# Patient Record
Sex: Female | Born: 1965 | Race: Black or African American | Hispanic: No | Marital: Single | Smoking: Never smoker
Health system: Southern US, Community
[De-identification: ages and names within clinical notes are randomized; demographics above are authoritative.]

## PROBLEM LIST (undated history)

## (undated) DIAGNOSIS — E785 Hyperlipidemia, unspecified: Secondary | ICD-10-CM

## (undated) DIAGNOSIS — K219 Gastro-esophageal reflux disease without esophagitis: Secondary | ICD-10-CM

## (undated) DIAGNOSIS — M1990 Unspecified osteoarthritis, unspecified site: Secondary | ICD-10-CM

## (undated) DIAGNOSIS — R1013 Epigastric pain: Secondary | ICD-10-CM

## (undated) DIAGNOSIS — I10 Essential (primary) hypertension: Secondary | ICD-10-CM

## (undated) DIAGNOSIS — G809 Cerebral palsy, unspecified: Secondary | ICD-10-CM

## (undated) DIAGNOSIS — T4145XA Adverse effect of unspecified anesthetic, initial encounter: Secondary | ICD-10-CM

## (undated) DIAGNOSIS — K449 Diaphragmatic hernia without obstruction or gangrene: Secondary | ICD-10-CM

## (undated) DIAGNOSIS — K5904 Chronic idiopathic constipation: Secondary | ICD-10-CM

## (undated) DIAGNOSIS — C801 Malignant (primary) neoplasm, unspecified: Secondary | ICD-10-CM

## (undated) HISTORY — PX: UPPER GASTROINTESTINAL ENDOSCOPY: SHX188

## (undated) HISTORY — DX: Diaphragmatic hernia without obstruction or gangrene: K44.9

## (undated) HISTORY — DX: Essential (primary) hypertension: I10

## (undated) HISTORY — PX: BREAST SURGERY: SHX581

## (undated) HISTORY — DX: Hyperlipidemia, unspecified: E78.5

## (undated) HISTORY — DX: Cerebral palsy, unspecified: G80.9

## (undated) HISTORY — DX: Chronic idiopathic constipation: K59.04

## (undated) HISTORY — DX: Gastro-esophageal reflux disease without esophagitis: K21.9

## (undated) HISTORY — PX: COLONOSCOPY: SHX174

## (undated) HISTORY — DX: Epigastric pain: R10.13

---

## 1997-09-07 ENCOUNTER — Other Ambulatory Visit: Admission: RE | Admit: 1997-09-07 | Discharge: 1997-09-07 | Payer: Self-pay | Admitting: Obstetrics & Gynecology

## 1999-07-10 ENCOUNTER — Other Ambulatory Visit: Admission: RE | Admit: 1999-07-10 | Discharge: 1999-07-10 | Payer: Self-pay | Admitting: Obstetrics & Gynecology

## 2000-06-26 ENCOUNTER — Other Ambulatory Visit: Admission: RE | Admit: 2000-06-26 | Discharge: 2000-06-26 | Payer: Self-pay | Admitting: Obstetrics and Gynecology

## 2000-09-25 ENCOUNTER — Ambulatory Visit (HOSPITAL_COMMUNITY): Admission: RE | Admit: 2000-09-25 | Discharge: 2000-09-25 | Payer: Self-pay | Admitting: *Deleted

## 2000-09-25 ENCOUNTER — Encounter: Payer: Self-pay | Admitting: *Deleted

## 2000-11-27 ENCOUNTER — Encounter: Payer: Self-pay | Admitting: Internal Medicine

## 2000-11-27 ENCOUNTER — Ambulatory Visit (HOSPITAL_COMMUNITY): Admission: RE | Admit: 2000-11-27 | Discharge: 2000-11-27 | Payer: Self-pay | Admitting: Internal Medicine

## 2001-02-04 ENCOUNTER — Ambulatory Visit (HOSPITAL_COMMUNITY): Admission: RE | Admit: 2001-02-04 | Discharge: 2001-02-04 | Payer: Self-pay | Admitting: *Deleted

## 2001-02-04 ENCOUNTER — Encounter: Payer: Self-pay | Admitting: *Deleted

## 2001-09-22 ENCOUNTER — Ambulatory Visit (HOSPITAL_COMMUNITY): Admission: RE | Admit: 2001-09-22 | Discharge: 2001-09-22 | Payer: Self-pay | Admitting: Internal Medicine

## 2001-09-22 ENCOUNTER — Encounter: Payer: Self-pay | Admitting: Internal Medicine

## 2001-10-08 ENCOUNTER — Ambulatory Visit (HOSPITAL_COMMUNITY): Admission: RE | Admit: 2001-10-08 | Discharge: 2001-10-08 | Payer: Self-pay | Admitting: Internal Medicine

## 2001-10-27 ENCOUNTER — Other Ambulatory Visit: Admission: RE | Admit: 2001-10-27 | Discharge: 2001-10-27 | Payer: Self-pay | Admitting: Obstetrics and Gynecology

## 2001-12-18 ENCOUNTER — Encounter: Payer: Self-pay | Admitting: Obstetrics and Gynecology

## 2001-12-18 ENCOUNTER — Encounter (INDEPENDENT_AMBULATORY_CARE_PROVIDER_SITE_OTHER): Payer: Self-pay | Admitting: Specialist

## 2001-12-18 ENCOUNTER — Ambulatory Visit (HOSPITAL_COMMUNITY): Admission: RE | Admit: 2001-12-18 | Discharge: 2001-12-18 | Payer: Self-pay | Admitting: Obstetrics and Gynecology

## 2002-11-01 ENCOUNTER — Other Ambulatory Visit: Admission: RE | Admit: 2002-11-01 | Discharge: 2002-11-01 | Payer: Self-pay | Admitting: Obstetrics and Gynecology

## 2002-12-21 ENCOUNTER — Ambulatory Visit (HOSPITAL_COMMUNITY): Admission: RE | Admit: 2002-12-21 | Discharge: 2002-12-21 | Payer: Self-pay | Admitting: Internal Medicine

## 2003-12-01 ENCOUNTER — Ambulatory Visit (HOSPITAL_COMMUNITY): Admission: RE | Admit: 2003-12-01 | Discharge: 2003-12-01 | Payer: Self-pay | Admitting: Internal Medicine

## 2004-02-16 ENCOUNTER — Other Ambulatory Visit: Admission: RE | Admit: 2004-02-16 | Discharge: 2004-02-16 | Payer: Self-pay | Admitting: Obstetrics and Gynecology

## 2004-02-23 ENCOUNTER — Ambulatory Visit (HOSPITAL_COMMUNITY): Admission: RE | Admit: 2004-02-23 | Discharge: 2004-02-23 | Payer: Self-pay | Admitting: Obstetrics and Gynecology

## 2004-03-22 ENCOUNTER — Emergency Department (HOSPITAL_COMMUNITY): Admission: EM | Admit: 2004-03-22 | Discharge: 2004-03-22 | Payer: Self-pay | Admitting: Emergency Medicine

## 2004-08-01 ENCOUNTER — Ambulatory Visit: Payer: Self-pay | Admitting: Internal Medicine

## 2004-08-03 ENCOUNTER — Ambulatory Visit (HOSPITAL_COMMUNITY): Admission: RE | Admit: 2004-08-03 | Discharge: 2004-08-03 | Payer: Self-pay | Admitting: Internal Medicine

## 2004-08-03 ENCOUNTER — Ambulatory Visit: Payer: Self-pay | Admitting: Internal Medicine

## 2004-10-31 ENCOUNTER — Ambulatory Visit: Payer: Self-pay | Admitting: Internal Medicine

## 2005-02-18 ENCOUNTER — Other Ambulatory Visit: Admission: RE | Admit: 2005-02-18 | Discharge: 2005-02-18 | Payer: Self-pay | Admitting: Obstetrics and Gynecology

## 2005-03-01 ENCOUNTER — Ambulatory Visit (HOSPITAL_COMMUNITY): Admission: RE | Admit: 2005-03-01 | Discharge: 2005-03-01 | Payer: Self-pay | Admitting: Internal Medicine

## 2005-05-13 DIAGNOSIS — K219 Gastro-esophageal reflux disease without esophagitis: Secondary | ICD-10-CM

## 2005-05-13 HISTORY — DX: Gastro-esophageal reflux disease without esophagitis: K21.9

## 2005-08-23 ENCOUNTER — Ambulatory Visit: Payer: Self-pay | Admitting: Internal Medicine

## 2005-08-27 ENCOUNTER — Ambulatory Visit (HOSPITAL_COMMUNITY): Admission: RE | Admit: 2005-08-27 | Discharge: 2005-08-27 | Payer: Self-pay | Admitting: Internal Medicine

## 2006-01-10 ENCOUNTER — Emergency Department (HOSPITAL_COMMUNITY): Admission: EM | Admit: 2006-01-10 | Discharge: 2006-01-10 | Payer: Self-pay | Admitting: Emergency Medicine

## 2007-06-19 ENCOUNTER — Ambulatory Visit: Payer: Self-pay | Admitting: Gastroenterology

## 2007-06-25 ENCOUNTER — Ambulatory Visit: Payer: Self-pay | Admitting: Gastroenterology

## 2007-06-25 ENCOUNTER — Ambulatory Visit (HOSPITAL_COMMUNITY): Admission: RE | Admit: 2007-06-25 | Discharge: 2007-06-25 | Payer: Self-pay | Admitting: Gastroenterology

## 2007-06-25 ENCOUNTER — Encounter: Payer: Self-pay | Admitting: Gastroenterology

## 2007-08-11 ENCOUNTER — Ambulatory Visit: Payer: Self-pay | Admitting: Gastroenterology

## 2007-08-19 ENCOUNTER — Ambulatory Visit (HOSPITAL_COMMUNITY): Admission: RE | Admit: 2007-08-19 | Discharge: 2007-08-19 | Payer: Self-pay | Admitting: Gastroenterology

## 2007-08-19 ENCOUNTER — Encounter: Payer: Self-pay | Admitting: Gastroenterology

## 2007-08-19 ENCOUNTER — Ambulatory Visit: Payer: Self-pay | Admitting: Gastroenterology

## 2007-09-30 ENCOUNTER — Emergency Department (HOSPITAL_COMMUNITY): Admission: EM | Admit: 2007-09-30 | Discharge: 2007-09-30 | Payer: Self-pay | Admitting: Emergency Medicine

## 2007-12-23 ENCOUNTER — Ambulatory Visit: Payer: Self-pay | Admitting: Gastroenterology

## 2008-04-28 ENCOUNTER — Ambulatory Visit: Payer: Self-pay | Admitting: Gastroenterology

## 2008-11-15 ENCOUNTER — Encounter: Payer: Self-pay | Admitting: Gastroenterology

## 2008-11-23 DIAGNOSIS — K219 Gastro-esophageal reflux disease without esophagitis: Secondary | ICD-10-CM | POA: Insufficient documentation

## 2008-11-23 DIAGNOSIS — K449 Diaphragmatic hernia without obstruction or gangrene: Secondary | ICD-10-CM

## 2008-11-23 DIAGNOSIS — K5900 Constipation, unspecified: Secondary | ICD-10-CM

## 2008-11-23 DIAGNOSIS — R1310 Dysphagia, unspecified: Secondary | ICD-10-CM

## 2008-11-23 DIAGNOSIS — I10 Essential (primary) hypertension: Secondary | ICD-10-CM | POA: Insufficient documentation

## 2008-11-23 DIAGNOSIS — G809 Cerebral palsy, unspecified: Secondary | ICD-10-CM

## 2008-11-23 HISTORY — DX: Diaphragmatic hernia without obstruction or gangrene: K44.9

## 2008-11-24 ENCOUNTER — Ambulatory Visit: Payer: Self-pay | Admitting: Gastroenterology

## 2009-02-13 ENCOUNTER — Encounter (HOSPITAL_COMMUNITY): Admission: RE | Admit: 2009-02-13 | Discharge: 2009-03-15 | Payer: Self-pay | Admitting: Internal Medicine

## 2009-05-11 ENCOUNTER — Encounter (INDEPENDENT_AMBULATORY_CARE_PROVIDER_SITE_OTHER): Payer: Self-pay | Admitting: *Deleted

## 2009-07-11 ENCOUNTER — Ambulatory Visit: Payer: Self-pay | Admitting: Gastroenterology

## 2009-07-11 DIAGNOSIS — K089 Disorder of teeth and supporting structures, unspecified: Secondary | ICD-10-CM | POA: Insufficient documentation

## 2009-09-13 ENCOUNTER — Ambulatory Visit: Payer: Self-pay | Admitting: Gastroenterology

## 2009-09-13 DIAGNOSIS — K921 Melena: Secondary | ICD-10-CM

## 2009-09-13 DIAGNOSIS — R1013 Epigastric pain: Secondary | ICD-10-CM | POA: Insufficient documentation

## 2009-09-14 ENCOUNTER — Encounter: Payer: Self-pay | Admitting: Gastroenterology

## 2009-09-14 ENCOUNTER — Ambulatory Visit: Payer: Self-pay | Admitting: Gastroenterology

## 2009-09-27 LAB — CONVERTED CEMR LAB
Basophils Absolute: 0.1 10*3/uL (ref 0.0–0.1)
Basophils Relative: 1 % (ref 0–1)
Eosinophils Absolute: 0.1 10*3/uL (ref 0.0–0.7)
Eosinophils Relative: 1 % (ref 0–5)
HCT: 39.3 % (ref 36.0–46.0)
Hemoglobin: 13.1 g/dL (ref 12.0–15.0)
Lymphocytes Relative: 35 % (ref 12–46)
Lymphs Abs: 2.8 10*3/uL (ref 0.7–4.0)
MCHC: 33.3 g/dL (ref 30.0–36.0)
MCV: 91.6 fL (ref 78.0–100.0)
Monocytes Absolute: 0.5 10*3/uL (ref 0.1–1.0)
Monocytes Relative: 6 % (ref 3–12)
Neutro Abs: 4.5 10*3/uL (ref 1.7–7.7)
Neutrophils Relative %: 57 % (ref 43–77)
Platelets: 382 10*3/uL (ref 150–400)
RBC: 4.29 M/uL (ref 3.87–5.11)
RDW: 13.6 % (ref 11.5–15.5)
WBC: 8 10*3/uL (ref 4.0–10.5)

## 2009-11-21 ENCOUNTER — Ambulatory Visit: Payer: Self-pay | Admitting: Gastroenterology

## 2009-11-21 DIAGNOSIS — D126 Benign neoplasm of colon, unspecified: Secondary | ICD-10-CM

## 2009-11-21 DIAGNOSIS — R109 Unspecified abdominal pain: Secondary | ICD-10-CM | POA: Insufficient documentation

## 2009-11-22 ENCOUNTER — Encounter: Payer: Self-pay | Admitting: Gastroenterology

## 2009-11-22 LAB — CONVERTED CEMR LAB
BUN: 14 mg/dL (ref 6–23)
Creatinine, Ser: 0.62 mg/dL (ref 0.40–1.20)

## 2009-11-24 ENCOUNTER — Encounter (INDEPENDENT_AMBULATORY_CARE_PROVIDER_SITE_OTHER): Payer: Self-pay | Admitting: *Deleted

## 2009-12-04 ENCOUNTER — Telehealth (INDEPENDENT_AMBULATORY_CARE_PROVIDER_SITE_OTHER): Payer: Self-pay

## 2009-12-04 ENCOUNTER — Encounter: Payer: Self-pay | Admitting: Gastroenterology

## 2009-12-04 DIAGNOSIS — R197 Diarrhea, unspecified: Secondary | ICD-10-CM

## 2009-12-14 ENCOUNTER — Telehealth (INDEPENDENT_AMBULATORY_CARE_PROVIDER_SITE_OTHER): Payer: Self-pay

## 2009-12-22 ENCOUNTER — Encounter: Payer: Self-pay | Admitting: Gastroenterology

## 2010-02-01 ENCOUNTER — Ambulatory Visit: Payer: Self-pay | Admitting: Gastroenterology

## 2010-03-14 ENCOUNTER — Encounter (INDEPENDENT_AMBULATORY_CARE_PROVIDER_SITE_OTHER): Payer: Self-pay | Admitting: *Deleted

## 2010-03-20 ENCOUNTER — Ambulatory Visit: Payer: Self-pay | Admitting: Gastroenterology

## 2010-04-18 ENCOUNTER — Encounter (HOSPITAL_COMMUNITY)
Admission: RE | Admit: 2010-04-18 | Discharge: 2010-05-18 | Payer: Self-pay | Source: Home / Self Care | Attending: Gastroenterology | Admitting: Gastroenterology

## 2010-06-03 ENCOUNTER — Encounter: Payer: Self-pay | Admitting: Gastroenterology

## 2010-06-03 ENCOUNTER — Encounter (INDEPENDENT_AMBULATORY_CARE_PROVIDER_SITE_OTHER): Payer: Self-pay | Admitting: Internal Medicine

## 2010-06-12 NOTE — Letter (Signed)
Summary: letter of appeal for CT  letter of appeal for CT   Imported By: Minna Merritts 11/22/2009 15:51:29  _____________________________________________________________________  External Attachment:    Type:   Image     Comment:   External Document

## 2010-06-12 NOTE — Assessment & Plan Note (Signed)
Summary: [name],[name]   Visit Type:  Follow-up Visit Primary Care Provider:  Felecia Shelling, M.D.  Chief Complaint:  F/U gerd/ dysphagia.  History of Present Illness: c/o chest and abd pain. Started on her Feb 10-sharp, chest and upr abd pain-sometimes feels onec a week. Not using TUMS or Maalox. HYQ:MVHQ. Has a sore in mouth. Drinking soft drinks. Can't eat cause she had a sore in her mouth for about a week. No cigs.   Current Medications (verified): 1)  Polyethylene Glycol 3350  Powd (Polyethylene Glycol 3350) .Marland KitchenMarland KitchenMarland Kitchen 17 Grams By Mouth Daily Prn 2)  Lisinopril 2.5 Mg Tabs (Lisinopril) .... Take 1/2 Tab Every Day 3)  Prilosec 20 Mg Cpdr (Omeprazole) .Marland Kitchen.. 1 By Mouth 30 Minutes Before Meals, Bid 4)  Cyclobenzaprine Hcl 10 Mg Tabs (Cyclobenzaprine Hcl) .... As Needed  Allergies (verified): No Known Drug Allergies  Past History:  Past Medical History: Last updated: 11/24/2008 TCS 2009: Simple adenoma GERD Cerebral palsy Consitipation HTN Hiatal Hernia EGD 2004/2006: 56 Fr Maloney dilation-no disruption EGD 2003: 54 Fr Maloney dilation-no disruption  Vital Signs:  Patient profile:   45 year old female Temp:     98.2 degrees F oral Pulse rate:   64 / minute BP sitting:   138 / 80  (left arm) Cuff size:   regular  Vitals Entered By: Cloria Spring LPN (July 12, 4694 2:56 PM)  Physical Exam  General:  Well developed, well nourished, no acute distress. Head:  Normocephalic and atraumatic. Eyes:  PERRLA, no icterus. Mouth:  poor dentition. impacted left lower wisdom tooth?   Lungs:  Clear throughout to auscultation. Heart:  Regular rate and rhythm; no murmurs Abdomen:  Soft, nontender and nondistended. Normal bowel sounds.  Impression & Recommendations:  Problem # 1:  GERD (ICD-530.81) Assessment Improved  Having mild brekthrough Sx associated most likely with dietary nonadherence. Add Viscous Lidocaine/Maalox/Mylanta as needed. Continue two times a day OMP. OPV in 2 mos. Could  consider addition of Baclofen if Sx worsen or repeat EGD.   Orders: Est. Patient Level IV (29528)  Problem # 2:  DENTAL PAIN (ICD-525.9) Assessment: New Pt likely has impacted wisdom tooth on left lower jaw. Pt should see Dr. Eber Jones Long ASAP to address issue.  CC: PCP Prescriptions: LIDOCAINE VISCOUS 2 % SOLN (LIDOCAINE HCL) 2 tsp with 4 tsp Maalox or Mylanta for breakthrough heartburn/indigestion BID  #200 ml x 5   Entered and Authorized by:   West Bali MD   Signed by:   West Bali MD on 07/11/2009   Method used:   [facility]ron[facility] to        [facility] [phone][facility] (retail)       [address]       [place], [place]  [address]       Ph: [phone] or [phone]       Fax: [phone]   RxID:   [id number]

## 2010-06-12 NOTE — Letter (Signed)
Summary: insurance denial  insurance denial   Imported By: Rosine Beat 12/22/2009 13:42:48  _____________________________________________________________________  External Attachment:    Type:   Image     Comment:   External Document

## 2010-06-12 NOTE — Assessment & Plan Note (Signed)
Summary: ABD  AND CHEST PAIN   Visit Type:  Follow-up Visit Primary Care Provider:  Felecia Shelling, M.D.  Chief Complaint:  follow up- doing good.  History of Present Illness: Still hurts in chest and stomach more than 1-2x/week. Not related to food. A little better than last time. No diarrhea or constipation.  No cough. Swallowing is alright. Nl BM every day. No milk, ice cream, or cheese. Eating yogurt daily for 1-2 weeks. No black sticky stools. Taking Prilosec two times a day. Pain is all over: twisting. Viscous Lidocaine helps pain ease off. c/o lower abd pain today. Will start vaginal suppositories for vaginal discharge.  Current Medications (verified): 1)  Polyethylene Glycol 3350  Powd (Polyethylene Glycol 3350) .Marland KitchenMarland KitchenMarland Kitchen 17 Grams By Mouth Daily Prn 2)  Lisinopril 2.5 Mg Tabs (Lisinopril) .... Take 1/2 Tab Every Day 3)  Prilosec 20 Mg Cpdr (Omeprazole) .Marland Kitchen.. 1 By Mouth 30 Minutes Before Meals, Bid 4)  Cyclobenzaprine Hcl 10 Mg Tabs (Cyclobenzaprine Hcl) .... As Needed 5)  Lidocaine Viscous 2 % Soln (Lidocaine Hcl) .... 2 Tsp With 4 Tsp Maalox or Mylanta For Breakthrough Heartburn/indigestion Bid  Allergies (verified): No Known Drug Allergies  Past History:  Past Medical History: TCS 2009: Simple adenoma GERD Cerebral palsy Constipation HTN Hiatal Hernia EGD 2/09-->Normal esophagus, multiple gastric polyps seen in the stomach, bx benign.  EGD 2004/2006: 56 Fr Maloney dilation-no disruption EGD 2003: 54 Fr Maloney dilation-no disruption  Family History: Reviewed history from 09/13/2009 and no changes required. Mother with h/o CRC diagnosed in late 68s. She also had kidney transplant. Father deceased due to accident.  Vital Signs:  Patient profile:   45 year old female Temp:     98.2 degrees F oral Pulse rate:   72 / minute BP sitting:   128 / 80  (left arm) Cuff size:   regular  Vitals Entered By: Hendricks Limes LPN (November 21, 2009 8:58 AM)  Physical Exam  General:  Well  developed, well nourished, no acute distress. Head:  Normocephalic and atraumatic. Mouth:  No deformity or lesions. Lungs:  Clear throughout to auscultation. Heart:  Regular rate and rhythm; no murmurs. Abdomen:  Soft, nontender and nondistended. Normal bowel sounds.  Impression & Recommendations:  Problem # 1:  EPIGASTRIC PAIN (ICD-789.06) 2o to lactose intolerance and/or IBS-c. Avoid dairy. Lactose free diet HO given. Add Probiotics daily. Samples given. Get CT Scan of abdomen and pelvis. MAY NEED TO ADD FIBER.  Return visit in 2 months.  Addendum [id number]: Dr. Melanee Spry denied pt the right to have a CT SCAN.  I will appeal his denial. I was given the FAX#: [phone] and I will write to appeal Dr. Wells Guiles denial. Appeal faxed today. Informed family that insurance denying the CT Scan. Recommended family call and complain.  Problem # 2:  GERD (ICD-530.81)  Continue Prilosec two times a day. If Sx not improved at next visit then will change to Nexium. Consider Baclofen.  CC: PCP  Orders: Est. Patient Level IV (91478)  Problem # 3:  ADENOMATOUS COLONIC POLYP (ICD-211.3) Assessment: Comment Only TCS 2014.  Other Orders: T-BUN ([id number]) T-Creatinine Blood [phone])  Patient Instructions: 1)  Avoid dairy. 2)  Continue Prilosec two times a day. 3)  Add Probiotics daily. 4)  Get CT Scan of abdomen and pelvis. 5)  Return visit in 2 months. 6)  The medication list was reviewed and reconciled.  All changed / newly prescribed medications were explained.  A complete medication list was provided  to the patient / caregiver.  Appended Document: ABD  AND CHEST PAIN pt aware of appt for 9/1 @ 10am w/SF  Appended Document: ABD  AND CHEST PAIN REMINDER APPOINTMENT IN THE COMPUTER

## 2010-06-12 NOTE — Progress Notes (Signed)
Summary: pt needs new containers for stool specimen  Phone Note Call from Patient   Caller: MS MALLOY/ CAREGIVER Summary of Call: Ms Marlynn Perking called and said between herself and the pt's sister, they got the lab order mixed up, assumed it was urine sample ( not stool sample). Requesting new containers, reviewed instructions again with her. Containers will be at the front desk. She will try to get sample today, tomorrow and Sat. Said pt is still having alot of abd pain.Marland KitchenMarland KitchenPt and her sister have discussed whether to do CT and pay....but she thinks they have decided to find out the results of the stool studies first. Per Ms Marlynn Perking, pt says her diarrhea is some better. She wants to know if she should try more solid foods now? Initial call taken by: Cloria Spring LPN,  December 14, 2009 11:21 AM

## 2010-06-12 NOTE — Letter (Signed)
Summary: Recall Office Visit  [facility]  [address]   [place], [place] [address]   Phone: [phone]  Fax: [phone]      March 14, 2010   [name] [address] [place], [place]  [address] 03-Oct-1965   Dear [name],   According to our records, it is time for you to schedule a follow-up office visit with [place].   At your convenience, please call [phone] to schedule an office visit. If you have any questions, concerns, or feel that this letter is in error, we would appreciate your call.   Sincerely,    Diana Eves  [facility] Ph: [phone]   Fax: [phone]

## 2010-06-12 NOTE — Assessment & Plan Note (Signed)
Summary: DROPPED OFF STOOL/SS  pt returned ifobt and it was negative  Allergies: No Known Drug Allergies  Other Orders: Immuno-chemical Fecal Occult ([id number]) 

## 2010-06-12 NOTE — Assessment & Plan Note (Signed)
Summary: DIARRHEA, GERD   Visit Type:  Follow-up Visit Primary Care Provider:  Felecia Shelling, M.D.  Chief Complaint:  F/U gerd/diarrhea.  History of Present Illness: Having 2 BMs a day-nl formed. No pain with pasing stool. Avoiding dairy and fried foods. Maybe 1-2x/wk has belly pain. Appetite: good. Heartburn: usu in the AM.   Current Medications (verified): 1)  Lisinopril 2.5 Mg Tabs (Lisinopril) .... Take 1/2 Tab Every Day 2)  Prilosec 20 Mg Cpdr (Omeprazole) .Marland Kitchen.. 1 By Mouth 30 Minutes Before Meals, Bid 3)  Cyclobenzaprine Hcl 10 Mg Tabs (Cyclobenzaprine Hcl) .... As Needed 4)  Dicyclomine Hcl 10 Mg Caps (Dicyclomine Hcl) .... Take 1 Tablet By Mouth Three Times A Day 30 Minutes Prior To Meals  Allergies (verified): No Known Drug Allergies  Past History:  Past Medical History: Last updated: 02/01/2010 GERD **EGD 2/09-->Normal esophagus, multiple gastric polyps seen in the stomach, bx benign.  DYSPHAGIA **EGD 2004/2006: 56 Fr Maloney dilation-no disruption, EGD 2003: 54 Fr Maloney dilation-no disruption Intermittent Constipation TCS 2009: Simple adenoma  Hiatal Hernia Cerebral palsy HTN  Past Surgical History: Last updated: 09/13/2009 Unremarkable  Family History: Last updated: 09/13/2009 Mother with h/o CRC diagnosed in late 41s. She also had kidney transplant. Father deceased due to accident.  Social History: Last updated: 09/13/2009 Lives with mother. She has caretaker ATC. Denies tob, alcohol, drug use.  Vital Signs:  Patient profile:   45 year old female Temp:     97.3 degrees F oral Pulse rate:   80 / minute BP sitting:   100 / 70  (left arm)  Physical Exam  General:  Well developed, well nourished, no acute distress. Head:  Normocephalic and atraumatic. Lungs:  Clear throughout to auscultation. Heart:  Regular rate and rhythm; no murmurs. Abdomen:  Soft, nontender and nondistended. Normal bowel sounds. PT EXAMINED IN WHEELCHAIR.  Impression &  Recommendations:  Problem # 1:  DIARRHEA (ICD-787.91) Assessment Improved 2o to lactose intolerance +/- IBS-d. Continue to avoid dairy. Use lacatse if dairy is consumed. Continue Dicyclomine three times a day. If you start to have hard stool or constipation, take only twice daily. Follow up in 6 mos.  Problem # 2:  GERD (ICD-530.81) Assessment: Improved Contiue to avoid fried foods. Continue Omeprazole two times a day 30 minutes before meals. Add 1-2 TUMS at bedtime to help with AM acid.  CC: PCP  Patient Instructions: 1)  Continue Omeprazole two times a day 30 minutes before meals. 2)  Add 1-2 TUMS at bedtime to help with AM acid. 3)  Continue Dicyclomine hre times a day. If you start to have hard stool or constipation, take only twice daily. 4)  Follow up in 6 mos. 5)  The medication list was reviewed and reconciled.  All changed / newly prescribed medications were explained.  A complete medication list was provided to the patient / caregiver. Prescriptions: MOTORIZED WHEELCHAIR use as directed  #1 x 0   Entered and Authorized by:   West Bali MD   Signed by:   West Bali MD on 03/20/2010   Method used:   Print then Give to Patient   RxID:   [id number]   Appended Document: Orders Update    Clinical Lists Changes  Orders: Added new Service order of Est. Patient Level III (23557) - Signed      Appended Document: DIARRHEA, GERD 6 MONTH F/U OPV IS IN THE COMPUTER

## 2010-06-12 NOTE — Letter (Signed)
Summary: CT order  Internal Other   Imported By: Peggyann Shoals 11/21/2009 09:49:53  _____________________________________________________________________  External Attachment:    Type:   Image     Comment:   External Document

## 2010-06-12 NOTE — Miscellaneous (Signed)
Summary: Orders Update  Clinical Lists Changes  Problems: Added new problem of DIARRHEA (ICD-787.91) Orders: Added new Test order of T-Stool Giardia / Crypto- EIA (09811) - Signed Added new Test order of T-Fecal WBC ([id number]) - Signed Added new Test order of T-Culture, C-Diff Toxin A/B ([id number]) - Signed Added new Test order of T-Culture, C-Diff Toxin A/B ([id number]) - Signed Added new Test order of T-Culture, C-Diff Toxin A/B ([id number]) - Signed

## 2010-06-12 NOTE — Progress Notes (Signed)
Summary: phone note/abd/chest pain and diarrhea  Phone Note Call from Patient   Caller: Mrs. Malloy/ Caregiver Summary of Call: Received T/C from Mrs. Malloy, said pt is having some more abd pain. Had a bad episode on Sun with abd/chest pain... got very hot and dizzy and had some diarrhea. Wants to know what to do now, insurance would not cover to have CT. Please advise! Initial call taken by: Cloria Spring LPN,  December 04, 2009 9:15 AM     Appended Document: phone note/abd/chest pain and diarrhea please call pt. She may be having an episode of acute gastroenteritis. Pt needs to follow a soft low residue/lactose free diet for 7 days. They may pick up a HO. Submit loose stool for CDIffx3, fecal lactoferrin, and Giardia Ag. May use Imodium as needed for abd cramps and diarrhea. No more than 3 pills a day. Drink 1.5Ls of water daily. Will re-order CT Scan, Dx: severe abd pain and diarrhea.  Appended Document: phone note/abd/chest pain and diarrhea Tylenol or Ibuprofen OTC 200 mg qid prn abd pain.  Appended Document: phone note/abd/chest pain and diarrhea Called care-giver @ [phone]...and got Keryn's Mom. She said caregiver is gone and will not return til about 3:00 PM today. I gave her all of the information and told her to have Mrs. Malloy call me and i will explain to her also. Orders/ stool containers and diet at front for pick-up.  Appended Document: phone note/abd/chest pain and diarrhea LMOM for Mrs. Malloy to call.  Appended Document: phone note/abd/chest pain and diarrhea LMOM for Ms. Malloy to call.  Appended Document: phone note/abd/chest pain and diarrhea Spoke to Mrs Marlynn Perking. She will come by Wed AM to pick up containers, etc. She said pt describes her pain as very severe when it hits hurt kind of suddenly....then it wears down for awhile.  Appended Document: phone note/abd/chest pain and diarrhea Left message for Mrs. Mallory to call me RE: scheduling of [place]'s CT and  insurance associated issues.  Appended Document: phone note/abd/chest pain and diarrhea [employer] rep Mrs. Katrinka Blazing told me that a new precertification for a CPT code that had been denied within the past 45 days could not be precerted again even if it was a new DX or new onset of SX.  We would need to do an appeal or a restart; I explained that we had already done that and that the pt could have developed a new issue as this was a new CT being ordered for new severe SX not associated with the first CT.  I need to speak with caregiver and in another append mentioned I left a VM for her to call back to determine if they choose to proceed.  Otherwise, the CT will not be covered this time and Dr. Darrick Penna has been made aware and awaits results of stool studies ordered in conjunction.  Appended Document: phone note/abd/chest pain and diarrhea Ms. Malloy came by this AM to pick up containers for stool samples for pt. I gave her some of the information Angelica Chessman mentioned about the CT, she would like a call from [employer] at the above number.  Appended Document: phone note/abd/chest pain and diarrhea Spoke w/ Ms Marlynn Perking about [employer] not giving preauth for second CT, just like the first.  I explained that she can discuss with family and determine to proceed, that it didn't mean she couldn't have the CT, just that [employer] would not allow [place] to preauth a second CT while there is a CPT  that has been denied can not be given.  She will get back to [place] w/ decision and will complete stool studies as ordered.  Appended Document: phone note/abd/chest pain and diarrhea Please call pt. She should still avoid dairy but may add other things to her diet. Will await stools studies.  Appended Document: phone note/abd/chest pain and diarrhea tried to call pt- NA  Appended Document: phone note/abd/chest pain and diarrhea Spoke with Ms. Malloy. Informed of the above. Pt completed the stool samples, they are  taking to the lab this  AM.

## 2010-06-12 NOTE — Assessment & Plan Note (Signed)
Summary: GERD, DIARRHEA   Visit Type:  Follow-up Visit Primary Care Elane Peabody:  Felecia Shelling, M.D.   Chief Complaint:  F/U abd pain.  History of Present Illness: Pain in middle of week. Yesterday she was hurting-[employer], [employer], french fries. No problems swallowing. Good Bms: soft bu today watery: dark green. BM: soft yesterday. No blood in stool.  No nausea or vomiting. Happening 1-2x/mo. Not eating cheese or dairy. Ice cream caused loose stools. Did not taek suppositories. Yellow stool like discharge and it's slimy.  Current Medications (verified): 1)  Polyethylene Glycol 3350  Powd (Polyethylene Glycol 3350) .Marland KitchenMarland KitchenMarland Kitchen 17 Grams By Mouth Daily Prn 2)  Lisinopril 2.5 Mg Tabs (Lisinopril) .... Take 1/2 Tab Every Day 3)  Prilosec 20 Mg Cpdr (Omeprazole) .Marland Kitchen.. 1 By Mouth 30 Minutes Before Meals, Bid 4)  Cyclobenzaprine Hcl 10 Mg Tabs (Cyclobenzaprine Hcl) .... As Needed 5)  Dicyclomine Hcl 10 Mg Caps (Dicyclomine Hcl) .... Take 1 Tablet By Mouth Two Times A Day  Allergies (verified): No Known Drug Allergies  Past History:  Past Medical History: GERD **EGD 2/09-->Normal esophagus, multiple gastric polyps seen in the stomach, bx benign.  DYSPHAGIA **EGD 2004/2006: 56 Fr Maloney dilation-no disruption, EGD 2003: 54 Fr Maloney dilation-no disruption Intermittent Constipation TCS 2009: Simple adenoma  Hiatal Hernia Cerebral palsy HTN  Review of Systems       AUG 2011: FECAL LACTOFERRIN POS, CDIFF negx3, Giardia Ag neg  Vital Signs:  Patient profile:   45 year old female Height:      62 inches Temp:     97.6 degrees F oral Pulse rate:   68 / minute BP sitting:   134 / 70  (left arm) Cuff size:   regular  Vitals Entered By: Cloria Spring LPN (February 01, 2010 9:20 AM)  Physical Exam  General:  Well developed, well nourished, no acute distress. Head:  Normocephalic and atraumatic. Lungs:  Clear throughout to auscultation. Heart:  Regular rate and rhythm; no murmurs. Abdomen:  Soft,  nontender and nondistended. Normal bowel sounds. PT EXAMINED IN WHEELCHAIR. Psych:  Alert and cooperative. Normal mood and affect.  Impression & Recommendations:  Problem # 1:  GERD (ICD-530.81) Sx not ideally controlled due to consumption of high fat food. Avoid fried foods. Continue Omeprazole. Follow up in 2 mos. Would add Baclofen if pt having Sx daily.  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 Pt should avoid dairy. If wants ice cream or dairy, give lactase with her meal. ADD PHILLIP'S COLON HEALTH DAILY. Increase to Dicyclomine to three times a day 30 minutes before meals. Use Miralax as needed. Stop using it daily. Follow up in 2 mos.  CC: PCP  Patient Instructions: 1)  Pt should avoid dairy.  2)  If wants ice cream or dairy, give lactase with her meal. 3)  Avoid fried foods. 4)  Continue Omeprazole. 5)  ADD PHILLIP'S COLON HEALTH DAILY. 6)  Increase to Dicyclomine to three times a day 30 minutes before meals. 7)  Use Miralax as needed. Stop using it daily. 8)  Follow up in 2 mos. 9)  The medication list was reviewed and reconciled.  All changed / newly prescribed medications were explained.  A complete medication list was provided to the patient / caregiver. Prescriptions: DICYCLOMINE HCL 10 MG CAPS (DICYCLOMINE HCL) Take 1 tablet by mouth three times a day 30 minutes prior to meals  #90 x 11   Entered and Authorized by:   Duncan Dull  Loreen Freud MD   Signed by:   West Bali MD on 02/01/2010   Method used:   [facility] to        [facility] [phone]* (retail)       [address]       [place], [place]  [address]       Ph: [phone] or [phone]       Fax: [phone]   RxID:   [id number]   Appended Document: GERD, DIARRHEA F/U OPV IS IN THE COMPUTER  Appended Document: Orders Update    Clinical Lists Changes  Orders: Added new Service order of Est. Patient Level IV (62831) -  Signed

## 2010-06-12 NOTE — Letter (Signed)
Summary: waiting for preauthorization  [name] was contacted by  insurance and confirmed  that in association of her insurance would take a bi t to be sure she had the MD (ordering, and reviewing) to send in clinicals or MD-toMD review for which Dr. Darrick Penna.  He further deniedthe study and we f/u as necessary to achieve results for the patient. Can see appear letter and other stages through EMR.

## 2010-06-12 NOTE — Letter (Signed)
Summary: PT ORDER  PT ORDER   Imported By: Ave Filter 03/20/2010 15:57:07  _____________________________________________________________________  External Attachment:    Type:   Image     Comment:   External Document  Appended Document: PT ORDER Pt has an appt. 04/18/10.

## 2010-06-12 NOTE — Assessment & Plan Note (Signed)
Summary: FU 2MOS,GERD,DYSPHAGIA.GU   Visit Type:  f/u Primary Care Provider:  Fanta  Chief Complaint:  follow up gerd and abd pain.  History of Present Illness: [name] is here for f/u. Last seen 3/11. She has h/o GERD, dysphagia, chest and abd pain. She was doing okay until yesterday. She started having mid-abd pain, crampy. She has small bowel movement. Denies n/v, heartburn, chest pain. Appetite is good. Denies carbonated beverages, fried/greasy foods. She is taking Viscous lidocaine with maalox or mylanta at bedtime. She is most concerned about black, sticky stools she's had the past two weeks.    Current Medications (verified): 1)  Polyethylene Glycol 3350  Powd (Polyethylene Glycol 3350) .Marland KitchenMarland KitchenMarland Kitchen 17 Grams By Mouth Daily Prn 2)  Lisinopril 2.5 Mg Tabs (Lisinopril) .... Take 1/2 Tab Every Day 3)  Prilosec 20 Mg Cpdr (Omeprazole) .Marland Kitchen.. 1 By Mouth 30 Minutes Before Meals, Bid 4)  Cyclobenzaprine Hcl 10 Mg Tabs (Cyclobenzaprine Hcl) .... As Needed 5)  Lidocaine Viscous 2 % Soln (Lidocaine Hcl) .... 2 Tsp With 4 Tsp Maalox or Mylanta For Breakthrough Heartburn/indigestion Bid  Allergies (verified): No Known Drug Allergies  Past History:  Past Medical History: TCS 2009: Simple adenoma GERD Cerebral palsy Consitipation HTN Hiatal Hernia EGD 2/09-->Normal esophagus, multiple gastric polyps seen in the stomach, bx benign.  EGD 2004/2006: 56 Fr Maloney dilation-no disruption EGD 2003: 54 Fr Maloney dilation-no disruption  Past Surgical History: Unremarkable  Family History: Mother with h/o CRC diagnosed in late 41s. She also had kidney transplant. Father deceased due to accident.  Social History: Lives with mother. She has caretaker ATC. Denies tob, alcohol, drug use.  Review of Systems General:  Denies fever, chills, sweats, anorexia, fatigue, weakness, and weight loss. Eyes:  Denies vision loss. ENT:  Denies nasal congestion, sore throat, hoarseness, and difficulty  swallowing. CV:  Denies chest pains, angina, palpitations, dyspnea on exertion, and peripheral edema. Resp:  Denies dyspnea at rest, dyspnea with exercise, and cough. GI:  See HPI. GU:  Denies urinary burning and blood in urine. MS:  Denies joint pain / LOM. Derm:  Denies rash and itching. Neuro:  Denies weakness, frequent headaches, memory loss, and confusion. Psych:  Denies depression and anxiety. Endo:  Denies unusual weight change. Heme:  Denies bruising and bleeding. Allergy:  Denies hives and rash.  Vital Signs:  Patient profile:   45 year old female Temp:     98.2 degrees F oral Pulse rate:   68 / minute BP sitting:   118 / 80  (right arm) Cuff size:   regular  Vitals Entered By: Hendricks Limes LPN (Sep 13, 1608 2:40 PM)  Physical Exam  General:  Well developed, well nourished, no acute distress. Head:  Normocephalic and atraumatic. Eyes:  Conjunctivae pink, no scleral icterus.  Mouth:  Oropharyngeal mucosa moist, pink.  No lesions, erythema or exudate.    Neck:  Supple; no masses or thyromegaly. Lungs:  Clear throughout to auscultation. Heart:  Regular rate and rhythm; no murmurs, rubs,  or bruits. Abdomen:  Soft, nontender and nondistended. Normal bowel sounds. Extremities:  No clubbing, cyanosis, edema or deformities noted. Neurologic:  Alert and oriented. Skin:  Intact without significant lesions or rashes. Psych:  Alert and cooperative. Normal mood and affect.  Impression & Recommendations:  Problem # 1:  GERD (ICD-530.81)  Breakthrough symptoms better with dietary changes. She continues to have intermittent vague mid-abdominal pain. Now with c/o black sticky stools for two weeks. Clinically no evidence of significant  anemia. Some Maalox products can cause stools to turn black and it is not clear if her viscous lidocaine is mixed with Maalox or Mylanta. Will check CBC and ifobt. Last EGD in 2009. Will decide if she needs repeat EGD based on findings. Caretaker is  aware to look for malodorous black stools, fatigue, SOB, etc.    Orders: Est. Patient Level III (16109)  Other Orders: T-CBC w/Diff ([id number]) T-CBC w/Diff ([id number])

## 2010-09-11 ENCOUNTER — Other Ambulatory Visit: Payer: Self-pay | Admitting: Family Medicine

## 2010-09-14 ENCOUNTER — Ambulatory Visit (HOSPITAL_COMMUNITY)
Admission: RE | Admit: 2010-09-14 | Discharge: 2010-09-14 | Disposition: A | Discharge status: Home / Self Care | Payer: PRIVATE HEALTH INSURANCE | Source: Ambulatory Visit | Attending: Family Medicine | Admitting: Family Medicine

## 2010-09-14 DIAGNOSIS — R109 Unspecified abdominal pain: Secondary | ICD-10-CM | POA: Insufficient documentation

## 2010-09-20 ENCOUNTER — Ambulatory Visit: Payer: PRIVATE HEALTH INSURANCE | Admitting: Gastroenterology

## 2010-09-25 NOTE — H&P (Signed)
NAMECORAYMA, CASHATT                  ACCOUNT NO.:  [id number]   MEDICAL RECORD NO.:  [id number]           PATIENT TYPE:  AMB   LOCATION:  DAY                           FACILITY:  [facility]   PHYSICIAN:  Kassie Mends, M.D.      DATE OF BIRTH:  23-Sep-1965   DATE OF ADMISSION:  06/19/2007  DATE OF DISCHARGE:  LH                              HISTORY & PHYSICAL   CHIEF COMPLAINT:  Difficulty swallowing.   HISTORY OF PRESENT ILLNESS:  [name] is a pleasant 45 year old lady  with a  history of cerebral palsy who presents for further evaluation of  dysphagia.  She states she has increasing difficulty swallowing just  about all solids and pills.  She really does not perceive any problems  with liquids.  She also has chronic GERD which is somewhat refractory at  times.  he is currently on AcipHex 20 mg daily. She has chronic  constipation.  She only has a bowel movement when she takes Colon  Cleanse which is usually about three times a week.  She states her  stools are pretty dark.  She really does not describe them as black and  tarry however.  No blood in the stool.  She generally has a bowel  movement three times a week.  She complains of some epigastric pain.   CURRENT MEDICATIONS:  1. Aciphex 20 mg daily.  2. Colon Cleanse 3 tablets t.i.d.  3. Multivitamin daily.  4. Vitamin B6 daily.  5. Vitamin B12 daily.  6. Lisinopril daily.   ALLERGIES:  No known drug allergies.   PAST MEDICAL HISTORY:  Chronic GERD, cerebral palsy, hypertension. She  has had multiple EGDs for dysphagia. Back in 2003, she had a small  sliding hiatal hernia with serrated or wavy GE junction. The esophagus  was empirically dilated with a 54 [place] Maloney dilator. She had  another EGD in 2004 with similar findings. The esophagus dilated with 56  French Maloney dilator. Last EGD March 2006. She had a small sliding  hiatal hernia, again with serrated GE junction. A 56 French Maloney  dilator passed but did not induce any  mucosal disruption of the  esophagus. In the past, she has received dilatation to help her  symptoms.   PAST SURGICAL HISTORY:  Negative.   FAMILY HISTORY:  Mother is 31.  She has a history of colorectal cancer  diagnosed in her late 84s.  She also has had a kidney transplant. Father  deceased secondary to accident.   SOCIAL HISTORY:  She lives with her mother. She has a caretaker around-  the-clock. She is disabled.  Denies tobacco, alcohol or drug use.   REVIEW OF SYSTEMS:  See HPI for GI. CONSTITUTIONAL:  No weight loss.  Cardiopulmonary: No chest pain or shortness of breath. GENITOURINARY:  No dysuria or hematuria.   PHYSICAL EXAM:  Unable to obtain. This patient is in a wheelchair.  Blood pressure 138/96, pulse 72, temperature 98.6.  GENERAL:  Pleasant, well-nourished, well-developed, black female in no  acute distress.  She does have some involuntary tremors.  She is  wheelchair bound.  SKIN:  Warm and dry.  No jaundice.  HEENT: Sclerae nonicteric.  Oropharyngeal mucosa moist and pink.  No  lesions, erythema or exudate.  CHEST:  Lungs are clear auscultation.  CARDIAC:  Reveals a regular rate and rhythm.  ABDOMEN:  Positive bowel sounds, soft, nontender, nondistended.  No  organomegaly or masses.  No rebound or guarding.  No abdominal bruits or  hernias appreciated but limited due to being examined in the wheelchair.  LOWER EXTREMITIES:  No edema.   IMPRESSION:  Ms. Toda is a 45 year old lady with cerebral palsy who  presents with recurrent esophageal dysphagia as well as refractory GERD  symptoms.  She also complains of epigastric pain.  She has chronic  constipation. She has never had a colonoscopy.  She needs to have one  for high-risk screening in her mid to late 68s based on family history.   PLAN:  1. EGD with esophageal dilatation in the near future with Dr. Cira Servant.  2. Trial of Prevacid 30 mg daily #30 provided.  She will stop AcipHex.  3. Trial of MiraLax 17 grams  daily as needed for constipation.  4. Hemoccult stool x3.      Tana Coast, P.A.      Kassie Mends, M.D.  Electronically Signed    LL/MEDQ  D:  06/19/2007  T:  06/19/2007  Job:  [id number]   cc:   Kingsley Callander. Ouida Sills, MD  Fax: [phone]

## 2010-09-25 NOTE — Assessment & Plan Note (Signed)
[name], HICKOK                   CHART#:  [id number]   DATE:  04/28/2008                       DOB:  [birth year]   PRIMARY CARE PHYSICIAN:  Tesfaye D. Felecia Shelling, MD   PROBLEM LIST:  1. Simple adenoma on colonoscopy in April 2009, in a female whose      mother was diagnosed with colon cancer at age less than 87.  2. Dysphagia likely secondary to GERD.  3. Cerebral palsy.  4. Constipation.  5. Hypertension.  6. Hiatal hernia.  7. History of esophageal dilation in 2004 and 2006.   SUBJECTIVE:  The patient is here for a followup visit.  She was last  seen in August 2009.  She is on Aciphex 20 mg b.i.d.  She states she has  breakthrough heartburn about once a week.  She denies any problem  swallowing.  She states she has had some intermittent sharp pain in her  mid chest with eating and a couple of times when she woke up in the  morning.  No associated diaphoresis or shortness of breath.  Her  caregiver states that the patient is really bad at eating fast.  She  also eats a lot of food at one time.  Most of her foods are fried or  greasy.  She also consumes a lot of fattening foods like milkshakes.  She feels that her symptoms are due to dietary indiscretions.  Previously, she did not do well on Prevacid, stating that it did not  help.  Nexium caused diarrhea.  Currently, she is having regular bowel  movements, no blood in the stool or melena.   MEDICATIONS:  1. Aciphex 20 mg b.i.d.  2. Lisinopril 2.5 mg half tablet daily.  3. MiraLax 17 g daily.   ALLERGIES:  No known drug allergies.   PHYSICAL EXAMINATION:  VITAL SIGNS:  Temp 98.1, blood pressure 120/80,  and pulse 72.  GENERAL:  A pleasant, well-nourished, well-developed female in no acute  distress.  She is in wheelchair.  She is alert and oriented.  HEENT:  Sclerae nonicteric.  Oropharyngeal mucosa moist and pink.  CHEST:  Lungs are clear to auscultation.  CARDIOVASCULAR:  Regular rate and rhythm.  No murmurs.  ABDOMEN:   Positive bowel sounds.  Abdomen is soft, nontender, and  nondistended.  Otherwise, limited exam due to patient being in  wheelchair.  LOWER EXTREMITIES:  No edema.   IMPRESSION:  Gastroesophageal reflux disease, fairly well controlled.  She has intermittent sharp fleeting pain sometimes with meals and on  occasion, she has had it in the morning when she wakes up.  She  definitely has some dietary indiscretions.  Constipation is well  controlled.  She denies any dysphagia, however.  Symptoms may be due to  breakthrough reflux.   PLAN:  1. Continue Aciphex 20 mg daily.  2. We again discussed gastroesophageal reflux disease and antireflux      measures.  I would encourage her to avoid trigger foods such as      fried or greasy foods, she needs to remain up right at least 3      hours after meals.  She also needs to eat more slowly and      thoroughly chew her food.  3. She will give [place] a call if she continues to  have these fleeting      sharp chest pains or any breakthrough reflux symptoms.  Otherwise,      office visit in 6 months with Dr. Kassie Mends.       Tana Coast, P.A.  Electronically Signed     Kassie Mends, M.D.  Electronically Signed    LL/MEDQ  D:  04/28/2008  T:  04/28/2008  Job:  [id number]   cc:   Tesfaye D. Felecia Shelling, MD

## 2010-09-25 NOTE — Consult Note (Signed)
[name], [name]                  ACCOUNT NO.:  [id number]   MEDICAL RECORD NO.:  [id number]          PATIENT TYPE:  AMB   LOCATION:  DAY                           FACILITY:  [facility]   PHYSICIAN:  Kassie Mends, M.D.      DATE OF BIRTH:  1966-01-28   DATE OF CONSULTATION:  08/11/2007  DATE OF DISCHARGE:                                 CONSULTATION   REFERRING PHYSICIAN:  Dr. Felecia Shelling.   PROBLEM LIST:  1. Dysphagia likely secondary to gastroesophageal reflux disease.  2. Cerebral palsy.  3. Constipation.  4. Hypertension.  5. Hiatal hernia.  6. History of esophageal dilation in 2004 and 2006.   SUBJECTIVE:  [name] is a 45 year old female who was seen in February  2009 due to difficulty swallowing.  She has been maintained on Aciphex  daily.  She has also complained of constipation.  Upper endoscopy showed  a normal esophagus with gastric polyps.  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  She was asked to increase Aciphex twice daily and follow with  the [facility] recommendations for reflux disease.  She is no longer  having any difficulty swallowing.  Her mother had colon cancer in her  38s.  The MiraLax has helped her bowels move.   MEDICATIONS:  1. Aciphex 20 mg b.i.d.  2. Lisinopril 1.25 mg daily.  3. MiraLax 17 g daily.   OBJECTIVE:  Temperature 98.4, blood pressure 130/88, pulse 64.  GENERAL:  She is no apparent distress, alert and interactive.  LUNGS:  Clear to  auscultation bilaterally.  CARDIOVASCULAR EXAM:  Regular rhythm, no  murmur.  ABDOMEN:  Bowel sounds are present, soft, nontender,  nondistended.   ASSESSMENT:  [name] is a 45 year old female who has gastroesophageal  reflux disease which is successfully being treated with Aciphex twice  daily.  She needs colon cancer screening for a family history of colon  cancer in a first-degree relative at age less than 23.   Thank you for allowing me to see [name] in consultation.  My  recommendations follow.   RECOMMENDATIONS:  1. We will schedule colonoscopy with propofol in April 2009 with a      Moviprep.  2. Continue gastroesophageal reflux disease recommendations and      Aciphex twice daily.  3. Follow-up appointment in 4 months.      Kassie Mends, M.D.  Electronically Signed     SM/MEDQ  D:  08/11/2007  T:  08/11/2007  Job:  [id number]   cc:   Tesfaye D. Felecia Shelling, MD  Fax: [phone]

## 2010-09-25 NOTE — H&P (Signed)
[name]                  ACCOUNT NO.:  [id number]   MEDICAL RECORD NO.:  [id number]          PATIENT TYPE:  AMB   LOCATION:  DAY                           FACILITY:  [facility]   PHYSICIAN:  Kassie Mends, M.D.      DATE OF BIRTH:  November 17, 1965   DATE OF ADMISSION:  06/19/2007  DATE OF DISCHARGE:  LH                              HISTORY & PHYSICAL   CHIEF COMPLAINT:  Difficulty swallowing.   HISTORY OF PRESENT ILLNESS:  [name] is a pleasant 45 year old lady  with a  history of cerebral palsy who presents for further evaluation of  dysphagia.  She states she has increasing difficulty swallowing just  about all solids and pills.  She really does not perceive any problems  with liquids.  She also has chronic GERD which is somewhat refractory at  times.  he is currently on AcipHex 20 mg daily. She has chronic  constipation.  She only has a bowel movement when she takes Colon  Cleanse which is usually about three times a week.  She states her  stools are pretty dark.  She really does not describe them as black and  tarry however.  No blood in the stool.  She generally has a bowel  movement three times a week.  She complains of some epigastric pain.   CURRENT MEDICATIONS:  1. Aciphex 20 mg daily.  2. Colon Cleanse 3 tablets t.i.d.  3. Multivitamin daily.  4. Vitamin B6 daily.  5. Vitamin B12 daily.  6. Lisinopril daily.   ALLERGIES:  No known drug allergies.   PAST MEDICAL HISTORY:  Chronic GERD, cerebral palsy, hypertension. She  has had multiple EGDs for dysphagia. Back in 2003, she had a small  sliding hiatal hernia with serrated or wavy GE junction. The esophagus  was empirically dilated with a 54 [place] Maloney dilator. She had  another EGD in 2004 with similar findings. The esophagus dilated with 56  French Maloney dilator. Last EGD March 2006. She had a small sliding  hiatal hernia, again with serrated GE junction. A 56 French Maloney  dilator passed but did not induce any  mucosal disruption of the  esophagus. In the past, she has received dilatation to help her  symptoms.   PAST SURGICAL HISTORY:  Negative.   FAMILY HISTORY:  Mother is 47.  She has a history of colorectal cancer  diagnosed in her late 63s.  She also has had a kidney transplant. Father  deceased secondary to accident.   SOCIAL HISTORY:  She lives with her mother. She has a caretaker around-  the-clock. She is disabled.  Denies tobacco, alcohol or drug use.   REVIEW OF SYSTEMS:  See HPI for GI. CONSTITUTIONAL:  No weight loss.  Cardiopulmonary: No chest pain or shortness of breath. GENITOURINARY:  No dysuria or hematuria.   PHYSICAL EXAM:  Unable to obtain. This patient is in a wheelchair.  Blood pressure 138/96, pulse 72, temperature 98.6.  GENERAL:  Pleasant, well-nourished, well-developed, black female in no  acute distress.  She does have some involuntary tremors. She  is  wheelchair bound.  SKIN:  Warm and dry.  No jaundice.  HEENT: Sclerae nonicteric.  Oropharyngeal mucosa moist and pink.  No  lesions, erythema or exudate.  CHEST:  Lungs are clear auscultation.  CARDIAC:  Reveals a regular rate and rhythm.  ABDOMEN:  Positive bowel sounds, soft, nontender, nondistended.  No  organomegaly or masses.  No rebound or guarding.  No abdominal bruits or  hernias appreciated but limited due to being examined in the wheelchair.  LOWER EXTREMITIES:  No edema.   IMPRESSION:  [name] is a 45 year old lady with cerebral palsy who  presents with recurrent esophageal dysphagia as well as refractory GERD  symptoms.  She also complains of epigastric pain.  She has chronic  constipation. She has never had a colonoscopy.  She needs to have one  for high-risk screening in her mid to late 66s based on family history.   PLAN:  1. EGD with esophageal dilatation in the near future with Dr. Cira Servant.  2. Trial of Prevacid 30 mg daily #30 provided.  She will stop AcipHex.  3. Trial of MiraLax 17 grams  daily as needed for constipation.  4. Hemoccult stool x3.      [name], P.A.      Kassie Mends, M.D.  Electronically Signed    LL/MEDQ  D:  06/19/2007  T:  06/19/2007  Job:  [id number]   cc:   Kingsley Callander. Ouida Sills, MD  Fax: [phone]

## 2010-09-25 NOTE — Op Note (Signed)
[name]                  ACCOUNT NO.:  [id number]   MEDICAL RECORD NO.:  [id number]          PATIENT TYPE:  AMB   LOCATION:  DAY                           FACILITY:  [facility]   PHYSICIAN:  Kassie Mends, M.D.      DATE OF BIRTH:  February 14, 1966   DATE OF PROCEDURE:  DATE OF DISCHARGE:                               OPERATIVE REPORT   REFERRING PHYSICIAN:  Tesfaye D. Felecia Shelling, MD   PROCEDURE:  Esophagogastroduodenoscopy with cold forceps and biopsy.   INDICATIONS FOR EXAM:  [name] is a 45 year old female who was admitted  to have difficulty swallowing since 2007.  She had a barium swallow  which showed gastroesophageal reflux disease but normal esophageal  motility in 2007.  Her upper endoscopy is being performed to evaluate  for dysphagia.   FINDINGS:  1. Normal esophagus without evidence of Barrett's, mass, erosion or      stricture.  2. Multiple gastric polyps seen in the stomach.  Biopsies obtained via      cold forceps.  Otherwise, no erosion or ulceration.  3. Normal duodenal bulb and second portion of the duodenum.   DIAGNOSIS:  Most likely etiology for [name]'s difficulty swallowing is  gastroesophageal reflux disease.   RECOMMENDATIONS:  1. Increase Aciphex to twice daily.  2. She should follow the recommendations for lifestyle modifications      for reflux disease.  3. She has a follow up appointment to see me in eight weeks.  If her      swallowing is not improved, then may consider esophageal manometry.      No aspirin, NSAIDs or anticoagulation for five days.  4. Will call [name] with the result of her biopsies.  5. She should wait three hours before being supine after eating.  At      her next visit, we will discuss the benefits versus the risks for      colonoscopy.  She has significant hypoxia with conscious sedation      and required reversal of Demerol and Versed.  She does need a      colonoscopy because her mother had colon cancer in her 52s.  She  will be referred to Anesthesia for an evaluation to assess the      benefits versus the risks of having the colonoscopy  done under      general anesthesia at [facility].   MEDICATIONS:  1. Demerol 50 mg IV.  2. Versed 4 mg IV.  3. Narcan 0.4 mg IV.  4. Romazicon 0.4 mg IV.   PROCEDURE TECHNIQUE:  Physical exam was performed.  Informed consent was  obtained from the patient's guardian after explaining a bit of his risks  and alternatives to the procedure.  The patient is unable to sign for  herself but understands the benefits and the risks of the procedure.  The patient was connected to the monitor and placed in the left lateral  position.  IV sedation was administered via indwelling cannula.  The  patient had a short period of hypoxia with her O2  sats into the 80s.  The hypoxia resolved with repositioning of her head and relieving any  obstruction the tongue might have been causing.  The patient's esophagus  was intubated with diagnostic gastroscope and advanced under direct  visualization to the second portion of the duodenum.  During the  procedure, the patient had coarse breath sounds, and her O2 sats dropped  to the 80s.  The scope was withdrawn slowly by carefully examining  the  color, texture and anatomy and integrity of the mucosa on the way out  while Narcan and Romazicon were being administered.  Following the  procedure, the patient's O2 sats temporarily dropped into the 70s and a  nonrebreather mask was applied.  Upon completion of the procedure, the  patient's O2 sat was 100%, and she was alert, and the coarse breath  sounds were gone.  The patient was recovered in endoscopy and discharged  home in satisfactory condition.      Kassie Mends, M.D.  Electronically Signed     SM/MEDQ  D:  06/25/2007  T:  06/26/2007  Job:  [id number]   cc:   Tesfaye D. Felecia Shelling, MD  Fax: [phone]

## 2010-09-25 NOTE — Op Note (Signed)
[name], [name]                  ACCOUNT NO.:  [id number]   MEDICAL RECORD NO.:  [id number]          PATIENT TYPE:  AMB   LOCATION:  DAY                           FACILITY:  [facility]   PHYSICIAN:  Kassie Mends, M.D.      DATE OF BIRTH:  05-20-1965   DATE OF PROCEDURE:  08/19/2007  DATE OF DISCHARGE:                               OPERATIVE REPORT   PROCEDURE:  Colonoscopy.   REFERRING PHYSICIAN:  Tesfaye D. Felecia Shelling, MD   INDICATION FOR EXAM:  [name] is a 45 year old female whose mother was  diagnosed with colon cancer at age less than 70.  She presents for colon  cancer screening.   FINDINGS:  1. A 3-mm cecal polyp removed via cold forceps.  Otherwise, no masses,      inflammatory changes, diverticulita or AVM seen.  2. Normal retroflex view of the rectum.   RECOMMENDATIONS:  1. Screening colonoscopy in 5 years.  We will call [name] with the      results of her biopsy.  2. She should follow a high-fiber diet.  She was given a handout on      high-fiber diet and polyps.   MEDICATIONS:  Propofol provided by Anesthesia.   PROCEDURE TECHNIQUE:  Physical exam was performed.  An informed consent  was obtained from the patient after explaining the benefits, risks, and  alternatives to the procedure.  The patient was connected to monitor and  placed in left lateral position.  Continuous oxygen was provided by  nasal cannula.  IV Medicine administered through an indwelling cannula.  After administration of sedation and rectal exam, the patient's rectum  was intubated, and the scope was advanced under direct visualization to  the cecum.  The scope was removed slowly by carefully examining the  color, texture, anatomy, and integrity of the mucosa on the way out.  The patient was recovered in endoscopy and discharged home in  satisfactory condition.      Kassie Mends, M.D.  Electronically Signed    SM/MEDQ  D:  08/19/2007  T:  08/20/2007  Job:  [id number]   cc:   Tesfaye D. Felecia Shelling,  MD  Fax: [phone]

## 2010-09-25 NOTE — Assessment & Plan Note (Signed)
[name], [name]                   CHART#:  [id number]   DATE:  12/23/2007                       DOB:  1965-07-15   REFERRING PHYSICIAN:  Tesfaye D. Felecia Shelling, MD   PROBLEM LIST:  1. Simple adenoma on colonoscopy in April 2009 in a female whose      mother was diagnosed with colon cancer at age less than 46.  2. Dysphagia likely secondary to GERD.  3. Cerebral palsy.  4. Constipation.  5. Hypertension.  6. Hiatal hernia.  7. History of esophageal dilation in 2004 and 2006.   SUBJECTIVE:  The patient is a 45 year old female who presents as a  return patient visit.  She was last seen in March 2009.  She was asked  to continue her Aciphex and scheduled for a colonoscopy.  The  colonoscopy was performed with propofol and she had a 3-mm cecal polyp.  She has continued to use MiraLax once a day and her bowels are moving  very well.  She occasionally has heartburn.  She had heartburn yesterday  after eating chicken soup with onions and cheese.  She had [place] food  today with pepper steak and onions and had heartburn again.  She does  not have any problems swallowing.   MEDICATIONS:  1. Aciphex 20 mg b.i.d.  2. Lisinopril 2.5 mg daily.  3. MiraLax 17 g daily.   OBJECTIVE:   PHYSICAL EXAMINATION:  VITAL SIGNS:  Height 5 feet 2 inches, temperature  98, blood pressure 128/88, and pulse 72.  GENERAL:  She is in no apparent distress.  Alert and oriented x4.LUNGS:  Clear to auscultation bilaterally.CARDIOVASCULAR:  Regular rhythm.  No  murmur.  ABDOMEN:  Bowel sounds are present.  Soft, nontender, and nondistended  (exam limited by the patient being in a wheelchair).   ASSESSMENT:  The patient is a 45 year old female whose gastroesophageal  reflux disease is fairly well controlled.  She has some dietary  indiscretions and which is likely contributing to breakthrough symptoms.  She also sometimes eats with a left and a right posture, which may be  contributing to her uncontrolled  heartburn.  Her constipation has  resolved. Thank you for allowing me to see the patient in consultation.  My recommendations is follows.   RECOMMENDATIONS:  1. She should continue her Aciphex twice daily.  2. Outpatient visit for a month.  3. She may use Maalox, Mylanta, or Tums for breakthrough symptoms, as      she is strictly adhering to her dietary restrictions.  She is to      call if she is still using Maalox, Mylanta, and Tums more than 2-3      times a week.  4. She was given the gastroesophageal reflux disease handout and asked      to follow with recommendations.  I did highlight those points that      need to be emphasized such as eating small amounts frequently,      avoiding foods that add triggers, and avoiding lying down up to 3-4      hours after eating.       Kassie Mends, M.D.  Electronically Signed     SM/MEDQ  D:  12/23/2007  T:  12/24/2007  Job:  [id number]   cc:   Tesfaye D. Felecia Shelling, MD

## 2010-09-28 NOTE — Procedures (Signed)
[facility]  Patient:    [name], [name]                        MRN: [id number] Adm. Date:  [id number] Attending:  Mervin Hack                           Procedure Report  PROCEDURE:  Upper endoscopy and esophageal dilation.  INDICATIONS:  This 45 year old African-American female has developed progressive solid food dysphagia.  She had an upper endoscopy with esophageal dilation in 1996 with complete response after being dilated.  She has recently had mostly pill dysphagia and regurgitation.  She is now undergoing upper endoscopy and esophageal dilation.  ENDOSCOPE:  Olympus single-channel video endoscope.  SEDATION:  Versed 5 mg IV.  FINDINGS:  Esophagus:  Olympus single-channel video scope passed under direction through the oropharynx into the esophagus.  The patient was monitored by pulse oximetry, and oxygen saturations were between 77-97% on 2-5 L of nasal oxygen.  She had a lot of secretions pooled in the pharynx. Her tongue was partially occluding the airway at times.  Valleculae and piriform sinuses were normal.  Patient had spasm in the upper esophageal area. It was unclear whether there was a web or spasm over the proximal esophagus. Once the endoscope passed through, it was easy to move it back and forth through the esophagus.  The lumen appeared normal in the mid- and distal esophagus with essentially a normal squamocolumnar junction.  There was a sliding hiatal hernia measuring 3 cm.  Stomach:  Stomach was insufflated with air and showed normal rugal folds, gastric antrum, and pyloric outlet.  Retroflexion of the scope revealed normal fundus and cardia.  Duodenum:  Duodenum above the descending duodenum was normal.  A guidewire was then placed into the stomach, the endoscope was retracted, and Savary dilators passed over the guidewire using 14 mm dilator.  There was mild resistance and blood tinge from the dilator.  The  resistance was encountered mostly in the proximal esophagus.  Next, 15 mm and 16 mm dilators passed through, but there was no blood on the dilator.  The patient tolerated the procedure well.  IMPRESSION: 1. Status post passage of Savary dilators for suspected proximal esophageal    stricture versus spasm. 2. Hiatal hernia.  PLAN:  Return to liquid diet for next two hours, then soft food.  Patient will report to [place] in the next several days to improvement of her symptoms.  She is to follow antireflux measures. DD:  11/27/00 TD:  11/27/00 Job: [id number]

## 2010-09-28 NOTE — Op Note (Signed)
NAMETEILA, [name]                            ACCOUNT NO.:  [id number]   MEDICAL RECORD NO.:  [id number]                   PATIENT TYPE:  AMB   LOCATION:  DAY                                  FACILITY:  [facility]   PHYSICIAN:  Lionel December, M.D.                 DATE OF BIRTH:  05/26/1965   DATE OF PROCEDURE:  12/21/2002  DATE OF DISCHARGE:                                  PROCEDURE NOTE   PROCEDURE:  Esophagogastroduodenoscopy with esophageal dilatation.   INDICATIONS FOR PROCEDURE:  Aurorah is a 45 year old African American female  with cerebral palsy who is limited to bed and wheelchair who presents with a  few week history of dysphagia to solids and also to liquids.  She has  chronic GERD and is maintained on PPI and presently on Carafate.  She has  had at least dilatations in the past.  The last one was performed by me in  May, 2003 which provided relief for several months.   Informed consent for the procedure was obtained from Ms. ____ Jennette Kettle, the  patient's sister, who has power of attorney.  However, the patient seemed to  have a good understanding of what we were trying to explain to her.   PREOPERATIVE MEDICATIONS:  Cetacaine spray for pharyngeal topical  anesthesia, Demerol 25 mg IV, Versed 3 mg IV in divided doses.   FINDINGS:  The procedure was performed in the endoscopy suite.  The  patient's vital signs and O2 saturations were monitored during the procedure  and remained stable.  The patient was placed in the left lateral recumbent  position, and the Olympus videoscope was passed via the oropharynx without  any difficulty into the esophagus.  The laryngopharynx was normal.   Esophagus:  The mucosa of the esophagus was normal, except the  squamocolumnar junction was wavy with focal erythema at the GE junction, but  there was no ring or stricture.  There was a 3-cm long sliding hiatal  hernia.  The last exam revealed more or less similar findings.   Stomach:  It was empty  and distended very well with insufflation.  The folds  of the proximal stomach were normal.  Examination of the mucosa at the body,  antrum, pyloric channel, as well as angularis, fundus, and cardia was  normal.   Duodenum:  Examination of the bulb revealed normal mucosa.  The scope was  passed to the second part of the duodenum where the mucosa and folds were  normal.  The endoscope was withdrawn.   The esophagus was dilated by passing a 56 [place] Maloney dilator through the  esophagus to full insertion.  The esophagus was examined again, and there  was no mucosal injury.  The patient tolerated the procedure well.   FINAL DIAGNOSES:  1. Small sliding hiatal hernia with serrated gastroesophageal junction and a     focal erythema; however,  no ring, stricture, or ulceration was noted.  2. Esophagus dilated by passing a 56 [place] Maloney dilator.  3. Normal examination of the stomach and first and second part of duodenum.   RECOMMENDATIONS:  1. She will continue antireflux measures and Prevacid as before.  `  2. She will discontinue her Carafate.  3. If she remains with dysphagia, we will need to assess her for esophageal     dysmotility starting with a barium study with the barium pill.                                               Lionel December, M.D.    NR/MEDQ  D:  12/21/2002  T:  12/21/2002  Job:  [id number]   cc:   Tesfaye D. Felecia Shelling, M.D.  [address]  [place]  [place] [address]  Fax: [phone]

## 2010-09-28 NOTE — Op Note (Signed)
NAMESTEPHENIE, [name]                  ACCOUNT NO.:  [id number]   MEDICAL RECORD NO.:  [id number]          PATIENT TYPE:  AMB   LOCATION:  DAY                           FACILITY:  [facility]   PHYSICIAN:  Lionel December, M.D.    DATE OF BIRTH:  [birth year]   DATE OF PROCEDURE:  08/03/2004  DATE OF DISCHARGE:                                 OPERATIVE REPORT   PROCEDURE:  Esophagogastroduodenoscopy with esophageal dilation.   INDICATION:  [name] is a 45 year old Afro-American female with mental  retardation who has chronic GERD. She now presents with dysphagia. She has  difficulty primarily with solids. She has undergone ED in May 2003 and again  in August 2004 with satisfactory results. With her cerebral palsy, I  concerned that she may have underlying esophageal motility disorder;  however, since she has responded well to esophageal dilation in the past, we  decided to proceed with it. Informed consent for the procedure was obtained  from the patient's mother, [name] who is power of attorney.   PREMEDICATION:  Cetacaine spray for pharyngeal topical anesthesia, Demerol  25 mg IV, Versed 3 mg IV.   FINDINGS:  Procedure performed in endoscopy suite. The patient's vital signs  and O2 saturation were monitored during procedure and remained stable. The  patient was placed in left lateral position and Olympus videoscope was  passed via oropharynx without any difficulty into esophagus.   Esophagus. Mucosa of the esophagus was normal. The GE junction was at 34 cm  from the incisors and was serrated. No ring, stricture or erosions were  noted. Hiatus was at 37.   Stomach. It was empty and distended very well insufflation. Folds of  proximal stomach were normal. Examination of mucosa at body, antrum, pyloric  channel as well as angularis, fundus and cardia was normal.   Duodenum. Bulbar mucosa was normal. Scope was passed to the second part of  duodenum where mucosa and folds were normal.  Endoscope was withdrawn.   Esophagus was dilated by passing 56-French Maloney dilator to full  insertion. As the dilator was withdrawn, endoscope was passed again, and  there was no mucosal disruption noted of the esophagus. Endoscope was  withdrawn. The patient tolerated the procedure well.   FINAL DIAGNOSIS:  1.  Small sliding hiatal hernia with serrated GE junction but no evidence of      erosive esophagitis, ring or stricture formation.  2.  Normal exam of the stomach and first and second part of the duodenum.   Esophagus dilated by passing 56-French Maloney dilator given history of  solid food dysphagia, although it did not induce any mucosal disruption of  the esophagus.   RECOMMENDATIONS:  She will continue anti-reflux measures and Aciphex 20 mg  p.o. q.a.m.Marland Kitchen She will return for OV in 3 months from now.      NR/MEDQ  D:  08/03/2004  T:  08/03/2004  Job:  [id number]   cc:   Tesfaye D. Felecia Shelling, MD  [address]  [place]  [place] [address]  Fax: [phone]

## 2010-09-28 NOTE — Op Note (Signed)
[facility]  Patient:    [name], [name] Visit Number: [id number] MRN: [id number]          Service Type: END Location: DAY Attending Physician:  Malissa Hippo Dictated by:   Lionel December, M.D. Proc. Date: 10/08/01 Admit Date:  10/08/2001   CC:         Avon Gully, M.D.   Operative Report  PROCEDURE:  Esophagogastroduodenoscopy with esophageal dilatation.  GASTROENTEROLOGIST:  Lionel December, M.D.  INDICATION:  [name] is a 45 year old African-American female with history of palsy who presents with dysphagia of primarily solids.  Recent barium study reveals hangover at the GE junction.  She has chronic GERd.  She had her esophagus dilated about five years ago and more recently last July in [place].  Informed consent for the procedure was obtained from her sister, [name].  [name] seemed to also understand the procedure very well.  PREOPERATIVE MEDICATIONS:  Cetacaine spray for pharyngeal topical anesthesia, Demerol 25 mg IV, Versed 2 mg IV.  INSTRUMENT:  Olympus video system.  FINDINGS:  Procedure performed in endoscopy suite. The patient was placed in the left lateral position and endoscope was passed into the oropharynx without difficulty into esophagus.  Esophagus:  Mucosa of the esophagus was normal.  GE junction was serrated or wavy, but there was no stricture or ring formation.  There was also no evidence of erosions.  There was small sliding hiatal hernia.  Stomach:  It was empty and distended very well with insufflation.  Folds in the proximal stomach were normal.  Examination of mucosa at gastric body, antrum, and pyloric channel as well as angularis and fundus were normal.  Duodenum:  Duodenum as well as bulb and second part of duodenum were also normal.  The endoscope was withdrawn.  Esophageal dilatation was performed by passing 54-French Maloney dilator through the esophagus completely.  As the dilatation was  completed, endoscope was passed again in the esophagus, but there was no mucosal injury.  Endoscope was withdrawn.  The patient tolerated the procedure well.  FINAL DIAGNOSES: 1. Small sliding hiatal hernia with serrated or wavy gastroesophageal junction    but no evidence of ring or stricture formation. 2. Normal exam of stomach, first and second part of duodenum. 3. Esophagus dilated by passing 54-French Maloney dilator through the    esophagus.  RECOMMENDATIONS: 1. She should continue antireflux measures and Aciphex as before.  If the    dysphagia persists, may consider dilating esophagus to 60-French; hopefully    she will not need it in the near future. 2. She was also advised to take fiber or clear Citrucel 1 tablespoonful daily    as she is having intermittent nonbloody diarrhea according to her family    members. Dictated by:   Lionel December, M.D. Attending Physician:  Malissa Hippo DD:  10/08/01 TD:  10/09/01 Job: [id number]

## 2010-09-28 NOTE — H&P (Signed)
NAMEBENJAMIN, MERRIHEW                  ACCOUNT NO.:  [id number]   MEDICAL RECORD NO.:  [id number]           PATIENT TYPE:   LOCATION:                                 FACILITY:   PHYSICIAN:  Lionel December, M.D.    DATE OF BIRTH:  1965-12-17   DATE OF ADMISSION:  08/03/2004  DATE OF DISCHARGE:  LH                                HISTORY & PHYSICAL   CHIEF COMPLAINT:  Chest hurts.  It feels like food is getting stuck.   HISTORY OF PRESENT ILLNESS:  [name] is a 45 year old black female with  cerebral palsy who presents today for further evaluation of the above stated  symptoms.  She was last seen, in July 2005, with epigastric pain and GERD.  At that time she had an ultrasound which was negative.  Her Prevacid was  increased to b.i.d. and she had some improvement of her symptoms with this.  Prior to that she had two EGDs for dysphagia.  The first one in May 2003, by  Dr. Karilyn Cota, which a small sliding hiatal hernia with serrated or wavy GE  junction.  The esophagus was dilated empirically with 54-French [facility]  dilator.  She had another EGD with esophageal dilatation, December 21, 2002,  by Dr. Karilyn Cota, which revealed similar findings but this time her esophagus  was dilated with a 56-French [facility] dilator.  The patient states that  stretching her esophagus does help her swallowing.  Over the last several  months she has had recurrent dysphagia.  She has difficulty swallowing solid  food only.  Sometimes she has to wash the food down with liquids.  She has  some nausea but no vomiting.  Her Prevacid was switched to Aciphex with no  improvement of her symptoms.  Denies any abdominal pain, melena, rectal  bleeding, constipation, diarrhea.  There is questionable weight loss.  Her  caretaker states that her clothes seem a little bit loose but not  significantly.   CURRENT MEDICATIONS:  1.  Aciphex 20 mg every day.  2.  Colon cleanse daily.  3.  Prenatal vitamins with iron every day.  4.  Biotin  1,000 mcg every day.  5.  Multivitamin every day.  6.  Vitamin B-6 200 mg every day.  7.  Vitamin B-12 daily.   ALLERGIES:  No known drug allergies.   PAST MEDICAL HISTORY:  1.  Cerebral palsy.  2.  Chronic GERD and EGD as outlined above.   FAMILY HISTORY:  Mother was diagnosed with colon cancer in her late 93s and  is alive and doing well.  She also has a history of diabetes mellitus.  Father deceased secondary to an accident.  She has one sister with renal  problems and one brother with diabetes mellitus.   SOCIAL HISTORY:  She lives with her mother.  She is disabled.  She does have  a caretaker who comes daily.  She denies any tobacco, alcohol, or drug use.   REVIEW OF SYSTEMS:  See HPI for GI and constitutional.  She states her  appetite is good.  CARDIOVASCULAR:  Denies any palpitations, chest pain,  shortness of breath.   PHYSICAL EXAMINATION:  VITAL SIGNS:  Weight unable to obtain.  Blood  pressure 132/80, pulse 84.  GENERAL:  Pleasant, well nourished, well developed, black female in no acute  distress.  She has involuntary tremors.  She is wheelchair bound.  SKIN:  Warm and dry.  No jaundice.  HEENT:  Conjunctivae are pink.  Sclerae are nonicteric.  Oropharyngeal moist  and pink.  No lesions, erythema, or exudate.  No lymphadenopathy,  thyromegaly.  CHEST:  Lungs are clear to auscultation, however, decreased breath sounds  bilaterally.  CARDIAC:  Reveals a regular rate and rhythm.  No murmurs, rubs or gallops.  ABDOMEN:  Positive bowel sounds.  Soft, nontender, nondistended.  No  organomegaly or masses.  No rebound tenderness or guarding.  No abdominal  hernias or bruits.  EXTREMITIES:  No edema.   IMPRESSION:  [name] is a 45 year old lady with cerebral palsy who presents  with recurrent esophageal dysphagia as well as nausea.  She has a history of  chronic gastroesophageal reflux disease.  She reports prior esophageal  dilatations helped with her dysphagia.   We  discussed approach of possible barium pill esophagram versus repeat  esophageal dilatation at this time.  It has been a year and a half since her  last exam.  She actually would like to proceed with the EGD with esophageal  dilatation initially.  I talked with her today that if she has recurrent  dysphagia after this EGD, she will need to have further workup for  esophageal dysmotility.   PLAN:  1.  EGD with esophageal dilatation near future.  2.  She will continue Aciphex 20 mg daily for now.      LL/MEDQ  D:  08/01/2004  T:  08/01/2004  Job:  [id number]   cc:   Tesfaye D. Felecia Shelling, MD  [address]  [place]  [place] [address]  Fax: [phone]

## 2010-10-04 ENCOUNTER — Encounter: Payer: Self-pay | Admitting: Gastroenterology

## 2010-10-04 ENCOUNTER — Other Ambulatory Visit: Payer: Self-pay | Admitting: Anesthesiology

## 2010-10-04 ENCOUNTER — Ambulatory Visit (INDEPENDENT_AMBULATORY_CARE_PROVIDER_SITE_OTHER): Payer: PRIVATE HEALTH INSURANCE | Admitting: Gastroenterology

## 2010-10-04 ENCOUNTER — Encounter (HOSPITAL_COMMUNITY): Payer: PRIVATE HEALTH INSURANCE

## 2010-10-04 DIAGNOSIS — K219 Gastro-esophageal reflux disease without esophagitis: Secondary | ICD-10-CM

## 2010-10-04 DIAGNOSIS — R1310 Dysphagia, unspecified: Secondary | ICD-10-CM

## 2010-10-04 LAB — BASIC METABOLIC PANEL
BUN: 11 mg/dL (ref 6–23)
CO2: 30 mEq/L (ref 19–32)
Calcium: 10.1 mg/dL (ref 8.4–10.5)
Chloride: 101 mEq/L (ref 96–112)
Creatinine, Ser: 0.52 mg/dL (ref 0.4–1.2)
GFR calc Af Amer: >60 mL/min (ref >60)
GFR calc non Af Amer: >60 mL/min (ref >60)
Glucose, Bld: 99 mg/dL (ref 70–99)
Potassium: 3.9 mEq/L (ref 3.5–5.1)
Sodium: 139 mEq/L (ref 135–145)

## 2010-10-04 LAB — HEMOGLOBIN AND HEMATOCRIT, BLOOD: HCT: 37.7 % (ref 36.0–46.0)

## 2010-10-04 MED ORDER — DEXLANSOPRAZOLE 60 MG PO CPDR: DELAYED_RELEASE_CAPSULE | ORAL | Status: DC

## 2010-10-04 NOTE — Progress Notes (Signed)
Pt is aware of OV in 3 months with SF/E30 visit

## 2010-10-04 NOTE — Progress Notes (Signed)
  Subjective:    Patient ID: [name], female    DOB: 16-Apr-1966, 45 y.o.   MRN: [id number]  PCP: [facility]  HPI Within last week, squeezing pain in chest. Sx last hours. Not related to eating. Eating doesn't change sx. No vomiting. Spit up last week. Nausea: 2-3 times a week. No weight loss. Bms: ok, once a day. Not using Mylanta or Maalox prn. Had U/S May 2012-NAIAP. Hurts sometimes when she swallows. Last week a pill got stuck. Feels like solid foods have a problem going down once a week, not liquids.   Past Medical History  Diagnosis Date  . GERD (gastroesophageal reflux disease) 2007    BPE NL ESO, REFLUX  . Dysphagia 2003    EGD/DIL 2003, 2004, 2006 w/ 54-56 Fr Maloney; BPE 2007-NL, ESO, GERD; EGD 2009-NL ESO  . Constipation - functional   . Hiatal hernia   . HTN (hypertension)   . Cerebral palsy    Past Surgical History  Procedure Date  . Colonoscopy APR 2009 with proprofol    SIMPLE ADENOMAS  . Upper gastrointestinal endoscopy FEB 2009-HYPOXIA REQUIRING NARCAN, & VERSED, D50V4    NL ESO, FUNDIC GLAND POLYPS  . Upper gastrointestinal endoscopy w/ DIL 2003, 2004, 2006   No Known Allergies  Current Outpatient Prescriptions  Medication Sig Dispense Refill  . dicyclomine (BENTYL) 10 MG capsule Take 10 mg by mouth 4 (four) times daily -  before meals and at bedtime.        Marland Kitchen lisinopril (PRINIVIL,ZESTRIL) 2.5 MG tablet Take 2.5 mg by mouth daily.        . ranitidine (ZANTAC) 300 MG tablet Take 300 mg by mouth at bedtime.        Marland Kitchen omeprazole (PRILOSEC) 20 MG capsule Take 20 mg by mouth daily.        .       Family History  Problem Relation Age of Onset  . Colon cancer Mother     LATE 50S   Review of Systems  All other systems reviewed and are negative.       Objective:   Physical Exam  Vitals reviewed. Constitutional: She appears well-developed and well-nourished. No distress.  HENT:  Head: Normocephalic and atraumatic.  Cardiovascular:  Normal rate, regular rhythm and normal heart sounds.   Pulmonary/Chest: Effort normal and breath sounds normal.  Abdominal: Soft. Bowel sounds are normal. She exhibits no distension. There is no tenderness.  Neurological: She is alert.       NO NEW FOCAL DEFICITS          Assessment & Plan:

## 2010-10-04 NOTE — Progress Notes (Signed)
Cc to PCP 

## 2010-10-04 NOTE — Assessment & Plan Note (Signed)
Likely 2o to pill esophagitis. Differential diagnosis includes less likely HSV esophagitis, erosive esophagitis from uncontrolled GERD.   EGD 5/25. OPV in 3 mos.

## 2010-10-04 NOTE — Assessment & Plan Note (Addendum)
Likley 2o to uncontrolled GERD. Differential diagnosis includes peptic stricture.  EGD/?dil w/ propofol Oct 05, 2010. Pt had episode of hypoxia with conscious sedation in 2009 requiring Narcan and Romazicon. Change to Dexilant 60 mg every AM. Consider repeat BPE if Sx not improved.

## 2010-10-04 NOTE — Assessment & Plan Note (Signed)
Sx not ideally controlled.  D/C OMP. Start Dexilant.

## 2010-10-05 ENCOUNTER — Ambulatory Visit (HOSPITAL_COMMUNITY)
Admission: RE | Admit: 2010-10-05 | Discharge: 2010-10-05 | Disposition: A | Discharge status: Home / Self Care | Payer: PRIVATE HEALTH INSURANCE | Source: Ambulatory Visit | Attending: Gastroenterology | Admitting: Gastroenterology

## 2010-10-05 ENCOUNTER — Encounter: Payer: PRIVATE HEALTH INSURANCE | Admitting: Gastroenterology

## 2010-10-05 DIAGNOSIS — R079 Chest pain, unspecified: Secondary | ICD-10-CM

## 2010-10-05 DIAGNOSIS — I10 Essential (primary) hypertension: Secondary | ICD-10-CM | POA: Insufficient documentation

## 2010-10-05 DIAGNOSIS — K219 Gastro-esophageal reflux disease without esophagitis: Secondary | ICD-10-CM | POA: Insufficient documentation

## 2010-10-05 DIAGNOSIS — R1310 Dysphagia, unspecified: Secondary | ICD-10-CM

## 2010-10-05 DIAGNOSIS — G809 Cerebral palsy, unspecified: Secondary | ICD-10-CM | POA: Insufficient documentation

## 2010-10-05 DIAGNOSIS — R0789 Other chest pain: Secondary | ICD-10-CM | POA: Insufficient documentation

## 2010-10-05 DIAGNOSIS — D131 Benign neoplasm of stomach: Secondary | ICD-10-CM | POA: Insufficient documentation

## 2010-10-05 DIAGNOSIS — Z79899 Other long term (current) drug therapy: Secondary | ICD-10-CM | POA: Insufficient documentation

## 2010-11-07 NOTE — Op Note (Signed)
  [name], [name]                  ACCOUNT NO.:  [id number]  MEDICAL RECORD NO.:  [id number]           PATIENT TYPE:  LOCATION:                                 FACILITY:  PHYSICIAN:  [name], M.D.     DATE OF BIRTH:  06-Jan-1966  DATE OF PROCEDURE:  10/05/2010 DATE OF DISCHARGE:                              OPERATIVE REPORT   PROCEDURE:  Esophagogastroduodenoscopy with Savary dilation to 16 mm.  INDICATION FOR EXAM:  [name] is a 45 year old female who presents with dysphagia and chest pain.  She feels like pills and solid food gets stuck.  She has a known history of uncontrolled gastroesophageal reflux disease and cerebral palsy.  FINDINGS: 1. Unable to appreciate definite stricture in the esophagus.  Her     esophagus was dilated empirically with a 16-mm Savary dilator.  The     dilator passed with mild resistance.  No evidence of Barrett's,     mass, erosion, or ulceration. 2. 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  No erosions or ulcerations. 3. Normal duodenal bulb, ampulla, and second portion of the duodenum.  DIAGNOSIS:  Dysphagia secondary to uncontrolled gastroesophageal reflux disease and less likely a distal esophageal stricture or ring.  She may also have an esophageal motility disorder or difficulty with a large food boluses due to her underlying cerebral palsy.  RECOMMENDATIONS: 1. She should continue the Dexilant daily. 2. She already has a follow up appointment to see me in 3 months.  If     her swallowing difficulties persist, then she will need a repeat     barium pill esophagram.  MEDICATIONS:  Propofol provided by Anesthesia.  PROCEDURE TECHNIQUE:  Physical exam was performed.  Informed consent was obtained from the patient after explaining the benefits, risks, and alternatives to the procedure.  The patient was connected to the monitor and placed in left lateral position.  Continuous  oxygen was provided by nasal cannula and IV medicine administered through an indwelling cannula.  After administration of sedation, the patient's esophagus was intubated and scope advanced under direct visualization to the second portion of the duodenum.  The scope was removed slowly by careful examining the color, texture, anatomy, and integrity of mucosa on the way out.  The patient was recovered in endoscopy and discharged home in satisfactory condition.     [name], M.D.     SF/MEDQ  D:  10/05/2010  T:  10/06/2010  Job:  [id number]  cc:   [name] [facility]  Electronically Signed by [name] M.D. on 11/07/2010 01:44:34 PM

## 2011-01-09 ENCOUNTER — Encounter: Payer: Self-pay | Admitting: Gastroenterology

## 2011-01-09 ENCOUNTER — Ambulatory Visit (INDEPENDENT_AMBULATORY_CARE_PROVIDER_SITE_OTHER): Payer: PRIVATE HEALTH INSURANCE | Admitting: Gastroenterology

## 2011-01-09 VITALS — BP 128/80 | HR 73 | Temp 98.2°F | Wt 125.0 lb

## 2011-01-09 DIAGNOSIS — R1013 Epigastric pain: Secondary | ICD-10-CM

## 2011-01-09 MED ORDER — METOCLOPRAMIDE HCL 5 MG PO TABS: ORAL_TABLET | ORAL | Status: DC

## 2011-01-09 NOTE — Patient Instructions (Addendum)
You are most likely having breakthrough reflux.  Stop taking Zantac every night. Use it every MON, WED, & FRI. Use Reglan 30 minutes prior to supper and at bedtime.  REGLAN MAY CAUSE AGITATION, SEDATION, BLURRY VISION, BREAST DISCHARGE, AND TARDIVE DYSKINESIA. Continue Dexilant. Follow up in 2 mos.

## 2011-01-09 NOTE — Progress Notes (Signed)
   Subjective:    Patient ID: [name], female    DOB: 1965/11/04, 45 y.o.   MRN: [id number]  PCP: [facility]  HPI Dysphagia resolved. Continues with pain in epigastrium in the AM. Dexilant helps but Sx not resolved.  No vomiting. Taking Zantac qhs for > 1 mo.No other concerns.  Past Medical History  Diagnosis Date  . GERD (gastroesophageal reflux disease) 2007    BPE NL ESO, REFLUX  . Dysphagia 2003    2O to GERD/HH, ?difficulty with large food bolus due to neuromuscular weakness  . Constipation - functional   . Hiatal hernia   . HTN (hypertension)   . Cerebral palsy   . Epigastric pain MAY 2012 ABD [place] NL GBLIVPAN    2o to GERD V. NON-ULCER DYSPEPSIA    Past Surgical History  Procedure Date  . Colonoscopy APR 2009 with proprofol    SIMPLE ADENOMAS  . Upper gastrointestinal endoscopy MAY 2012 DIL 16 mm    NO DEFINITE STRICTURE APPRECIATED  . Upper gastrointestinal endoscopy FEB 2009-HYPOXIA REQUIRING NARCAN, & VERSED, D50V4    NL ESO, FUNDIC GLAND POLYPS  . Upper gastrointestinal endoscopy w/ DIL 2003, 2004, 2006   Family History  Problem Relation Age of Onset  . Colon cancer Mother     LATE 50S   Review of Systems  All other systems reviewed and are negative.  REVIEWE RADIOLOGY AND NOTES TO 2007     Objective:   Physical Exam  Vitals reviewed. Constitutional: She appears well-developed and well-nourished. No distress.  HENT:  Head: Normocephalic and atraumatic.  Nose: Nose normal.  Cardiovascular: Normal rate, regular rhythm and normal heart sounds.   Pulmonary/Chest: Effort normal and breath sounds normal.  Abdominal: Soft. Bowel sounds are normal. She exhibits no distension. There is no tenderness.  Neurological: She is alert.       NO NEW FOCAL DEFICITS          Assessment & Plan:

## 2011-01-09 NOTE — Assessment & Plan Note (Addendum)
Most likely 2o to breakthrough reflux after being supine  Stop taking Zantac every night. Use it every MON, WED, & FRI. Use Reglan 30 minutes prior to supper and at bedtime.  REGLAN MAY CAUSE AGITATION, SEDATION, BLURRY VISION, BREAST DISCHARGE, AND TARDIVE DYSKINESIA. Continue Dexilant. Follow up in 2 mos. CONSIDER ADDING TCS QHS OR REFERRAL FOR A NISSEN.

## 2011-01-09 NOTE — Progress Notes (Signed)
Pt is aware of OV for 10/10 @ 2pm with SF in E30 spot

## 2011-01-09 NOTE — Progress Notes (Signed)
Cc to PCP 

## 2011-02-05 LAB — BASIC METABOLIC PANEL
BUN: 8
CO2: 26
Calcium: 9.2
Chloride: 103
Creatinine, Ser: 0.55
GFR calc Af Amer: >60
GFR calc non Af Amer: >60
Glucose, Bld: 91
Potassium: 3.9
Sodium: 136

## 2011-02-05 LAB — HEMOGLOBIN AND HEMATOCRIT, BLOOD
HCT: 36.5
Hemoglobin: 12.6

## 2011-02-06 LAB — CBC
HCT: 34.9 — ABNORMAL LOW
Hemoglobin: 12.3
MCHC: 35.3
MCV: 90.7
Platelets: 319
RBC: 3.85 — ABNORMAL LOW
RDW: 13.2
WBC: 6.3

## 2011-02-06 LAB — BASIC METABOLIC PANEL
BUN: 11
CO2: 28
Calcium: 9.1
Chloride: 109
Creatinine, Ser: 0.65
GFR calc Af Amer: >60
GFR calc non Af Amer: >60
Glucose, Bld: 129 — ABNORMAL HIGH
Potassium: 3.6
Sodium: 141

## 2011-02-06 LAB — DIFFERENTIAL
Basophils Absolute: 0
Basophils Relative: 1
Eosinophils Absolute: 0.1
Eosinophils Relative: 1
Lymphocytes Relative: 34
Lymphs Abs: 2.2
Monocytes Absolute: 0.5
Monocytes Relative: 7
Neutro Abs: 3.6
Neutrophils Relative %: 57

## 2011-02-06 LAB — POCT CARDIAC MARKERS
CKMB, poc: <1 — ABNORMAL LOW
Myoglobin, poc: 33
Operator id: 157891
Troponin i, poc: <0.05

## 2011-02-12 ENCOUNTER — Other Ambulatory Visit: Payer: Self-pay

## 2011-02-12 MED ORDER — DICYCLOMINE HCL 10 MG PO CAPS: 10.0000 mg | ORAL_CAPSULE | Freq: Three times a day (TID) | ORAL | Status: DC

## 2011-02-20 ENCOUNTER — Ambulatory Visit (INDEPENDENT_AMBULATORY_CARE_PROVIDER_SITE_OTHER): Payer: PRIVATE HEALTH INSURANCE | Admitting: Gastroenterology

## 2011-02-20 ENCOUNTER — Encounter: Payer: Self-pay | Admitting: Gastroenterology

## 2011-02-20 VITALS — BP 120/70 | HR 70 | Temp 98.0°F | Ht 60.0 in | Wt 121.6 lb

## 2011-02-20 DIAGNOSIS — R1013 Epigastric pain: Secondary | ICD-10-CM

## 2011-02-20 NOTE — Progress Notes (Signed)
Cc to PCP 

## 2011-02-20 NOTE — Progress Notes (Signed)
Reminder in epic to follow up in 4 months °

## 2011-02-20 NOTE — Progress Notes (Signed)
  Subjective:    Patient ID: [name], female    DOB: 1965/08/16, 45 y.o.   MRN: [id number]  HPI Last visit having epigastric pain. Added Reglan. Sx controlled-Zantac qhs, Dexliant qd, & Reglab at super and bedtime. GOT HER WHEELCHAIR.  Past Medical History  Diagnosis Date  . GERD (gastroesophageal reflux disease) 2007    BPE NL ESO, REFLUX  . Dysphagia 2003    2O to GERD/HH, ?difficulty with large food bolus due to neuromuscular weakness  . Constipation - functional   . Hiatal hernia   . HTN (hypertension)   . Cerebral palsy   . Epigastric pain MAY 2012 ABD [place] NL GBLIVPAN    2o to GERD V. NON-ULCER DYSPEPSIA    Past Surgical History  Procedure Date  . Colonoscopy APR 2009 with proprofol    SIMPLE ADENOMAS  . Upper gastrointestinal endoscopy MAY 2012 DIL 16 mm    NO DEFINITE STRICTURE APPRECIATED  . Upper gastrointestinal endoscopy FEB 2009-HYPOXIA REQUIRING NARCAN, & VERSED, D50V4    NL ESO, FUNDIC GLAND POLYPS  . Upper gastrointestinal endoscopy w/ DIL 2003, 2004, 2006   No Known Allergies  Current Outpatient Prescriptions  Medication Sig Dispense Refill  . dexlansoprazole (DEXILANT) 60 MG capsule 1 PO EVERY MORNING    . dicyclomine (BENTYL) 10 MG capsule Take 1 capsule (10 mg total) by mouth 4 (four) times daily -  before meals and at bedtime.    Marland Kitchen lisinopril (PRINIVIL,ZESTRIL) 2.5 MG tablet Take 2.5 mg by mouth daily.      . metoCLOPramide (REGLAN) 5 MG tablet 1 PO 30 MINUTES PRIOR TO SUPPER AND AT BEDTIME    . ranitidine (ZANTAC) 300 MG tablet Take 300 mg by mouth at bedtime ? MWF          Review of Systems     Objective:   Physical Exam  Vitals reviewed. Constitutional: She appears well-developed and well-nourished.       EXAM LIMITED-PT IN [facility]  Cardiovascular: Normal rate, regular rhythm and normal heart sounds.   Pulmonary/Chest: Effort normal and breath sounds normal.  Abdominal: Soft. Bowel sounds are normal. She exhibits no distension. There is  no tenderness.          Assessment & Plan:

## 2011-02-20 NOTE — Assessment & Plan Note (Signed)
RESOLVED.  If begins to be a problem again, will increase Reglan and/or add TCA, Not likely to need a Nissen. OPVin 4 mos. Continue meds.

## 2011-03-04 ENCOUNTER — Other Ambulatory Visit: Payer: Self-pay | Admitting: Gastroenterology

## 2011-03-27 ENCOUNTER — Other Ambulatory Visit: Payer: Self-pay | Admitting: Gastroenterology

## 2011-04-09 ENCOUNTER — Other Ambulatory Visit: Payer: Self-pay

## 2011-04-09 MED ORDER — DICYCLOMINE HCL 10 MG PO CAPS: 10.0000 mg | ORAL_CAPSULE | Freq: Three times a day (TID) | ORAL | Status: DC

## 2011-05-27 ENCOUNTER — Other Ambulatory Visit: Payer: Self-pay | Admitting: Gastroenterology

## 2011-07-03 ENCOUNTER — Ambulatory Visit: Payer: PRIVATE HEALTH INSURANCE | Admitting: Gastroenterology

## 2011-07-10 ENCOUNTER — Ambulatory Visit (INDEPENDENT_AMBULATORY_CARE_PROVIDER_SITE_OTHER): Payer: PRIVATE HEALTH INSURANCE | Admitting: Gastroenterology

## 2011-07-10 ENCOUNTER — Encounter: Payer: Self-pay | Admitting: Gastroenterology

## 2011-07-10 DIAGNOSIS — K219 Gastro-esophageal reflux disease without esophagitis: Secondary | ICD-10-CM

## 2011-07-10 DIAGNOSIS — R1013 Epigastric pain: Secondary | ICD-10-CM

## 2011-07-10 DIAGNOSIS — R197 Diarrhea, unspecified: Secondary | ICD-10-CM

## 2011-07-10 DIAGNOSIS — R1310 Dysphagia, unspecified: Secondary | ICD-10-CM

## 2011-07-10 MED ORDER — METOCLOPRAMIDE HCL 5 MG PO TABS: ORAL_TABLET | ORAL | Status: DC

## 2011-07-10 MED ORDER — DICYCLOMINE HCL 10 MG PO CAPS: 10.0000 mg | ORAL_CAPSULE | Freq: Three times a day (TID) | ORAL | Status: DC

## 2011-07-10 NOTE — Assessment & Plan Note (Signed)
RESOLVED. 

## 2011-07-10 NOTE — Assessment & Plan Note (Signed)
DUE TO IBS, Sx RESOLVED.  CONTINUE DICYCLOMINE. OPV IN 6 MOS.

## 2011-07-10 NOTE — Progress Notes (Signed)
Reminder in epic to follow up in 6 months with SF in E30 °

## 2011-07-10 NOTE — Patient Instructions (Signed)
Continue REGLAN & DICYCLOMINE. USE MIRALAX AS NEEDED. FOLLOW UP IN 6 MOS.

## 2011-07-10 NOTE — Progress Notes (Signed)
  Subjective:    Patient ID: [name], female    DOB: March 20, 1966, 46 y.o.   MRN: [id number]  PCP: [facility]  HPI BmS: 1X/DAY. oNE EPISODE OF VOMITING SINCE LAST VISIT. NEEDS REFILLS ON DICYCLOMINE & REGLAN. nO CHANGES IN VISION, BREAST DISH=CHARGE, OR FEELING JITTERY.   Past Medical History  Diagnosis Date  . GERD (gastroesophageal reflux disease) 2007    BPE NL ESO, REFLUX  . Dysphagia 2003    2O to GERD/HH, ?difficulty with large food bolus due to neuromuscular weakness  . Constipation - functional   . Hiatal hernia   . HTN (hypertension)   . Cerebral palsy   . Epigastric pain MAY 2012 ABD [place] NL GBLIVPAN    2o to GERD V. NON-ULCER DYSPEPSIA    Past Surgical History  Procedure Date  . Colonoscopy APR 2009 with proprofol    SIMPLE ADENOMAS  . Upper gastrointestinal endoscopy MAY 2012 DIL 16 mm    NO DEFINITE STRICTURE APPRECIATED  . Upper gastrointestinal endoscopy FEB 2009-HYPOXIA REQUIRING NARCAN, & VERSED, D50V4    NL ESO, FUNDIC GLAND POLYPS  . Upper gastrointestinal endoscopy w/ DIL 2003, 2004, 2006    No Known Allergies  Current Outpatient Prescriptions  Medication Sig Dispense Refill  . DEXILANT 60 MG capsule TAKE 1 CAPSULE IN THE MORNING    . dicyclomine (BENTYL) 10 MG capsule Take 1 capsule (10 mg total) by mouth 4 (four) times daily -  before meals and at bedtime.    Marland Kitchen lisinopril (PRINIVIL,ZESTRIL) 2.5 MG tablet Take 2.5 mg by mouth daily.      . metoCLOPramide (REGLAN) 5 MG tablet 1 PO 30 MINUTES PRIOR TO SUPPER AND AT BEDTIME    . polyethylene glycol powder (GLYCOLAX/MIRALAX) powder USE 17 GRAMS BY MOUTH DAILY AS NEEDED    . ranitidine (ZANTAC) 300 MG tablet Take 300 mg by mouth at bedtime.            Review of Systems     Objective:   Physical Exam  Vitals reviewed. Constitutional: She is oriented to person, place, and time. She appears well-nourished. No distress.  HENT:  Head: Normocephalic and atraumatic.  Mouth/Throat: Oropharynx is clear and  moist. No oropharyngeal exudate.  Eyes: Pupils are equal, round, and reactive to light. No scleral icterus.  Neck: Normal range of motion. Neck supple.  Cardiovascular: Normal rate, regular rhythm and normal heart sounds.   Pulmonary/Chest: Effort normal and breath sounds normal.  Abdominal: Soft. Bowel sounds are normal. She exhibits no distension. There is no tenderness.       EXAM LIMITED-PT IN [facility]   Neurological: She is alert and oriented to person, place, and time.       NO  NEW FOCAL DEFICITS           Assessment & Plan:

## 2011-07-10 NOTE — Assessment & Plan Note (Addendum)
CONTROLLED.  CONTINUE DEXILANT & REGLAN. OPV IN 6 MOS

## 2011-07-11 NOTE — Progress Notes (Signed)
Faxed to PCP

## 2011-12-20 ENCOUNTER — Encounter: Payer: Self-pay | Admitting: Gastroenterology

## 2012-03-24 ENCOUNTER — Encounter: Payer: Self-pay | Admitting: Gastroenterology

## 2012-03-25 ENCOUNTER — Telehealth: Payer: Self-pay | Admitting: Gastroenterology

## 2012-03-25 ENCOUNTER — Encounter: Payer: Self-pay | Admitting: Gastroenterology

## 2012-03-25 ENCOUNTER — Other Ambulatory Visit: Payer: Self-pay | Admitting: Gastroenterology

## 2012-03-25 ENCOUNTER — Ambulatory Visit (INDEPENDENT_AMBULATORY_CARE_PROVIDER_SITE_OTHER): Payer: PRIVATE HEALTH INSURANCE | Admitting: Gastroenterology

## 2012-03-25 VITALS — BP 120/80 | HR 75 | Temp 98.5°F | Ht 65.0 in | Wt 127.0 lb

## 2012-03-25 DIAGNOSIS — R1013 Epigastric pain: Secondary | ICD-10-CM

## 2012-03-25 DIAGNOSIS — K219 Gastro-esophageal reflux disease without esophagitis: Secondary | ICD-10-CM

## 2012-03-25 DIAGNOSIS — R1310 Dysphagia, unspecified: Secondary | ICD-10-CM

## 2012-03-25 NOTE — Assessment & Plan Note (Signed)
LIKELY DUE TO NON-ULCER DYSPEPSIA, OR EPIGASTRIC PAIN.  CONTINUE DEXILANT & ZANTAC.

## 2012-03-25 NOTE — Assessment & Plan Note (Signed)
REPEAT BARIUM PILL ESOPHAGRAM.  CONTINUE DEXILANT.  OPV IN 6 MOS.

## 2012-03-25 NOTE — Progress Notes (Signed)
  Subjective:    Patient ID: [name], female    DOB: 01-Apr-1966, 46 y.o.   MRN: [id number]  PCP: [place] Charon  HPI Pain and swallowing problems comes and goes. Pain once a week: lasts minutes. Problems swallowing: gets choked-1x/wk not associated. No problems with drinks. More with her saliva.   Got her new chair. Bms: good. LIKES MOVIES.  Past Medical History  Diagnosis Date  . GERD (gastroesophageal reflux disease) 2007    BPE NL ESO, REFLUX  . Dysphagia 2003    2O to GERD/HH, ?difficulty with large food bolus due to neuromuscular weakness  . Constipation - functional   . Hiatal hernia   . HTN (hypertension)   . Cerebral palsy   . Epigastric pain MAY 2012 ABD [place] NL GBLIVPAN    2o to GERD V. NON-ULCER DYSPEPSIA    Past Surgical History  Procedure Date  . Colonoscopy APR 2009 with proprofol    SLF: SIMPLE ADENOMAS  . Upper gastrointestinal endoscopy MAY 2012 DIL 16 mm    SLF: NO DEFINITE STRICTURE APPRECIATED  . Upper gastrointestinal endoscopy FEB 2009-HYPOXIA REQUIRING NARCAN, & VERSED, D50V4    SLF: NL ESO, FUNDIC GLAND POLYPS  . Upper gastrointestinal endoscopy w/ DIL 2003, 2004, 2006   No Known Allergies  Current Outpatient Prescriptions  Medication Sig Dispense Refill  . DEXILANT 60 MG capsule TAKE 1 CAPSULE IN THE MORNING    . dicyclomine (BENTYL) 10 MG capsule Take 1 capsule (10 mg total) bid    . lisinopril (PRINIVIL,ZESTRIL) 2.5 MG tablet Take 2.5 mg by mouth daily.      . metoCLOPramide (REGLAN) 5 MG tablet 1 PO 30 MINUTES PRIOR TO SUPPER AND AT BEDTIME    . polyethylene glycol powder (GLYCOLAX/MIRALAX) powder USE 17 GRAMS BY MOUTH DAILY AS NEEDED Rarely uses it   . pravastatin (PRAVACHOL) 20 MG tablet Take 20 mg by mouth daily.       . ranitidine (ZANTAC) 300 MG tablet Take 300 mg by mouth at bedtime.             Review of Systems     Objective:   Physical Exam  Vitals reviewed. Constitutional: She is oriented to person, place, and time. She appears  well-nourished. No distress.  HENT:  Head: Normocephalic and atraumatic.  Cardiovascular: Normal rate, regular rhythm and normal heart sounds.   Pulmonary/Chest: Effort normal and breath sounds normal. No respiratory distress.  Abdominal: Soft. Bowel sounds are normal. She exhibits no distension. There is no tenderness.       EXAM LIMITED-PT IN [facility]  Neurological: She is alert and oriented to person, place, and time.  Psychiatric: She has a normal mood and affect.          Assessment & Plan:

## 2012-03-25 NOTE — Patient Instructions (Addendum)
COMPLETE BARIUM STUDY. I WILL CALL YOU WITH THE RESULTS.  CONTINUE DEXILANT, BENTYL, ZANTAC, AND REGLAN.  FOLLOW A SOFT MECHANICAL/LOW FTA DIET.  FOLLOW UP IN 6 MOS.

## 2012-03-25 NOTE — Assessment & Plan Note (Signed)
SX CONTROLLED WITH REGLAN AND DEXILANT.  CONTINUE MEDS.

## 2012-03-25 NOTE — Telephone Encounter (Signed)
OPENED IN ERROR

## 2012-03-25 NOTE — Progress Notes (Signed)
Faxed to PCP

## 2012-03-26 NOTE — Progress Notes (Signed)
Reminder in epic to follow up in 6 months with SF in E30 °

## 2012-03-30 ENCOUNTER — Ambulatory Visit (HOSPITAL_COMMUNITY)
Admission: RE | Admit: 2012-03-30 | Discharge: 2012-03-30 | Disposition: A | Discharge status: Home / Self Care | Payer: PRIVATE HEALTH INSURANCE | Source: Ambulatory Visit | Attending: Gastroenterology | Admitting: Gastroenterology

## 2012-03-30 DIAGNOSIS — G809 Cerebral palsy, unspecified: Secondary | ICD-10-CM | POA: Insufficient documentation

## 2012-03-30 DIAGNOSIS — K449 Diaphragmatic hernia without obstruction or gangrene: Secondary | ICD-10-CM | POA: Insufficient documentation

## 2012-03-30 DIAGNOSIS — K219 Gastro-esophageal reflux disease without esophagitis: Secondary | ICD-10-CM | POA: Insufficient documentation

## 2012-03-30 DIAGNOSIS — R1310 Dysphagia, unspecified: Secondary | ICD-10-CM

## 2012-03-30 DIAGNOSIS — K224 Dyskinesia of esophagus: Secondary | ICD-10-CM | POA: Insufficient documentation

## 2012-03-30 NOTE — Progress Notes (Signed)
Called pt's sister, Kimika Streater at [phone] and left message on machine for a return call.

## 2012-03-30 NOTE — Progress Notes (Signed)
PLEASE CALL PT'S FAMILY.  HER BARIUM SWALLOW DOES NOT SHOW A DEFINITE STRICTURE. SHE NEEDS TO GO TO [facility] TO HAVE HER SWALLOWING EVALUATED BY ESOPHAGEAL MANOMETRY. SHE NEEDS NPV WITH [facility] AS WELL FOR DYSPHAGIA.

## 2012-03-30 NOTE — Progress Notes (Signed)
Faxed to PCP

## 2012-03-31 ENCOUNTER — Other Ambulatory Visit (HOSPITAL_COMMUNITY): Payer: PRIVATE HEALTH INSURANCE

## 2012-03-31 NOTE — Progress Notes (Signed)
Pt's sister, Terrilyn Saver returned call and was informed. She said she can be reached at [phone]. This is her cell number and it is easier to get her on that one.

## 2012-03-31 NOTE — Progress Notes (Signed)
Referral has been faxed to [facility] and they will call and set the appointment up with the patient

## 2012-04-02 NOTE — Progress Notes (Signed)
Called [place] and told her that [facility] would be calling to schedule pt.

## 2012-04-25 ENCOUNTER — Other Ambulatory Visit: Payer: Self-pay | Admitting: Gastroenterology

## 2012-06-21 ENCOUNTER — Other Ambulatory Visit: Payer: Self-pay | Admitting: Gastroenterology

## 2012-06-27 ENCOUNTER — Other Ambulatory Visit: Payer: Self-pay | Admitting: Gastroenterology

## 2012-08-07 ENCOUNTER — Other Ambulatory Visit: Payer: Self-pay | Admitting: Gastroenterology

## 2012-09-16 ENCOUNTER — Ambulatory Visit (INDEPENDENT_AMBULATORY_CARE_PROVIDER_SITE_OTHER): Payer: PRIVATE HEALTH INSURANCE | Admitting: Family Medicine

## 2012-09-16 ENCOUNTER — Encounter: Payer: Self-pay | Admitting: Family Medicine

## 2012-09-16 VITALS — BP 142/62 | Temp 98.7°F | Ht 62.0 in | Wt 123.0 lb

## 2012-09-16 DIAGNOSIS — E1159 Type 2 diabetes mellitus with other circulatory complications: Secondary | ICD-10-CM | POA: Insufficient documentation

## 2012-09-16 DIAGNOSIS — E1169 Type 2 diabetes mellitus with other specified complication: Secondary | ICD-10-CM | POA: Insufficient documentation

## 2012-09-16 DIAGNOSIS — I10 Essential (primary) hypertension: Secondary | ICD-10-CM | POA: Insufficient documentation

## 2012-09-16 DIAGNOSIS — R739 Hyperglycemia, unspecified: Secondary | ICD-10-CM

## 2012-09-16 DIAGNOSIS — E785 Hyperlipidemia, unspecified: Secondary | ICD-10-CM

## 2012-09-16 DIAGNOSIS — K219 Gastro-esophageal reflux disease without esophagitis: Secondary | ICD-10-CM

## 2012-09-16 DIAGNOSIS — E559 Vitamin D deficiency, unspecified: Secondary | ICD-10-CM

## 2012-09-16 DIAGNOSIS — R7309 Other abnormal glucose: Secondary | ICD-10-CM

## 2012-09-16 DIAGNOSIS — Z1239 Encounter for other screening for malignant neoplasm of breast: Secondary | ICD-10-CM

## 2012-09-16 DIAGNOSIS — G809 Cerebral palsy, unspecified: Secondary | ICD-10-CM

## 2012-09-16 LAB — COMPLETE METABOLIC PANEL WITH GFR
ALT: 16 U/L (ref 0–35)
AST: 15 U/L (ref 0–37)
Albumin: 4.2 g/dL (ref 3.5–5.2)
Alkaline Phosphatase: 76 U/L (ref 39–117)
BUN: 9 mg/dL (ref 6–23)
CO2: 27 mEq/L (ref 19–32)
Calcium: 9.2 mg/dL (ref 8.4–10.5)
Chloride: 102 mEq/L (ref 96–112)
Creat: 0.54 mg/dL (ref 0.50–1.10)
GFR, Est African American: >89 mL/min
GFR, Est Non African American: >89 mL/min
Glucose, Bld: 81 mg/dL (ref 70–99)
Potassium: 4.1 mEq/L (ref 3.5–5.3)
Sodium: 137 mEq/L (ref 135–145)
Total Bilirubin: 0.3 mg/dL (ref 0.3–1.2)
Total Protein: 7.3 g/dL (ref 6.0–8.3)

## 2012-09-16 LAB — POCT GLYCOSYLATED HEMOGLOBIN (HGB A1C): Hemoglobin A1C: 5.7

## 2012-09-16 NOTE — Progress Notes (Signed)
Patient ID: [name], female   DOB: Nov 16, 1965, 47 y.o.   MRN: [id number] SUBJECTIVE: HPI:  Patient is here for follow up of hypertension/ CP/GERD: Overall doing very well. Came to have labs and recheck.  denies Headache;deniesChest Pain;denies weakness;denies Shortness of Breath or Orthopnea;denies Visual changes;denies palpitations;denies cough;denies pedal edema;denies symptoms of TIA or stroke; admits to Compliance with medications. denies Problems with medications.  No skin breaks.Her caretaker says patient is doing very well.  PMH/PSH: reviewed/updated in Epic  SH/FH: reviewed/updated in Epic  Allergies: reviewed/updated in Epic  Medications: reviewed/updated in Epic  Immunizations: reviewed/updated in Epic  ROS: As above in the HPI. All other systems are stable or negative.  OBJECTIVE: APPEARANCE:  AA Female in a wheelchair; uncoordinated extremities.dysarthria. Features of cerebral Palsy. Patient in no acute distress.The patient appeared well nourished and normally developed. Acyanotic. Waist: VITAL SIGNS:BP 142/62  Temp(Src) 98.7 F (37.1 C) (Oral)  Ht 5\' 2"  (1.575 m)  Wt 123 lb (55.792 kg)  BMI 22.49 kg/m2  LMP 09/16/2012   SKIN: warm and  Dry without overt rashes, tattoos and scars  HEAD and Neck: without JVD, Head and scalp: normal Eyes:No scleral icterus. Fundi normal, eye movements normal. Ears: Auricle normal, canal normal, Tympanic membranes normal, insufflation normal. Nose: normal Throat: normal Neck & thyroid: normal  CHEST & LUNGS: Chest wall: normal Lungs: Clear  CVS: Reveals the PMI to be normally located. Regular rhythm, First and Second Heart sounds are normal,  absence of murmurs, rubs or gallops. Peripheral vasculature: Radial pulses: normal Dorsal pedis pulses: normal Posterior pulses: normal  ABDOMEN:  Appearance: normal Benign,, no organomegaly, no masses, no Abdominal Aortic enlargement. No Guarding , no rebound. No  Bruits. Bowel sounds: normal  RECTAL: N/A GU: N/A  EXTREMETIES: nonedematous. Both Femoral and Pedal pulses are normal.  MUSCULOSKELETAL: deformities unchanged  NEUROLOGIC: oriented to place and person; Uncoordinated. Dysarthria not new  ASSESSMENT: HTN (hypertension) - Plan: COMPLETE METABOLIC PANEL WITH GFR  HLD (hyperlipidemia) - Plan: COMPLETE METABOLIC PANEL WITH GFR, NMR Lipoprofile with Lipids  CEREBRAL PALSY  Vitamin D deficiency - Plan: Vitamin D 25 hydroxy  Hyperglycemia - Plan: POCT glycosylated hemoglobin (Hb A1C)  GERD  PLAN: Orders Placed This Encounter  Procedures  . COMPLETE METABOLIC PANEL WITH GFR  . NMR Lipoprofile with Lipids  . Vitamin D 25 hydroxy  . POCT glycosylated hemoglobin (Hb A1C)   Meds ordered this encounter  Medications  . Multiple Vitamin (MULTIVITAMIN) tablet    Sig: Take 1 tablet by mouth daily.   Results for orders placed in visit on 09/16/12  POCT GLYCOSYLATED HEMOGLOBIN (HGB A1C)      Result Value Range   Hemoglobin A1C 57%     Continue present regimen. Mammogram due.referral to be made. RTC 3 months.  Oddis Westling P. [place] Charon, M.D.

## 2012-09-17 LAB — VITAMIN D 25 HYDROXY (VIT D DEFICIENCY, FRACTURES): Vit D, 25-Hydroxy: 30 ng/mL (ref 30–89)

## 2012-09-18 LAB — NMR LIPOPROFILE WITH LIPIDS
Cholesterol, Total: 161 mg/dL (ref <200)
HDL Particle Number: 38.9 umol/L (ref >=30.5)
HDL Size: 9.6 nm (ref >=9.2)
HDL-C: 68 mg/dL (ref >=40)
LDL (calc): 81 mg/dL (ref <100)
LDL Particle Number: 1193 nmol/L — ABNORMAL HIGH (ref <1000)
LDL Size: 21.1 nm (ref >20.5)
LP-IR Score: 34 (ref <=45)
Large HDL-P: 10.9 umol/L (ref >=4.8)
Large VLDL-P: 1.8 nmol/L (ref <=2.7)
Small LDL Particle Number: 399 nmol/L (ref <=527)
Triglycerides: 58 mg/dL (ref <150)
VLDL Size: 52.4 nm — ABNORMAL HIGH (ref <=46.6)

## 2012-09-20 NOTE — Progress Notes (Signed)
Quick Note:  Lab result at goal. The lab needs to correct the number entry for the HGBA1C.  No change in Medications for now. No Change in plans and follow up. ______

## 2012-09-24 ENCOUNTER — Encounter: Payer: Self-pay | Admitting: Gastroenterology

## 2012-10-17 ENCOUNTER — Other Ambulatory Visit: Payer: Self-pay | Admitting: Family Medicine

## 2012-10-19 ENCOUNTER — Other Ambulatory Visit: Payer: Self-pay | Admitting: Family Medicine

## 2012-10-19 MED ORDER — LISINOPRIL 2.5 MG PO TABS: ORAL_TABLET | ORAL | Status: DC

## 2012-10-19 NOTE — Telephone Encounter (Signed)
I believe she is one of your pts.

## 2012-11-18 ENCOUNTER — Encounter: Payer: Self-pay | Admitting: *Deleted

## 2012-11-25 ENCOUNTER — Emergency Department (HOSPITAL_COMMUNITY): Payer: No Typology Code available for payment source

## 2012-11-25 ENCOUNTER — Emergency Department (HOSPITAL_COMMUNITY)
Admission: EM | Admit: 2012-11-25 | Discharge: 2012-11-25 | Disposition: A | Discharge status: Home / Self Care | Payer: No Typology Code available for payment source | Attending: Emergency Medicine | Admitting: Emergency Medicine

## 2012-11-25 ENCOUNTER — Encounter (HOSPITAL_COMMUNITY): Payer: Self-pay | Admitting: Emergency Medicine

## 2012-11-25 DIAGNOSIS — I10 Essential (primary) hypertension: Secondary | ICD-10-CM | POA: Insufficient documentation

## 2012-11-25 DIAGNOSIS — Z8669 Personal history of other diseases of the nervous system and sense organs: Secondary | ICD-10-CM | POA: Insufficient documentation

## 2012-11-25 DIAGNOSIS — Z79899 Other long term (current) drug therapy: Secondary | ICD-10-CM | POA: Insufficient documentation

## 2012-11-25 DIAGNOSIS — S199XXA Unspecified injury of neck, initial encounter: Secondary | ICD-10-CM | POA: Insufficient documentation

## 2012-11-25 DIAGNOSIS — S0993XA Unspecified injury of face, initial encounter: Secondary | ICD-10-CM | POA: Insufficient documentation

## 2012-11-25 DIAGNOSIS — Y92410 Unspecified street and highway as the place of occurrence of the external cause: Secondary | ICD-10-CM | POA: Insufficient documentation

## 2012-11-25 DIAGNOSIS — R5381 Other malaise: Secondary | ICD-10-CM | POA: Insufficient documentation

## 2012-11-25 DIAGNOSIS — S39012A Strain of muscle, fascia and tendon of lower back, initial encounter: Secondary | ICD-10-CM

## 2012-11-25 DIAGNOSIS — R5383 Other fatigue: Secondary | ICD-10-CM | POA: Insufficient documentation

## 2012-11-25 DIAGNOSIS — Z8719 Personal history of other diseases of the digestive system: Secondary | ICD-10-CM | POA: Insufficient documentation

## 2012-11-25 DIAGNOSIS — Y939 Activity, unspecified: Secondary | ICD-10-CM | POA: Insufficient documentation

## 2012-11-25 DIAGNOSIS — S335XXA Sprain of ligaments of lumbar spine, initial encounter: Secondary | ICD-10-CM | POA: Insufficient documentation

## 2012-11-25 DIAGNOSIS — K219 Gastro-esophageal reflux disease without esophagitis: Secondary | ICD-10-CM | POA: Insufficient documentation
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
## 2012-11-26 ENCOUNTER — Telehealth: Payer: Self-pay | Admitting: Family Medicine

## 2012-11-26 NOTE — Telephone Encounter (Signed)
Pt aware will need to see a provider for an assessment.

## 2012-12-02 ENCOUNTER — Encounter (HOSPITAL_COMMUNITY): Payer: Self-pay | Admitting: Emergency Medicine

## 2012-12-02 ENCOUNTER — Emergency Department (INDEPENDENT_AMBULATORY_CARE_PROVIDER_SITE_OTHER)
Admission: EM | Admit: 2012-12-02 | Discharge: 2012-12-02 | Disposition: A | Discharge status: Home / Self Care | Payer: Self-pay | Source: Home / Self Care | Attending: Family Medicine | Admitting: Family Medicine

## 2012-12-02 DIAGNOSIS — M545 Low back pain: Secondary | ICD-10-CM

## 2012-12-02 MED ORDER — CYCLOBENZAPRINE HCL 10 MG PO TABS: 10.0000 mg | ORAL_TABLET | Freq: Two times a day (BID) | ORAL | Status: DC | PRN

## 2012-12-02 MED ORDER — IBUPROFEN 600 MG PO TABS: 600.0000 mg | ORAL_TABLET | Freq: Three times a day (TID) | ORAL | Status: DC | PRN

## 2012-12-02 MED ORDER — HYDROCODONE-ACETAMINOPHEN 5-325 MG PO TABS: 2.0000 | ORAL_TABLET | Freq: Three times a day (TID) | ORAL | Status: DC | PRN
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
## 2012-12-03 ENCOUNTER — Other Ambulatory Visit: Payer: Self-pay | Admitting: Family Medicine

## 2012-12-07 ENCOUNTER — Telehealth: Payer: Self-pay | Admitting: Family Medicine

## 2012-12-07 NOTE — Telephone Encounter (Signed)
Please call to clarify Rx at pharmacy and patient.

## 2012-12-07 NOTE — Telephone Encounter (Signed)
Not sure which pt is on, see if you can figure this from her notes and approve the one that is right

## 2012-12-08 ENCOUNTER — Encounter: Payer: Self-pay | Admitting: Family Medicine

## 2012-12-08 ENCOUNTER — Other Ambulatory Visit: Payer: Self-pay | Admitting: Family Medicine

## 2012-12-08 ENCOUNTER — Ambulatory Visit (INDEPENDENT_AMBULATORY_CARE_PROVIDER_SITE_OTHER): Payer: PRIVATE HEALTH INSURANCE | Admitting: Family Medicine

## 2012-12-08 VITALS — BP 144/94 | HR 84 | Temp 98.6°F

## 2012-12-08 DIAGNOSIS — IMO0002 Reserved for concepts with insufficient information to code with codable children: Secondary | ICD-10-CM

## 2012-12-08 DIAGNOSIS — G809 Cerebral palsy, unspecified: Secondary | ICD-10-CM

## 2012-12-08 DIAGNOSIS — E785 Hyperlipidemia, unspecified: Secondary | ICD-10-CM

## 2012-12-08 DIAGNOSIS — S39012S Strain of muscle, fascia and tendon of lower back, sequela: Secondary | ICD-10-CM

## 2012-12-08 DIAGNOSIS — I10 Essential (primary) hypertension: Secondary | ICD-10-CM

## 2012-12-08 MED ORDER — ACETAMINOPHEN-CODEINE #3 300-30 MG PO TABS: 1.0000 | ORAL_TABLET | ORAL | Status: DC | PRN

## 2012-12-08 MED ORDER — CYCLOBENZAPRINE HCL 10 MG PO TABS: 10.0000 mg | ORAL_TABLET | Freq: Two times a day (BID) | ORAL | Status: DC | PRN

## 2012-12-08 MED ORDER — PRAVASTATIN SODIUM 40 MG PO TABS: 40.0000 mg | ORAL_TABLET | Freq: Every day | ORAL | Status: DC
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
## 2012-12-08 NOTE — Telephone Encounter (Signed)
Spoke with [name] at [facility] on Pravastatin 40mg   Pt notified and left on pt voice mail she is on pravastatin and refill called in and to call if further questions    Please send in refill

## 2012-12-15 ENCOUNTER — Ambulatory Visit: Payer: PRIVATE HEALTH INSURANCE | Admitting: Physical Therapy

## 2012-12-22 ENCOUNTER — Other Ambulatory Visit (HOSPITAL_COMMUNITY): Payer: Self-pay | Admitting: Family Medicine

## 2012-12-22 ENCOUNTER — Ambulatory Visit (HOSPITAL_COMMUNITY)
Admission: RE | Admit: 2012-12-22 | Discharge: 2012-12-22 | Disposition: A | Discharge status: Home / Self Care | Payer: No Typology Code available for payment source | Source: Ambulatory Visit | Attending: Family Medicine | Admitting: Family Medicine

## 2012-12-22 DIAGNOSIS — M545 Low back pain, unspecified: Secondary | ICD-10-CM | POA: Insufficient documentation

## 2012-12-22 DIAGNOSIS — IMO0001 Reserved for inherently not codable concepts without codable children: Secondary | ICD-10-CM | POA: Insufficient documentation

## 2012-12-22 DIAGNOSIS — I10 Essential (primary) hypertension: Secondary | ICD-10-CM | POA: Insufficient documentation
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
## 2012-12-23 ENCOUNTER — Ambulatory Visit: Payer: PRIVATE HEALTH INSURANCE | Admitting: Family Medicine

## 2012-12-24 ENCOUNTER — Ambulatory Visit: Payer: PRIVATE HEALTH INSURANCE | Admitting: Family Medicine

## 2012-12-24 ENCOUNTER — Ambulatory Visit (HOSPITAL_COMMUNITY)
Admission: RE | Admit: 2012-12-24 | Discharge: 2012-12-24 | Disposition: A | Discharge status: Home / Self Care | Payer: No Typology Code available for payment source | Source: Ambulatory Visit | Attending: Physical Therapy | Admitting: Physical Therapy

## 2012-12-24 NOTE — Progress Notes (Signed)
Physical Therapy Treatment Patient Details  Name: [name] MRN: [id number] Date of Birth: 03/01/1966  Today's Date: 12/24/2012 Time: 7829-5621 PT Time Calculation (min): 40 min Visit#: 2 of 8  Re-eval: 01/21/13 Charges:  therex [phone] (30'), theract [phone] (8') Authorization: [employer]  Authorization Visit#: 2 of 8   Subjective: Symptoms/Limitations Symptoms: Pt states she has 5/10 pain today in her lower back.  Reports compliance with HEP with help from her sister.  Pain Assessment Currently in Pain?: Yes Pain Score: 5  Pain Location: Back Pain Orientation: Lower   Exercise/Treatments Stretches Passive Hamstring Stretch: 3 reps;30 seconds Single Knee to Chest Stretch: 5 reps;Limitations Single Knee to Chest Stretch Limitations: PROM sciatic nerve glides Lower Trunk Rotation: 5 reps Supine Ab Set: 10 reps Heel Slides: 10 reps Bridge: 10 reps Straight Leg Raise: 10 reps Other Supine Lumbar Exercises: hip adduction isometric 10 reps   Modalities Modalities: Moist Heat Moist Heat Therapy Number Minutes Moist Heat: 20 Minutes Moist Heat Location: Other (comment)  Physical Therapy Assessment and Plan PT Assessment and Plan Clinical Impression Statement: Noticed brakes not locking properly on wheelchair.  Adjusted brakes to increase safely.  Encouraged sister to have pt push up from chair arms with transfer rather than reach for assistance.   Instruced with new stability and LE exercises in supine.  Added MHP during therex to help decrease pain and relax muscles.   PT requires AA wtih most exercises to perform correctly and with increased control. PT Frequency: Min 2X/week PT Duration: 4 weeks PT Plan: Continue to progress lumbar stabilization and LE strength, transfer and general safety.     Problem List Patient Active Problem List   Diagnosis Date Noted  . HLD (hyperlipidemia) 09/16/2012  . Vitamin D deficiency 09/16/2012  . Hyperglycemia  09/16/2012  . HTN (hypertension) 09/16/2012  . Odynophagia 10/04/2010  . DIARRHEA 12/04/2009  . 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 ABDOMINAL PAIN 11/21/2009  . MELENA 09/13/2009  . EPIGASTRIC PAIN 09/13/2009  . DENTAL PAIN 07/11/2009  . CEREBRAL PALSY 11/23/2008  . HYPERTENSION 11/23/2008  . GERD 11/23/2008  . HIATAL HERNIA 11/23/2008  . CONSTIPATION 11/23/2008  . DYSPHAGIA UNSPECIFIED 11/23/2008     Lurena Nida, PTA/CLT 12/24/2012, 5:44 PM

## 2012-12-29 ENCOUNTER — Ambulatory Visit (HOSPITAL_COMMUNITY)
Admission: RE | Admit: 2012-12-29 | Discharge: 2012-12-29 | Disposition: A | Discharge status: Home / Self Care | Payer: No Typology Code available for payment source | Source: Ambulatory Visit | Attending: Family Medicine | Admitting: Family Medicine

## 2012-12-29 NOTE — Progress Notes (Signed)
Physical Therapy Treatment Patient Details  Name: MAZELL AYLESWORTH MRN: [id number] Date of Birth: [birth year]  Today's Date: 12/29/2012 Time: 4098-1191 PT Time Calculation (min): 40 min Charges: Therex x 30 (1520-1550) Self-care x 8' (1550-1558)  Visit#: 3 of 8  Re-eval: 01/21/13  Authorization: [place] [employer] medicare  Authorization Visit#: 3 of 8   Subjective: Symptoms/Limitations Symptoms: Pt states that she is not currently having any pain. She states exercises are helping. Pain Assessment Currently in Pain?: No/denies   Exercise/Treatments Stretches Passive Hamstring Stretch: 3 reps;30 seconds Single Knee to Chest Stretch: 5 reps;10 seconds Single Knee to Chest Stretch Limitations: PROM sciatic nerve glides Lower Trunk Rotation: 5 reps Supine Ab Set: 10 reps Clam: 10 reps Heel Slides: 10 reps Bridge: 10 reps Straight Leg Raise: 10 reps Other Supine Lumbar Exercises: hip adduction isometric 10 reps  Physical Therapy Assessment and Plan PT Assessment and Plan Clinical Impression Statement: Pt competes therex well with manual assistance to improve control. Pt appears to be responding well to exercises, reporting decreased pain after completing them. Pt's sister tells therapist that pt is compliant with exercises at home. She says that she complains of pain more in the mornings and in the evenings. Suggested to have pt complete HEP at these times to decrease pain. Pt reports no pain at end of session. PT Frequency: Min 2X/week PT Duration: 4 weeks PT Plan: Continue to progress lumbar stabilization and LE strength, transfer and general safety.     Problem List Patient Active Problem List   Diagnosis Date Noted  . HLD (hyperlipidemia) 09/16/2012  . Vitamin D deficiency 09/16/2012  . Hyperglycemia 09/16/2012  . HTN (hypertension) 09/16/2012  . Odynophagia 10/04/2010  . DIARRHEA 12/04/2009  . 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 ABDOMINAL PAIN 11/21/2009  . MELENA  09/13/2009  . EPIGASTRIC PAIN 09/13/2009  . DENTAL PAIN 07/11/2009  . CEREBRAL PALSY 11/23/2008  . HYPERTENSION 11/23/2008  . GERD 11/23/2008  . HIATAL HERNIA 11/23/2008  . CONSTIPATION 11/23/2008  . DYSPHAGIA UNSPECIFIED 11/23/2008   Seth Bake, PTA 12/29/2012, 4:25 PM

## 2012-12-31 ENCOUNTER — Ambulatory Visit (HOSPITAL_COMMUNITY)
Admission: RE | Admit: 2012-12-31 | Discharge: 2012-12-31 | Disposition: A | Discharge status: Home / Self Care | Payer: No Typology Code available for payment source | Source: Ambulatory Visit | Attending: Family Medicine | Admitting: Family Medicine

## 2012-12-31 ENCOUNTER — Encounter: Payer: Self-pay | Admitting: *Deleted

## 2012-12-31 NOTE — Progress Notes (Signed)
Physical Therapy Treatment Patient Details  Name: RAYLINN KOSAR MRN: [id number] Date of Birth: 07/21/1965  Today's Date: 12/31/2012 Time: 9147-8295 PT Time Calculation (min): 34 min  Visit#: 4 of 8  Re-eval: 01/21/13 Charges: Therex x 32' [phone])  Authorization: [place] [employer] medicare  Authorization Visit#: 4 of 8   Subjective: Symptoms/Limitations Symptoms: Pt reports continued HEP compliance.  Pain Assessment Currently in Pain?: Yes Pain Score: 4  Pain Location: Back Pain Orientation: Lower   Exercise/Treatments Stretches Passive Hamstring Stretch: 3 reps;30 seconds Single Knee to Chest Stretch: 3 reps;30 seconds Lower Trunk Rotation: 5 reps;10 seconds Supine Ab Set: 10 reps Clam: 10 reps Bent Knee Raise: 10 reps Bridge: 10 reps Straight Leg Raise: 10 reps Other Supine Lumbar Exercises: hip add/abd isometric 10 reps each Other Supine Lumbar Exercises: Active hip abd/add x 10  Physical Therapy Assessment and Plan PT Assessment and Plan Clinical Impression Statement: Pt displays improved strength and control with exercises. Progress hip/core strengthening exercises with minimal difficulty. Pt's pain is elevated today. Pt has no change in pain at end of session. Instructed sister to apply heat once she is home to decrease pain/soreness. PT Frequency: Min 2X/week PT Duration: 4 weeks PT Plan: Continue to progress lumbar stabilization and LE strength, transfer and general safety.     Problem List Patient Active Problem List   Diagnosis Date Noted  . HLD (hyperlipidemia) 09/16/2012  . Vitamin D deficiency 09/16/2012  . Hyperglycemia 09/16/2012  . HTN (hypertension) 09/16/2012  . Odynophagia 10/04/2010  . DIARRHEA 12/04/2009  . 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 ABDOMINAL PAIN 11/21/2009  . MELENA 09/13/2009  . EPIGASTRIC PAIN 09/13/2009  . DENTAL PAIN 07/11/2009  . CEREBRAL PALSY 11/23/2008  . HYPERTENSION 11/23/2008  . GERD 11/23/2008  . HIATAL  HERNIA 11/23/2008  . CONSTIPATION 11/23/2008  . DYSPHAGIA UNSPECIFIED 11/23/2008    PT - End of Session Activity Tolerance: Patient tolerated treatment well General Behavior During Therapy: [facility] for tasks assessed/performed  Seth Bake, PTA  12/31/2012, 5:16 PM

## 2013-01-05 ENCOUNTER — Ambulatory Visit (HOSPITAL_COMMUNITY)
Admission: RE | Admit: 2013-01-05 | Discharge: 2013-01-05 | Disposition: A | Discharge status: Home / Self Care | Payer: No Typology Code available for payment source | Source: Ambulatory Visit | Attending: Family Medicine | Admitting: Family Medicine

## 2013-01-05 NOTE — Progress Notes (Signed)
Physical Therapy Treatment Patient Details  Name: [name] MRN: [id number] Date of Birth: [birth year]  Today's Date: 01/05/2013 Time: 1352-1430 PT Time Calculation (min): 38 min Visit#: 5 of 8  Re-eval: 01/21/13 Authorization Visit#: 5 of 8  Charges:  therex 38'  Subjective: Symptoms/Limitations Symptoms: Pt reports 4/10 pain today in lower lumbar area.  Pt agrees that having an overall improvment in symptoms since beginning therapy. Pain Assessment Currently in Pain?: Yes Pain Score: 4  Pain Location: Back   Exercise/Treatments Stretches Passive Hamstring Stretch: 3 reps;30 seconds Single Knee to Chest Stretch: 3 reps;30 seconds Single Knee to Chest Stretch Limitations: PROM sciatic nerve glides Lower Trunk Rotation: 5 reps;10 seconds Supine Ab Set: 10 reps Clam: 10 reps Heel Slides: 10 reps Bridge: 15 reps Straight Leg Raise: 15 reps Other Supine Lumbar Exercises: Active hip abd/add x 10   Modalities Modalities: Moist Heat Moist Heat Therapy Number Minutes Moist Heat: 22 Minutes  Physical Therapy Assessment and Plan PT Assessment and Plan PT Assessment:  Pt with slightly less tone and improved coordination with therex /stretching today. MHP helps to relax lumbar mm with therex. PT Plan: Continue to progress lumbar stabilization and LE strength, transfer and general safety.     Problem List Patient Active Problem List   Diagnosis Date Noted  . HLD (hyperlipidemia) 09/16/2012  . Vitamin D deficiency 09/16/2012  . Hyperglycemia 09/16/2012  . HTN (hypertension) 09/16/2012  . Odynophagia 10/04/2010  . DIARRHEA 12/04/2009  . 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 ABDOMINAL PAIN 11/21/2009  . MELENA 09/13/2009  . EPIGASTRIC PAIN 09/13/2009  . DENTAL PAIN 07/11/2009  . CEREBRAL PALSY 11/23/2008  . HYPERTENSION 11/23/2008  . GERD 11/23/2008  . HIATAL HERNIA 11/23/2008  . CONSTIPATION 11/23/2008  . DYSPHAGIA UNSPECIFIED 11/23/2008      Lurena Nida,  PTA/CLT 01/05/2013, 2:38 PM

## 2013-01-07 ENCOUNTER — Ambulatory Visit (HOSPITAL_COMMUNITY)
Admission: RE | Admit: 2013-01-07 | Discharge: 2013-01-07 | Disposition: A | Discharge status: Home / Self Care | Payer: No Typology Code available for payment source | Source: Ambulatory Visit | Attending: Family Medicine | Admitting: Family Medicine

## 2013-01-07 NOTE — Progress Notes (Signed)
Physical Therapy Treatment Patient Details  Name: [name] MRN: [id number] Date of Birth: 1965/07/23  Today's Date: 01/07/2013 Time: 1520-1600 PT Time Calculation (min): 40 min Charges: Therex x 26'(1520-1546) Manual x 12'(1546-1558)  Visit#: 6 of 8  Re-eval: 01/21/13   Authorization: Medicare Authorization Visit#: 6 of 8   Subjective: Symptoms/Limitations Symptoms: Pt states that her pain has been elevated all day. Pain Assessment Currently in Pain?: Yes Pain Score: 8  Pain Location: Back Pain Orientation: Lower   Exercise/Treatments Stretches Lower Trunk Rotation: 5 reps;10 seconds Supine Ab Set: 10 reps Clam: 10 reps Bridge: 15 reps Straight Leg Raise: 15 reps  Modalities Modalities: Moist Heat Manual Therapy Manual Therapy: Other (comment) Other Manual Therapy: Manual stretching of BLE; gentle manual lumbar traction  Moist Heat Therapy Number Minutes Moist Heat:  (With therex) Moist Heat Location: Other (comment) (Lumbar)  Physical Therapy Assessment and Plan PT Assessment and Plan Clinical Impression Statement: Pt has elevated pain this session. Pt receives relief from manual lumbar traction. MHP applied during therex. New HEP given and pt's family educated on it. Pt reports no change in pain at end of session. PT Plan: Continue to progress lumbar stabilization and LE strength, transfer and general safety.     Problem List Patient Active Problem List   Diagnosis Date Noted  . HLD (hyperlipidemia) 09/16/2012  . Vitamin D deficiency 09/16/2012  . Hyperglycemia 09/16/2012  . HTN (hypertension) 09/16/2012  . Odynophagia 10/04/2010  . DIARRHEA 12/04/2009  . 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 ABDOMINAL PAIN 11/21/2009  . MELENA 09/13/2009  . EPIGASTRIC PAIN 09/13/2009  . DENTAL PAIN 07/11/2009  . CEREBRAL PALSY 11/23/2008  . HYPERTENSION 11/23/2008  . GERD 11/23/2008  . HIATAL HERNIA 11/23/2008  . CONSTIPATION 11/23/2008  . DYSPHAGIA  UNSPECIFIED 11/23/2008    PT - End of Session Activity Tolerance: Patient tolerated treatment well General Behavior During Therapy: [facility] for tasks assessed/performed  Seth Bake, PTA  01/07/2013, 5:13 PM

## 2013-01-13 ENCOUNTER — Ambulatory Visit: Payer: PRIVATE HEALTH INSURANCE | Admitting: Family Medicine

## 2013-01-14 ENCOUNTER — Ambulatory Visit (HOSPITAL_COMMUNITY)
Admission: RE | Admit: 2013-01-14 | Discharge: 2013-01-14 | Disposition: A | Discharge status: Home / Self Care | Payer: No Typology Code available for payment source | Source: Ambulatory Visit | Attending: Family Medicine | Admitting: Family Medicine

## 2013-01-14 DIAGNOSIS — M545 Low back pain, unspecified: Secondary | ICD-10-CM | POA: Insufficient documentation

## 2013-01-14 DIAGNOSIS — IMO0001 Reserved for inherently not codable concepts without codable children: Secondary | ICD-10-CM | POA: Insufficient documentation

## 2013-01-14 DIAGNOSIS — I10 Essential (primary) hypertension: Secondary | ICD-10-CM | POA: Insufficient documentation

## 2013-01-14 NOTE — Progress Notes (Signed)
Physical Therapy Treatment Patient Details  Name: [name] MRN: [id number] Date of Birth: 1966-03-16  Today's Date: 01/14/2013 Time: 1515-1600 PT Time Calculation (min): 45 min Visit#: 7 of 8  Re-eval: 01/21/13 Authorization Visit#: 7 of 8  Charges:  Manual 1515-1530 (15'), therex 1532-1556 (24'), MHP X 1  Subjective: Pt states her pain is improved to 4/10 today    Exercise/Treatments Stretches Passive Hamstring Stretch: 3 reps;30 seconds Single Knee to Chest Stretch: 3 reps;30 seconds Single Knee to Chest Stretch Limitations: PROM sciatic nerve glides Lower Trunk Rotation: 5 reps;10 seconds Supine Ab Set: 10 reps Clam: 10 reps Heel Slides: 10 reps Bridge: 15 reps Straight Leg Raise: 15 reps Other Supine Lumbar Exercises: bilateral LE distraction in supine 3X30" each    Modalities Modalities: Moist Heat Manual Therapy Manual Therapy: Other (comment) Other Manual Therapy: manual stretching of BLE; bilateral LE distraction  Moist Heat Therapy Number Minutes Moist Heat: 20 Minutes Moist Heat Location: Other (comment) (Lumbar)  Physical Therapy Assessment and Plan PT Assessment and Plan PT Assessment:  Continued focus on stretching BLE/lumbar mm to decrease spasm, tightness and pain.  Able to increase reps of most exercises without difficulty.  Good results from distraction of Bilateral LE's.   PT Plan: Continue to progress lumbar stabilization and LE strength, transfer and general safety.    Problem List Patient Active Problem List   Diagnosis Date Noted  . HLD (hyperlipidemia) 09/16/2012  . Vitamin D deficiency 09/16/2012  . Hyperglycemia 09/16/2012  . HTN (hypertension) 09/16/2012  . Odynophagia 10/04/2010  . DIARRHEA 12/04/2009  . 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 ABDOMINAL PAIN 11/21/2009  . MELENA 09/13/2009  . EPIGASTRIC PAIN 09/13/2009  . DENTAL PAIN 07/11/2009  . CEREBRAL PALSY 11/23/2008  . HYPERTENSION 11/23/2008  . GERD 11/23/2008  .  HIATAL HERNIA 11/23/2008  . CONSTIPATION 11/23/2008  . DYSPHAGIA UNSPECIFIED 11/23/2008    PT - End of Session Activity Tolerance: Patient tolerated treatment well General Behavior During Therapy: [facility] for tasks assessed/performed   Lurena Nida, PTA/CLT 01/14/2013, 4:02 PM

## 2013-01-18 ENCOUNTER — Ambulatory Visit (INDEPENDENT_AMBULATORY_CARE_PROVIDER_SITE_OTHER): Payer: PRIVATE HEALTH INSURANCE

## 2013-01-18 ENCOUNTER — Ambulatory Visit (INDEPENDENT_AMBULATORY_CARE_PROVIDER_SITE_OTHER): Payer: PRIVATE HEALTH INSURANCE | Admitting: Family Medicine

## 2013-01-18 ENCOUNTER — Encounter: Payer: Self-pay | Admitting: Family Medicine

## 2013-01-18 VITALS — BP 150/96 | HR 90 | Temp 97.0°F

## 2013-01-18 DIAGNOSIS — E559 Vitamin D deficiency, unspecified: Secondary | ICD-10-CM

## 2013-01-18 DIAGNOSIS — M79609 Pain in unspecified limb: Secondary | ICD-10-CM

## 2013-01-18 DIAGNOSIS — K449 Diaphragmatic hernia without obstruction or gangrene: Secondary | ICD-10-CM

## 2013-01-18 DIAGNOSIS — E785 Hyperlipidemia, unspecified: Secondary | ICD-10-CM

## 2013-01-18 DIAGNOSIS — M79602 Pain in left arm: Secondary | ICD-10-CM

## 2013-01-18 DIAGNOSIS — I10 Essential (primary) hypertension: Secondary | ICD-10-CM

## 2013-01-18 DIAGNOSIS — K219 Gastro-esophageal reflux disease without esophagitis: Secondary | ICD-10-CM

## 2013-01-18 DIAGNOSIS — R739 Hyperglycemia, unspecified: Secondary | ICD-10-CM

## 2013-01-18 DIAGNOSIS — R7309 Other abnormal glucose: Secondary | ICD-10-CM

## 2013-01-18 LAB — POCT GLYCOSYLATED HEMOGLOBIN (HGB A1C): Hemoglobin A1C: 5.7

## 2013-01-18 MED ORDER — PANTOPRAZOLE SODIUM 40 MG PO TBEC: 40.0000 mg | DELAYED_RELEASE_TABLET | Freq: Every day | ORAL | Status: DC
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
## 2013-01-19 ENCOUNTER — Ambulatory Visit (HOSPITAL_COMMUNITY)
Admission: RE | Admit: 2013-01-19 | Discharge: 2013-01-19 | Disposition: A | Discharge status: Home / Self Care | Payer: No Typology Code available for payment source | Source: Ambulatory Visit | Attending: Family Medicine | Admitting: Family Medicine

## 2013-01-19 LAB — CMP14+EGFR
ALT: 18 IU/L (ref 0–32)
AST: 17 IU/L (ref 0–40)
Albumin/Globulin Ratio: 1.8 (ref 1.1–2.5)
Albumin: 4.7 g/dL (ref 3.5–5.5)
Alkaline Phosphatase: 78 IU/L (ref 39–117)
BUN/Creatinine Ratio: 11 (ref 9–23)
BUN: 6 mg/dL (ref 6–24)
CO2: 28 mmol/L (ref 18–29)
Calcium: 9.3 mg/dL (ref 8.7–10.2)
Chloride: 99 mmol/L (ref 97–108)
Creatinine, Ser: 0.56 mg/dL — ABNORMAL LOW (ref 0.57–1.00)
GFR calc Af Amer: 127 mL/min/{1.73_m2} (ref >59)
GFR calc non Af Amer: 110 mL/min/{1.73_m2} (ref >59)
Globulin, Total: 2.6 g/dL (ref 1.5–4.5)
Glucose: 87 mg/dL (ref 65–99)
Potassium: 3.4 mmol/L — ABNORMAL LOW (ref 3.5–5.2)
Sodium: 140 mmol/L (ref 134–144)
Total Bilirubin: 0.2 mg/dL (ref 0.0–1.2)
Total Protein: 7.3 g/dL (ref 6.0–8.5)

## 2013-01-19 LAB — NMR, LIPOPROFILE
Cholesterol: 168 mg/dL (ref <200)
HDL Cholesterol by NMR: 66 mg/dL (ref >=40)
HDL Particle Number: 41.6 umol/L (ref >=30.5)
LDL Particle Number: 1292 nmol/L — ABNORMAL HIGH (ref <1000)
LDL Size: 20.2 nm — ABNORMAL LOW (ref >20.5)
LDLC SERPL CALC-MCNC: 91 mg/dL (ref <100)
LP-IR Score: <25 (ref <=45)
Small LDL Particle Number: 740 nmol/L — ABNORMAL HIGH (ref <=527)
Triglycerides by NMR: 57 mg/dL (ref <150)

## 2013-01-19 LAB — VITAMIN D 25 HYDROXY (VIT D DEFICIENCY, FRACTURES): Vit D, 25-Hydroxy: 29.1 ng/mL — ABNORMAL LOW (ref 30.0–100.0)

## 2013-01-19 NOTE — Progress Notes (Signed)
Physical Therapy Treatment Patient Details  Name: [name] MRN: [id number] Date of Birth: 04/03/1966  Today's Date: 01/19/2013 Time: 1413 (Pt taken back and put on MHP by Nicholes Rough, [phone] PT Time Calculation (min): 45 min Visit#: 8 of 8  Re-eval: 01/21/13 Authorization Visit#: 8 of 10  Charges:  therex 1418-1445 (27'), manual 1446-1458 (12')  Subjective: Symptoms/Limitations Symptoms: Pt states her back is not hurting bad today, 3/10.  States her Left shoulder has been hurting her the worst Pain Assessment Currently in Pain?: Yes Pain Score: 3  Pain Location: Back   Exercise/Treatments Stretches Passive Hamstring Stretch: 3 reps;30 seconds Single Knee to Chest Stretch: 3 reps;30 seconds Single Knee to Chest Stretch Limitations: PROM sciatic nerve glides Lower Trunk Rotation: 10 seconds;Limitations Lower Trunk Rotation Limitations: 10 reps each Supine Ab Set: 15 reps Clam: 15 reps Heel Slides: 10 reps Bridge: 15 reps Straight Leg Raise: 15 reps Other Supine Lumbar Exercises: bilateral LE distraction in supine 3X30" each     Physical Therapy Assessment and Plan PT Assessment and Plan Clinical Impression Statement: Decreased mm spasm/improving coordination with all exercises with overall decreased pain.  Increased stretch achieved in bilateral hips and hamstrings. PT Plan: Continue to progress lumbar stabilization and LE strength, transfer and general safety.  Re-evaluate next visit.   Problem List Patient Active Problem List   Diagnosis Date Noted  . HLD (hyperlipidemia) 09/16/2012  . Vitamin D deficiency 09/16/2012  . Hyperglycemia 09/16/2012  . HTN (hypertension) 09/16/2012  . Odynophagia 10/04/2010  . DIARRHEA 12/04/2009  . 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 ABDOMINAL PAIN 11/21/2009  . MELENA 09/13/2009  . EPIGASTRIC PAIN 09/13/2009  . DENTAL PAIN 07/11/2009  . CEREBRAL PALSY 11/23/2008  . HYPERTENSION 11/23/2008  . GERD 11/23/2008  .  HIATAL HERNIA 11/23/2008  . CONSTIPATION 11/23/2008  . DYSPHAGIA UNSPECIFIED 11/23/2008    PT - End of Session Activity Tolerance: Patient tolerated treatment well General Behavior During Therapy: WFL for tasks assessed/performed   Lurena Nida, PTA/CLT 01/19/2013, 4:19 PM

## 2013-01-20 NOTE — Progress Notes (Signed)
Quick Note:  Labs abnormal. Potassium a little low. Eat a banana daily. She is prediabetic.the HGBA1C is a little elevated Limit the sweets and sugars. Eat more vegetables. Will need to see her in 6 weeks to recheck the potassium and her left shoulder.  ______

## 2013-01-20 NOTE — Progress Notes (Signed)
Quick Note:  Labs abnormal. 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 The humerus was fine. If pain persists will need to recheck the shoulder instead of the arm. Thanks. Romona Murdy P. Modesto Charon, M.D.  ______

## 2013-01-21 ENCOUNTER — Ambulatory Visit (HOSPITAL_COMMUNITY)
Admission: RE | Admit: 2013-01-21 | Discharge: 2013-01-21 | Disposition: A | Discharge status: Home / Self Care | Payer: No Typology Code available for payment source | Source: Ambulatory Visit | Attending: Family Medicine | Admitting: Family Medicine

## 2013-01-21 NOTE — Evaluation (Addendum)
Physical Therapy reassessment  Patient Details  Name: [name] MRN: [id number] Date of Birth: 1965-07-18  Today's Date: 01/21/2013 Time: 4098-1191 PT Time Calculation (min): 44 min Charge 1515-1525 [place]; 1525-1543 manual; [phone] there ex             Visit#: 9 of 14  Re-eval: 01/21/13 Assessment Diagnosis: Lumbar pain  Authorization: [place] [employer] medicare    Authorization Time Period:    Authorization Visit#: 9 of 14   Past Medical History:  Past Medical History  Diagnosis Date  . GERD (gastroesophageal reflux disease) 2007    BPE NL ESO, REFLUX  . Dysphagia 2003    2O to GERD/HH, ?difficulty with large food bolus due to neuromuscular weakness  . Constipation - functional   . Hiatal hernia   . HTN (hypertension)   . Cerebral palsy   . Epigastric pain MAY 2012 ABD [place] NL GBLIVPAN    2o to GERD V. NON-ULCER DYSPEPSIA  . Hyperlipidemia    Past Surgical History:  Past Surgical History  Procedure Laterality Date  . Colonoscopy  APR 2009 with proprofol    SLF: SIMPLE ADENOMAS  . Upper gastrointestinal endoscopy  MAY 2012 DIL 16 mm    SLF: NO DEFINITE STRICTURE APPRECIATED  . Upper gastrointestinal endoscopy  FEB 2009-HYPOXIA REQUIRING NARCAN, & VERSED, D50V4    SLF: NL ESO, FUNDIC GLAND POLYPS  . Upper gastrointestinal endoscopy  w/ DIL 2003, 2004, 2006    Subjective Symptoms/Limitations Symptoms: Pt states that her back pain is down but not gone How long can you sit comfortably?: Pt is able to sit on the floor without support now but will begin having pain in an hour.  Pt was having pain after a half hour prior to back pain pt was able to sit for 2-3 hours. Pain Assessment Pain Score: 4  Pain Location: Back Pain Orientation: Lower Pain Type: Chronic pain Pain Onset: 1 to 4 weeks ago Pain Frequency: Intermittent (was constant) Pain Relieving Factors: Pt is still taking mm relaxors but does not have to take as many as she was.  Flexibility Thomas:  Positive 90/90: Positive but improved  Assessment RLE Tone RLE Tone: Moderate;Hypertonic LLE Tone LLE Tone: Moderate;Hypertonic Palpation Palpation:  (increased tone of lumbar paraspinal mm )  Exercise/Treatments  Stretches Passive Hamstring Stretch: 3 reps;30 seconds Single Knee to Chest Stretch: 3 reps;30 seconds Single Knee to Chest Stretch Limitations: PROM sciatic nerve glides Lower Trunk Rotation: 10 seconds;Limitations Lower Trunk Rotation Limitations: 10 reps each   Supine Ab Set: 15 reps Clam: 15 reps Bridge: 15 reps Other Supine Lumbar Exercises: isometric hip ab/adductionx 15    Modalities Modalities: Ultrasound Manual Therapy Manual Therapy: Myofascial release Myofascial Release: to reduce lumbar constriciotn and decrease pain Ultrasound Ultrasound Location: B lumbar Ultrasound Parameters: 1.5 w/cm2 ; 1mg Hz x 8' Ultrasound Goals: Pain  Physical Therapy Assessment and Plan PT Assessment and Plan Clinical Impression Statement: Pt pain has decreased since coming to therapy but she is still unable to sit on the floor to play with her nephew as she would like.  Pt still experiencing lumbar pain B.  TReatment wlil be changed to include [place] and manual to attempt to resolve pt pain. Rehab Potential: Good PT Frequency: Min 2X/week PT Duration: 4 weeks PT Plan: Continue to progress lumbar stabilization and LE strength, transfer and general safety. As well as addressing pain    Goals Home Exercise Program Pt/caregiver will Perform Home Exercise Program: For increased ROM;For increased strengthening PT Short  Term Goals PT Short Term Goal 1: Pain level no greater than a 4 80% of the day PT Short Term Goal 1 - Progress: Met PT Short Term Goal 2: Pt to be able to sit on the floor during the day again without pain PT Short Term Goal 2 - Progress: Progressing toward goal PT Long Term Goals PT Long Term Goal 1: I in advance HEP PT Long Term Goal 1 - Progress: Met PT  Long Term Goal 2: Pain no greater than a 2 80% of the time PT Long Term Goal 2 - Progress: Not met  Problem List Patient Active Problem List   Diagnosis Date Noted  . HLD (hyperlipidemia) 09/16/2012  . Vitamin D deficiency 09/16/2012  . Hyperglycemia 09/16/2012  . HTN (hypertension) 09/16/2012  . Odynophagia 10/04/2010  . DIARRHEA 12/04/2009  . 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 ABDOMINAL PAIN 11/21/2009  . MELENA 09/13/2009  . EPIGASTRIC PAIN 09/13/2009  . DENTAL PAIN 07/11/2009  . CEREBRAL PALSY 11/23/2008  . HYPERTENSION 11/23/2008  . GERD 11/23/2008  . HIATAL HERNIA 11/23/2008  . CONSTIPATION 11/23/2008  . DYSPHAGIA UNSPECIFIED 11/23/2008    PT - End of Session Activity Tolerance: Patient tolerated treatment well General Behavior During Therapy: [facility] for tasks assessed/performed  GP Functional Limitation: Self care Self Care Current Status (J1914): At least 40 percent but less than 60 percent impaired, limited or restricted Self Care Goal Status (N8295): At least 20 percent but less than 40 percent impaired, limited or restricted  Terrionna Bridwell,CINDY 01/21/2013, 5:24 PM  Physician Documentation Your signature is required to indicate approval of the treatment plan as stated above.  Please sign and either send electronically or make a copy of this report for your files and return this physician signed original.   Please mark one 1.__approve of plan  2. ___approve of plan with the following conditions.   ______________________________                                                          _____________________ Physician Signature                                                                                                             Date

## 2013-01-25 ENCOUNTER — Encounter: Payer: Self-pay | Admitting: *Deleted

## 2013-01-26 ENCOUNTER — Ambulatory Visit (HOSPITAL_COMMUNITY)
Admission: RE | Admit: 2013-01-26 | Discharge: 2013-01-26 | Disposition: A | Discharge status: Home / Self Care | Payer: No Typology Code available for payment source | Source: Ambulatory Visit | Attending: Family Medicine | Admitting: Family Medicine

## 2013-01-26 NOTE — Progress Notes (Signed)
Physical Therapy Treatment Patient Details  Name: [name] MRN: [id number] Date of Birth: [birth year]  Today's Date: 01/26/2013 Time: 4098-1191 PT Time Calculation (min): 38 min Charges:  Therex x 20' (1500-1520) Ultrasound 8' (1520-1528) Manual x 10 [phone])  Visit#: 10 of 14  Re-eval: 01/21/13  Authorization: [place] [employer] medicare  Authorization Visit#: 10 of 14   Subjective: Symptoms/Limitations Symptoms: Pt has no pain currently. She has most pain in the mornings. Pain Assessment Currently in Pain?: No/denies   Exercise/Treatments Supine Clam: 15 reps Bridge: 15 reps Straight Leg Raise: 15 reps Other Supine Lumbar Exercises: isometric hip ab/adductionx 15 Prone  Straight Leg Raise: 5 reps (with assistance for form) Other Prone Lumbar Exercises: Hamastring curl x 10 bilateral Other Prone Lumbar Exercises: Glute set 10x5"  Modalities Modalities: Ultrasound Manual Therapy Manual Therapy: Myofascial release Myofascial Release: To lumbar musculature to decrease tightness and pain. Ultrasound Ultrasound Location: B lumbar Ultrasound Parameters: 1.2 w/cm2 ; 1mg Hz x 8' Ultrasound Goals: Pain  Physical Therapy Assessment and Plan PT Assessment and Plan Clinical Impression Statement: Pt is pain free this session. Pt reports beneficial results from ultrasound and manual. Began prone exercises to improve glute and hamstring strength. Pt requires max assist to come to prone position. Continued with manual and ultrasound to decrease pain and tightness. Rehab Potential: Good PT Frequency: Min 2X/week PT Duration: 4 weeks PT Plan: Continue to progress lumbar stabilization and LE strength, transfer and general safety as well as addressing pain.     Problem List Patient Active Problem List   Diagnosis Date Noted  . HLD (hyperlipidemia) 09/16/2012  . Vitamin D deficiency 09/16/2012  . Hyperglycemia 09/16/2012  . HTN (hypertension) 09/16/2012  . Odynophagia  10/04/2010  . DIARRHEA 12/04/2009  . 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 ABDOMINAL PAIN 11/21/2009  . MELENA 09/13/2009  . EPIGASTRIC PAIN 09/13/2009  . DENTAL PAIN 07/11/2009  . CEREBRAL PALSY 11/23/2008  . HYPERTENSION 11/23/2008  . GERD 11/23/2008  . HIATAL HERNIA 11/23/2008  . CONSTIPATION 11/23/2008  . DYSPHAGIA UNSPECIFIED 11/23/2008    PT - End of Session Activity Tolerance: Patient tolerated treatment well General Behavior During Therapy: [facility] for tasks assessed/performed  Seth Bake, PTA 01/26/2013, 3:58 PM

## 2013-01-28 ENCOUNTER — Ambulatory Visit (HOSPITAL_COMMUNITY)
Admission: RE | Admit: 2013-01-28 | Discharge: 2013-01-28 | Disposition: A | Discharge status: Home / Self Care | Payer: No Typology Code available for payment source | Source: Ambulatory Visit | Attending: Family Medicine | Admitting: Family Medicine

## 2013-01-28 NOTE — Progress Notes (Signed)
Physical Therapy Treatment Patient Details  Name: [name] MRN: [id number] Date of Birth: 1965/09/21  Today's Date: 01/28/2013 Time: 1525-1610 PT Time Calculation (min): 45 min  Visit#: 11 of 14  Re-eval: 01/21/13 Authorization: [place] [employer] medicare  Authorization Visit#: 11 of 14  Charges:  therex 1525-1550 (25'), manual 1552-1600 (8'), [place] 1601-1609 (8')  Subjective: Symptoms/Limitations Symptoms: Pt states she has no pain currently.  STates her pain is mostly in the morining and this morning it was 4/10.  Pt reports the ultrasound helped alot. Pain Assessment Currently in Pain?: No/denies   Exercise/Treatments Supine Clam: 15 reps Heel Slides: 15 reps Bridge: 15 reps Straight Leg Raise: 15 reps Other Supine Lumbar Exercises: isometric hip ab/adductionx 15 Prone  Straight Leg Raise: 5 reps;Limitations Straight Leg Raises Limitations: AAROM Other Prone Lumbar Exercises: Hamstring curl x 10 bilateral Other Prone Lumbar Exercises: Glute set 10x5"   Modalities Modalities: Ultrasound Manual Therapy Manual Therapy: Myofascial release Myofascial Release: to lumbar musculature to decrease tightness and pain Ultrasound Ultrasound Location: B lumbar Ultrasound Parameters: 1.2 w/cm2 continuous withi X 8 minutes Ultrasound Goals: Pain  Physical Therapy Assessment and Plan PT Assessment and Plan Clinical Impression Statement: Improving symptoms with addition of [place].  Prone extension extremely difficult for patient due to tone/weakness.  Continued difficulty transferring supine <--> prone position. PT Plan: Continue to progress lumbar stabilization and LE strength, transfer and general safety as well as addressing pain.     Problem List Patient Active Problem List   Diagnosis Date Noted  . HLD (hyperlipidemia) 09/16/2012  . Vitamin D deficiency 09/16/2012  . Hyperglycemia 09/16/2012  . HTN (hypertension) 09/16/2012  . Odynophagia 10/04/2010  . DIARRHEA  12/04/2009  . 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 ABDOMINAL PAIN 11/21/2009  . MELENA 09/13/2009  . EPIGASTRIC PAIN 09/13/2009  . DENTAL PAIN 07/11/2009  . CEREBRAL PALSY 11/23/2008  . HYPERTENSION 11/23/2008  . GERD 11/23/2008  . HIATAL HERNIA 11/23/2008  . CONSTIPATION 11/23/2008  . DYSPHAGIA UNSPECIFIED 11/23/2008    PT - End of Session Activity Tolerance: Patient tolerated treatment well General Behavior During Therapy: WFL for tasks assessed/performed   Lurena Nida, PTA/CLT 01/28/2013, 4:31 PM

## 2013-01-30 ENCOUNTER — Other Ambulatory Visit: Payer: Self-pay | Admitting: Family Medicine

## 2013-02-01 NOTE — Telephone Encounter (Signed)
Patient no longer comes to [facility]  Refill denied  Pharmacy needs to send to [facility]

## 2013-02-02 ENCOUNTER — Ambulatory Visit (HOSPITAL_COMMUNITY)
Admission: RE | Admit: 2013-02-02 | Discharge: 2013-02-02 | Disposition: A | Discharge status: Home / Self Care | Payer: No Typology Code available for payment source | Source: Ambulatory Visit | Attending: Family Medicine | Admitting: Family Medicine

## 2013-02-02 NOTE — Progress Notes (Signed)
Physical Therapy Treatment Patient Details  Name: [name] MRN: [id number] Date of Birth: 11-04-1965  Today's Date: 02/02/2013 Time: 4098-1191 PT Time Calculation (min): 40 min  Visit#: 12 of 14  Re-eval: 01/21/13 Authorization: [place] [employer] medicare  Authorization Visit#: 12 of 14  Charges:  therex 30', [place] 8' after therex  Subjective: Symptoms/Limitations Symptoms: Pt states her pain is low, 2/10 Pain Assessment Currently in Pain?: Yes Pain Score: 2  Pain Location: Back   Exercise/Treatments Supine Clam: 20 reps Heel Slides: 20 reps Bridge: 20 reps Straight Leg Raise: 20 reps Sidelying Clam: 10 reps Hip Abduction: 10 reps;Limitations Hip Abduction Limitations: AAROM due to decreased coordination Prone  Straight Leg Raise: 5 reps;Limitations Straight Leg Raises Limitations: AAROM Other Prone Lumbar Exercises: Hamstring curl x 10 bilateral    Modalities Modalities: Ultrasound Ultrasound Ultrasound Location: B lumbar Ultrasound Parameters: 1.2 w/cm2 continuous with X 8 minutes Ultrasound Goals: Pain  Physical Therapy Assessment and Plan PT Assessment and Plan Clinical Impression Statement: Progressed to sidelying exercises to further strengthen hip abductors/ glute med mm.  Pt required AAROM with hip abduction due to decreased coordination.  Pain cointinues to decrease with overall reduction of tightness. PT Plan: Continue to progress lumbar stabilization and LE strength, transfer and general safety as well as addressing pain.     Problem List Patient Active Problem List   Diagnosis Date Noted  . HLD (hyperlipidemia) 09/16/2012  . Vitamin D deficiency 09/16/2012  . Hyperglycemia 09/16/2012  . HTN (hypertension) 09/16/2012  . Odynophagia 10/04/2010  . DIARRHEA 12/04/2009  . 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 ABDOMINAL PAIN 11/21/2009  . MELENA 09/13/2009  . EPIGASTRIC PAIN 09/13/2009  . DENTAL PAIN 07/11/2009  . CEREBRAL PALSY  11/23/2008  . HYPERTENSION 11/23/2008  . GERD 11/23/2008  . HIATAL HERNIA 11/23/2008  . CONSTIPATION 11/23/2008  . DYSPHAGIA UNSPECIFIED 11/23/2008    PT - End of Session Activity Tolerance: Patient tolerated treatment well General Behavior During Therapy: WFL for tasks assessed/performed   Lurena Nida, PTA/CLT 02/02/2013, 3:45 PM

## 2013-02-04 ENCOUNTER — Ambulatory Visit (HOSPITAL_COMMUNITY)
Admission: RE | Admit: 2013-02-04 | Discharge: 2013-02-04 | Disposition: A | Discharge status: Home / Self Care | Payer: No Typology Code available for payment source | Source: Ambulatory Visit | Attending: Family Medicine | Admitting: Family Medicine

## 2013-02-04 NOTE — Progress Notes (Signed)
Physical Therapy Treatment Patient Details  Name: CHARRISSE MASLEY MRN: [id number] Date of Birth: 1965/10/15  Today's Date: 02/04/2013 Time: 1525-1600 PT Time Calculation (min): 35 min  Visit#: 13 of 13  Re-eval:      Authorization: [employer]  Authorization Time Period:    Authorization Visit#: 13 of 13   Subjective: Symptoms/Limitations Symptoms: Pt states that she is 80% better now and is on the floor playing with her niece again.  Pt had not been able to get onto the floor secondary to pain. Pain Assessment Currently in Pain?: Yes Pain Score: 2 was as high as an 8/10 Pain Location: Back Pain Orientation: Right;Left Pain Relieving Factors: intermittently taking mm relaxors.  Was taking on a regular basis  Exercise/Treatments     Stretches Passive Hamstring Stretch: 3 reps;30 seconds Single Knee to Chest Stretch: 3 reps;30 seconds Single Knee to Chest Stretch Limitations: PROM sciatic nerve glides Lower Trunk Rotation: 10 seconds;Limitations Lower Trunk Rotation Limitations: 10 reps each   Supine Ab Set: 15 reps Clam: 15 reps Heel Slides: 10 reps Bridge: 15 reps Other Supine Lumbar Exercises: isometric hip ab/adductionx 15 Modalities Modalities: Ultrasound Ultrasound Ultrasound Location: B Lumbar Ultrasound Parameters: 1.5 w/cm2  1 Mhz x 8 minutes.  Physical Therapy Assessment and Plan PT Assessment and Plan Clinical Impression Statement: Pt doing well and has met all goals PT Plan: discharge    Goals Home Exercise Program PT Goal: Perform Home Exercise Program - Progress: Met PT Short Term Goals Time to Complete Short Term Goals: 2 weeks PT Short Term Goal 1: Pain level no greater than a 4 80% of the day PT Short Term Goal 1 - Progress: Met PT Short Term Goal 2: Pt to be able to sit on the floor during the day again without pain PT Short Term Goal 2 - Progress: Met PT Long Term Goals PT Long Term Goal 1: I in advance HEP PT Long Term Goal 1  - Progress: Met PT Long Term Goal 2: Pain no greater than a 2 80% of the time PT Long Term Goal 2 - Progress: Met  Problem List Patient Active Problem List   Diagnosis Date Noted  . HLD (hyperlipidemia) 09/16/2012  . Vitamin D deficiency 09/16/2012  . Hyperglycemia 09/16/2012  . HTN (hypertension) 09/16/2012  . Odynophagia 10/04/2010  . DIARRHEA 12/04/2009  . 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 ABDOMINAL PAIN 11/21/2009  . MELENA 09/13/2009  . EPIGASTRIC PAIN 09/13/2009  . DENTAL PAIN 07/11/2009  . CEREBRAL PALSY 11/23/2008  . HYPERTENSION 11/23/2008  . GERD 11/23/2008  . HIATAL HERNIA 11/23/2008  . CONSTIPATION 11/23/2008  . DYSPHAGIA UNSPECIFIED 11/23/2008    PT - End of Session Activity Tolerance: Patient tolerated treatment well General Behavior During Therapy: [facility] for tasks assessed/performed  GP Functional Limitation: Self care Self Care Goal Status (H0865): At least 20 percent but less than 40 percent impaired, limited or restricted Self Care Discharge Status [phone]): At least 1 percent but less than 20 percent impaired, limited or restricted  RUSSELL,CINDY 02/04/2013, 4:46 PM

## 2013-02-09 ENCOUNTER — Inpatient Hospital Stay (HOSPITAL_COMMUNITY)
Admission: RE | Admit: 2013-02-09 | Payer: PRIVATE HEALTH INSURANCE | Source: Ambulatory Visit | Admitting: Physical Therapy

## 2013-04-05 ENCOUNTER — Other Ambulatory Visit: Payer: Self-pay

## 2013-04-05 MED ORDER — METOCLOPRAMIDE HCL 5 MG PO TABS: 5.0000 mg | ORAL_TABLET | Freq: Two times a day (BID) | ORAL | Status: DC

## 2013-04-05 MED ORDER — DICYCLOMINE HCL 10 MG PO CAPS: 10.0000 mg | ORAL_CAPSULE | Freq: Three times a day (TID) | ORAL | Status: DC

## 2013-04-07 ENCOUNTER — Ambulatory Visit: Payer: PRIVATE HEALTH INSURANCE | Admitting: Family Medicine

## 2013-04-12 ENCOUNTER — Ambulatory Visit (INDEPENDENT_AMBULATORY_CARE_PROVIDER_SITE_OTHER): Payer: PRIVATE HEALTH INSURANCE | Admitting: Family Medicine

## 2013-04-12 ENCOUNTER — Encounter: Payer: Self-pay | Admitting: Family Medicine

## 2013-04-12 VITALS — BP 162/84 | HR 83 | Temp 97.5°F | Ht 62.0 in

## 2013-04-12 DIAGNOSIS — K449 Diaphragmatic hernia without obstruction or gangrene: Secondary | ICD-10-CM

## 2013-04-12 DIAGNOSIS — K219 Gastro-esophageal reflux disease without esophagitis: Secondary | ICD-10-CM

## 2013-04-12 DIAGNOSIS — D126 Benign neoplasm of colon, unspecified: Secondary | ICD-10-CM

## 2013-04-12 DIAGNOSIS — M79609 Pain in unspecified limb: Secondary | ICD-10-CM | POA: Insufficient documentation

## 2013-04-12 DIAGNOSIS — E785 Hyperlipidemia, unspecified: Secondary | ICD-10-CM

## 2013-04-12 DIAGNOSIS — I10 Essential (primary) hypertension: Secondary | ICD-10-CM

## 2013-04-12 DIAGNOSIS — Z23 Encounter for immunization: Secondary | ICD-10-CM | POA: Insufficient documentation

## 2013-04-12 DIAGNOSIS — R1310 Dysphagia, unspecified: Secondary | ICD-10-CM

## 2013-04-12 DIAGNOSIS — R7309 Other abnormal glucose: Secondary | ICD-10-CM

## 2013-04-12 DIAGNOSIS — R739 Hyperglycemia, unspecified: Secondary | ICD-10-CM

## 2013-04-12 DIAGNOSIS — E559 Vitamin D deficiency, unspecified: Secondary | ICD-10-CM

## 2013-04-12 DIAGNOSIS — G809 Cerebral palsy, unspecified: Secondary | ICD-10-CM

## 2013-04-12 DIAGNOSIS — K5900 Constipation, unspecified: Secondary | ICD-10-CM

## 2013-04-12 DIAGNOSIS — B079 Viral wart, unspecified: Secondary | ICD-10-CM | POA: Insufficient documentation

## 2013-04-12 LAB — POCT GLYCOSYLATED HEMOGLOBIN (HGB A1C): Hemoglobin A1C: 5.8

## 2013-04-12 MED ORDER — RANITIDINE HCL 300 MG PO TABS: 300.0000 mg | ORAL_TABLET | Freq: Every day | ORAL | Status: DC

## 2013-04-12 NOTE — Patient Instructions (Signed)
Gastroesophageal Reflux Disease, Adult  Gastroesophageal reflux disease (GERD) happens when acid from your stomach flows up into the esophagus. When acid comes in contact with the esophagus, the acid causes soreness (inflammation) in the esophagus. Over time, GERD may create small holes (ulcers) in the lining of the esophagus.  CAUSES   · Increased body weight. This puts pressure on the stomach, making acid rise from the stomach into the esophagus.  · Smoking. This increases acid production in the stomach.  · Drinking alcohol. This causes decreased pressure in the lower esophageal sphincter (valve or ring of muscle between the esophagus and stomach), allowing acid from the stomach into the esophagus.  · Late evening meals and a full stomach. This increases pressure and acid production in the stomach.  · A malformed lower esophageal sphincter.  Sometimes, no cause is found.  SYMPTOMS   · Burning pain in the lower part of the mid-chest behind the breastbone and in the mid-stomach area. This may occur twice a week or more often.  · Trouble swallowing.  · Sore throat.  · Dry cough.  · Asthma-like symptoms including chest tightness, shortness of breath, or wheezing.  DIAGNOSIS   Your caregiver may be able to diagnose GERD based on your symptoms. In some cases, X-rays and other tests may be done to check for complications or to check the condition of your stomach and esophagus.  TREATMENT   Your caregiver may recommend over-the-counter or prescription medicines to help decrease acid production. Ask your caregiver before starting or adding any new medicines.   HOME CARE INSTRUCTIONS   · Change the factors that you can control. Ask your caregiver for guidance concerning weight loss, quitting smoking, and alcohol consumption.  · Avoid foods and drinks that make your symptoms worse, such as:  · Caffeine or alcoholic drinks.  · Chocolate.  · Peppermint or mint flavorings.  · Garlic and onions.  · Spicy foods.  · Citrus fruits,  such as oranges, lemons, or limes.  · Tomato-based foods such as sauce, chili, salsa, and pizza.  · Fried and fatty foods.  · Avoid lying down for the 3 hours prior to your bedtime or prior to taking a nap.  · Eat small, frequent meals instead of large meals.  · Wear loose-fitting clothing. Do not wear anything tight around your waist that causes pressure on your stomach.  · Raise the head of your bed 6 to 8 inches with wood blocks to help you sleep. Extra pillows will not help.  · Only take over-the-counter or prescription medicines for pain, discomfort, or fever as directed by your caregiver.  · Do not take aspirin, ibuprofen, or other nonsteroidal anti-inflammatory drugs (NSAIDs).  SEEK IMMEDIATE MEDICAL CARE IF:   · You have pain in your arms, neck, jaw, teeth, or back.  · Your pain increases or changes in intensity or duration.  · You develop nausea, vomiting, or sweating (diaphoresis).  · You develop shortness of breath, or you faint.  · Your vomit is green, yellow, black, or looks like coffee grounds or blood.  · Your stool is red, bloody, or black.  These symptoms could be signs of other problems, such as heart disease, gastric bleeding, or esophageal bleeding.  MAKE SURE YOU:   · Understand these instructions.  · Will watch your condition.  · Will get help right away if you are not doing well or get worse.  Document Released: 02/06/2005 Document Revised: 07/22/2011 Document Reviewed: 11/16/2010  [employer]® Patient   Information ©2014 [employer].
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
## 2013-04-13 LAB — LIPID PANEL
Chol/HDL Ratio: 2.3 ratio units (ref 0.0–4.4)
Cholesterol, Total: 176 mg/dL (ref 100–199)
HDL: 75 mg/dL (ref >39)
LDL Calculated: 90 mg/dL (ref 0–99)
Triglycerides: 55 mg/dL (ref 0–149)
VLDL Cholesterol Cal: 11 mg/dL (ref 5–40)

## 2013-04-13 LAB — CMP14+EGFR
ALT: 19 IU/L (ref 0–32)
AST: 17 IU/L (ref 0–40)
Albumin/Globulin Ratio: 1.7 (ref 1.1–2.5)
Albumin: 4.6 g/dL (ref 3.5–5.5)
Alkaline Phosphatase: 70 IU/L (ref 39–117)
BUN/Creatinine Ratio: 22 (ref 9–23)
BUN: 15 mg/dL (ref 6–24)
CO2: 28 mmol/L (ref 18–29)
Calcium: 9.5 mg/dL (ref 8.7–10.2)
Chloride: 101 mmol/L (ref 97–108)
Creatinine, Ser: 0.69 mg/dL (ref 0.57–1.00)
GFR calc Af Amer: 118 mL/min/{1.73_m2} (ref >59)
GFR calc non Af Amer: 103 mL/min/{1.73_m2} (ref >59)
Globulin, Total: 2.7 g/dL (ref 1.5–4.5)
Glucose: 90 mg/dL (ref 65–99)
Potassium: 4.3 mmol/L (ref 3.5–5.2)
Sodium: 142 mmol/L (ref 134–144)
Total Bilirubin: 0.2 mg/dL (ref 0.0–1.2)
Total Protein: 7.3 g/dL (ref 6.0–8.5)

## 2013-04-15 ENCOUNTER — Telehealth: Payer: Self-pay | Admitting: Gastroenterology

## 2013-04-15 ENCOUNTER — Encounter (INDEPENDENT_AMBULATORY_CARE_PROVIDER_SITE_OTHER): Payer: PRIVATE HEALTH INSURANCE | Admitting: Gastroenterology
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
## 2013-04-15 NOTE — Progress Notes (Deleted)
   Subjective:    Patient ID: [name], female    DOB: 06-27-1965, 47 y.o.   MRN: [id number]  HPI    Past Medical History  Diagnosis Date  . GERD (gastroesophageal reflux disease) 2007    BPE NL ESO, REFLUX  . Dysphagia 2003    2O to GERD/HH, ?difficulty with large food bolus due to neuromuscular weakness  . Constipation - functional   . Hiatal hernia   . HTN (hypertension)   . Cerebral palsy   . Epigastric pain MAY 2012 ABD [place] NL GBLIVPAN    2o to GERD V. NON-ULCER DYSPEPSIA  . Hyperlipidemia       Review of Systems     Objective:   Physical Exam        Assessment & Plan:

## 2013-04-15 NOTE — Telephone Encounter (Signed)
REVIEWED.  

## 2013-04-19 NOTE — Progress Notes (Signed)
Error

## 2013-04-26 ENCOUNTER — Telehealth: Payer: Self-pay | Admitting: *Deleted

## 2013-04-26 NOTE — Telephone Encounter (Signed)
Message copied by Baltazar Apo on Mon Apr 26, 2013 11:19 AM ------      Message from: Ileana Ladd      Created: Fri Apr 16, 2013 11:24 AM       Call Patient: pre diabetic.      Lab results are all at goal.      No change in Medications for now.      No Change in recommendations and plans for follow up.      Watch sugars sweets and carbs. More vegetables. ;less dairy/cheese in diet.       ------

## 2013-05-20 ENCOUNTER — Encounter: Payer: Self-pay | Admitting: Gastroenterology

## 2013-05-20 ENCOUNTER — Encounter (INDEPENDENT_AMBULATORY_CARE_PROVIDER_SITE_OTHER): Payer: Self-pay

## 2013-05-20 ENCOUNTER — Ambulatory Visit (INDEPENDENT_AMBULATORY_CARE_PROVIDER_SITE_OTHER): Payer: PRIVATE HEALTH INSURANCE | Admitting: Gastroenterology

## 2013-05-20 VITALS — BP 140/90 | HR 82 | Temp 97.4°F | Wt 125.0 lb

## 2013-05-20 DIAGNOSIS — R1310 Dysphagia, unspecified: Secondary | ICD-10-CM

## 2013-05-20 DIAGNOSIS — K219 Gastro-esophageal reflux disease without esophagitis: Secondary | ICD-10-CM

## 2013-05-20 DIAGNOSIS — K449 Diaphragmatic hernia without obstruction or gangrene: Secondary | ICD-10-CM

## 2013-05-20 MED ORDER — PANTOPRAZOLE SODIUM 40 MG PO TBEC: DELAYED_RELEASE_TABLET | ORAL | Status: DC

## 2013-05-20 MED ORDER — RANITIDINE HCL 300 MG PO TABS: ORAL_TABLET | ORAL | Status: DC

## 2013-05-20 NOTE — Progress Notes (Signed)
Reminder in epic °

## 2013-05-20 NOTE — Assessment & Plan Note (Addendum)
NOT WELL CONTROLLED OFF PPI.  CHANGE ZANTAC TO AS NEEDED. TAKE EVERY OTHER DAY IF NEEDED. RESTART PROTONIX. TAKE 30 MINUTES PRIOR TO MEALS TWICE DAILY.  CALL IN 1 MO IF SYMPTOMS ARE NOT IMPROVED.  FOLLOW UP IN 2 MOS. CONSIDER BRAVO STUDY. EXPLAINED PROCEDURE TO PT AND SISTER AND INDICATIONS.
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
## 2013-05-20 NOTE — Assessment & Plan Note (Signed)
RESOLVED  CONTINUE TO MONITOR SYMPTOMS. 

## 2013-05-20 NOTE — Progress Notes (Signed)
cc'd to pcp 

## 2013-05-20 NOTE — Patient Instructions (Signed)
CHANGE ZANTAC TO AS NEEDED. TAKE EVERY OTHER DAY IF NEEDED.  RESTART PROTONIX. TAKE 30 MINUTES PRIOR TO MEALS TWICE DAILY.  CALL IN 1 MO IF SYMPTOMS ARE NOT IMPROVED.   FOLLOW UP IN 2 MOS.

## 2013-07-13 ENCOUNTER — Ambulatory Visit: Payer: PRIVATE HEALTH INSURANCE | Admitting: Family Medicine

## 2013-07-31 ENCOUNTER — Other Ambulatory Visit: Payer: Self-pay | Admitting: Gastroenterology

## 2013-08-23 ENCOUNTER — Telehealth: Payer: Self-pay
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
## 2013-09-03 ENCOUNTER — Ambulatory Visit (INDEPENDENT_AMBULATORY_CARE_PROVIDER_SITE_OTHER): Payer: PRIVATE HEALTH INSURANCE | Admitting: Family Medicine

## 2013-09-03 ENCOUNTER — Encounter: Payer: Self-pay | Admitting: Family Medicine

## 2013-09-03 VITALS — BP 145/80 | HR 69 | Temp 98.1°F

## 2013-09-03 DIAGNOSIS — Z23 Encounter for immunization: Secondary | ICD-10-CM | POA: Insufficient documentation

## 2013-09-03 DIAGNOSIS — I10 Essential (primary) hypertension: Secondary | ICD-10-CM

## 2013-09-03 DIAGNOSIS — K449 Diaphragmatic hernia without obstruction or gangrene: Secondary | ICD-10-CM

## 2013-09-03 DIAGNOSIS — R7309 Other abnormal glucose: Secondary | ICD-10-CM

## 2013-09-03 DIAGNOSIS — R739 Hyperglycemia, unspecified: Secondary | ICD-10-CM

## 2013-09-03 DIAGNOSIS — G809 Cerebral palsy, unspecified: Secondary | ICD-10-CM

## 2013-09-03 DIAGNOSIS — R350 Frequency of micturition: Secondary | ICD-10-CM | POA: Insufficient documentation

## 2013-09-03 DIAGNOSIS — D126 Benign neoplasm of colon, unspecified: Secondary | ICD-10-CM

## 2013-09-03 DIAGNOSIS — E559 Vitamin D deficiency, unspecified: Secondary | ICD-10-CM

## 2013-09-03 DIAGNOSIS — E785 Hyperlipidemia, unspecified: Secondary | ICD-10-CM

## 2013-09-03 LAB — POCT URINALYSIS DIPSTICK
Bilirubin, UA: NEGATIVE
Glucose, UA: NEGATIVE
Ketones, UA: NEGATIVE
Nitrite, UA: NEGATIVE
Protein, UA: NEGATIVE
Spec Grav, UA: 1.02
Urobilinogen, UA: NEGATIVE
pH, UA: 6.5

## 2013-09-03 LAB — POCT UA - MICROSCOPIC ONLY
Bacteria, U Microscopic: NEGATIVE
Casts, Ur, LPF, POC: NEGATIVE
Crystals, Ur, HPF, POC: NEGATIVE
Mucus, UA: NEGATIVE
Yeast, UA: NEGATIVE

## 2013-09-03 LAB — POCT GLYCOSYLATED HEMOGLOBIN (HGB A1C): Hemoglobin A1C: 5.8

## 2013-09-03 NOTE — Telephone Encounter (Signed)
PT SEEN TODAY BY DR Jacelyn Grip AND THIS WAS ADDRESSED

## 2013-09-03 NOTE — Progress Notes (Signed)
Tolerated tdap injection without difficulty

## 2013-09-03 NOTE — Patient Instructions (Signed)
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
## 2013-09-04 LAB — LIPID PANEL
Chol/HDL Ratio: 2.7 ratio units (ref 0.0–4.4)
Cholesterol, Total: 187 mg/dL (ref 100–199)
HDL: 70 mg/dL (ref >39)
LDL Calculated: 100 mg/dL — ABNORMAL HIGH (ref 0–99)
Triglycerides: 83 mg/dL (ref 0–149)
VLDL Cholesterol Cal: 17 mg/dL (ref 5–40)

## 2013-09-04 LAB — CMP14+EGFR
ALT: 17 IU/L (ref 0–32)
AST: 18 IU/L (ref 0–40)
Albumin/Globulin Ratio: 1.5 (ref 1.1–2.5)
Albumin: 4.6 g/dL (ref 3.5–5.5)
Alkaline Phosphatase: 82 IU/L (ref 39–117)
BUN/Creatinine Ratio: 20 (ref 9–23)
BUN: 12 mg/dL (ref 6–24)
CO2: 25 mmol/L (ref 18–29)
Calcium: 9.4 mg/dL (ref 8.7–10.2)
Chloride: 101 mmol/L (ref 97–108)
Creatinine, Ser: 0.61 mg/dL (ref 0.57–1.00)
GFR calc Af Amer: 122 mL/min/{1.73_m2} (ref >59)
GFR calc non Af Amer: 106 mL/min/{1.73_m2} (ref >59)
Globulin, Total: 3 g/dL (ref 1.5–4.5)
Glucose: 93 mg/dL (ref 65–99)
Potassium: 4.4 mmol/L (ref 3.5–5.2)
Sodium: 140 mmol/L (ref 134–144)
Total Bilirubin: 0.2 mg/dL (ref 0.0–1.2)
Total Protein: 7.6 g/dL (ref 6.0–8.5)

## 2013-09-05 LAB — URINE CULTURE

## 2013-09-08 ENCOUNTER — Telehealth: Payer: Self-pay | Admitting: Family Medicine

## 2013-09-08 NOTE — Telephone Encounter (Signed)
Message copied by Waverly Ferrari on Wed Sep 08, 2013  9:44 AM ------      Message from: Vernie Shanks      Created: Sun Sep 05, 2013  4:07 PM       Labs results are stable.      No changes in medications.      No change in recommendations.      No change in Follow up ------

## 2013-09-10 ENCOUNTER — Other Ambulatory Visit: Payer: Self-pay | Admitting: Family Medicine

## 2013-09-10 ENCOUNTER — Telehealth: Payer: Self-pay | Admitting: Family Medicine

## 2013-09-10 DIAGNOSIS — E785 Hyperlipidemia, unspecified: Secondary | ICD-10-CM

## 2013-09-10 DIAGNOSIS — K219 Gastro-esophageal reflux disease without esophagitis: Secondary | ICD-10-CM

## 2013-09-10 DIAGNOSIS — K449 Diaphragmatic hernia without obstruction or gangrene: Secondary | ICD-10-CM

## 2013-09-10 MED ORDER — PANTOPRAZOLE SODIUM 40 MG PO TBEC: DELAYED_RELEASE_TABLET | ORAL | Status: DC

## 2013-09-10 MED ORDER — LISINOPRIL 2.5 MG PO TABS: 2.5000 mg | ORAL_TABLET | Freq: Every day | ORAL | Status: DC

## 2013-09-10 MED ORDER — RANITIDINE HCL 300 MG PO TABS: ORAL_TABLET | ORAL | Status: DC

## 2013-09-10 MED ORDER — ACETAMINOPHEN-CODEINE #3 300-30 MG PO TABS: 1.0000 | ORAL_TABLET | ORAL | Status: DC | PRN

## 2013-09-10 MED ORDER — PRAVASTATIN SODIUM 40 MG PO TABS: 40.0000 mg | ORAL_TABLET | Freq: Every day | ORAL | Status: DC

## 2013-09-10 NOTE — Telephone Encounter (Signed)
Patient aware.

## 2013-09-10 NOTE — Telephone Encounter (Signed)
Rx ready for pick up. 

## 2013-10-14 ENCOUNTER — Ambulatory Visit (HOSPITAL_COMMUNITY)
Admission: RE | Admit: 2013-10-14 | Discharge: 2013-10-14 | Disposition: A | Discharge status: Home / Self Care | Payer: PRIVATE HEALTH INSURANCE | Source: Ambulatory Visit | Attending: Family Medicine | Admitting: Family Medicine

## 2013-10-14 DIAGNOSIS — IMO0001 Reserved for inherently not codable concepts without codable children: Secondary | ICD-10-CM | POA: Insufficient documentation

## 2013-10-14 DIAGNOSIS — M6281 Muscle weakness (generalized): Secondary | ICD-10-CM | POA: Insufficient documentation

## 2013-10-14 DIAGNOSIS — R269 Unspecified abnormalities of gait and mobility: Secondary | ICD-10-CM | POA: Insufficient documentation

## 2013-10-14 DIAGNOSIS — G809 Cerebral palsy, unspecified: Secondary | ICD-10-CM | POA: Insufficient documentation

## 2013-10-14 DIAGNOSIS — E785 Hyperlipidemia, unspecified: Secondary | ICD-10-CM | POA: Insufficient documentation

## 2013-10-14 DIAGNOSIS — M62838 Other muscle spasm: Secondary | ICD-10-CM | POA: Insufficient documentation

## 2013-10-14 DIAGNOSIS — I10 Essential (primary) hypertension: Secondary | ICD-10-CM | POA: Insufficient documentation

## 2013-10-14 NOTE — Evaluation (Signed)
Physical Therapy Evaluation  Patient Details  Name: [name] MRN: [id number] Date of Birth: 09/19/1965  Today's Date: 10/14/2013 Time: 3532-9924 PT Time Calculation (min): 45 min     1 Evaluation         Visit#: 1 of 1  Re-eval:   Assessment Diagnosis: Infantile cerebral palsy Next MD Visit: Dr. Jacelyn Grip, 3 months Prior Therapy: no  Authorization: Medicaid, medicare     Authorization Time Period:    Authorization Visit#: 1 of 1   Past Medical History:  Past Medical History  Diagnosis Date  . GERD (gastroesophageal reflux disease) 2007    BPE NL ESO, REFLUX  . Dysphagia 2003    2O to GERD/HH, ?difficulty with large food bolus due to neuromuscular weakness  . Constipation - functional   . Hiatal hernia   . HTN (hypertension)   . Cerebral palsy   . Epigastric pain MAY 2012 ABD [place] NL GBLIVPAN    2o to GERD V. NON-ULCER DYSPEPSIA  . Hyperlipidemia    Past Surgical History:  Past Surgical History  Procedure Laterality Date  . Colonoscopy  APR 2009 with proprofol    SLF: SIMPLE ADENOMAS  . Upper gastrointestinal endoscopy  MAY 2012 DIL 16 mm    SLF: NO DEFINITE STRICTURE APPRECIATED  . Upper gastrointestinal endoscopy  FEB 2009-HYPOXIA REQUIRING NARCAN, & VERSED, D50V4    SLF: NL ESO, FUNDIC GLAND POLYPS  . Upper gastrointestinal endoscopy  w/ DIL 2003, 2004, 2006    Subjective Symptoms/Limitations Symptoms: No pain, patient has been unable to ambulate since birth Pertinent History: Cerebral palsy  Limitations: Standing;Walking How long can you sit comfortably?: no difficulty How long can you stand comfortably?: unable to without max assistance How long can you walk comfortably?: unable to Pain Assessment Currently in Pain?: No/denies  Precautions/Restrictions  Precautions Precautions: Fall  Balance Screening    Prior Functioning  Home Living Additional Comments: Home Prior Function Level of Independence: Needs assistance with ADLs;Needs assistance with  gait;Needs assistance with tranfers  Able to Take Stairs?: No Vocation: Part time employment Vocation Requirements: sitting and shredding papers.   Cognition/Observation Observation/Other Assessments Observations: Patient is able to sit with good posture.   Assessment RLE Strength Right Hip Flexion: 4/5 Right Hip Extension: 3+/5 Right Hip ABduction: 3+/5 Right Hip ADduction: 3+/5 Right Knee Flexion: 3+/5 Right Knee Extension: 3+/5 Right Ankle Dorsiflexion: 2/5 LLE Strength Left Hip Flexion: 4/5 Left Hip Extension: 3+/5 Left Hip ABduction: 3+/5 Left Hip ADduction: 3+/5 Left Knee Flexion: 3+/5 Left Knee Extension: 3+/5 Left Ankle Dorsiflexion: 2+/5  Exercise/Treatments Mobility/Balance  Transfers Transfers: Sit to Stand Sit to Stand: 2: Max assist   Physical Therapy Assessment and Plan PT Assessment and Plan Clinical Impression Statement: Patient displays significant bilateral LE weakness and bilateral LE spasticity and UE spasticity resulting in patient's inability to stand independently as paient requires maximum assitance to obtain standing, Moderate assistance to maintain standing, and maximum assitance x 2 to ambulate on even surfaces. Stairs were attemptent but patient was unable to complete due to limited strength and patient requiring 2 person max assit to complete. This patient  must have a ramp at home  to get in and out of her home as she currently must have someone push the wheel chair and does not have enough assistance at home to safely navigate stairs. A ramp would also decrease the burden of care on her care takers and increase their safety to reduce the risk of injury while assiting her into and  out of her home. Without a ramp the patient will be unable to maintain her participation in community activities and get the neccessary exercise to maintain her current function.  Clinical Impairments Affecting Rehab Potential: infintile cerbral palsy  PT Plan: A ramp for  thee patient to get in and out of her home is medically necessary.     Goals    Problem List Patient Active Problem List   Diagnosis Date Noted  . Frequency of micturition 09/03/2013  . Need for Tdap vaccination 09/03/2013  . Pain in limb 04/12/2013  . Need for prophylactic vaccination and inoculation against influenza 04/12/2013  . Wart viral 04/12/2013  . HLD (hyperlipidemia) 09/16/2012  . Vitamin D deficiency 09/16/2012  . Hyperglycemia 09/16/2012  . HTN (hypertension) 09/16/2012  . Odynophagia 10/04/2010  . DIARRHEA 12/04/2009  . 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 ABDOMINAL PAIN 11/21/2009  . MELENA 09/13/2009  . EPIGASTRIC PAIN 09/13/2009  . DENTAL PAIN 07/11/2009  . CEREBRAL PALSY 11/23/2008  . HYPERTENSION 11/23/2008  . GERD 11/23/2008  . HIATAL HERNIA 11/23/2008  . CONSTIPATION 11/23/2008  . DYSPHAGIA UNSPECIFIED 11/23/2008    PT - End of Session Equipment Utilized During Treatment: Gait belt Activity Tolerance: Patient tolerated treatment well General Behavior During Therapy: WFL for tasks assessed/performed  GP Functional Assessment Tool Used: Clinical judgement Functional Limitation: Mobility: Walking and moving around Mobility: Walking and Moving Around Current Status (G6440): At least 60 percent but less than 80 percent impaired, limited or restricted Mobility: Walking and Moving Around Goal Status [phone]): At least 60 percent but less than 80 percent impaired, limited or restricted Mobility: Walking and Moving Around Discharge Status [phone]): At least 60 percent but less than 80 percent impaired, limited or restricted  Eyan Hagood R Jacoby Ritsema 10/14/2013, 4:40 PM  Physician Documentation Your signature is required to indicate approval of the treatment plan as stated above.  Please sign and either send electronically or make a copy of this report for your files and return this physician signed original.   Please mark one 1.__approve of plan  2. ___approve of  plan with the following conditions.   ______________________________                                                          _____________________ Physician Signature                                                                                                             Date

## 2013-12-02 ENCOUNTER — Encounter (INDEPENDENT_AMBULATORY_CARE_PROVIDER_SITE_OTHER): Payer: Self-pay

## 2013-12-02 ENCOUNTER — Other Ambulatory Visit: Payer: Self-pay | Admitting: Gastroenterology

## 2013-12-02 ENCOUNTER — Ambulatory Visit (INDEPENDENT_AMBULATORY_CARE_PROVIDER_SITE_OTHER): Payer: PRIVATE HEALTH INSURANCE | Admitting: Gastroenterology

## 2013-12-02 ENCOUNTER — Encounter: Payer: Self-pay | Admitting: Gastroenterology

## 2013-12-02 VITALS — BP 142/80 | HR 88

## 2013-12-02 DIAGNOSIS — D126 Benign neoplasm of colon, unspecified: Secondary | ICD-10-CM

## 2013-12-02 DIAGNOSIS — Z1211 Encounter for screening for malignant neoplasm of colon: Secondary | ICD-10-CM

## 2013-12-02 DIAGNOSIS — R1319 Other dysphagia: Secondary | ICD-10-CM

## 2013-12-02 DIAGNOSIS — K219 Gastro-esophageal reflux disease without esophagitis: Secondary | ICD-10-CM

## 2013-12-02 DIAGNOSIS — R1310 Dysphagia, unspecified: Secondary | ICD-10-CM

## 2013-12-02 MED ORDER — PEG 3350-KCL-NA BICARB-NACL 420 G PO SOLR: 4000.0000 mL | Freq: Once | ORAL | Status: DC

## 2013-12-02 NOTE — Patient Instructions (Addendum)
FOLLOW A SOFT MECHANICAL DIET. SEE INFO BELOW.  START A FULL LIQUID DIET JUL 29.   HOLD BENTYL  AND TAKE PREPOPIK JUL 29.  UPPER AND LOWER ENDOSCOPY JUL 30.  FOLLOW UP IN 4 MOS.  Full Liquid Diet A high-calorie, high-protein supplement should be used to meet your nutritional requirements when the full liquid diet is continued for more than 2 or 3 days. If this diet is to be used for an extended period of time (more than 7 days), a multivitamin should be considered.  Breads and Starches  Allowed: None are allowed except crackers WHOLE OR pureed (made into a thick, smooth soup) in soup. Cooked, refined corn, oat, rice, rye, and wheat cereals are also allowed.   Avoid: Any others.    Potatoes/Pasta/Rice  Allowed: ANY ITEM AS A SOUP OR SMALL PLATE OF MASHED POTATOES OR RICE.       Vegetables  Allowed: Strained tomato or vegetable juice. Vegetables pureed in soup.   Avoid: Any others.    Fruit  Allowed: Any strained fruit juices and fruit drinks. Include 1 serving of citrus or vitamin C-enriched fruit juice daily.   Avoid: Any others.  Meat and Meat Substitutes  Allowed: Egg  Avoid: Any meat, fish, or fowl. All cheese.  Milk  Allowed: Milk beverages, including milk shakes and instant breakfast mixes. Smooth yogurt.   Avoid: Any others. Avoid dairy products if not tolerated.    Soups and Combination Foods  Allowed: Broth, strained cream soups. Strained, broth-based soups.   Avoid: Any others.    Desserts and Sweets  Allowed: flavored gelatin, tapioca, plain ice cream, sherbet, smooth pudding, junket, fruit ices, frozen ice pops, pudding pops,, frozen fudge pops, chocolate syrup. Sugar, honey, jelly, syrup.   Avoid: Any others.  Fats and Oils  Allowed: Margarine, butter, cream, sour cream, oils.   Avoid: Any others.  Beverages  Allowed: All.   Avoid: None.  Condiments  Allowed: Iodized salt, pepper, spices, flavorings. Cocoa powder.   Avoid: Any  others.    SAMPLE MEAL PLAN Breakfast   cup orange juice.   1 cup cooked wheat cereal.   1 cup  milk.   1 cup beverage (coffee or tea).   Cream or sugar, if desired.    Midmorning Snack  2 SCRAMBLED OR HARD BOILED EGG   Lunch  1 cup cream soup.    cup fruit juice.   1 cup milk.    cup custard.   1 cup beverage (coffee or tea).   Cream or sugar, if desired.    Midafternoon Snack  1 cup milk shake.  Dinner  1 cup cream soup.    cup fruit juice.   1 cup milk.    cup pudding.   1 cup beverage (coffee or tea).   Cream or sugar, if desired.  Evening Snack  1 cup supplement.  To increase calories, add sugar, cream, butter, or margarine if possible. Nutritional supplements will also increase the total calories.

## 2013-12-02 NOTE — Progress Notes (Signed)
Subjective:    Patient ID: [name], female    DOB: 08-Jun-1965, 48 y.o.   MRN: [id number] Anthoney Harada, MD   HPI TROUBLE SWALLOWING SOLIDS AND LIQUIDS. DOESN'T INDUCE VOMITING BURNING SENSATION IN CHEST-OFF AND ON, EVERY DAY. BMS; EVERY DAY.-NL. BELLY HURTS OFF AND ON SOMETIME. SOB SOMETIMES.   PT DENIES FEVER, CHILLS, HEMATOCHEZIA, nausea, vomiting, melena, diarrhea, OR constipation.  REVIEWED EMR FROM 2009 TO PRESENT.  Past Medical History  Diagnosis Date  . GERD (gastroesophageal reflux disease) 2007    BPE NL ESO, REFLUX  . Dysphagia 2003    2O to GERD/HH, ?difficulty with large food bolus due to neuromuscular weakness  . Constipation - functional   . Hiatal hernia   . HTN (hypertension)   . Cerebral palsy   . Epigastric pain MAY 2012 ABD [place] NL GBLIVPAN    2o to GERD V. NON-ULCER DYSPEPSIA  . Hyperlipidemia    Past Surgical History  Procedure Laterality Date  . Colonoscopy  APR 2009 with proprofol    SLF: SIMPLE ADENOMAS  . Upper gastrointestinal endoscopy  MAY 2012 DIL 16 mm    SLF: NO DEFINITE STRICTURE APPRECIATED  . Upper gastrointestinal endoscopy  FEB 2009-HYPOXIA REQUIRING NARCAN, & VERSED, D50V4    SLF: NL ESO, FUNDIC GLAND POLYPS  . Upper gastrointestinal endoscopy  w/ DIL 2003, 2004, 2006    Allergies  Allergen Reactions  . Hydrocodone     Vomiting and hallunications    Current Outpatient Prescriptions  Medication Sig Dispense Refill  . acetaminophen-codeine (TYLENOL #3) 300-30 MG per tablet Take 1 tablet by mouth every 4 (four) hours as needed.    . Cholecalciferol (VITAMIN D-3 PO) Take by mouth.    . cyclobenzaprine (FLEXERIL) 10 MG tablet Take 1 tablet (10 mg total) by mouth 2 (two) times daily as needed for muscle spasms.    Marland Kitchen dicyclomine (BENTYL) 10 MG capsule Take 1 capsule (10 mg total) by mouth 4 (four) times daily -  before meals and at bedtime.    Marland Kitchen ibuprofen (ADVIL,MOTRIN) 600 MG tablet TAKE 1 TABLET (600 MG TOTAL) BY MOUTH  EVERY 8 (EIGHT) HOURS AS NEEDED FOR PAIN (TAKE WITH MEALS). Not every day   . lisinopril (PRINIVIL,ZESTRIL) 2.5 MG tablet Take 1 tablet (2.5 mg total) by mouth daily.    . metoCLOPramide (REGLAN) 5 MG tablet TAKE 1 TABLET BY MOUTH 30 MINUTES PRIOR TO SUPPER AND AT BEDTIME    . Multiple Vitamin (MULTIVITAMIN) tablet Take 1 tablet by mouth daily.    . pantoprazole (PROTONIX) 40 MG tablet 1 PO 30 MINS PRIOR TO BREAKFAST AND SUPPER    . polyethylene glycol powder (GLYCOLAX/MIRALAX) powder Take 17 g by mouth daily as needed. constipation    . pravastatin (PRAVACHOL) 40 MG tablet Take 1 tablet (40 mg total) by mouth daily.    . ranitidine (ZANTAC) 300 MG tablet 1 PO EVERY MORNING AS NEEDED TO PREVENT HEARTBURN     Family History  Problem Relation Age of Onset  . Colon cancer Mother     LATE 50S    History   Social History  . Marital Status: Single    Spouse Name: N/A    Number of Children: N/A  . Years of Education: N/A   Social History Main Topics  . Smoking status: Never Smoker   . Smokeless tobacco: Not on file  . Alcohol Use: No  . Drug Use: No  . Sexual Activity: Not on file  Review of Systems PER HPI OTHERWISE ALL SYSTEMS ARE NEGATIVE.     Objective:   Physical Exam  Vitals reviewed. Constitutional: She is oriented to person, place, and time. She appears well-developed and well-nourished. No distress.  HENT:  Head: Normocephalic and atraumatic.  Mouth/Throat: Oropharynx is clear and moist. No oropharyngeal exudate.  Eyes: Pupils are equal, round, and reactive to light. No scleral icterus.  Neck: Normal range of motion. Neck supple.  Cardiovascular: Normal rate, regular rhythm and normal heart sounds.   Pulmonary/Chest: Effort normal and breath sounds normal. No respiratory distress.  Abdominal: Soft. Bowel sounds are normal. She exhibits no distension. There is tenderness. There is no rebound and no guarding.  MILD BLQs TTP   Lymphadenopathy:    She has no  cervical adenopathy.  Neurological: She is alert and oriented to person, place, and time.  NO  NEW FOCAL DEFICITS   Psychiatric: She has a normal mood and affect.          Assessment & Plan:

## 2013-12-02 NOTE — Assessment & Plan Note (Addendum)
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  EGD/DIL JUL 30. DISCUSSED PROCEDURE, BENEFITS, AND RISKS OF PROCEDURE. SOFT MECHANICAL DIET FOLLOW UP IN 4 MOS

## 2013-12-02 NOTE — Assessment & Plan Note (Signed)
IN PT WITH MOTHER WHO HAD COLON CA AGE < 60   TCS WITH PROPOFOLJUL 30 DUE TO FAILED CONSCIOUS SEDATION HOLD BENTYL AND TAKE PREPOPIK JUL 29 DISCUSSED PROCEDURE, BENEFITS, AND RISKS OF PROCEDURE.

## 2013-12-02 NOTE — Assessment & Plan Note (Signed)
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

## 2013-12-03 ENCOUNTER — Encounter (HOSPITAL_COMMUNITY): Payer: Self-pay | Admitting: Pharmacy Technician

## 2013-12-03 NOTE — Progress Notes (Signed)
[name] is scheduled at [facility] with Dr. Nyoka Cowden on August 14th at 8:45

## 2013-12-06 ENCOUNTER — Encounter (HOSPITAL_COMMUNITY)
Admission: RE | Admit: 2013-12-06 | Discharge: 2013-12-06 | Disposition: A | Discharge status: Home / Self Care | Payer: PRIVATE HEALTH INSURANCE | Source: Ambulatory Visit | Attending: Gastroenterology | Admitting: Gastroenterology

## 2013-12-06 ENCOUNTER — Other Ambulatory Visit: Payer: Self-pay

## 2013-12-06 ENCOUNTER — Encounter (HOSPITAL_COMMUNITY): Payer: Self-pay

## 2013-12-06 DIAGNOSIS — Z1211 Encounter for screening for malignant neoplasm of colon: Secondary | ICD-10-CM | POA: Diagnosis not present

## 2013-12-06 DIAGNOSIS — E785 Hyperlipidemia, unspecified: Secondary | ICD-10-CM | POA: Diagnosis not present

## 2013-12-06 DIAGNOSIS — Z79899 Other long term (current) drug therapy: Secondary | ICD-10-CM | POA: Diagnosis not present

## 2013-12-06 DIAGNOSIS — K219 Gastro-esophageal reflux disease without esophagitis: Secondary | ICD-10-CM | POA: Diagnosis not present

## 2013-12-06 DIAGNOSIS — Z5309 Procedure and treatment not carried out because of other contraindication: Secondary | ICD-10-CM | POA: Diagnosis not present

## 2013-12-06 DIAGNOSIS — R1310 Dysphagia, unspecified: Secondary | ICD-10-CM | POA: Diagnosis not present

## 2013-12-06 DIAGNOSIS — Z800 Family history of malignant neoplasm of digestive organs: Secondary | ICD-10-CM | POA: Diagnosis not present

## 2013-12-06 DIAGNOSIS — I10 Essential (primary) hypertension: Secondary | ICD-10-CM | POA: Diagnosis not present

## 2013-12-06 LAB — BASIC METABOLIC PANEL
ANION GAP: 10 (ref 5–15)
BUN: 13 mg/dL (ref 6–23)
CALCIUM: 9.5 mg/dL (ref 8.4–10.5)
CO2: 30 meq/L (ref 19–32)
Chloride: 102 mEq/L (ref 96–112)
Creatinine, Ser: 0.65 mg/dL (ref 0.50–1.10)
GFR calc Af Amer: >90 mL/min (ref >90)
GFR calc non Af Amer: >90 mL/min (ref >90)
Glucose, Bld: 153 mg/dL — ABNORMAL HIGH (ref 70–99)
Potassium: 4.6 mEq/L (ref 3.7–5.3)
SODIUM: 142 meq/L (ref 137–147)

## 2013-12-06 LAB — HCG, SERUM, QUALITATIVE: Preg, Serum: NEGATIVE

## 2013-12-06 LAB — HEMOGLOBIN AND HEMATOCRIT, BLOOD
HCT: 38.7 % (ref 36.0–46.0)
Hemoglobin: 13 g/dL (ref 12.0–15.0)

## 2013-12-06 NOTE — Patient Instructions (Signed)
[name]  12/06/2013   Your procedure is scheduled on:   12/09/2013  Report to [facility] at  33  AM.  Call this number if you have problems the morning of surgery: [phone]   Remember:   Do not eat food or drink liquids after midnight.   Take these medicines the morning of surgery with A SIP OF WATER:  Flexaril, lisinopril, reglan, protonix, zantac   Do not wear jewelry, make-up or nail polish.  Do not wear lotions, powders, or perfumes.   Do not shave 48 hours prior to surgery. Men may shave face and neck.  Do not bring valuables to the hospital.  [facility] is not responsible for any belongings or valuables.               Contacts, dentures or bridgework may not be worn into surgery.  Leave suitcase in the car. After surgery it may be brought to your room.  For patients admitted to the hospital, discharge time is determined by your treatment team.               Patients discharged the day of surgery will not be allowed to drive home.  Name and phone number of your driver: family  Special Instructions: N/A   Please read over the following fact sheets that you were given: Pain Booklet, Coughing and Deep Breathing, Surgical Site Infection Prevention, Anesthesia Post-op Instructions and Care and Recovery After Surgery Colonoscopy A colonoscopy is an exam to look at the entire large intestine (colon). This exam can help find problems such as tumors, polyps, inflammation, and areas of bleeding. The exam takes about 1 hour.  LET [facility] CARE PROVIDER KNOW ABOUT:   Any allergies you have.  All medicines you are taking, including vitamins, herbs, eye drops, creams, and over-the-counter medicines.  Previous problems you or members of your family have had with the use of anesthetics.  Any blood disorders you have.  Previous surgeries you have had.  Medical conditions you have. RISKS AND COMPLICATIONS  Generally, this is a safe procedure. However, as with  any procedure, complications can occur. Possible complications include:  Bleeding.  Tearing or rupture of the colon wall.  Reaction to medicines given during the exam.  Infection (rare). BEFORE THE PROCEDURE   Ask your health care provider about changing or stopping your regular medicines.  You may be prescribed an oral bowel prep. This involves drinking a large amount of medicated liquid, starting the day before your procedure. The liquid will cause you to have multiple loose stools until your stool is almost clear or light green. This cleans out your colon in preparation for the procedure.  Do not eat or drink anything else once you have started the bowel prep, unless your health care provider tells you it is safe to do so.  Arrange for someone to drive you home after the procedure. PROCEDURE   You will be given medicine to help you relax (sedative).  You will lie on your side with your knees bent.  A long, flexible tube with a light and camera on the end (colonoscope) will be inserted through the rectum and into the colon. The camera sends video back to a computer screen as it moves through the colon. The colonoscope also releases carbon dioxide gas to inflate the colon. This helps your health care provider see the area better.  During the exam, your health care provider may  take a small tissue sample (biopsy) to be examined under a microscope if any abnormalities are found.  The exam is finished when the entire colon has been viewed. AFTER THE PROCEDURE   Do not drive for 24 hours after the exam.  You may have a small amount of blood in your stool.  You may pass moderate amounts of gas and have mild abdominal cramping or bloating. This is caused by the gas used to inflate your colon during the exam.  Ask when your test results will be ready and how you will get your results. Make sure you get your test results. Document Released: 04/26/2000 Document Revised: 02/17/2013  Document Reviewed: 01/04/2013 [facility] Patient Information 2015 [place], [place]. This information is not intended to replace advice given to you by your health care provider. Make sure you discuss any questions you have with your health care provider. Esophageal Dilatation The esophagus is the long, narrow tube which carries food and liquid from the mouth to the stomach. Esophageal dilatation is the technique used to stretch a blocked or narrowed portion of the esophagus. This procedure is used when a part of the esophagus has become so narrow that it becomes difficult, painful or even impossible to swallow. This is generally an uncomplicated form of treatment. When this is not successful, chest surgery may be required. This is a much more extensive form of treatment with a longer recovery time. CAUSES  Some of the more common causes of blockage or strictures of the esophagus are:  Narrowing from longstanding inflammation (soreness and redness) of the lower esophagus. This comes from the constant exposure of the lower esophagus to the acid which bubbles up from the stomach. Over time this causes scarring and narrowing of the lower esophagus.  Hiatal hernia in which a small part of the stomach bulges (herniates) up through the diaphragm. This can cause a gradual narrowing of the end of the esophagus.  Schatzki ring is a narrow ring of benign (non-cancerous) fibrous tissue which constricts the lower esophagus. The reason for this is not known.  Scleroderma is a connective tissue disorder that affects the esophagus and makes swallowing difficult.  Achalasia is an absence of nerves to the lower esophagus and to the esophageal sphincter. This is the circular muscle between the stomach and esophagus that relaxes to allow food into the stomach. After swallowing, it contracts to keep food in the stomach. This absence of nerves may be congenital (present since birth). This can cause irregular spasms of the  lower esophageal muscle. This spasm does not open up to allow food and fluid through. The result is a persistent blockage with subsequent slow trickling of the esophageal contents into the stomach.  Strictures may develop from swallowing materials which damage the esophagus. Some examples are strong acids or alkalis such as lye.  Growths such as benign (non-cancerous) and malignant (cancerous) tumors can block the esophagus.  Hereditary (present since birth) causes. DIAGNOSIS  Your caregiver often suspects this problem by taking a medical history. They will also do a physical exam. They can then prove their suspicions using X-rays and endoscopy. Endoscopy is an exam in which a tube like a small, flexible telescope is used to look at your esophagus.  TREATMENT There are different stretching (dilating) techniques that can be used. Simple bougie dilatation may be done in the office. This usually takes only a couple minutes. A numbing (anesthetic) spray of the throat is used. Endoscopy, when done, is done in an endoscopy  suite under mild sedation. When fluoroscopy is used, the procedure is performed in X-ray. Other techniques require a little longer time. Recovery is usually quick. There is no waiting time to begin eating and drinking to test success of the treatment. Following are some of the methods used. Narrowing of the esophagus is treated by making it bigger. Commonly this is a mechanical problem which can be treated with stretching. This can be done in different ways. Your caregiver will discuss these with you. Some of the means used are:  A series of graduated (increasing thickness) flexible dilators can be used. These are weighted tubes passed through the esophagus into the stomach. The tubes used become progressively larger until the desired stretched size is reached. Graduated dilators are a simple and quick way of opening the esophagus. No visualization is required.  Another method is the use  of endoscopy to place a flexible wire across the stricture. The endoscope is removed and the wire left in place. A dilator with a hole through it from end to end is guided down the esophagus and across the stricture. One or more of these dilators are passed over the wire. At the end of the exam, the wire is removed. This type of treatment may be performed in the X-ray department under fluoroscopy. An advantage of this procedure is the examiner is visualizing the end opening in the esophagus.  Stretching of the esophagus may be done using balloons. Deflated balloons are placed through the endoscope and across the stricture. This type of balloon dilatation is often done at the time of endoscopy or fluoroscopy. Flexible endoscopy allows the examiner to directly view the stricture. A balloon is inserted in the deflated form into the area of narrowing. It is then inflated with air to a certain pressure that is preset for a given circumference. When inflated, it becomes sausage shaped, stretched, and makes the stricture larger.  Achalasia requires a longer, larger balloon-type dilator. This is frequently done under X-ray control. In this situation, the spastic muscle fibers in the lower esophagus are stretched. All of the above procedures make the passage of food and water into the stomach easier. They also make it easier for stomach contents to reflux back into the esophagus. Special medications may be used following the procedure to help prevent further stricturing. Proton-pump inhibitor medications are good at decreasing the amount of acid in the stomach juice. When stomach juice refluxes into the esophagus, the juice is no longer as acidic and is less likely to burn or scar the esophagus. RISKS AND COMPLICATIONS Esophageal dilatation is usually performed effectively and without problems. Some complications that can occur are:  A small amount of bleeding almost always happens where the stretching takes place.  If this is too excessive it may require more aggressive treatment.  An uncommon complication is perforation (making a hole) of the esophagus. The esophagus is thin. It is easy to make a hole in it. If this happens, an operation may be necessary to repair this.  A small, undetected perforation could lead to an infection in the chest. This can be very serious. HOME CARE INSTRUCTIONS   If you received sedation for your procedure, do not drive, make important decisions, or perform any activities requiring your full coordination. Do not drink alcohol, take sedatives, or use any mind altering chemicals unless instructed by your caregiver.  You may use throat lozenges or warm salt water gargles if you have throat discomfort.  You can begin eating and  drinking normally on return home unless instructed otherwise. Do not purposely try to force large chunks of food down to test the benefits of your procedure.  Mild discomfort can be eased with sips of ice water.  Medications for discomfort may or may not be needed. SEEK IMMEDIATE MEDICAL CARE IF:   You begin vomiting up blood.  You develop black, tarry stools.  You develop chills or an unexplained temperature of over 101F (38.3C)  You develop chest or abdominal pain.  You develop shortness of breath, or feel light-headed or faint.  Your swallowing is becoming more painful, difficult, or you are unable to swallow. MAKE SURE YOU:   Understand these instructions.  Will watch your condition.  Will get help right away if you are not doing well or get worse. Document Released: 06/20/2005 Document Revised: 09/13/2013 Document Reviewed: 08/07/2005 [facility] Patient Information 2015 [place], [place]. This information is not intended to replace advice given to you by your health care provider. Make sure you discuss any questions you have with your health care provider. Esophagogastroduodenoscopy Esophagogastroduodenoscopy (EGD) is a procedure to  examine the lining of the esophagus, stomach, and first part of the small intestine (duodenum). A long, flexible, lighted tube with a camera attached (endoscope) is inserted down the throat to view these organs. This procedure is done to detect problems or abnormalities, such as inflammation, bleeding, ulcers, or growths, in order to treat them. The procedure lasts about 5-20 minutes. It is usually an outpatient procedure, but it may need to be performed in emergency cases in the hospital. LET YOUR CAREGIVER KNOW ABOUT:   Allergies to food or medicine.  All medicines you are taking, including vitamins, herbs, eyedrops, and over-the-counter medicines and creams.  Use of steroids (by mouth or creams).  Previous problems you or members of your family have had with the use of anesthetics.  Any blood disorders you have.  Previous surgeries you have had.  Other health problems you have.  Possibility of pregnancy, if this applies. RISKS AND COMPLICATIONS  Generally, EGD is a safe procedure. However, as with any procedure, complications can occur. Possible complications include:  Infection.  Bleeding.  Tearing (perforation) of the esophagus, stomach, or duodenum.  Difficulty breathing or not being able to breath.  Excessive sweating.  Spasms of the larynx.  Slowed heartbeat.  Low blood pressure. BEFORE THE PROCEDURE  Do not eat or drink anything for 6-8 hours before the procedure or as directed by your caregiver.  Ask your caregiver about changing or stopping your regular medicines.  If you wear dentures, be prepared to remove them before the procedure.  Arrange for someone to drive you home after the procedure. PROCEDURE   A vein will be accessed to give medicines and fluids. A medicine to relax you (sedative) and a pain reliever will be given through that access into the vein.  A numbing medicine (local anesthetic) may be sprayed on your throat for comfort and to stop you  from gagging or coughing.  A mouth guard may be placed in your mouth to protect your teeth and to keep you from biting on the endoscope.  You will be asked to lie on your left side.  The endoscope is inserted down your throat and into the esophagus, stomach, and duodenum.  Air is put through the endoscope to allow your caregiver to view the lining of your esophagus clearly.  The esophagus, stomach, and duodenum is then examined. During the exam, your caregiver may:  Remove  tissue to be examined under a microscope (biopsy) for inflammation, infection, or other medical problems.  Remove growths.  Remove objects (foreign bodies) that are stuck.  Treat any bleeding with medicines or other devices that stop tissues from bleeding (hot cautery, clipping devices).  Widen (dilate) or stretch narrowed areas of the esophagus and stomach.  The endoscope will then be withdrawn. AFTER THE PROCEDURE  You will be taken to a recovery area to be monitored. You will be able to go home once you are stable and alert.  Do not eat or drink anything until the local anesthetic and numbing medicines have worn off. You may choke.  It is normal to feel bloated, have pain with swallowing, or have a sore throat for a short time. This will wear off.  Your caregiver should be able to discuss his or her findings with you. It will take longer to discuss the test results if any biopsies were taken. Document Released: 08/30/2004 Document Revised: 09/13/2013 Document Reviewed: 04/01/2012 [facility] Patient Information 2015 [place], [place]. This information is not intended to replace advice given to you by your health care provider. Make sure you discuss any questions you have with your health care provider. PATIENT INSTRUCTIONS POST-ANESTHESIA  IMMEDIATELY FOLLOWING SURGERY:  Do not drive or operate machinery for the first twenty four hours after surgery.  Do not make any important decisions for twenty four hours  after surgery or while taking narcotic pain medications or sedatives.  If you develop intractable nausea and vomiting or a severe headache please notify your doctor immediately.  FOLLOW-UP:  Please make an appointment with your surgeon as instructed. You do not need to follow up with anesthesia unless specifically instructed to do so.  WOUND CARE INSTRUCTIONS (if applicable):  Keep a dry clean dressing on the anesthesia/puncture wound site if there is drainage.  Once the wound has quit draining you may leave it open to air.  Generally you should leave the bandage intact for twenty four hours unless there is drainage.  If the epidural site drains for more than 36-48 hours please call the anesthesia department.  QUESTIONS?:  Please feel free to call your physician or the hospital operator if you have any questions, and they will be happy to assist you.

## 2013-12-06 NOTE — Pre-Procedure Instructions (Signed)
Patient given information to sign up for my chart. 

## 2013-12-07 NOTE — Progress Notes (Signed)
Reminder in epic °

## 2013-12-08 ENCOUNTER — Ambulatory Visit: Admit: 2013-12-08 | Payer: Self-pay | Admitting: Gastroenterology

## 2013-12-08 SURGERY — COLONOSCOPY
Anesthesia: Moderate Sedation

## 2013-12-09 ENCOUNTER — Ambulatory Visit (HOSPITAL_COMMUNITY): Payer: PRIVATE HEALTH INSURANCE | Admitting: Anesthesiology

## 2013-12-09 ENCOUNTER — Encounter (HOSPITAL_COMMUNITY): Payer: Self-pay | Admitting: *Deleted

## 2013-12-09 ENCOUNTER — Encounter (HOSPITAL_COMMUNITY): Payer: PRIVATE HEALTH INSURANCE | Admitting: Anesthesiology

## 2013-12-09 ENCOUNTER — Telehealth: Payer: Self-pay | Admitting: Gastroenterology

## 2013-12-09 ENCOUNTER — Ambulatory Visit (HOSPITAL_COMMUNITY)
Admission: RE | Admit: 2013-12-09 | Discharge: 2013-12-09 | Disposition: A | Discharge status: Home / Self Care | Payer: PRIVATE HEALTH INSURANCE | Source: Ambulatory Visit | Attending: Gastroenterology | Admitting: Gastroenterology

## 2013-12-09 ENCOUNTER — Ambulatory Visit (HOSPITAL_COMMUNITY): Payer: PRIVATE HEALTH INSURANCE

## 2013-12-09 ENCOUNTER — Encounter (HOSPITAL_COMMUNITY)
Admission: RE | Disposition: A | Discharge status: Home / Self Care | Payer: Self-pay | Source: Ambulatory Visit | Attending: Gastroenterology

## 2013-12-09 DIAGNOSIS — E785 Hyperlipidemia, unspecified: Secondary | ICD-10-CM | POA: Insufficient documentation

## 2013-12-09 DIAGNOSIS — T8859XA Other complications of anesthesia, initial encounter: Secondary | ICD-10-CM

## 2013-12-09 DIAGNOSIS — Z79899 Other long term (current) drug therapy: Secondary | ICD-10-CM | POA: Insufficient documentation

## 2013-12-09 DIAGNOSIS — Z800 Family history of malignant neoplasm of digestive organs: Secondary | ICD-10-CM | POA: Insufficient documentation

## 2013-12-09 DIAGNOSIS — K219 Gastro-esophageal reflux disease without esophagitis: Secondary | ICD-10-CM | POA: Insufficient documentation

## 2013-12-09 DIAGNOSIS — Z1211 Encounter for screening for malignant neoplasm of colon: Secondary | ICD-10-CM | POA: Diagnosis not present

## 2013-12-09 DIAGNOSIS — I10 Essential (primary) hypertension: Secondary | ICD-10-CM | POA: Insufficient documentation

## 2013-12-09 DIAGNOSIS — R1310 Dysphagia, unspecified: Secondary | ICD-10-CM | POA: Diagnosis not present

## 2013-12-09 DIAGNOSIS — Z5309 Procedure and treatment not carried out because of other contraindication: Secondary | ICD-10-CM | POA: Diagnosis not present

## 2013-12-09 HISTORY — DX: Other complications of anesthesia, initial encounter: T88.59XA

## 2013-12-09 HISTORY — DX: Adverse effect of unspecified anesthetic, initial encounter: T41.45XA

## 2013-12-09 SURGERY — CANCELLED PROCEDURE
Anesthesia: Monitor Anesthesia Care

## 2013-12-09 MED ORDER — ONDANSETRON HCL 4 MG/2ML IJ SOLN
4.0000 mg | Freq: Once | INTRAMUSCULAR | Status: AC
  Administered 2013-12-09: 4 mg via INTRAVENOUS
  Filled 2013-12-09: qty 2

## 2013-12-09 MED ORDER — FENTANYL CITRATE 0.05 MG/ML IJ SOLN
25.0000 ug | INTRAMUSCULAR | Status: AC
  Administered 2013-12-09 (×2): 25 ug via INTRAVENOUS
  Filled 2013-12-09: qty 2

## 2013-12-09 MED ORDER — LACTATED RINGERS IV SOLN
INTRAVENOUS | Status: DC
  Administered 2013-12-09: 09:00:00 via INTRAVENOUS

## 2013-12-09 MED ORDER — FENTANYL CITRATE 0.05 MG/ML IJ SOLN: 25.0000 ug | INTRAMUSCULAR | Status: DC | PRN

## 2013-12-09 MED ORDER — LIDOCAINE HCL (PF) 1 % IJ SOLN
INTRAMUSCULAR | Status: AC
  Filled 2013-12-09: qty 5

## 2013-12-09 MED ORDER — LIDOCAINE VISCOUS 2 % MT SOLN
OROMUCOSAL | Status: AC
  Administered 2013-12-09: 4 mL
  Filled 2013-12-09: qty 15

## 2013-12-09 MED ORDER — LIDOCAINE VISCOUS 2 % MT SOLN
OROMUCOSAL | Status: AC
  Filled 2013-12-09: qty 15

## 2013-12-09 MED ORDER — MIDAZOLAM HCL 2 MG/2ML IJ SOLN
1.0000 mg | INTRAMUSCULAR | Status: DC | PRN
  Administered 2013-12-09 (×2): 1 mg via INTRAVENOUS
  Filled 2013-12-09: qty 2

## 2013-12-09 MED ORDER — GLYCOPYRROLATE 0.2 MG/ML IJ SOLN
0.2000 mg | Freq: Once | INTRAMUSCULAR | Status: AC
  Administered 2013-12-09: 0.2 mg via INTRAVENOUS
  Filled 2013-12-09: qty 1

## 2013-12-09 MED ORDER — PROPOFOL INFUSION 10 MG/ML OPTIME
INTRAVENOUS | Status: DC | PRN
  Administered 2013-12-09: 100 ug/kg/min via INTRAVENOUS

## 2013-12-09 MED ORDER — PROPOFOL 10 MG/ML IV BOLUS
INTRAVENOUS | Status: AC
  Filled 2013-12-09: qty 20

## 2013-12-09 MED ORDER — LIDOCAINE VISCOUS 2 % MT SOLN: 15.0000 mL | Freq: Once | OROMUCOSAL | Status: DC

## 2013-12-09 MED ORDER — ONDANSETRON HCL 4 MG/2ML IJ SOLN: 4.0000 mg | Freq: Once | INTRAMUSCULAR | Status: DC | PRN

## 2013-12-09 SURGICAL SUPPLY — 26 items
BLOCK BITE 60FR ADLT L/F BLUE (MISCELLANEOUS) ×3 IMPLANT
ELECT REM PT RETURN 9FT ADLT (ELECTROSURGICAL)
ELECTRODE REM PT RTRN 9FT ADLT (ELECTROSURGICAL) IMPLANT
FCP BXJMBJMB 240X2.8X (CUTTING FORCEPS)
FLOOR PAD 36X40 (MISCELLANEOUS) ×3
FORCEPS BIOP RAD 4 LRG CAP 4 (CUTTING FORCEPS) IMPLANT
FORCEPS BIOP RJ4 240 W/NDL (CUTTING FORCEPS)
FORCEPS BXJMBJMB 240X2.8X (CUTTING FORCEPS) IMPLANT
FORMALIN 10 PREFIL 20ML (MISCELLANEOUS) IMPLANT
INJECTOR/SNARE I SNARE (MISCELLANEOUS) IMPLANT
KIT CLEAN ENDO COMPLIANCE (KITS) ×3 IMPLANT
LUBRICANT JELLY 4.5OZ STERILE (MISCELLANEOUS) ×3 IMPLANT
MANIFOLD NEPTUNE II (INSTRUMENTS) ×3 IMPLANT
NEEDLE SCLEROTHERAPY 25GX240 (NEEDLE) IMPLANT
PAD FLOOR 36X40 (MISCELLANEOUS) ×1 IMPLANT
PROBE APC STR FIRE (PROBE) IMPLANT
PROBE INJECTION GOLD (MISCELLANEOUS)
PROBE INJECTION GOLD 7FR (MISCELLANEOUS) IMPLANT
SNARE ROTATE MED OVAL 20MM (MISCELLANEOUS) IMPLANT
SNARE SHORT THROW 13M SML OVAL (MISCELLANEOUS) IMPLANT
SYR 50ML LL SCALE MARK (SYRINGE) ×3 IMPLANT
SYR INFLATION 60ML (SYRINGE) IMPLANT
TRAP SPECIMEN MUCOUS 40CC (MISCELLANEOUS) IMPLANT
TUBING ENDO SMARTCAP PENTAX (MISCELLANEOUS) ×3 IMPLANT
TUBING IRRIGATION ENDOGATOR (MISCELLANEOUS) ×3 IMPLANT
WATER STERILE IRR 1000ML POUR (IV SOLUTION) ×3 IMPLANT

## 2013-12-09 NOTE — Discharge Instructions (Signed)
YOU COULD NOT HAVE YOUR PROCEDURE TODAY BECAUSE YOUR OXYGEN LEVEL DROPPED AND YOUR STOPPED BREATHING WITH ONLY A SMALL AMOUNT OF SEDATION.   I MADE A REFERRAL FOR YOU TO GO TO [facility] TO BE SEEN BY GASTROENTEROLOGY AND ANESTHESIA PRIOR TO ADDITIONAL ENDOSCOPY.  DR. Patsey Berthold Alaia.Ivy) [phone]] FEELS IT WOULD BE ONLY SAFE TO HAVE SEDATION WITH GENERAL ANESTHESIA.   COMPLETE YOUR SWALLOWING EVALUATION AT [facility]. IF THEY DECIDE TO DO AN UPPER ENDOSCOPY AT [facility], LET THEM KNOW YOU NEED A SCREENING COLONOSCOPY AS WELL. OTHERWISE COMPLETE A SCREENING COLONOSCOPY IN SEP 2015.   CALL ME IF YOU HAVE A PROBLEM WITH THE PREP.   FOLLOW UP IN NOV 2015 E30 DYSPHAGIA/GERD.

## 2013-12-09 NOTE — Transfer of Care (Addendum)
Immediate Anesthesia Transfer of Care Note  Patient: [name]  Procedure(s) Performed: Procedure(s): COLONOSCOPY WITH PROPOFOL (N/A) ESOPHAGOGASTRODUODENOSCOPY (EGD) WITH PROPOFOL (N/A) SAVORY ESOPHAGEAL DILATION (N/A)  Patient Location: PACU  Anesthesia Type:MAC  Level of Consciousness: awake and patient cooperative  Airway & Oxygen Therapy: Patient Spontanous Breathing and non-rebreather face mask  Post-op Assessment: Report given to PACU RN and Post -op Vital signs reviewed and stable  Post vital signs: Reviewed and stable  Complications: Case aborted due to unstable  respiratory status. See intra-op events notes.

## 2013-12-09 NOTE — H&P (View-Only) (Signed)
Subjective:    Patient ID: [name], female    DOB: February 06, 1966, 48 y.o.   MRN: [id number] Anthoney Harada, MD   HPI TROUBLE SWALLOWING SOLIDS AND LIQUIDS. DOESN'T INDUCE VOMITING BURNING SENSATION IN CHEST-OFF AND ON, EVERY DAY. BMS; EVERY DAY.-NL. BELLY HURTS OFF AND ON SOMETIME. SOB SOMETIMES.   PT DENIES FEVER, CHILLS, HEMATOCHEZIA, nausea, vomiting, melena, diarrhea, OR constipation.  REVIEWED EMR FROM 2009 TO PRESENT.  Past Medical History  Diagnosis Date  . GERD (gastroesophageal reflux disease) 2007    BPE NL ESO, REFLUX  . Dysphagia 2003    2O to GERD/HH, ?difficulty with large food bolus due to neuromuscular weakness  . Constipation - functional   . Hiatal hernia   . HTN (hypertension)   . Cerebral palsy   . Epigastric pain MAY 2012 ABD [place] NL GBLIVPAN    2o to GERD V. NON-ULCER DYSPEPSIA  . Hyperlipidemia    Past Surgical History  Procedure Laterality Date  . Colonoscopy  APR 2009 with proprofol    SLF: SIMPLE ADENOMAS  . Upper gastrointestinal endoscopy  MAY 2012 DIL 16 mm    SLF: NO DEFINITE STRICTURE APPRECIATED  . Upper gastrointestinal endoscopy  FEB 2009-HYPOXIA REQUIRING NARCAN, & VERSED, D50V4    SLF: NL ESO, FUNDIC GLAND POLYPS  . Upper gastrointestinal endoscopy  w/ DIL 2003, 2004, 2006    Allergies  Allergen Reactions  . Hydrocodone     Vomiting and hallunications    Current Outpatient Prescriptions  Medication Sig Dispense Refill  . acetaminophen-codeine (TYLENOL #3) 300-30 MG per tablet Take 1 tablet by mouth every 4 (four) hours as needed.    . Cholecalciferol (VITAMIN D-3 PO) Take by mouth.    . cyclobenzaprine (FLEXERIL) 10 MG tablet Take 1 tablet (10 mg total) by mouth 2 (two) times daily as needed for muscle spasms.    Marland Kitchen dicyclomine (BENTYL) 10 MG capsule Take 1 capsule (10 mg total) by mouth 4 (four) times daily -  before meals and at bedtime.    Marland Kitchen ibuprofen (ADVIL,MOTRIN) 600 MG tablet TAKE 1 TABLET (600 MG TOTAL) BY MOUTH  EVERY 8 (EIGHT) HOURS AS NEEDED FOR PAIN (TAKE WITH MEALS). Not every day   . lisinopril (PRINIVIL,ZESTRIL) 2.5 MG tablet Take 1 tablet (2.5 mg total) by mouth daily.    . metoCLOPramide (REGLAN) 5 MG tablet TAKE 1 TABLET BY MOUTH 30 MINUTES PRIOR TO SUPPER AND AT BEDTIME    . Multiple Vitamin (MULTIVITAMIN) tablet Take 1 tablet by mouth daily.    . pantoprazole (PROTONIX) 40 MG tablet 1 PO 30 MINS PRIOR TO BREAKFAST AND SUPPER    . polyethylene glycol powder (GLYCOLAX/MIRALAX) powder Take 17 g by mouth daily as needed. constipation    . pravastatin (PRAVACHOL) 40 MG tablet Take 1 tablet (40 mg total) by mouth daily.    . ranitidine (ZANTAC) 300 MG tablet 1 PO EVERY MORNING AS NEEDED TO PREVENT HEARTBURN     Family History  Problem Relation Age of Onset  . Colon cancer Mother     LATE 50S    History   Social History  . Marital Status: Single    Spouse Name: N/A    Number of Children: N/A  . Years of Education: N/A   Social History Main Topics  . Smoking status: Never Smoker   . Smokeless tobacco: Not on file  . Alcohol Use: No  . Drug Use: No  . Sexual Activity: Not on file  Review of Systems PER HPI OTHERWISE ALL SYSTEMS ARE NEGATIVE.     Objective:   Physical Exam  Vitals reviewed. Constitutional: She is oriented to person, place, and time. She appears well-developed and well-nourished. No distress.  HENT:  Head: Normocephalic and atraumatic.  Mouth/Throat: Oropharynx is clear and moist. No oropharyngeal exudate.  Eyes: Pupils are equal, round, and reactive to light. No scleral icterus.  Neck: Normal range of motion. Neck supple.  Cardiovascular: Normal rate, regular rhythm and normal heart sounds.   Pulmonary/Chest: Effort normal and breath sounds normal. No respiratory distress.  Abdominal: Soft. Bowel sounds are normal. She exhibits no distension. There is tenderness. There is no rebound and no guarding.  MILD BLQs TTP   Lymphadenopathy:    She has no  cervical adenopathy.  Neurological: She is alert and oriented to person, place, and time.  NO  NEW FOCAL DEFICITS   Psychiatric: She has a normal mood and affect.          Assessment & Plan:

## 2013-12-09 NOTE — Telephone Encounter (Signed)
PT PREPPED FOR TCS/EGD/DIL. GIVEN ANESTHESIA AND PT DEVELOPED RESPIRATORY COMPROMISE AND HYPOXIA. ANESTHESIA RECOMMENDS PT BE CONSIDERED FOR TCS/POSSIBLE EGD/DIL IN SEP 2015 WITH GENERAL ANESTHESIA, Dx; SCREENING/DYSPHAGIA.Marland Kitchen  REFER TO [facility] GI AND ANESTHESIA PERI-OP CLINIC FOR AN EVALUATION.

## 2013-12-09 NOTE — Addendum Note (Signed)
Addendum created 12/09/13 1047 by Vista Deck, CRNA   Modules edited: Anesthesia Events, Notes Section   Notes Section:  File: [id number]

## 2013-12-09 NOTE — Anesthesia Preprocedure Evaluation (Signed)
Anesthesia Evaluation  Patient identified by MRN, date of birth, ID band Patient awake    Reviewed: Allergy & Precautions, H&P , NPO status , Patient's Chart, lab work & pertinent test results  Airway Mallampati: II TM Distance: >3 FB     Dental  (+) Teeth Intact   Pulmonary  breath sounds clear to auscultation        Cardiovascular hypertension, Pt. on medications Rhythm:Regular     Neuro/Psych Cerebral palsy   Neuromuscular disease    GI/Hepatic hiatal hernia, GERD-  ,  Endo/Other    Renal/GU      Musculoskeletal   Abdominal   Peds  Hematology   Anesthesia Other Findings   Reproductive/Obstetrics                           Anesthesia Physical Anesthesia Plan  ASA: III  Anesthesia Plan: MAC   Post-op Pain Management:    Induction: Intravenous  Airway Management Planned: Simple Face Mask  Additional Equipment:   Intra-op Plan:   Post-operative Plan:   Informed Consent: I have reviewed the patients History and Physical, chart, labs and discussed the procedure including the risks, benefits and alternatives for the proposed anesthesia with the patient or authorized representative who has indicated his/her understanding and acceptance.     Plan Discussed with:   Anesthesia Plan Comments:         Anesthesia Quick Evaluation

## 2013-12-09 NOTE — Interval H&P Note (Signed)
History and Physical Interval Note:  12/09/2013 8:19 AM  [name]  has presented today for surgery, with the diagnosis of DYSPHAGIA  AND SCREENING COLONOSCOPY  The various methods of treatment have been discussed with the patient and family. After consideration of risks, benefits and other options for treatment, the patient has consented to  Procedure(s) with comments: COLONOSCOPY WITH PROPOFOL (N/A) - 730-Dr. Request Time 9:00am ESOPHAGOGASTRODUODENOSCOPY (EGD) WITH PROPOFOL (N/A) SAVORY DILATION (N/A) as a surgical intervention .  The patient's history has been reviewed, patient examined, no change in status, stable for surgery.  I have reviewed the patient's chart and labs.  Questions were answered to the patient's satisfaction.     [employer]

## 2013-12-09 NOTE — Anesthesia Postprocedure Evaluation (Addendum)
  Anesthesia Post-op Note  Patient: [name]  Procedure(s) Performed: Procedure(s): CANCELLED PROCEDURE  Patient Location: PACU  Anesthesia Type:MAC  Level of Consciousness: awake and patient cooperative  Airway and Oxygen Therapy: Patient Spontanous Breathing  Post-op Pain: none  Post-op Assessment: Post-op Vital signs reviewed, Patient's Cardiovascular Status Stable, Respiratory Function Stable, Patent Airway and No signs of Nausea or vomiting  Post-op Vital Signs: Reviewed and stable  Last Vitals:  Filed Vitals:   12/09/13 1015  BP: 121/89  Pulse: 100  Temp:   Resp: 27     Chest xray clear, no aspiration. Dr. Patsey Berthold and DR. Fields speaking with patient and patient states understanding of respiratory complications.

## 2013-12-09 NOTE — Telephone Encounter (Signed)
Referral has been made to [facility], they will contact [name] with date & time

## 2013-12-09 NOTE — Anesthesia Procedure Notes (Signed)
Procedure Name: MAC Date/Time: 12/09/2013 9:22 AM Performed by: Vista Deck Pre-anesthesia Checklist: Patient identified, Emergency Drugs available, Suction available, Timeout performed and Patient being monitored Patient Re-evaluated:Patient Re-evaluated prior to inductionOxygen Delivery Method: Non-rebreather mask

## 2013-12-17 ENCOUNTER — Other Ambulatory Visit: Payer: Self-pay | Admitting: Gastroenterology

## 2013-12-20 ENCOUNTER — Other Ambulatory Visit: Payer: Self-pay | Admitting: *Deleted

## 2013-12-20 DIAGNOSIS — K449 Diaphragmatic hernia without obstruction or gangrene: Secondary | ICD-10-CM

## 2013-12-20 MED ORDER — PANTOPRAZOLE SODIUM 40 MG PO TBEC: DELAYED_RELEASE_TABLET | ORAL | Status: DC

## 2013-12-22 ENCOUNTER — Telehealth: Payer: Self-pay | Admitting: Family Medicine

## 2013-12-22 ENCOUNTER — Other Ambulatory Visit: Payer: Self-pay | Admitting: Gastroenterology

## 2013-12-22 NOTE — Telephone Encounter (Signed)
[facility] Pt aware

## 2013-12-22 NOTE — Telephone Encounter (Signed)
I filled rx for bentyl on Monday. Please check with the pharmacy.

## 2014-01-24 LAB — HM COLONOSCOPY

## 2014-02-02 ENCOUNTER — Other Ambulatory Visit: Payer: Self-pay | Admitting: Family Medicine

## 2014-02-03 ENCOUNTER — Other Ambulatory Visit: Payer: Self-pay | Admitting: Family Medicine

## 2014-02-22 ENCOUNTER — Other Ambulatory Visit: Payer: Self-pay | Admitting: *Deleted

## 2014-02-22 DIAGNOSIS — E785 Hyperlipidemia, unspecified: Secondary | ICD-10-CM

## 2014-02-22 MED ORDER — PRAVASTATIN SODIUM 40 MG PO TABS: 40.0000 mg | ORAL_TABLET | Freq: Every day | ORAL | Status: DC

## 2014-02-28 ENCOUNTER — Encounter: Payer: Self-pay | Admitting: Gastroenterology

## 2014-03-23 ENCOUNTER — Other Ambulatory Visit: Payer: Self-pay | Admitting: Family Medicine

## 2014-04-06 ENCOUNTER — Other Ambulatory Visit: Payer: Self-pay | Admitting: Family Medicine

## 2014-04-06 MED ORDER — IBUPROFEN 600 MG PO TABS: ORAL_TABLET | ORAL | Status: DC

## 2014-04-27 ENCOUNTER — Other Ambulatory Visit: Payer: Self-pay | Admitting: Gastroenterology

## 2014-05-04 ENCOUNTER — Other Ambulatory Visit: Payer: Self-pay

## 2014-05-04 MED ORDER — METOCLOPRAMIDE HCL 5 MG PO TABS: ORAL_TABLET | ORAL | Status: DC

## 2014-05-09 ENCOUNTER — Telehealth: Payer: Self-pay

## 2014-05-09 ENCOUNTER — Other Ambulatory Visit: Payer: Self-pay

## 2014-05-09 MED ORDER — DICYCLOMINE HCL 10 MG PO CAPS: 10.0000 mg | ORAL_CAPSULE | Freq: Three times a day (TID) | ORAL | Status: DC

## 2014-05-09 NOTE — Telephone Encounter (Signed)
Completed.

## 2014-05-09 NOTE — Telephone Encounter (Signed)
Pt was given refill for Dicyclomine on 04/27/2014. Pharmacy sent a request to have it for 90 day supply.

## 2014-05-16 ENCOUNTER — Other Ambulatory Visit: Payer: Self-pay | Admitting: *Deleted

## 2014-05-16 MED ORDER — LISINOPRIL 2.5 MG PO TABS
2.5000 mg | ORAL_TABLET | Freq: Every day | ORAL | Status: DC
Start: 2014-05-16 — End: 2014-08-18

## 2014-05-20 ENCOUNTER — Other Ambulatory Visit: Payer: Self-pay | Admitting: *Deleted

## 2014-05-20 MED ORDER — PANTOPRAZOLE SODIUM 40 MG PO TBEC: DELAYED_RELEASE_TABLET | ORAL | Status: DC

## 2014-05-27 ENCOUNTER — Other Ambulatory Visit (HOSPITAL_COMMUNITY)
Admission: RE | Admit: 2014-05-27 | Discharge: 2014-05-27 | Disposition: A | Discharge status: Home / Self Care | Payer: Medicaid Other | Source: Ambulatory Visit | Attending: Obstetrics & Gynecology | Admitting: Obstetrics & Gynecology

## 2014-05-27 ENCOUNTER — Ambulatory Visit (INDEPENDENT_AMBULATORY_CARE_PROVIDER_SITE_OTHER): Payer: Medicare Other | Admitting: Obstetrics & Gynecology

## 2014-05-27 ENCOUNTER — Encounter: Payer: Self-pay | Admitting: Obstetrics & Gynecology

## 2014-05-27 VITALS — BP 140/80 | Wt 120.0 lb

## 2014-05-27 DIAGNOSIS — Z01419 Encounter for gynecological examination (general) (routine) without abnormal findings: Secondary | ICD-10-CM | POA: Diagnosis not present

## 2014-05-27 DIAGNOSIS — Z1151 Encounter for screening for human papillomavirus (HPV): Secondary | ICD-10-CM | POA: Diagnosis present

## 2014-05-27 MED ORDER — MEGESTROL ACETATE 40 MG PO TABS: 40.0000 mg | ORAL_TABLET | Freq: Every day | ORAL | Status: DC

## 2014-05-27 NOTE — Progress Notes (Signed)
Patient ID: [name], female   DOB: [birth year], 49 y.o.   MRN: [id number] Subjective:     [name] is a 49 y.o. female here for a routine exam.  Patient's last menstrual period was 05/17/2014. No obstetric history on file. Birth Control Method:  na Menstrual Calendar(currently): monthly  Current complaints: no active except still has periods.   Current acute medical issues:  Cerebral palsey   Recent Gynecologic History Patient's last menstrual period was 05/17/2014. Last Pap: ?,  normal Last mammogram: 07/2013,  normal  Past Medical History  Diagnosis Date  . GERD (gastroesophageal reflux disease) 2007    BPE NL ESO, REFLUX  . Dysphagia 2003    2O to GERD/HH, ?difficulty with large food bolus due to neuromuscular weakness  . Constipation - functional   . Hiatal hernia   . HTN (hypertension)   . Cerebral palsy   . Epigastric pain MAY 2012 ABD [place] NL GBLIVPAN    2o to GERD V. NON-ULCER DYSPEPSIA  . Hyperlipidemia   . Complication of anesthesia 12/09/2013    Respiratory instability with 10 mg Propofol.Poor gag reflex.    Past Surgical History  Procedure Laterality Date  . Colonoscopy  APR 2009 with proprofol    SLF: SIMPLE ADENOMAS  . Upper gastrointestinal endoscopy  MAY 2012 DIL 16 mm    SLF: NO DEFINITE STRICTURE APPRECIATED  . Upper gastrointestinal endoscopy  FEB 2009-HYPOXIA REQUIRING NARCAN, & VERSED, D50V4    SLF: NL ESO, FUNDIC GLAND POLYPS  . Upper gastrointestinal endoscopy  w/ DIL 2003, 2004, 2006    OB History    No data available      History   Social History  . Marital Status: Single    Spouse Name: N/A    Number of Children: N/A  . Years of Education: N/A   Social History Main Topics  . Smoking status: Never Smoker   . Smokeless tobacco: None  . Alcohol Use: No  . Drug Use: No  . Sexual Activity: Not Currently    Birth Control/ Protection: None   Other Topics Concern  . None   Social History Narrative    Family History  Problem  Relation Age of Onset  . Colon cancer Mother     LATE 50S     Current outpatient prescriptions:  .  ibuprofen (ADVIL,MOTRIN) 600 MG tablet, TAKE 1 TABLET (600 MG TOTAL) BY MOUTH EVERY 8 (EIGHT) HOURS AS NEEDED FOR PAIN (TAKE WITH MEALS)., Disp: 30 tablet, Rfl: 0 .  lisinopril (PRINIVIL,ZESTRIL) 2.5 MG tablet, Take 1 tablet (2.5 mg total) by mouth daily., Disp: 90 tablet, Rfl: 0 .  Multiple Vitamin (MULTIVITAMIN) tablet, Take 1 tablet by mouth daily., Disp: , Rfl:  .  pantoprazole (PROTONIX) 40 MG tablet, TAKE 1 TABLET BY MOUTH 30 MINUTES PRIOR TO BREAKFAST AND SUPPER, Disp: 90 tablet, Rfl: 0 .  polyethylene glycol powder (GLYCOLAX/MIRALAX) powder, Take 17 g by mouth daily as needed. constipation, Disp: , Rfl:  .  pravastatin (PRAVACHOL) 40 MG tablet, Take 1 tablet (40 mg total) by mouth daily., Disp: 90 tablet, Rfl: 0 .  ranitidine (ZANTAC) 300 MG tablet, 1 PO EVERY MORNING AS NEEDED TO PREVENT HEARTBURN, Disp: 90 tablet, Rfl: 1 .  dicyclomine (BENTYL) 10 MG capsule, Take 1 capsule (10 mg total) by mouth 4 (four) times daily -  before meals and at bedtime. (Patient not taking: Reported on 05/27/2014), Disp: 360 capsule, Rfl: 3 .  metoCLOPramide (REGLAN) 5 MG tablet, TAKE 1  TABLET BY MOUTH 30 MINUTES PRIOR TO SUPPER AND AT BEDTIME (Patient not taking: Reported on 05/27/2014), Disp: 60 tablet, Rfl: 11 .  polyethylene glycol-electrolytes (TRILYTE) 420 G solution, Take 4,000 mLs by mouth once. (Patient not taking: Reported on 05/27/2014), Disp: 4000 mL, Rfl: 0  Review of Systems  Review of Systems  Constitutional: Negative for fever, chills, weight loss, malaise/fatigue and diaphoresis.  HENT: Negative for hearing loss, ear pain, nosebleeds, congestion, sore throat, neck pain, tinnitus and ear discharge.   Eyes: Negative for blurred vision, double vision, photophobia, pain, discharge and redness.  Respiratory: Negative for cough, hemoptysis, sputum production, shortness of breath, wheezing and  stridor.   Cardiovascular: Negative for chest pain, palpitations, orthopnea, claudication, leg swelling and PND.  Gastrointestinal: negative for abdominal pain. Negative for heartburn, nausea, vomiting, diarrhea, constipation, blood in stool and melena.  Genitourinary: Negative for dysuria, urgency, frequency, hematuria and flank pain.  Musculoskeletal: Negative for myalgias, back pain, joint pain and falls.  Skin: Negative for itching and rash.  Neurological: Negative for dizziness, tingling, tremors, sensory change, speech change, focal weakness, seizures, loss of consciousness, weakness and headaches.  Endo/Heme/Allergies: Negative for environmental allergies and polydipsia. Does not bruise/bleed easily.  Psychiatric/Behavioral: Negative for depression, suicidal ideas, hallucinations, memory loss and substance abuse. The patient is not nervous/anxious and does not have insomnia.        Objective:  Blood pressure 140/80, weight 120 lb (54.432 kg), last menstrual period 05/17/2014.   Physical Exam  Vitals reviewed. Constitutional: She is oriented to person, place, and time. She appears well-developed and well-nourished.  HENT:  Head: Normocephalic and atraumatic.        Right Ear: External ear normal.  Left Ear: External ear normal.  Nose: Nose normal.  Mouth/Throat: Oropharynx is clear and moist.  Eyes: Conjunctivae and EOM are normal. Pupils are equal, round, and reactive to light. Right eye exhibits no discharge. Left eye exhibits no discharge. No scleral icterus.  Neck: Normal range of motion. Neck supple. No tracheal deviation present. No thyromegaly present.  Cardiovascular: Normal rate, regular rhythm, normal heart sounds and intact distal pulses.  Exam reveals no gallop and no friction rub.   No murmur heard. Respiratory: Effort normal and breath sounds normal. No respiratory distress. She has no wheezes. She has no rales. She exhibits no tenderness.  GI: Soft. Bowel sounds are  normal. She exhibits no distension and no mass. There is no tenderness. There is no rebound and no guarding.  Genitourinary:  Breasts no masses skin changes or nipple changes bilaterally      Vulva is normal without lesions Vagina is pink moist without discharge Cervix normal in appearance and pap is done Uterus is normal size shape and contour Adnexa is negative with normal sized ovaries  Rectal    hemoccult negative, normal tone, no masses  Musculoskeletal: Normal range of motion. She exhibits no edema and no tenderness.  Neurological: She is alert and oriented to person, place, and time. She has normal reflexes. She displays normal reflexes. No cranial nerve deficit. She exhibits normal muscle tone. Coordination normal.  Skin: Skin is warm and dry. No rash noted. No erythema. No pallor.  Psychiatric: She has a normal mood and affect. Her behavior is normal. Judgment and thought content normal.       Assessment:     Female with cerebral palsey .    Plan:    Follow up in: 3 years.

## 2014-05-27 NOTE — Addendum Note (Signed)
Addended by: Doyne Keel on: 05/27/2014 11:54 AM   Modules accepted: Orders

## 2014-05-27 NOTE — Addendum Note (Signed)
Addended by: Florian Buff on: 05/27/2014 11:58 AM   Modules accepted: Orders

## 2014-05-31 LAB — CYTOLOGY - PAP

## 2014-06-19 ENCOUNTER — Other Ambulatory Visit: Payer: Self-pay | Admitting: Family Medicine

## 2014-07-14 ENCOUNTER — Telehealth: Payer: Self-pay | Admitting: Gastroenterology

## 2014-07-14 NOTE — Telephone Encounter (Signed)
Patient's sister called to make OV with [facility] and is aware of OV on 4/7.  Then she said that she will need [facility] [place]. Pantoprazole and prostatin. Please advise.

## 2014-07-14 NOTE — Telephone Encounter (Signed)
I called and spoke to caregiver, Kennyth Lose. She was not sure which prescriptions were needed.  She will have pt's sister call. She had mentioned Lisinopril and I told her PCP needed to do that. She said PCP was no longer at [facility] , he is at another office. Will wait for phone call from sister.

## 2014-07-15 ENCOUNTER — Telehealth: Payer: Self-pay | Admitting: Gastroenterology

## 2014-07-15 DIAGNOSIS — E785 Hyperlipidemia, unspecified: Secondary | ICD-10-CM

## 2014-07-15 MED ORDER — PANTOPRAZOLE SODIUM 40 MG PO TBEC: DELAYED_RELEASE_TABLET | ORAL | Status: DC

## 2014-07-15 MED ORDER — PRAVASTATIN SODIUM 40 MG PO TABS
40.0000 mg | ORAL_TABLET | Freq: Every day | ORAL | Status: DC
Start: 2014-07-15 — End: 2014-08-18

## 2014-07-15 NOTE — Telephone Encounter (Signed)
See note of 07/15/2014.

## 2014-07-15 NOTE — Telephone Encounter (Signed)
I spoke to pt's sister, [name]. She said pt's PCP left [facility] ( Dr. Jacelyn Grip) and they have to wait til April to have another appt there with physicain that takes Medicaid.   She has appt here on 08/18/2014 with Dr. Oneida Alar and would like Rx for the pantoprazole. She also is out of her Pravastatin and [facility] refuses to refill until she comes back to the office there, it has been over 6 months since she was seen there. Pt is out of that also. I told her we do not do cholesterol meds but I will let Dr. Oneida Alar know.

## 2014-07-15 NOTE — Addendum Note (Signed)
Addended by: Danie Binder on: 07/15/2014 11:52 AM   Modules accepted: Orders

## 2014-07-15 NOTE — Telephone Encounter (Signed)
PLEASE CALL PT'S FAMILY. RX FOR PROTONIX AND MEVACOR SENT.

## 2014-07-15 NOTE — Telephone Encounter (Signed)
PATIENT PCP IS NO LONGER WORKING AND SHE CAN NOT SEE A NEW ONE UNTIL END OF April AND IS RUNNING AOUT OF REFLUX MEDS.  PLEASE CALL PATIENT GUARDIAN EVETTE Teal AT [phone]

## 2014-07-15 NOTE — Telephone Encounter (Signed)
[name] is aware Rx's sent.

## 2014-07-20 ENCOUNTER — Telehealth: Payer: Self-pay | Admitting: Obstetrics & Gynecology

## 2014-07-20 MED ORDER — MEGESTROL ACETATE 40 MG PO TABS: ORAL_TABLET | ORAL | Status: DC

## 2014-07-20 NOTE — Telephone Encounter (Signed)
Pt informed to take Megace 40 mg 2 tablets po daily. Pt states she will need Rx sent to the pharmacy.

## 2014-07-20 NOTE — Telephone Encounter (Signed)
Pt states she continues to have vaginal bleeding with the Megace 40 mg. Pt wants to know if the dosage can be increased?

## 2014-07-20 NOTE — Telephone Encounter (Signed)
Yes Increase to 2 tabs a day and we will see what happens

## 2014-08-17 ENCOUNTER — Other Ambulatory Visit: Payer: Self-pay | Admitting: Family Medicine

## 2014-08-18 ENCOUNTER — Ambulatory Visit (INDEPENDENT_AMBULATORY_CARE_PROVIDER_SITE_OTHER): Payer: Medicare Other | Admitting: Gastroenterology

## 2014-08-18 ENCOUNTER — Encounter: Payer: Self-pay | Admitting: Gastroenterology

## 2014-08-18 VITALS — BP 160/86 | HR 79 | Temp 98.4°F | Ht 62.0 in | Wt 125.2 lb

## 2014-08-18 DIAGNOSIS — D126 Benign neoplasm of colon, unspecified: Secondary | ICD-10-CM

## 2014-08-18 DIAGNOSIS — K219 Gastro-esophageal reflux disease without esophagitis: Secondary | ICD-10-CM | POA: Diagnosis not present

## 2014-08-18 DIAGNOSIS — E785 Hyperlipidemia, unspecified: Secondary | ICD-10-CM | POA: Diagnosis not present

## 2014-08-18 DIAGNOSIS — K5901 Slow transit constipation: Secondary | ICD-10-CM

## 2014-08-18 DIAGNOSIS — R1310 Dysphagia, unspecified: Secondary | ICD-10-CM | POA: Diagnosis not present

## 2014-08-18 MED ORDER — PANTOPRAZOLE SODIUM 40 MG PO TBEC: DELAYED_RELEASE_TABLET | ORAL | Status: DC

## 2014-08-18 MED ORDER — LISINOPRIL 2.5 MG PO TABS: 2.5000 mg | ORAL_TABLET | Freq: Every day | ORAL | Status: DC

## 2014-08-18 MED ORDER — PRAVASTATIN SODIUM 40 MG PO TABS: 40.0000 mg | ORAL_TABLET | Freq: Every day | ORAL | Status: DC

## 2014-08-18 NOTE — Assessment & Plan Note (Signed)
SYMPTOMS FAIRLY WELL CONTROLLED.  CONTINUE TO MONITOR SYMPTOMS. FOLLOW UP IN 6 MOS.  

## 2014-08-18 NOTE — Assessment & Plan Note (Signed)
SYMPTOMS FAIRLY WELL CONTROLLED WITH DIET.  CONTINUE TO MONITOR SYMPTOMS. FOLLOW UP IN 6 MOS.

## 2014-08-18 NOTE — Progress Notes (Signed)
   Subjective:    Patient ID: [name], female    DOB: Feb 21, 1966, 49 y.o.   MRN: [id number]  Redge Gainer, MD  HPI WAS CONSTIPATED AND NOW TRYING TO EAT MORE FRUITS AND VEGETABLES. SWALLOWING A LITTLE BETTER. REALLY PICKY EATER. HEARTBURN: 2-3X/WEEK IN THE AM IT HURTS. PROBLEMS SEDATIONS-HAS TO HAVE ENDOSCOPY AT [facility].  PT DENIES FEVER, CHILLS, HEMATOCHEZIA, HEMATEMESIS, nausea, vomiting, melena, diarrhea, CHEST PAIN, SHORTNESS OF BREATH,  CHANGE IN BOWEL IN HABITS, OR abdominal pain.    Past Medical History  Diagnosis Date  . GERD (gastroesophageal reflux disease) 2007    BPE NL ESO, REFLUX  . Dysphagia 2003    2O to GERD/HH, ?difficulty with large food bolus due to neuromuscular weakness  . Constipation - functional   . Hiatal hernia   . HTN (hypertension)   . Cerebral palsy   . Epigastric pain MAY 2012 ABD [place] NL GBLIVPAN    2o to GERD V. NON-ULCER DYSPEPSIA  . Hyperlipidemia   . Complication of anesthesia 12/09/2013    Respiratory instability with 10 mg Propofol.Poor gag reflex.   Past Surgical History  Procedure Laterality Date  . Colonoscopy  APR 2009 with proprofol    SLF: SIMPLE ADENOMAS  . Upper gastrointestinal endoscopy  MAY 2012 DIL 16 mm    SLF: NO DEFINITE STRICTURE APPRECIATED  . Upper gastrointestinal endoscopy  FEB 2009-HYPOXIA REQUIRING NARCAN, & VERSED, D50V4    SLF: NL ESO, FUNDIC GLAND POLYPS  . Upper gastrointestinal endoscopy  w/ DIL 2003, 2004, 2006    Review of Systems     Objective:   Physical Exam  Constitutional: She is oriented to person, place, and time. She appears well-developed and well-nourished. No distress.  HENT:  Head: Normocephalic and atraumatic.  Mouth/Throat: Oropharynx is clear and moist. No oropharyngeal exudate.  Eyes: Pupils are equal, round, and reactive to light. No scleral icterus.  Neck: Normal range of motion. Neck supple.  Cardiovascular: Normal rate, regular rhythm and normal heart sounds.   Pulmonary/Chest:  Effort normal and breath sounds normal. No respiratory distress.  Abdominal: Soft. Bowel sounds are normal. She exhibits no distension. There is no tenderness.  EXAM LIMITED-PT IN [facility]   Musculoskeletal: She exhibits no edema.  Lymphadenopathy:    She has no cervical adenopathy.  Neurological: She is alert and oriented to person, place, and time.  NO  NEW FOCAL DEFICITS   Psychiatric: She has a normal mood and affect.  Vitals reviewed.         Assessment & Plan:

## 2014-08-18 NOTE — Assessment & Plan Note (Signed)
SYMPTOMS FAIRLY WELL CONTROLLED.  CONTINUE PROTONIX. TAKE 30 MINUTES PRIOR TO MEALS TWICE DAILY. LOW fat diet FOLLOW UP IN 6 MOS.

## 2014-08-18 NOTE — Progress Notes (Signed)
ON RECALL LIST  °

## 2014-08-18 NOTE — Assessment & Plan Note (Signed)
HIGH RISK-MOTHER HAD COLON CA  NEXT TCS AT [facility] IN 2021 DUE TO HIGH RISK FOR ANESTHESIA

## 2014-08-18 NOTE — Progress Notes (Signed)
cc'ed to pcp °

## 2014-08-18 NOTE — Patient Instructions (Signed)
FOLLOW A HIGH FIBER/LOW FAT DIET. SEE INFO BELOW.  CONTINUE PROTONIX. TAKE 30 MINUTES PRIOR TO MEALS TWICE DAILY.  PLEASE CALL WITH QUESTIONS OR CONCERNS RE: SWALLOWING.  FOLLOW UP IN 6 MOS.

## 2014-08-18 NOTE — Assessment & Plan Note (Signed)
NEEDS MEDS REFILLED.

## 2014-08-21 ENCOUNTER — Other Ambulatory Visit: Payer: Self-pay | Admitting: Gastroenterology

## 2014-08-21 ENCOUNTER — Other Ambulatory Visit: Payer: Self-pay | Admitting: Family Medicine

## 2014-08-26 ENCOUNTER — Telehealth: Payer: Self-pay | Admitting: Obstetrics & Gynecology

## 2014-08-26 NOTE — Telephone Encounter (Signed)
[name], [place] caregiver, states pt continues to have vaginal bleeding with Megace 40 mg 2 daily and at night the bleeding is heavier. Please advise.

## 2014-08-29 NOTE — Telephone Encounter (Signed)
Left message x 1. JSY 

## 2014-08-29 NOTE — Telephone Encounter (Signed)
Increase to 3 tablets (take all 3 at once) daily

## 2014-08-30 NOTE — Telephone Encounter (Signed)
Left message x 2. JSY 

## 2014-08-31 ENCOUNTER — Telehealth: Payer: Self-pay | Admitting: Obstetrics & Gynecology

## 2014-08-31 NOTE — Telephone Encounter (Signed)
I already replied to this message and stated to increase to 3 per day

## 2014-08-31 NOTE — Telephone Encounter (Signed)
[name], pt caregiver, states [name] 80 mg daily, but continues to have vaginal bleeding. This pt is not ambulatory.

## 2014-09-01 NOTE — Telephone Encounter (Signed)
[name], pt caregiver informed to increase Megace to 3 x daily per Dr. Elonda Husky.

## 2014-09-01 NOTE — Telephone Encounter (Addendum)
Megace 40 mg po 3 tablets daily #90, [facility] [place] [facility] Dr. Elonda Husky.

## 2014-10-05 ENCOUNTER — Telehealth: Payer: Self-pay | Admitting: General Practice

## 2014-10-05 NOTE — Telephone Encounter (Signed)
[facility] and [facility] [place] and he ran the RX through . Pt is aware.

## 2014-10-05 NOTE — Telephone Encounter (Signed)
Patient's aide called in and wanted [place] to [facility], because they never received the Rx for Pravastatin 40mg .  Routing to [place] for f/u.

## 2014-10-19 ENCOUNTER — Ambulatory Visit: Payer: Medicare Other | Admitting: *Deleted

## 2014-11-24 ENCOUNTER — Other Ambulatory Visit: Payer: Self-pay | Admitting: Obstetrics & Gynecology

## 2014-12-26 ENCOUNTER — Ambulatory Visit (INDEPENDENT_AMBULATORY_CARE_PROVIDER_SITE_OTHER): Payer: Medicare Other | Admitting: Family Medicine

## 2014-12-26 ENCOUNTER — Encounter (INDEPENDENT_AMBULATORY_CARE_PROVIDER_SITE_OTHER): Payer: Self-pay

## 2014-12-26 ENCOUNTER — Encounter: Payer: Self-pay | Admitting: Family Medicine

## 2014-12-26 VITALS — BP 153/93 | HR 80 | Temp 98.1°F | Ht 62.0 in

## 2014-12-26 DIAGNOSIS — I10 Essential (primary) hypertension: Secondary | ICD-10-CM | POA: Diagnosis not present

## 2014-12-26 DIAGNOSIS — E559 Vitamin D deficiency, unspecified: Secondary | ICD-10-CM | POA: Diagnosis not present

## 2014-12-26 DIAGNOSIS — E785 Hyperlipidemia, unspecified: Secondary | ICD-10-CM | POA: Diagnosis not present

## 2014-12-26 DIAGNOSIS — G44219 Episodic tension-type headache, not intractable: Secondary | ICD-10-CM

## 2014-12-26 DIAGNOSIS — Z0000 Encounter for general adult medical examination without abnormal findings: Secondary | ICD-10-CM | POA: Insufficient documentation

## 2014-12-26 DIAGNOSIS — K5901 Slow transit constipation: Secondary | ICD-10-CM

## 2014-12-26 DIAGNOSIS — R51 Headache: Secondary | ICD-10-CM

## 2014-12-26 DIAGNOSIS — R519 Headache, unspecified: Secondary | ICD-10-CM | POA: Insufficient documentation

## 2014-12-26 MED ORDER — LISINOPRIL 10 MG PO TABS: 5.0000 mg | ORAL_TABLET | Freq: Every day | ORAL | Status: DC

## 2014-12-26 NOTE — Assessment & Plan Note (Signed)
Pap up-to-date, sees GYN HCV, HIV

## 2014-12-26 NOTE — Assessment & Plan Note (Signed)
Recheck labs, continue multivitamin

## 2014-12-26 NOTE — Assessment & Plan Note (Signed)
Elevated Increase lisinopril from 2.5 to 10 mg Recheck 4-6 weeks Labs

## 2014-12-26 NOTE — Assessment & Plan Note (Signed)
Tension type HA, Recommended tylenol, nsaids  No red flags for serious neurologic etiology With contractures and CP she may benefit from botox injections for muscle tightness, would consider PM&R referral.

## 2014-12-26 NOTE — Assessment & Plan Note (Signed)
Previously at goal Continue pravastatin Check labs

## 2014-12-26 NOTE — Progress Notes (Signed)
Patient ID: [name], female   DOB: 1966-02-22, 49 y.o.   MRN: [id number]   HPI  Patient presents today for follow up chronic medical probnlems  HTN Taking meds, + chest pain, occasionaly first thing in the morning described as central chest stiffness that improves with movement, no exertional symptoms No dyspnea, palpitations, leg edema  Constipation Stools approx ponce daily, a few days a week she lingers at stool Her caretaker, Kennyth Lose, is here and feels she is too anxious about her stools No abd pain, vomiting, nasuea Uses miralax a few times weekly  Periods- heavy and irregular On 120 mg megace for control and having intermittent breakthrough.  Asked her to Call GYN for this concern No Hx of pathology, just stopped due to heaviness and difficulty with CP  Headaches 2-3 times per week, Cap like dist HA that lasts 30 min to 3 hours.  No meds tried No aggravating/alleviating factors No Weakness, numbness, tingling, or vision changes   PMH: Smoking status noted ROS: Per HPI  Objective: BP 153/93 mmHg  Pulse 80  Temp(Src) 98.1 F (36.7 C) (Oral)  Ht 5' 2"  (1.575 m)  Wt  Gen: NAD, alert, cooperative with exam HEENT: NCAT, nares clear, oropharynx clear, TMs WNL BL Neck: Trachea midline, no thyromegaly CV: RRR, good S1/S2, no murmur Resp: CTABL, no wheezes, non-labored Abd: SNTND, BS present, no guarding or organomegaly Ext: No edema, warm Neuro: Alert and oriented, speech difficulties understand, assisted by her caretaker Kennyth Lose. Sitting in wheelchair  Musculoskeletal: Contractures of left and right hand, limited use of her left arm with apparent contracture of her upper arm and decreased flexion.  Assessment and plan:  Vitamin D deficiency Recheck labs, continue multivitamin  HTN (hypertension) Elevated Increase lisinopril from 2.5 to 10 mg Recheck 4-6 weeks Labs   HLD (hyperlipidemia) Previously at goal Continue pravastatin Check labs  Healthcare  maintenance Pap up-to-date, sees GYN HCV, HIV   Constipation Currently doing well, discussed Continue miralax  Headache Tension type HA, Recommended tylenol, nsaids  No red flags for serious neurologic etiology With contractures and CP she may benefit from botox injections for muscle tightness, would consider PM&R referral.     Orders Placed This Encounter  Procedures  . CMP14+EGFR  . TSH  . CBC with Differential  . Vit D  25 hydroxy (rtn osteoporosis monitoring)  . Lipid panel  . Hepatitis C antibody  . HIV antibody    Meds ordered this encounter  Medications  . ACETAMINOPHEN-CODEINE #3 PO    Sig: Take by mouth.  Marland Kitchen lisinopril (PRINIVIL,ZESTRIL) 10 MG tablet    Sig: Take 0.5 tablets (5 mg total) by mouth daily.    Dispense:  90 tablet    Refill:  Craig, MD [place] 12/26/2014, 3:10 PM

## 2014-12-26 NOTE — Patient Instructions (Signed)
Great to meet you guys!  Please call Dr. Brynda Greathouse office for advice about the Megace  We will call about your labs  If her headaches become more bothersome please come back If her chest pain changes and does not change with position or if it worsens please seek medial care

## 2014-12-26 NOTE — Assessment & Plan Note (Signed)
Currently doing well, discussed Continue miralax

## 2014-12-27 LAB — CMP14+EGFR
ALT: 21 IU/L (ref 0–32)
AST: 16 IU/L (ref 0–40)
Albumin/Globulin Ratio: 1.4 (ref 1.1–2.5)
Albumin: 4.4 g/dL (ref 3.5–5.5)
Alkaline Phosphatase: 83 IU/L (ref 39–117)
BUN/Creatinine Ratio: 25 — ABNORMAL HIGH (ref 9–23)
BUN: 13 mg/dL (ref 6–24)
Bilirubin Total: 0.2 mg/dL (ref 0.0–1.2)
CALCIUM: 9.6 mg/dL (ref 8.7–10.2)
CO2: 21 mmol/L (ref 18–29)
Chloride: 102 mmol/L (ref 97–108)
Creatinine, Ser: 0.53 mg/dL — ABNORMAL LOW (ref 0.57–1.00)
GFR calc non Af Amer: 110 mL/min/{1.73_m2} (ref >59)
GFR, EST AFRICAN AMERICAN: 127 mL/min/{1.73_m2} (ref >59)
Globulin, Total: 3.2 g/dL (ref 1.5–4.5)
Glucose: 87 mg/dL (ref 65–99)
Potassium: 4.2 mmol/L (ref 3.5–5.2)
Sodium: 142 mmol/L (ref 134–144)
TOTAL PROTEIN: 7.6 g/dL (ref 6.0–8.5)

## 2014-12-27 LAB — CBC WITH DIFFERENTIAL/PLATELET
BASOS: 1 %
Basophils Absolute: 0 10*3/uL (ref 0.0–0.2)
EOS (ABSOLUTE): 0.1 10*3/uL (ref 0.0–0.4)
EOS: 2 %
HEMATOCRIT: 39.9 % (ref 34.0–46.6)
Hemoglobin: 13.5 g/dL (ref 11.1–15.9)
IMMATURE GRANS (ABS): 0 10*3/uL (ref 0.0–0.1)
IMMATURE GRANULOCYTES: 0 %
LYMPHS: 35 %
Lymphocytes Absolute: 2.7 10*3/uL (ref 0.7–3.1)
MCH: 30.5 pg (ref 26.6–33.0)
MCHC: 33.8 g/dL (ref 31.5–35.7)
MCV: 90 fL (ref 79–97)
Monocytes Absolute: 0.5 10*3/uL (ref 0.1–0.9)
Monocytes: 6 %
NEUTROS PCT: 56 %
Neutrophils Absolute: 4.5 10*3/uL (ref 1.4–7.0)
PLATELETS: 327 10*3/uL (ref 150–379)
RBC: 4.43 x10E6/uL (ref 3.77–5.28)
RDW: 13.9 % (ref 12.3–15.4)
WBC: 7.8 10*3/uL (ref 3.4–10.8)

## 2014-12-27 LAB — VITAMIN D 25 HYDROXY (VIT D DEFICIENCY, FRACTURES): Vit D, 25-Hydroxy: 25.8 ng/mL — ABNORMAL LOW (ref 30.0–100.0)

## 2014-12-27 LAB — LIPID PANEL
CHOLESTEROL TOTAL: 159 mg/dL (ref 100–199)
Chol/HDL Ratio: 2.8 ratio units (ref 0.0–4.4)
HDL: 57 mg/dL (ref >39)
LDL Calculated: 89 mg/dL (ref 0–99)
TRIGLYCERIDES: 63 mg/dL (ref 0–149)
VLDL Cholesterol Cal: 13 mg/dL (ref 5–40)

## 2014-12-27 LAB — TSH: TSH: 2.31 u[IU]/mL (ref 0.450–4.500)

## 2014-12-27 LAB — HEPATITIS C ANTIBODY: Hep C Virus Ab: <0.1 s/co ratio (ref 0.0–0.9)

## 2014-12-27 LAB — HIV ANTIBODY (ROUTINE TESTING W REFLEX): HIV Screen 4th Generation wRfx: NONREACTIVE

## 2014-12-28 ENCOUNTER — Telehealth: Payer: Self-pay | Admitting: Family Medicine

## 2014-12-28 NOTE — Telephone Encounter (Signed)
Will ask nursing to call and address labs.   They look very good, but she has slightly low vitamin D. She should take around 100 units daily and we can recheck on her next blood draw.   Laroy Apple, MD [facility] 12/28/2014, 7:46 AM

## 2015-01-18 ENCOUNTER — Encounter: Payer: Self-pay | Admitting: Gastroenterology

## 2015-02-06 ENCOUNTER — Encounter: Payer: Self-pay | Admitting: Family Medicine

## 2015-02-06 ENCOUNTER — Ambulatory Visit (INDEPENDENT_AMBULATORY_CARE_PROVIDER_SITE_OTHER): Payer: Medicare Other | Admitting: Family Medicine

## 2015-02-06 ENCOUNTER — Encounter (INDEPENDENT_AMBULATORY_CARE_PROVIDER_SITE_OTHER): Payer: Self-pay

## 2015-02-06 VITALS — BP 139/97 | HR 91 | Temp 97.8°F

## 2015-02-06 DIAGNOSIS — E559 Vitamin D deficiency, unspecified: Secondary | ICD-10-CM

## 2015-02-06 DIAGNOSIS — I10 Essential (primary) hypertension: Secondary | ICD-10-CM

## 2015-02-06 DIAGNOSIS — Z23 Encounter for immunization: Secondary | ICD-10-CM

## 2015-02-06 DIAGNOSIS — G44219 Episodic tension-type headache, not intractable: Secondary | ICD-10-CM

## 2015-02-06 DIAGNOSIS — Z0000 Encounter for general adult medical examination without abnormal findings: Secondary | ICD-10-CM

## 2015-02-06 MED ORDER — IBUPROFEN 400 MG PO TABS: ORAL_TABLET | ORAL | Status: DC

## 2015-02-06 MED ORDER — ASPIRIN 81 MG PO TABS: 81.0000 mg | ORAL_TABLET | Freq: Every day | ORAL | Status: DC

## 2015-02-06 NOTE — Patient Instructions (Addendum)
Great to see you!  You should be taking 10 mg of lisinopril, your blood pressure looks better.   Come back in 6 months for routine follow up.   If she has a dehydrating illness (like stomach flu) she should stop lisinopril for 3 days.

## 2015-02-06 NOTE — Progress Notes (Signed)
   HPI  Patient presents today for follow-up of hypertension  Patient explains that she started taking lisinopril 10 mg approximately 2-3 weeks ago. Not checking blood pressure at home Chest pain has not changed, described as chest stiffness in the early a.m. - nonexertional, and reproducible to palpation on exam  Headaches-unchanged, tension-type No dyspnea or leg edema  PMH: Smoking status noted ROS: Per HPI  Objective: BP 139/97 mmHg  Pulse 91  Temp(Src) 97.8 F (36.6 C) (Oral)  Ht   Wt  Gen: NAD, alert, cooperative with exam, wheelchair bound with cerebral palsy HEENT: NCAT CV: RRR, good S1/S2, no murmur Resp: CTABL, no wheezes, non-labored Ext: No edema, warm Neuro: Alert and oriented, No gross deficits  Assessment and plan:  # Hypertension Improved, continue lisinopril 10 mg Recheck BMP  # Vitamin D D deficiency Taking daily vitamin D, recheck in 6 months whenever she follows up  # Headache Tension type, refill Advil  HCM_ Flu shot today   Orders Placed This Encounter  Procedures  . Flu Vaccine QUAD 36+ mos IM  . BMP8+EGFR    Meds ordered this encounter  Medications  . lisinopril (PRINIVIL,ZESTRIL) 2.5 MG tablet    Sig: TAKE 1 TABLET (2.5 MG TOTAL) BY MOUTH DAILY.    Refill:  3  . ibuprofen (ADVIL,MOTRIN) 400 MG tablet    Sig: TAKE 1 TABLET (600 MG TOTAL) BY MOUTH EVERY 8 (EIGHT) HOURS AS NEEDED FOR PAIN (TAKE WITH MEALS).    Dispense:  30 tablet    Refill:  3    Needs to be seen before next refill  . aspirin 81 MG tablet    Sig: Take 1 tablet (81 mg total) by mouth daily.    Dispense:  30 tablet    Refill:  [place], MD [facility] 02/06/2015, 2:27 PM

## 2015-02-07 LAB — BMP8+EGFR
BUN / CREAT RATIO: 20 (ref 9–23)
BUN: 14 mg/dL (ref 6–24)
CO2: 23 mmol/L (ref 18–29)
CREATININE: 0.7 mg/dL (ref 0.57–1.00)
Calcium: 9.3 mg/dL (ref 8.7–10.2)
Chloride: 105 mmol/L (ref 97–108)
GFR, EST AFRICAN AMERICAN: 116 mL/min/{1.73_m2} (ref >59)
GFR, EST NON AFRICAN AMERICAN: 101 mL/min/{1.73_m2} (ref >59)
Glucose: 128 mg/dL — ABNORMAL HIGH (ref 65–99)
Potassium: 3.8 mmol/L (ref 3.5–5.2)
Sodium: 143 mmol/L (ref 134–144)

## 2015-03-02 ENCOUNTER — Other Ambulatory Visit: Payer: Self-pay | Admitting: Family Medicine

## 2015-03-02 MED ORDER — LISINOPRIL 10 MG PO TABS: 10.0000 mg | ORAL_TABLET | Freq: Every day | ORAL | Status: DC

## 2015-03-22 ENCOUNTER — Other Ambulatory Visit: Payer: Self-pay | Admitting: Obstetrics & Gynecology

## 2015-04-20 ENCOUNTER — Other Ambulatory Visit: Payer: Self-pay

## 2015-04-21 MED ORDER — PANTOPRAZOLE SODIUM 40 MG PO TBEC: DELAYED_RELEASE_TABLET | ORAL | Status: DC

## 2015-06-28 ENCOUNTER — Other Ambulatory Visit: Payer: Self-pay | Admitting: Obstetrics & Gynecology

## 2015-07-13 ENCOUNTER — Encounter: Payer: Self-pay | Admitting: Gastroenterology

## 2015-07-13 ENCOUNTER — Ambulatory Visit (INDEPENDENT_AMBULATORY_CARE_PROVIDER_SITE_OTHER): Payer: Medicare Other | Admitting: Gastroenterology

## 2015-07-13 VITALS — BP 180/113 | HR 89 | Temp 98.0°F | Ht 62.0 in | Wt 124.8 lb

## 2015-07-13 DIAGNOSIS — K5901 Slow transit constipation: Secondary | ICD-10-CM | POA: Diagnosis not present

## 2015-07-13 DIAGNOSIS — K219 Gastro-esophageal reflux disease without esophagitis: Secondary | ICD-10-CM | POA: Diagnosis not present

## 2015-07-13 DIAGNOSIS — R1310 Dysphagia, unspecified: Secondary | ICD-10-CM

## 2015-07-13 MED ORDER — POLYETHYLENE GLYCOL 3350 17 GM/SCOOP PO POWD: ORAL | Status: DC

## 2015-07-13 NOTE — Assessment & Plan Note (Signed)
SYMPTOMS CONTROLLED/RESOLVED.  CONTINUE TO MONITOR SYMPTOMS. 

## 2015-07-13 NOTE — Assessment & Plan Note (Addendum)
SYMPTOMS FAIRLY WELL CONTROLLED.  CONTINUE TO MONITOR SYMPTOMS. AVOID TRIGGERS FOR REFLUX.  HANDOUT GIVEN. CONTINUE PROTONIX. TAKE 30 MINUTES PRIOR TO MEALS TWICE DAILY. FOLLOW UP IN 6 MOS.

## 2015-07-13 NOTE — Patient Instructions (Addendum)
DRINK WATER TO KEEP YOUR URINE LIGHT YELLOW.  FOLLOW A HIGH FIBER DIET. AVOID ITEMS THAT CAUSE BLOATING & GAS.   USE MIRALAX 1 SCOOP DAILY IN 8 OZ OF LIQUID. INCREASE TO TWICE DAILY IF YOU ARE NOT HAVING A BM 3-4 TIMES A WEEK. HOD MIRALAX IF YO HAVE DIARRHEA OR BLOATING.  AVOID TRIGGERS FOR REFLUX. SEE INFO BELOW.  CONTINUE PROTONIX. TAKE 30 MINUTES PRIOR TO MEALS TWICE DAILY.  FOLLOW UP IN 6 MOS.     Lifestyle and home remedies TO CONTROL REFLUX You may eliminate or reduce the frequency of heartburn by making the following lifestyle changes:  . Control your weight. Being overweight is a major risk factor for heartburn and GERD. Excess pounds put pressure on your abdomen, pushing up your stomach and causing acid to back up into your esophagus.   . Eat smaller meals. 4 TO 6 MEALS A DAY. This reduces pressure on the lower esophageal sphincter, helping to prevent the valve from opening and acid from washing back into your esophagus.   Dolphus Jenny your belt. Clothes that fit tightly around your waist put pressure on your abdomen and the lower esophageal sphincter.  .  . Eliminate heartburn triggers. Everyone has specific triggers.Common triggers such as fatty or fried foods, spicy food, tomato sauce, carbonated beverages, alcohol, chocolate, mint, garlic, onion, caffeine and nicotine may make heartburn worse.   Marland Kitchen Avoid stooping or bending. Tying your shoes is OK. Bending over for longer periods to weed your garden isn't, especially soon after eating.   . Don't lie down after a meal. Wait at least three to four hours after eating before going to bed, and don't lie down right after eating.   Alternative medicine . Several home remedies exist for treating GERD, but they provide only temporary relief. They include drinking baking soda (sodium bicarbonate) added to water or drinking other fluids such as baking soda mixed with cream of tartar and water. . Although these liquids create temporary  relief by neutralizing, washing away or buffering acids, eventually they aggravate the situation by adding gas and fluid to your stomach, increasing pressure and causing more acid reflux. Further, adding more sodium to your diet may increase your blood pressure and add stress to your heart, and excessive bicarbonate ingestion can alter the acid-base balance in your body.

## 2015-07-13 NOTE — Assessment & Plan Note (Addendum)
SYMPTOMS FAIRLY WELL CONTROLLED WITH MIRALAX.  DRINK WATER TO KEEP YOUR URINE LIGHT YELLOW. FOLLOW A HIGH FIBER DIET. AVOID ITEMS THAT CAUSE BLOATING & GAS.  USE MIRALAX 1 SCOOP DAILY IN 8 OZ OF LIQUID. INCREASE TO TWICE DAILY IF YOU ARE NOT HAVING A BM 3-4 TIMES A WEEK. MED SIDE EFFECTS DISCUSSED. FOLLOW UP IN 6 MOS.

## 2015-07-13 NOTE — Progress Notes (Signed)
   Subjective:    Patient ID: [name], female    DOB: [birth year], 50 y.o.   MRN: [id number]  Kenn File, MD  HPI SWALLOWING GOOD. BMS; QOD. NEEDS MORE MEDICINE. NAUSEA OFF AND ON. CHEST/ABDOMINAL PAIN: SOMETIMES.   PT DENIES FEVER, CHILLS, HEMATOCHEZIA, HEMATEMESIS, vomiting, melena, diarrhea, CHEST PAIN, SHORTNESS OF BREATH,  CHANGE IN BOWEL IN HABITS,  problems swallowing, OR heartburn or indigestion.   Past Medical History  Diagnosis Date  . GERD (gastroesophageal reflux disease) 2007    BPE NL ESO, REFLUX  . Dysphagia 2003    2O to GERD/HH, ?difficulty with large food bolus due to neuromuscular weakness  . Constipation - functional   . Hiatal hernia   . HTN (hypertension)   . Cerebral palsy   . Epigastric pain MAY 2012 ABD [place] NL GBLIVPAN    2o to GERD V. NON-ULCER DYSPEPSIA  . Hyperlipidemia   . Complication of anesthesia 12/09/2013    Respiratory instability with 10 mg Propofol.Poor gag reflex.  Marland Kitchen HIATAL HERNIA 11/23/2008    Qualifier: Diagnosis of  By: Zeb Comfort      Past Surgical History  Procedure Laterality Date  . Colonoscopy  APR 2009 with proprofol    SLF: SIMPLE ADENOMAS  . Upper gastrointestinal endoscopy  MAY 2012 DIL 16 mm    SLF: NO DEFINITE STRICTURE APPRECIATED  . Upper gastrointestinal endoscopy  FEB 2009-HYPOXIA REQUIRING NARCAN, & VERSED, D50V4    SLF: NL ESO, FUNDIC GLAND POLYPS  . Upper gastrointestinal endoscopy  w/ DIL 2003, 2004, 2006    Allergies  Allergen Reactions  . Hydrocodone     Vomiting and hallunications   Review of Systems PER HPI OTHERWISE ALL SYSTEMS ARE NEGATIVE.     Objective:   Physical Exam  Constitutional: She is oriented to person, place, and time. She appears well-developed and well-nourished. No distress.  HENT:  Head: Normocephalic and atraumatic.  Mouth/Throat: Oropharynx is clear and moist. No oropharyngeal exudate.  Eyes: Pupils are equal, round, and reactive to light. No scleral icterus.  Neck:  Normal range of motion. Neck supple.  Cardiovascular: Normal rate, regular rhythm and normal heart sounds.   Pulmonary/Chest: Effort normal and breath sounds normal. No respiratory distress.  Abdominal: Soft. Bowel sounds are normal. She exhibits no distension. There is no tenderness.  EXAM LIMITED-PT IN wheelCHAIR   Musculoskeletal: She exhibits no edema.  Lymphadenopathy:    She has no cervical adenopathy.  Neurological: She is alert and oriented to person, place, and time.  NO  NEW FOCAL DEFICITS  Psychiatric: She has a normal mood and affect.  Vitals reviewed.     Assessment & Plan:

## 2015-07-13 NOTE — Progress Notes (Signed)
cc'ed to pcp °

## 2015-07-13 NOTE — Progress Notes (Signed)
ON RECALL  °

## 2015-08-07 ENCOUNTER — Ambulatory Visit: Payer: Medicare Other | Admitting: Family Medicine

## 2015-08-09 ENCOUNTER — Ambulatory Visit (INDEPENDENT_AMBULATORY_CARE_PROVIDER_SITE_OTHER): Payer: Medicare Other | Admitting: Family Medicine

## 2015-08-09 ENCOUNTER — Encounter (INDEPENDENT_AMBULATORY_CARE_PROVIDER_SITE_OTHER): Payer: Self-pay

## 2015-08-09 ENCOUNTER — Encounter: Payer: Self-pay | Admitting: Family Medicine

## 2015-08-09 VITALS — BP 158/93 | HR 84 | Temp 98.3°F | Ht 62.0 in

## 2015-08-09 DIAGNOSIS — M25571 Pain in right ankle and joints of right foot: Secondary | ICD-10-CM

## 2015-08-09 DIAGNOSIS — I10 Essential (primary) hypertension: Secondary | ICD-10-CM

## 2015-08-09 NOTE — Progress Notes (Signed)
   HPI  Patient presents today here for blood pressure check.  Patient's feeling very well. She has some issues with left-sided costochondritis which is continuing every once in a while. She also has some right ankle pain today No recent injury, states it only hurts her whenever she stands up.  She seen with her sister who lives with her.  She denies any difficulty breathing, palpitations, or leg edema.  She's in good spirits. Good medication compliance, her average blood pressure at home is 130-140 over 80s  PMH: Smoking status noted ROS: Per HPI  Objective: BP 158/93 mmHg  Pulse 84  Temp(Src) 98.3 F (36.8 C) (Oral)  Ht 5\' 2"  (1.575 m)  Wt  Gen: NAD, alert, cooperative with exam HEENT: NCAT CV: RRR, good S1/S2, no murmur Resp: CTABL, no wheezes, non-labored Ext: No edema, warm Neuro: Alert and oriented, No gross deficits  Right ankle with mild tenderness to palpation over the distal ligaments around the lateral malleolus  Assessment and plan:  # HTN Not improved with rest Discussed possible increase, however she sounds controlled at home BP log, f/u 1 month Consider increasing lisinopril or adding HCTZ  # Ankle pain Mild, reassurance provided Supportive care RTC with any worsening   Laroy Apple, MD [facility] 08/09/2015, 4:01 PM

## 2015-08-09 NOTE — Patient Instructions (Signed)
Great to see you!  Come back in 1 month, try to keep the blood pressure log for me  We will consider increasing your medicine if the reading are elevated

## 2015-08-16 ENCOUNTER — Encounter: Payer: Self-pay | Admitting: *Deleted

## 2015-09-01 ENCOUNTER — Other Ambulatory Visit: Payer: Self-pay | Admitting: Gastroenterology

## 2015-09-01 DIAGNOSIS — E785 Hyperlipidemia, unspecified: Secondary | ICD-10-CM

## 2015-09-01 NOTE — Telephone Encounter (Signed)
She needs to get her cholesterol medication from her PCP. Please make sure patient is aware and PCP is aware. I think we refilled for her once because she was completely out and then it has just carried through with multiple refills.

## 2015-09-04 NOTE — Telephone Encounter (Signed)
I called and left a Vm for pt. Told them to get the cholesterol medication from PCP and to call if they have questions. Routing to PCP to take care of the refills for Pravastatin.

## 2015-09-05 MED ORDER — PRAVASTATIN SODIUM 40 MG PO TABS: 40.0000 mg | ORAL_TABLET | Freq: Every day | ORAL | Status: DC

## 2015-09-05 NOTE — Telephone Encounter (Signed)
Patient informed. 

## 2015-09-11 ENCOUNTER — Ambulatory Visit: Payer: Medicare Other | Admitting: Family Medicine

## 2015-09-22 ENCOUNTER — Other Ambulatory Visit: Payer: Self-pay | Admitting: Gastroenterology

## 2015-11-12 ENCOUNTER — Other Ambulatory Visit: Payer: Self-pay | Admitting: Obstetrics & Gynecology

## 2015-12-12 ENCOUNTER — Encounter: Payer: Self-pay | Admitting: Gastroenterology

## 2016-02-01 ENCOUNTER — Ambulatory Visit (INDEPENDENT_AMBULATORY_CARE_PROVIDER_SITE_OTHER): Payer: Medicare Other | Admitting: Family Medicine

## 2016-02-01 ENCOUNTER — Encounter: Payer: Self-pay | Admitting: Family Medicine

## 2016-02-01 VITALS — BP 160/100 | HR 90 | Temp 98.7°F | Ht 62.0 in

## 2016-02-01 DIAGNOSIS — J301 Allergic rhinitis due to pollen: Secondary | ICD-10-CM | POA: Diagnosis not present

## 2016-02-01 DIAGNOSIS — K219 Gastro-esophageal reflux disease without esophagitis: Secondary | ICD-10-CM

## 2016-02-01 DIAGNOSIS — E785 Hyperlipidemia, unspecified: Secondary | ICD-10-CM

## 2016-02-01 DIAGNOSIS — Z23 Encounter for immunization: Secondary | ICD-10-CM | POA: Diagnosis not present

## 2016-02-01 DIAGNOSIS — I10 Essential (primary) hypertension: Secondary | ICD-10-CM

## 2016-02-01 MED ORDER — CETIRIZINE HCL 10 MG PO TABS: 10.0000 mg | ORAL_TABLET | Freq: Every day | ORAL | 0 refills | Status: DC

## 2016-02-01 MED ORDER — PANTOPRAZOLE SODIUM 40 MG PO TBEC: DELAYED_RELEASE_TABLET | ORAL | 2 refills | Status: DC

## 2016-02-01 MED ORDER — PRAVASTATIN SODIUM 40 MG PO TABS: 40.0000 mg | ORAL_TABLET | Freq: Every day | ORAL | 3 refills | Status: DC

## 2016-02-01 MED ORDER — RANITIDINE HCL 300 MG PO TABS: ORAL_TABLET | ORAL | 1 refills | Status: DC

## 2016-02-01 MED ORDER — LISINOPRIL 10 MG PO TABS: 10.0000 mg | ORAL_TABLET | Freq: Every day | ORAL | 3 refills | Status: DC

## 2016-02-01 MED ORDER — POLYETHYLENE GLYCOL 3350 17 GM/SCOOP PO POWD: ORAL | 11 refills | Status: DC

## 2016-02-01 MED ORDER — IBUPROFEN 400 MG PO TABS: ORAL_TABLET | ORAL | 3 refills | Status: DC

## 2016-02-01 NOTE — Patient Instructions (Signed)
Great to see you!  Come back in 6 months for follow up of HTN

## 2016-02-01 NOTE — Progress Notes (Signed)
   HPI  Patient presents today for six-month follow-up of chronic medical conditions.  Hypertension Good medication compliance No chest pain, dyspnea, palpitations. Pressures have been reviewed and were provided by the family, they range 99 the 416 systolic, over 60 60/63/0160 diastolic. Averageis 130/80  Congestion, frontal headache- has been going on for about one week starting after an outdoor concert. Her sister has bought her some over-the-counter he congestion sweats which have helped her symptoms and she's improving. She denies any malaise, shortness of breath, or severe cough. States her symptoms are improving.   Hyperlipidemia Needs refills of medications.  GERD Severe GERD symptoms, worked up at [employer], patients sister is states that she has been stable on twice daily PPI plus H2 blocker for quite some time  PMH: Smoking status noted ROS: Per HPI  Objective: BP (!) 160/100   Pulse 90   Temp 98.7 F (37.1 C) (Oral)   Ht '5\' 2"'$  (1.575 m)  Gen: NAD, alert, cooperative with exam HEENT: NCAT CV: RRR, good S1/S2, no murmur Resp: CTABL, no wheezes, non-labored Abd: SNTND, BS present, no guarding or organomegaly Ext: No edema, warm Neuro: Alert and oriented, increased tone bilateral lower extremities, her right hand is flexed throughout the exam, slurred speech  Assessment and plan:  # Hypertension Elevated today, however very recently controlled at home No change medication Labs  # Hyperlipidemia Refilled pravastatin, nonfasting labs, direct LDL  # Allergic rhinitis Treat with Zyrtec for one month Reassurance provided  # GERD Controlled on current regimen Refill twice a day PPI plus H2 blockers    Orders Placed This Encounter  Procedures  . Flu Vaccine QUAD 36+ mos IM  . LDL Cholesterol, Direct  . CMP14+EGFR  . CBC with Differential    Meds ordered this encounter  Medications  . pantoprazole (PROTONIX) 40 MG tablet   Sig: TAKE 1 TABLET BY MOUTH 30 MINUTES PRIOR TO BREAKFAST AND SUPPER    Dispense:  180 tablet    Refill:  2  . lisinopril (PRINIVIL,ZESTRIL) 10 MG tablet    Sig: Take 1 tablet (10 mg total) by mouth daily.    Dispense:  90 tablet    Refill:  3  . pravastatin (PRAVACHOL) 40 MG tablet    Sig: Take 1 tablet (40 mg total) by mouth daily.    Dispense:  90 tablet    Refill:  3    Needs to be seen before next refill  . ranitidine (ZANTAC) 300 MG tablet    Sig: 1 PO EVERY MORNING AS NEEDED TO PREVENT HEARTBURN    Dispense:  90 tablet    Refill:  1  . polyethylene glycol powder (GLYCOLAX/MIRALAX) powder    Sig: 17 G PO ONCE OR TWICE DAILY    Dispense:  850 g    Refill:  11  . ibuprofen (ADVIL,MOTRIN) 400 MG tablet    Sig: TAKE 1 TABLET (600 MG TOTAL) BY MOUTH EVERY 8 (EIGHT) HOURS AS NEEDED FOR PAIN (TAKE WITH MEALS).    Dispense:  30 tablet    Refill:  3    Needs to be seen before next refill  . cetirizine (ZYRTEC) 10 MG tablet    Sig: Take 1 tablet (10 mg total) by mouth daily. For 30 days    Dispense:  30 tablet    Refill:  [place], MD [facility] 02/01/2016, 5:17 PM

## 2016-02-02 LAB — CBC WITH DIFFERENTIAL/PLATELET
BASOS ABS: 0 10*3/uL (ref 0.0–0.2)
BASOS: 0 %
EOS (ABSOLUTE): 0.2 10*3/uL (ref 0.0–0.4)
Eos: 2 %
Hematocrit: 37.7 % (ref 34.0–46.6)
Hemoglobin: 12.4 g/dL (ref 11.1–15.9)
IMMATURE GRANS (ABS): 0 10*3/uL (ref 0.0–0.1)
Immature Granulocytes: 0 %
LYMPHS ABS: 2.6 10*3/uL (ref 0.7–3.1)
LYMPHS: 33 %
MCH: 30.2 pg (ref 26.6–33.0)
MCHC: 32.9 g/dL (ref 31.5–35.7)
MCV: 92 fL (ref 79–97)
MONOS ABS: 0.4 10*3/uL (ref 0.1–0.9)
Monocytes: 5 %
NEUTROS ABS: 4.7 10*3/uL (ref 1.4–7.0)
Neutrophils: 60 %
PLATELETS: 358 10*3/uL (ref 150–379)
RBC: 4.11 x10E6/uL (ref 3.77–5.28)
RDW: 13.6 % (ref 12.3–15.4)
WBC: 7.9 10*3/uL (ref 3.4–10.8)

## 2016-02-02 LAB — CMP14+EGFR
A/G RATIO: 1.3 (ref 1.2–2.2)
ALT: 20 IU/L (ref 0–32)
AST: 13 IU/L (ref 0–40)
Albumin: 4.1 g/dL (ref 3.5–5.5)
Alkaline Phosphatase: 82 IU/L (ref 39–117)
BILIRUBIN TOTAL: 0.2 mg/dL (ref 0.0–1.2)
BUN / CREAT RATIO: 23 (ref 9–23)
BUN: 16 mg/dL (ref 6–24)
CALCIUM: 9.8 mg/dL (ref 8.7–10.2)
CHLORIDE: 103 mmol/L (ref 96–106)
CO2: 25 mmol/L (ref 18–29)
Creatinine, Ser: 0.71 mg/dL (ref 0.57–1.00)
GFR, EST AFRICAN AMERICAN: 113 mL/min/{1.73_m2} (ref >59)
GFR, EST NON AFRICAN AMERICAN: 98 mL/min/{1.73_m2} (ref >59)
GLOBULIN, TOTAL: 3.2 g/dL (ref 1.5–4.5)
Glucose: 115 mg/dL — ABNORMAL HIGH (ref 65–99)
POTASSIUM: 3.9 mmol/L (ref 3.5–5.2)
SODIUM: 142 mmol/L (ref 134–144)
TOTAL PROTEIN: 7.3 g/dL (ref 6.0–8.5)

## 2016-02-02 LAB — LDL CHOLESTEROL, DIRECT: LDL DIRECT: 74 mg/dL (ref 0–99)

## 2016-02-08 ENCOUNTER — Other Ambulatory Visit: Payer: Self-pay | Admitting: *Deleted

## 2016-02-09 MED ORDER — MEGESTROL ACETATE 40 MG PO TABS: ORAL_TABLET | ORAL | 3 refills | Status: DC

## 2016-03-19 DIAGNOSIS — G809 Cerebral palsy, unspecified: Secondary | ICD-10-CM | POA: Diagnosis not present

## 2016-03-19 DIAGNOSIS — H5213 Myopia, bilateral: Secondary | ICD-10-CM | POA: Diagnosis not present

## 2016-03-24 ENCOUNTER — Other Ambulatory Visit: Payer: Self-pay | Admitting: Family Medicine

## 2016-06-03 ENCOUNTER — Encounter: Payer: Self-pay | Admitting: Obstetrics & Gynecology

## 2016-06-03 ENCOUNTER — Ambulatory Visit (INDEPENDENT_AMBULATORY_CARE_PROVIDER_SITE_OTHER): Payer: Medicare Other | Admitting: Obstetrics & Gynecology

## 2016-06-03 ENCOUNTER — Other Ambulatory Visit: Payer: Self-pay | Admitting: Obstetrics & Gynecology

## 2016-06-03 VITALS — BP 150/100 | HR 92

## 2016-06-03 DIAGNOSIS — N938 Other specified abnormal uterine and vaginal bleeding: Secondary | ICD-10-CM

## 2016-06-03 DIAGNOSIS — I10 Essential (primary) hypertension: Secondary | ICD-10-CM | POA: Diagnosis not present

## 2016-06-03 MED ORDER — MEGESTROL ACETATE 40 MG PO TABS: ORAL_TABLET | ORAL | 11 refills | Status: DC

## 2016-06-03 NOTE — Progress Notes (Signed)
Chief Complaint  Patient presents with  . BTB    taking Megace, need refill    Blood pressure (!) 150/100, pulse 92.  51 y.o. No obstetric history on file. No LMP recorded. Patient is not currently having periods (Reason: Oral contraceptives). The current method of family planning is megestrol.  Outpatient Encounter Prescriptions as of 06/03/2016  Medication Sig  . aspirin 81 MG tablet Take 1 tablet (81 mg total) by mouth daily.  Marland Kitchen lisinopril (PRINIVIL,ZESTRIL) 10 MG tablet Take 1 tablet (10 mg total) by mouth daily.  . megestrol (MEGACE) 40 MG tablet Take 3 tablets every day  . Multiple Vitamin (MULTIVITAMIN) tablet Take 1 tablet by mouth daily.  . pantoprazole (PROTONIX) 40 MG tablet TAKE 1 TABLET BY MOUTH 30 MINUTES PRIOR TO BREAKFAST AND SUPPER  . polyethylene glycol powder (GLYCOLAX/MIRALAX) powder 17 G PO ONCE OR TWICE DAILY  . pravastatin (PRAVACHOL) 40 MG tablet Take 1 tablet (40 mg total) by mouth daily.  . [DISCONTINUED] megestrol (MEGACE) 40 MG tablet Take 3 tablets every day  . ibuprofen (ADVIL,MOTRIN) 400 MG tablet TAKE 1 TABLET (600 MG TOTAL) BY MOUTH EVERY 8 (EIGHT) HOURS AS NEEDED FOR PAIN (TAKE WITH MEALS). (Patient not taking: Reported on 06/03/2016)  . [DISCONTINUED] cetirizine (ZYRTEC) 10 MG tablet TAKE 1 TABLET (10 MG TOTAL) BY MOUTH DAILY. FOR 30 DAYS  . [DISCONTINUED] ranitidine (ZANTAC) 300 MG tablet 1 PO EVERY MORNING AS NEEDED TO PREVENT HEARTBURN   No facility-administered encounter medications on file as of 06/03/2016.     Subjective Pt with CP on megestrol some BTB, titrating megestrol as needed  Objective   Pertinent ROS No burning with urination, frequency or urgency No nausea, vomiting or diarrhea Nor fever chills or other constitutional symptoms   Labs or studies     Impression Diagnoses this Encounter::   ICD-9-CM ICD-10-CM   1. DUB (dysfunctional uterine bleeding) 626.8 N93.8     Established relevant  diagnosis(es):   Plan/Recommendations: Meds ordered this encounter  Medications  . megestrol (MEGACE) 40 MG tablet    Sig: Take 3 tablets every day    Dispense:  90 tablet    Refill:  11    Labs or Scans Ordered: No orders of the defined types were placed in this encounter.   Management:: Continue megestrol for bleeding management Overall doing well  Follow up Return in about 1 year (around 06/03/2017) for yearly, with Dr Elonda Husky.        Face to face time:  15 minutes  Greater than 50% of the visit time was spent in counseling and coordination of care with the patient.  The summary and outline of the counseling and care coordination is summarized in the note above.   All questions were answered.  Past Medical History:  Diagnosis Date  . Cerebral palsy ([place])   . Complication of anesthesia 12/09/2013   Respiratory instability with 10 mg Propofol.Poor gag reflex.  . Constipation - functional   . Dysphagia 2003   2O to GERD/HH, ?difficulty with large food bolus due to neuromuscular weakness  . Epigastric pain MAY 2012 ABD [place] NL GBLIVPAN   2o to GERD V. NON-ULCER DYSPEPSIA  . GERD (gastroesophageal reflux disease) 2007   BPE NL ESO, REFLUX  . Hiatal hernia   . HIATAL HERNIA 11/23/2008   Qualifier: Diagnosis of  By: Zeb Comfort    . HTN (hypertension)   . Hyperlipidemia     Past Surgical History:  Procedure Laterality  Date  . COLONOSCOPY  APR 2009 with proprofol   SLF: SIMPLE ADENOMAS  . UPPER GASTROINTESTINAL ENDOSCOPY  MAY 2012 DIL 16 mm   SLF: NO DEFINITE STRICTURE APPRECIATED  . UPPER GASTROINTESTINAL ENDOSCOPY  FEB 2009-HYPOXIA REQUIRING NARCAN, & VERSED, D50V4   SLF: NL ESO, FUNDIC GLAND POLYPS  . UPPER GASTROINTESTINAL ENDOSCOPY  w/ DIL 2003, 2004, 2006    OB History    No data available      Allergies  Allergen Reactions  . Hydrocodone     Vomiting and hallunications    Social History   Social History  . Marital status: Single    Spouse  name: N/A  . Number of children: N/A  . Years of education: N/A   Social History Main Topics  . Smoking status: Never Smoker  . Smokeless tobacco: Never Used  . Alcohol use No  . Drug use: No  . Sexual activity: Not Currently    Birth control/ protection: None   Other Topics Concern  . None   Social History Narrative  . None    Family History  Problem Relation Age of Onset  . Colon cancer Mother     LATE 50S

## 2016-06-04 ENCOUNTER — Other Ambulatory Visit: Payer: Self-pay | Admitting: *Deleted

## 2016-06-04 MED ORDER — MEGESTROL ACETATE 40 MG PO TABS: ORAL_TABLET | ORAL | 11 refills | Status: DC

## 2016-06-05 ENCOUNTER — Telehealth: Payer: Self-pay | Admitting: *Deleted

## 2016-06-05 NOTE — Telephone Encounter (Signed)
Left message to inform patient that prescription was sent to pharmacy.

## 2016-07-08 ENCOUNTER — Other Ambulatory Visit: Payer: Self-pay | Admitting: Family Medicine

## 2016-07-08 DIAGNOSIS — K219 Gastro-esophageal reflux disease without esophagitis: Secondary | ICD-10-CM

## 2016-07-30 ENCOUNTER — Other Ambulatory Visit: Payer: Self-pay | Admitting: Gastroenterology

## 2016-08-01 ENCOUNTER — Ambulatory Visit: Payer: Medicare Other | Admitting: Family Medicine

## 2016-08-06 ENCOUNTER — Ambulatory Visit (INDEPENDENT_AMBULATORY_CARE_PROVIDER_SITE_OTHER): Payer: Medicare Other | Admitting: Family Medicine

## 2016-08-06 ENCOUNTER — Encounter: Payer: Self-pay | Admitting: Family Medicine

## 2016-08-06 VITALS — BP 158/102 | HR 74 | Temp 97.6°F

## 2016-08-06 DIAGNOSIS — E785 Hyperlipidemia, unspecified: Secondary | ICD-10-CM | POA: Diagnosis not present

## 2016-08-06 DIAGNOSIS — I10 Essential (primary) hypertension: Secondary | ICD-10-CM | POA: Diagnosis not present

## 2016-08-06 DIAGNOSIS — R7309 Other abnormal glucose: Secondary | ICD-10-CM

## 2016-08-06 DIAGNOSIS — H8110 Benign paroxysmal vertigo, unspecified ear: Secondary | ICD-10-CM | POA: Diagnosis not present

## 2016-08-06 LAB — BAYER DCA HB A1C WAIVED: HB A1C: 6.3 % (ref <7.0)

## 2016-08-06 MED ORDER — LISINOPRIL 20 MG PO TABS: 20.0000 mg | ORAL_TABLET | Freq: Every day | ORAL | 3 refills | Status: DC

## 2016-08-06 NOTE — Patient Instructions (Addendum)
Great to see you!  Come back in1-2 months unless you need [place] sooner.   Previously there was a lab that technically placed you in the pre-diabetic range of blood sugars, I am repeating it today.

## 2016-08-06 NOTE — Progress Notes (Signed)
   HPI  Patient presents today for follow-up for hypertension, hyperlipidemia, and complain about dizziness.  Dizziness Has been going on for many months off and on, patient states it happens about 3 or 4 days a week when she first gets out of bed, she describes room spinning sensation with the help of her sister. She states that this goes away by itself in 20-30 minutes.   Hypertension Blood pressure usually 140s over 80s at home No chest pain, dyspnea, palpitations.  Hyperlipidemia Good medication compliance, no side effects. Patient has only drank a little bit of orange juice this morning  PMH: Smoking status noted ROS: Per HPI  Objective: BP (!) 158/102   Pulse 74   Temp 97.6 F (36.4 C) (Oral)  Gen: NAD, alert, cooperative with exam HEENT: NCAT CV: RRR, good S1/S2, no murmur Resp: CTABL, no wheezes, non-labored Ext: No edema, warm Neuro: Alert and oriented, No gross deficits  Assessment and plan:  # Elevated A1c Previous A1c of 5.8 in April 2015, repeat today Discussed possibility of prediabetes  # BPPV Self-limited mild symptoms, offered vestibular physical therapy, patient will wait watchfully  # Hypertension Elevated, persistently elevated on most checks in our office. Also elevated at home per their report Increase lisinopril to 20 mg  # Hyperlipidemia Repeat labs, continue simvastatin Clinically stable    Orders Placed This Encounter  Procedures  . CMP14+EGFR  . Lipid panel    Meds ordered this encounter  Medications  . lisinopril (PRINIVIL,ZESTRIL) 20 MG tablet    Sig: Take 1 tablet (20 mg total) by mouth daily.    Dispense:  90 tablet    Refill:  [place], MD [place] 08/06/2016, 9:07 AM

## 2016-08-07 LAB — CMP14+EGFR
A/G RATIO: 1.4 (ref 1.2–2.2)
ALK PHOS: 82 IU/L (ref 39–117)
ALT: 23 IU/L (ref 0–32)
AST: 19 IU/L (ref 0–40)
Albumin: 4.5 g/dL (ref 3.5–5.5)
BILIRUBIN TOTAL: 0.2 mg/dL (ref 0.0–1.2)
BUN/Creatinine Ratio: 19 (ref 9–23)
BUN: 14 mg/dL (ref 6–24)
CHLORIDE: 104 mmol/L (ref 96–106)
CO2: 22 mmol/L (ref 18–29)
Calcium: 9.7 mg/dL (ref 8.7–10.2)
Creatinine, Ser: 0.72 mg/dL (ref 0.57–1.00)
GFR calc Af Amer: 111 mL/min/{1.73_m2} (ref >59)
GFR calc non Af Amer: 96 mL/min/{1.73_m2} (ref >59)
Globulin, Total: 3.2 g/dL (ref 1.5–4.5)
Glucose: 103 mg/dL — ABNORMAL HIGH (ref 65–99)
POTASSIUM: 4.3 mmol/L (ref 3.5–5.2)
Sodium: 144 mmol/L (ref 134–144)
Total Protein: 7.7 g/dL (ref 6.0–8.5)

## 2016-08-07 LAB — LIPID PANEL
Chol/HDL Ratio: 2.7 ratio units (ref 0.0–4.4)
Cholesterol, Total: 141 mg/dL (ref 100–199)
HDL: 52 mg/dL (ref >39)
LDL Calculated: 79 mg/dL (ref 0–99)
TRIGLYCERIDES: 51 mg/dL (ref 0–149)
VLDL Cholesterol Cal: 10 mg/dL (ref 5–40)

## 2016-08-28 ENCOUNTER — Other Ambulatory Visit: Payer: Self-pay | Admitting: Gastroenterology

## 2016-09-23 ENCOUNTER — Ambulatory Visit (INDEPENDENT_AMBULATORY_CARE_PROVIDER_SITE_OTHER): Payer: Medicare Other | Admitting: Family Medicine

## 2016-09-23 ENCOUNTER — Encounter: Payer: Self-pay | Admitting: Family Medicine

## 2016-09-23 VITALS — BP 170/91 | HR 73 | Temp 97.4°F

## 2016-09-23 DIAGNOSIS — I10 Essential (primary) hypertension: Secondary | ICD-10-CM | POA: Diagnosis not present

## 2016-09-23 MED ORDER — LISINOPRIL 40 MG PO TABS: 40.0000 mg | ORAL_TABLET | Freq: Every day | ORAL | 3 refills | Status: DC

## 2016-09-23 NOTE — Patient Instructions (Signed)
Great to see you!  Try to check blood pressures 2-3 times a week, our goal is 140/90 or less.   We will call with labs or send a message on mychart within 1 week.

## 2016-09-23 NOTE — Progress Notes (Signed)
   HPI  Patient presents today here to follow-up for hypertension.  Patient was seen about 2 months ago and lisinopril was increased from 20 mg to 30 mg, she's tolerated easily without any problems. She denies any dizziness, fatigue, or weakness. They have not been checking her blood pressure at home.  Speech difficulty understand with cerebral palsy, her sister's very helpful  PMH: Smoking status noted ROS: Per HPI  Objective: BP (!) 170/91   Pulse 73   Temp 97.4 F (36.3 C) (Oral)  Gen: NAD, alert, cooperative with exam HEENT: NCAT CV: RRR, good S1/S2, no murmur Resp: CTABL, no wheezes, non-labored Ext: No edema, warm Neuro: Alert and oriented, No gross deficits  Assessment and plan:  # HTN Blood pressure still not at goal, 150/89 confirmed by manual cuff. Increasing lisinopril to 40 mg, recheck in 3 months Recheck BMP today.    Orders Placed This Encounter  Procedures  . BMP8+EGFR    Meds ordered this encounter  Medications  . lisinopril (PRINIVIL,ZESTRIL) 40 MG tablet    Sig: Take 1 tablet (40 mg total) by mouth daily.    Dispense:  90 tablet    Refill:  [place], MD [place] 09/23/2016, 9:06 AM

## 2016-09-24 LAB — BMP8+EGFR
BUN/Creatinine Ratio: 15 (ref 9–23)
BUN: 11 mg/dL (ref 6–24)
CO2: 19 mmol/L (ref 18–29)
Calcium: 9.6 mg/dL (ref 8.7–10.2)
Chloride: 108 mmol/L — ABNORMAL HIGH (ref 96–106)
Creatinine, Ser: 0.72 mg/dL (ref 0.57–1.00)
GFR calc Af Amer: 111 mL/min/{1.73_m2} (ref >59)
GFR, EST NON AFRICAN AMERICAN: 96 mL/min/{1.73_m2} (ref >59)
GLUCOSE: 87 mg/dL (ref 65–99)
POTASSIUM: 4.4 mmol/L (ref 3.5–5.2)
SODIUM: 146 mmol/L — AB (ref 134–144)

## 2016-10-12 ENCOUNTER — Other Ambulatory Visit: Payer: Self-pay | Admitting: Family Medicine

## 2016-10-12 DIAGNOSIS — K219 Gastro-esophageal reflux disease without esophagitis: Secondary | ICD-10-CM

## 2016-11-19 ENCOUNTER — Other Ambulatory Visit: Payer: Self-pay | Admitting: Family Medicine

## 2016-12-24 ENCOUNTER — Ambulatory Visit (INDEPENDENT_AMBULATORY_CARE_PROVIDER_SITE_OTHER): Payer: Medicare Other

## 2016-12-24 ENCOUNTER — Encounter: Payer: Self-pay | Admitting: Family Medicine

## 2016-12-24 ENCOUNTER — Ambulatory Visit (INDEPENDENT_AMBULATORY_CARE_PROVIDER_SITE_OTHER): Payer: Medicare Other | Admitting: Family Medicine

## 2016-12-24 VITALS — BP 165/101 | HR 79 | Temp 98.1°F

## 2016-12-24 DIAGNOSIS — M25462 Effusion, left knee: Secondary | ICD-10-CM | POA: Diagnosis not present

## 2016-12-24 DIAGNOSIS — R7303 Prediabetes: Secondary | ICD-10-CM | POA: Diagnosis not present

## 2016-12-24 DIAGNOSIS — I10 Essential (primary) hypertension: Secondary | ICD-10-CM | POA: Diagnosis not present

## 2016-12-24 LAB — BAYER DCA HB A1C WAIVED: HB A1C (BAYER DCA - WAIVED): 6.2 % (ref <7.0)

## 2016-12-24 MED ORDER — MELOXICAM 15 MG PO TABS: 15.0000 mg | ORAL_TABLET | Freq: Every day | ORAL | 0 refills | Status: DC

## 2016-12-24 NOTE — Patient Instructions (Signed)
Great to see you!  Come back in 4 weeks with a blood pressure log  We will work on an orthopedics referral  We will let you know how the xray looks

## 2016-12-24 NOTE — Progress Notes (Signed)
   HPI  Patient presents today . Left knee swelling and pain, hypertension, prediabetes.  Hypertension Good medication compliance overall, blood pressure usually 140s over 80s, did not take medication this morning due to fasting.  Prediabetes No diet change Patient does not eat a high carbohydrate diet.  Left knee swelling and pain Patient states that she had pain with swelling start first, this was followed by anterior left knee soreness. She denies any injury. She has cerebral palsy and at times crawls.   PMH: Smoking status noted ROS: Per HPI  Objective: BP (!) 165/101   Pulse 79   Temp 98.1 F (36.7 C) (Oral)  Gen: NAD, alert, cooperative with exam HEENT: NCAT, EOMI, PERRL CV: RRR, good S1/S2, no murmur Resp: CTABL, no wheezes, non-labored Abd: SNTND, BS present, no guarding or organomegaly Ext: No edema, warm Neuro: Alert and oriented, No gross deficits  MSK: L knee without erythema or bruising Positive effusion most notable over the superior tibia Positive medial joint line tenderness.  ligamentously intact to Lachman's and with varus and valgus stress.  Negative McMurray's test   Assessment and plan:  # Left knee effusion Plain film, refer to orthopedics given significant swelling. Short course of meloxicam No clear etiology, however she does seem to have tenderness over the medial joint line would consider meniscal injury, also would consider gout  # Hypertension Elevated today Continue current medications, patient has not taken medication today Labs are up-to-date Blood pressure log given  # pre-diabetes Likely stable, has not made any changes A1C pendoing    Orders Placed This Encounter  Procedures  . DG Knee 1-2 Views Left    Standing Status:   Future    Number of Occurrences:   1    Standing Expiration Date:   02/23/2018    Order Specific Question:   Reason for Exam (SYMPTOM  OR DIAGNOSIS REQUIRED)    Answer:   L knee effusion    Order  Specific Question:   Is the patient pregnant?    Answer:   No    Order Specific Question:   Preferred imaging location?    Answer:   Internal  . Bayer DCA Hb A1c Waived  . Ambulatory referral to Orthopedic Surgery    Referral Priority:   Routine    Referral Type:   Surgical    Referral Reason:   Specialty Services Required    Requested Specialty:   Orthopedic Surgery    Number of Visits Requested:   1    Meds ordered this encounter  Medications  . meloxicam (MOBIC) 15 MG tablet    Sig: Take 1 tablet (15 mg total) by mouth daily.    Dispense:  14 tablet    Refill:  0    Laroy Apple, MD [facility] 12/24/2016, 8:57 AM

## 2017-01-02 ENCOUNTER — Ambulatory Visit (INDEPENDENT_AMBULATORY_CARE_PROVIDER_SITE_OTHER): Payer: Medicare Other | Admitting: Orthopaedic Surgery

## 2017-01-02 ENCOUNTER — Encounter (INDEPENDENT_AMBULATORY_CARE_PROVIDER_SITE_OTHER): Payer: Self-pay | Admitting: Orthopaedic Surgery

## 2017-01-02 VITALS — BP 144/79 | Ht 60.0 in | Wt 135.0 lb

## 2017-01-02 DIAGNOSIS — M7052 Other bursitis of knee, left knee: Secondary | ICD-10-CM

## 2017-01-02 NOTE — Progress Notes (Signed)
Office Visit Note   Patient: [name]           Date of Birth: Mar 29, 1966           MRN: [id number] Visit Date: 01/02/2017              Requested by: Timmothy Euler, MD [place], [place] [id number] PCP: Timmothy Euler, MD   Assessment & Plan: Visit Diagnoses:  1. Other bursitis of knee, left knee           Pretibial tubercle bursitis.  Plan: Her swelling is not significant enough to consider aspiration she can continue some ice and anti-inflammatories. We discussed this is likely due to contact on her knee was some reaction bursitis.  Follow-Up Instructions: Return if symptoms worsen or fail to improve.   Orders:  No orders of the defined types were placed in this encounter.  No orders of the defined types were placed in this encounter.     Procedures: No procedures performed   Clinical Data: No additional findings.   Subjective: Chief Complaint  Patient presents with  . Left Knee - Pain    HPI 51 year old female with the 4 weeks history of some anterior swelling over left anterior to the tibial tubercle. Does not recall any specific trauma. She's been on anti-inflammatories with slight improvement. No locking of her knee no history of other rheumatologic conditions negative for gout. Review of Systems  Constitutional: Negative for chills and diaphoresis.  HENT: Negative for ear discharge, ear pain and nosebleeds.   Eyes: Negative for discharge and visual disturbance.  Respiratory: Negative for cough, choking and shortness of breath.   Cardiovascular: Negative for chest pain and palpitations.  Gastrointestinal: Negative for abdominal distention and abdominal pain.  Endocrine: Negative for cold intolerance and heat intolerance.  Genitourinary: Negative for flank pain and hematuria.  Musculoskeletal:       Positive for right anterior left knee swelling over the tibial tubercle  Skin: Negative for rash and wound.  Neurological: Negative for  seizures and speech difficulty.  Hematological: Negative for adenopathy. Does not bruise/bleed easily.  Psychiatric/Behavioral: Negative for agitation and suicidal ideas.   [place] prediabetes as reflux also hypertension.   Objective: Vital Signs: BP (!) 144/79   Ht 5' (1.524 m)   Wt 135 lb (61.2 kg)   BMI 26.37 kg/m   Physical Exam  Constitutional: She is oriented to person, place, and time. She appears well-developed.  HENT:  Head: Normocephalic.  Right Ear: External ear normal.  Left Ear: External ear normal.  Eyes: Pupils are equal, round, and reactive to light.  Neck: No tracheal deviation present. No thyromegaly present.  Cardiovascular: Normal rate.   Pulmonary/Chest: Effort normal.  Abdominal: Soft.  Musculoskeletal:  Patient has normal patellar tracking normal hip range of motion a straight leg raising negative sciatic notch tenderness. There is some prominence over the tibial tubercle with some pretibial tubercle bursitis. Patellar tendon is normal. Slight crepitus with knee extension and patellar loading. Collateral cruciate ligament exam is normal. Anterior tib EHL is strong.  Neurological: She is alert and oriented to person, place, and time.  Skin: Skin is warm and dry.  Psychiatric: She has a normal mood and affect. Her behavior is normal.    Ortho Exam  Specialty Comments:  No specialty comments available.  Imaging: No results found.   PMFS History: Patient Active Problem List   Diagnosis Date Noted  . Prediabetes 12/24/2016  . Healthcare  maintenance 12/26/2014  . Headache 12/26/2014  . Frequency of micturition 09/03/2013  . Need for Tdap vaccination 09/03/2013  . Pain in limb 04/12/2013  . Wart viral 04/12/2013  . HLD (hyperlipidemia) 09/16/2012  . Vitamin D deficiency 09/16/2012  . Hyperglycemia 09/16/2012  . HTN (hypertension) 09/16/2012  . 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 DENTAL PAIN 07/11/2009  . CEREBRAL PALSY 11/23/2008  . GERD  11/23/2008  . Constipation 11/23/2008  . Dysphagia 11/23/2008   Past Medical History:  Diagnosis Date  . Cerebral palsy ([place])   . Complication of anesthesia 12/09/2013   Respiratory instability with 10 mg Propofol.Poor gag reflex.  . Constipation - functional   . Dysphagia 2003   2O to GERD/HH, ?difficulty with large food bolus due to neuromuscular weakness  . Epigastric pain MAY 2012 ABD [place] NL GBLIVPAN   2o to GERD V. NON-ULCER DYSPEPSIA  . GERD (gastroesophageal reflux disease) 2007   BPE NL ESO, REFLUX  . Hiatal hernia   . HIATAL HERNIA 11/23/2008   Qualifier: Diagnosis of  By: Zeb Comfort    . HTN (hypertension)   . Hyperlipidemia     Family History  Problem Relation Age of Onset  . Colon cancer Mother        LATE 50S    Past Surgical History:  Procedure Laterality Date  . COLONOSCOPY  APR 2009 with proprofol   SLF: SIMPLE ADENOMAS  . UPPER GASTROINTESTINAL ENDOSCOPY  MAY 2012 DIL 16 mm   SLF: NO DEFINITE STRICTURE APPRECIATED  . UPPER GASTROINTESTINAL ENDOSCOPY  FEB 2009-HYPOXIA REQUIRING NARCAN, & VERSED, D50V4   SLF: NL ESO, FUNDIC GLAND POLYPS  . UPPER GASTROINTESTINAL ENDOSCOPY  w/ DIL 2003, 2004, 2006   Social History   Occupational History  . Not on file.   Social History Main Topics  . Smoking status: Never Smoker  . Smokeless tobacco: Never Used  . Alcohol use No  . Drug use: No  . Sexual activity: Not Currently    Birth control/ protection: None

## 2017-01-21 ENCOUNTER — Ambulatory Visit (INDEPENDENT_AMBULATORY_CARE_PROVIDER_SITE_OTHER): Payer: Medicare Other | Admitting: Family Medicine

## 2017-01-21 ENCOUNTER — Encounter: Payer: Self-pay | Admitting: Family Medicine

## 2017-01-21 VITALS — BP 138/90 | HR 76 | Temp 97.3°F

## 2017-01-21 DIAGNOSIS — I10 Essential (primary) hypertension: Secondary | ICD-10-CM | POA: Diagnosis not present

## 2017-01-21 LAB — BMP8+EGFR
BUN/Creatinine Ratio: 19 (ref 9–23)
BUN: 15 mg/dL (ref 6–24)
CALCIUM: 9.5 mg/dL (ref 8.7–10.2)
CHLORIDE: 106 mmol/L (ref 96–106)
CO2: 22 mmol/L (ref 20–29)
Creatinine, Ser: 0.8 mg/dL (ref 0.57–1.00)
GFR calc Af Amer: 97 mL/min/{1.73_m2} (ref >59)
GFR calc non Af Amer: 84 mL/min/{1.73_m2} (ref >59)
GLUCOSE: 92 mg/dL (ref 65–99)
Potassium: 3.7 mmol/L (ref 3.5–5.2)
Sodium: 146 mmol/L — ABNORMAL HIGH (ref 134–144)

## 2017-01-21 NOTE — Patient Instructions (Signed)
Great to see you!  Come back in 4 months unless you need us sooner.    

## 2017-01-21 NOTE — Progress Notes (Signed)
   HPI  Patient presents today here to follow-up for hypertension.  Patient was seen about one month ago uncontrolled hypertension, we increased lisinopril to 40 mg.  Since leaving, she is tolerating medication well. Denies any side effects.  Blood pressure at home has been ranging 396-728 systolic over 97V diastolic, blood pressure readings are mostly in the 130s and 140s.   PMH: Smoking status noted ROS: Per HPI  Objective: BP 138/90   Pulse 76   Temp (!) 97.3 F (36.3 C) (Oral)  Gen: NAD, alert, cooperative with exam HEENT: NCAT CV: RRR, good S1/S2, no murmur Resp: CTABL, no wheezes, non-labored Ext: No edema, warm Neuro: Alert and oriented, hypertonia consistent with cerebral palsy  Assessment and plan:  # Hypertension Elevated, however improved well. Some inconsistent measurements due to cervical palsy I believe. Patient had wide fluctuations due to muscle flexion while I was checking manually. Continue current dose of lisinopril Repeat BMP 4 months follow up   Orders Placed This Encounter  Procedures  . [place], MD [facility] 01/21/2017, 8:23 AM

## 2017-02-11 ENCOUNTER — Other Ambulatory Visit: Payer: Self-pay | Admitting: Family Medicine

## 2017-02-11 DIAGNOSIS — E785 Hyperlipidemia, unspecified: Secondary | ICD-10-CM

## 2017-02-15 ENCOUNTER — Other Ambulatory Visit: Payer: Self-pay | Admitting: Family Medicine

## 2017-04-05 ENCOUNTER — Other Ambulatory Visit: Payer: Self-pay | Admitting: Family Medicine

## 2017-04-05 DIAGNOSIS — K219 Gastro-esophageal reflux disease without esophagitis: Secondary | ICD-10-CM

## 2017-05-12 ENCOUNTER — Other Ambulatory Visit: Payer: Self-pay | Admitting: Family Medicine

## 2017-05-12 DIAGNOSIS — E785 Hyperlipidemia, unspecified: Secondary | ICD-10-CM

## 2017-05-13 ENCOUNTER — Other Ambulatory Visit: Payer: Self-pay | Admitting: Family Medicine

## 2017-05-26 ENCOUNTER — Ambulatory Visit: Payer: Medicare Other | Admitting: Family Medicine

## 2017-06-09 ENCOUNTER — Encounter: Payer: Self-pay | Admitting: Family Medicine

## 2017-06-09 ENCOUNTER — Ambulatory Visit (INDEPENDENT_AMBULATORY_CARE_PROVIDER_SITE_OTHER): Payer: Medicare Other | Admitting: Family Medicine

## 2017-06-09 VITALS — BP 148/86 | HR 84 | Temp 99.4°F

## 2017-06-09 DIAGNOSIS — R7303 Prediabetes: Secondary | ICD-10-CM | POA: Diagnosis not present

## 2017-06-09 DIAGNOSIS — E785 Hyperlipidemia, unspecified: Secondary | ICD-10-CM

## 2017-06-09 DIAGNOSIS — K219 Gastro-esophageal reflux disease without esophagitis: Secondary | ICD-10-CM | POA: Diagnosis not present

## 2017-06-09 DIAGNOSIS — I10 Essential (primary) hypertension: Secondary | ICD-10-CM

## 2017-06-09 LAB — BAYER DCA HB A1C WAIVED: HB A1C (BAYER DCA - WAIVED): 6.2 % (ref <7.0)

## 2017-06-09 MED ORDER — MELOXICAM 15 MG PO TABS: 15.0000 mg | ORAL_TABLET | Freq: Every day | ORAL | 0 refills | Status: DC

## 2017-06-09 MED ORDER — RANITIDINE HCL 300 MG PO TABS: ORAL_TABLET | ORAL | 3 refills | Status: DC

## 2017-06-09 MED ORDER — PRAVASTATIN SODIUM 40 MG PO TABS: 40.0000 mg | ORAL_TABLET | Freq: Every day | ORAL | 3 refills | Status: DC

## 2017-06-09 NOTE — Progress Notes (Signed)
   HPI  Patient presents today for follow-up chronic medical conditions.  Hypertension Not really checking at home, however good medication compliance. No headache or chest pain.  Needs refill of meloxicam, They keep this on hand for as needed musculoskeletal pain, the use rarely.  Hyperlipidemia Fasting today. Good medication compliance.  GERD Needs refill of H2 blocker.  Prediabetes-discussed, not really watching diet  PMH: Smoking status noted ROS: Per HPI  Objective: BP (!) 148/86   Pulse 84   Temp 99.4 F (37.4 C) (Oral)  Gen: NAD, alert, cooperative with exam HEENT: NCAT CV: RRR, good S1/S2, no murmur Resp: CTABL, no wheezes, non-labored Ext: No edema, warm Neuro: Alert and oriented, No gross deficits  Assessment and plan:  #Prediabetes Discussed, A1c pending Continue to monitor about every 4-6 months  #Hypertension Elevated today, however usually well controlled No changes  #Hyperlipidemia Continue statin, repeat labs today Clinically stable Weight not recorded, however they have access to a wheelchair scale and will report weight  #GERD Doing well with H2 blocker, needs refill.  Orders Placed This Encounter  Procedures  . CMP14+EGFR  . CBC with Differential/Platelet  . Lipid panel  . TSH  . Bayer DCA Hb A1c Waived    Meds ordered this encounter  Medications  . ranitidine (ZANTAC) 300 MG tablet    Sig: TAKE 1 TABLET BY MOUTH EVERY MORNING AS NEEDED TO PREVENT HEARTBURN    Dispense:  90 tablet    Refill:  3  . pravastatin (PRAVACHOL) 40 MG tablet    Sig: Take 1 tablet (40 mg total) by mouth daily.    Dispense:  90 tablet    Refill:  3  . meloxicam (MOBIC) 15 MG tablet    Sig: Take 1 tablet (15 mg total) by mouth daily.    Dispense:  14 tablet    Refill:  0    Laroy Apple, MD [facility] 06/09/2017, 8:30 AM

## 2017-06-09 NOTE — Patient Instructions (Addendum)
Great to see you!  Come back in 4 months unless you need us sooner.    

## 2017-06-12 ENCOUNTER — Other Ambulatory Visit: Payer: Self-pay | Admitting: *Deleted

## 2017-06-12 DIAGNOSIS — E870 Hyperosmolality and hypernatremia: Secondary | ICD-10-CM

## 2017-06-16 LAB — CBC WITH DIFFERENTIAL/PLATELET
BASOS: 0 %
Basophils Absolute: 0 10*3/uL (ref 0.0–0.2)
EOS (ABSOLUTE): 0.1 10*3/uL (ref 0.0–0.4)
Eos: 1 %
Hematocrit: 39.2 % (ref 34.0–46.6)
Hemoglobin: 12.8 g/dL (ref 11.1–15.9)
IMMATURE GRANULOCYTES: 0 %
Immature Grans (Abs): 0 10*3/uL (ref 0.0–0.1)
Lymphocytes Absolute: 2.3 10*3/uL (ref 0.7–3.1)
Lymphs: 28 %
MCH: 30.1 pg (ref 26.6–33.0)
MCHC: 32.7 g/dL (ref 31.5–35.7)
MCV: 92 fL (ref 79–97)
MONOS ABS: 0.6 10*3/uL (ref 0.1–0.9)
Monocytes: 7 %
NEUTROS ABS: 5.2 10*3/uL (ref 1.4–7.0)
NEUTROS PCT: 64 %
PLATELETS: 312 10*3/uL (ref 150–379)
RBC: 4.25 x10E6/uL (ref 3.77–5.28)
RDW: 14.2 % (ref 12.3–15.4)
WBC: 8.2 10*3/uL (ref 3.4–10.8)

## 2017-06-16 LAB — CMP14+EGFR
A/G RATIO: 1.4 (ref 1.2–2.2)
ALT: 20 IU/L (ref 0–32)
AST: 19 IU/L (ref 0–40)
Albumin: 4.2 g/dL (ref 3.5–5.5)
Alkaline Phosphatase: 85 IU/L (ref 39–117)
BUN/Creatinine Ratio: 20 (ref 9–23)
BUN: 15 mg/dL (ref 6–24)
CALCIUM: 9.7 mg/dL (ref 8.7–10.2)
CO2: 18 mmol/L — ABNORMAL LOW (ref 20–29)
Chloride: 110 mmol/L — ABNORMAL HIGH (ref 96–106)
Creatinine, Ser: 0.74 mg/dL (ref 0.57–1.00)
GFR calc Af Amer: 107 mL/min/{1.73_m2} (ref >59)
GFR, EST NON AFRICAN AMERICAN: 93 mL/min/{1.73_m2} (ref >59)
GLOBULIN, TOTAL: 3 g/dL (ref 1.5–4.5)
Glucose: 89 mg/dL (ref 65–99)
POTASSIUM: 4.3 mmol/L (ref 3.5–5.2)
SODIUM: 149 mmol/L — AB (ref 134–144)
Total Protein: 7.2 g/dL (ref 6.0–8.5)

## 2017-06-16 LAB — LIPID PANEL
CHOLESTEROL TOTAL: 143 mg/dL (ref 100–199)
Chol/HDL Ratio: 2.8 ratio (ref 0.0–4.4)
HDL: 51 mg/dL (ref >39)
LDL CALC: 81 mg/dL (ref 0–99)
TRIGLYCERIDES: 53 mg/dL (ref 0–149)
VLDL Cholesterol Cal: 11 mg/dL (ref 5–40)

## 2017-06-16 LAB — TSH: TSH: 1.58 u[IU]/mL (ref 0.450–4.500)

## 2017-06-19 ENCOUNTER — Encounter: Payer: Self-pay | Admitting: Obstetrics & Gynecology

## 2017-06-19 ENCOUNTER — Other Ambulatory Visit (HOSPITAL_COMMUNITY)
Admission: RE | Admit: 2017-06-19 | Discharge: 2017-06-19 | Disposition: A | Discharge status: Home / Self Care | Payer: Medicare Other | Source: Ambulatory Visit | Attending: Obstetrics & Gynecology | Admitting: Obstetrics & Gynecology

## 2017-06-19 ENCOUNTER — Ambulatory Visit (INDEPENDENT_AMBULATORY_CARE_PROVIDER_SITE_OTHER): Payer: Medicare Other | Admitting: Obstetrics & Gynecology

## 2017-06-19 VITALS — BP 146/78 | HR 78

## 2017-06-19 DIAGNOSIS — Z01419 Encounter for gynecological examination (general) (routine) without abnormal findings: Secondary | ICD-10-CM

## 2017-06-19 MED ORDER — MEGESTROL ACETATE 40 MG PO TABS: ORAL_TABLET | ORAL | 11 refills | Status: DC

## 2017-06-19 NOTE — Progress Notes (Signed)
Subjective:     [name] is a 52 y.o. female here for a routine exam.  No LMP recorded. Patient is not currently having periods (Reason: Oral contraceptives). No obstetric history on file. Birth Control Method:  Menopause vs megace, pt wants to conitnue megestrol Menstrual Calendar(currently): amenorrhea  Current complaints: none.   Current acute medical issues:  CP   Recent Gynecologic History No LMP recorded. Patient is not currently having periods (Reason: Oral contraceptives). Last Pap: 2016,  normal Last mammogram: 2017,  normal  Past Medical History:  Diagnosis Date  . Cerebral palsy ([place])   . Complication of anesthesia 12/09/2013   Respiratory instability with 10 mg Propofol.Poor gag reflex.  . Constipation - functional   . Dysphagia 2003   2O to GERD/HH, ?difficulty with large food bolus due to neuromuscular weakness  . Epigastric pain MAY 2012 ABD [place] NL GBLIVPAN   2o to GERD V. NON-ULCER DYSPEPSIA  . GERD (gastroesophageal reflux disease) 2007   BPE NL ESO, REFLUX  . Hiatal hernia   . HIATAL HERNIA 11/23/2008   Qualifier: Diagnosis of  By: Zeb Comfort    . HTN (hypertension)   . Hyperlipidemia     Past Surgical History:  Procedure Laterality Date  . COLONOSCOPY  APR 2009 with proprofol   SLF: SIMPLE ADENOMAS  . UPPER GASTROINTESTINAL ENDOSCOPY  MAY 2012 DIL 16 mm   SLF: NO DEFINITE STRICTURE APPRECIATED  . UPPER GASTROINTESTINAL ENDOSCOPY  FEB 2009-HYPOXIA REQUIRING NARCAN, & VERSED, D50V4   SLF: NL ESO, FUNDIC GLAND POLYPS  . UPPER GASTROINTESTINAL ENDOSCOPY  w/ DIL 2003, 2004, 2006    OB History    No data available      Social History   Socioeconomic History  . Marital status: Single    Spouse name: None  . Number of children: None  . Years of education: None  . Highest education level: None  Social Needs  . Financial resource strain: None  . Food insecurity - worry: None  . Food insecurity - inability: None  . Transportation needs -  medical: None  . Transportation needs - non-medical: None  Occupational History  . None  Tobacco Use  . Smoking status: Never Smoker  . Smokeless tobacco: Never Used  Substance and Sexual Activity  . Alcohol use: No  . Drug use: No  . Sexual activity: Not Currently    Birth control/protection: None  Other Topics Concern  . None  Social History Narrative  . None    Family History  Problem Relation Age of Onset  . Colon cancer Mother        LATE 50S     Current Outpatient Medications:  .  aspirin 81 MG tablet, Take 1 tablet (81 mg total) by mouth daily., Disp: 30 tablet, Rfl: 11 .  ibuprofen (ADVIL,MOTRIN) 400 MG tablet, TAKE 1 TABLET (600 MG TOTAL) BY MOUTH EVERY 8 (EIGHT) HOURS AS NEEDED FOR PAIN (TAKE WITH MEALS)., Disp: 30 tablet, Rfl: 3 .  lisinopril (PRINIVIL,ZESTRIL) 40 MG tablet, Take 1 tablet (40 mg total) by mouth daily., Disp: 90 tablet, Rfl: 3 .  megestrol (MEGACE) 40 MG tablet, Take 3 tablets every day, Disp: 270 tablet, Rfl: 11 .  meloxicam (MOBIC) 15 MG tablet, Take 1 tablet (15 mg total) by mouth daily., Disp: 14 tablet, Rfl: 0 .  Multiple Vitamin (MULTIVITAMIN) tablet, Take 1 tablet by mouth daily., Disp: , Rfl:  .  pantoprazole (PROTONIX) 40 MG tablet, TAKE 1 TABLET BY MOUTH 30  MINUTES PRIOR TO BREAKFAST AND SUPPER, Disp: 180 tablet, Rfl: 0 .  polyethylene glycol powder (GLYCOLAX/MIRALAX) powder, MIX 17GRAMS ONCE OR TWICE DAILY, Disp: 1054 g, Rfl: 8 .  pravastatin (PRAVACHOL) 40 MG tablet, Take 1 tablet (40 mg total) by mouth daily., Disp: 90 tablet, Rfl: 3 .  ranitidine (ZANTAC) 300 MG tablet, TAKE 1 TABLET BY MOUTH EVERY MORNING AS NEEDED TO PREVENT HEARTBURN, Disp: 90 tablet, Rfl: 3  Review of Systems  Review of Systems  Constitutional: Negative for fever, chills, weight loss, malaise/fatigue and diaphoresis.  HENT: Negative for hearing loss, ear pain, nosebleeds, congestion, sore throat, neck pain, tinnitus and ear discharge.   Eyes: Negative for blurred  vision, double vision, photophobia, pain, discharge and redness.  Respiratory: Negative for cough, hemoptysis, sputum production, shortness of breath, wheezing and stridor.   Cardiovascular: Negative for chest pain, palpitations, orthopnea, claudication, leg swelling and PND.  Gastrointestinal: negative for abdominal pain. Negative for heartburn, nausea, vomiting, diarrhea, constipation, blood in stool and melena.  Genitourinary: Negative for dysuria, urgency, frequency, hematuria and flank pain.  Musculoskeletal: Negative for myalgias, back pain, joint pain and falls.  Skin: Negative for itching and rash.  Neurological: Negative for dizziness, tingling, tremors, sensory change, speech change, focal weakness, seizures, loss of consciousness, weakness and headaches.  Endo/Heme/Allergies: Negative for environmental allergies and polydipsia. Does not bruise/bleed easily.  Psychiatric/Behavioral: Negative for depression, suicidal ideas, hallucinations, memory loss and substance abuse. The patient is not nervous/anxious and does not have insomnia.        Objective:  Blood pressure (!) 146/78, pulse 78.   Physical Exam  Vitals reviewed. Constitutional: She is oriented to person, place, and time. She appears well-developed and well-nourished.  HENT:  Head: Normocephalic and atraumatic.        Right Ear: External ear normal.  Left Ear: External ear normal.  Nose: Nose normal.  Mouth/Throat: Oropharynx is clear and moist.  Eyes: Conjunctivae and EOM are normal. Pupils are equal, round, and reactive to light. Right eye exhibits no discharge. Left eye exhibits no discharge. No scleral icterus.  Neck: Normal range of motion. Neck supple. No tracheal deviation present. No thyromegaly present.  Cardiovascular: Normal rate, regular rhythm, normal heart sounds and intact distal pulses.  Exam reveals no gallop and no friction rub.   No murmur heard. Respiratory: Effort normal and breath sounds normal. No  respiratory distress. She has no wheezes. She has no rales. She exhibits no tenderness.  GI: Soft. Bowel sounds are normal. She exhibits no distension and no mass. There is no tenderness. There is no rebound and no guarding.  Genitourinary:  Breasts no masses skin changes or nipple changes bilaterally      Vulva is normal without lesions Vagina is pink moist without discharge Cervix normal in appearance and pap is done Uterus is normal size shape and contour Adnexa is negative with normal sized ovaries   Musculoskeletal: Normal range of motion. She exhibits no edema and no tenderness.  Neurological: She is alert and oriented to person, place, and time. She has normal reflexes. She displays normal reflexes. No cranial nerve deficit. She exhibits normal muscle tone. Coordination normal.  Skin: Skin is warm and dry. No rash noted. No erythema. No pallor.  Psychiatric: She has a normal mood and affect. Her behavior is normal. Judgment and thought content normal.       Medications Ordered at today's visit: Meds ordered this encounter  Medications  . megestrol (MEGACE) 40 MG tablet  Sig: Take 3 tablets every day    Dispense:  270 tablet    Refill:  11    Other orders placed at today's visit: No orders of the defined types were placed in this encounter.     Assessment:    Healthy female exam.    Plan:    Mammogram ordered. Follow up in: 1 year.     Return in about 1 year (around 06/19/2018) for Follow up, with Dr Elonda Husky.

## 2017-06-20 LAB — CYTOLOGY - PAP
Diagnosis: NEGATIVE
HPV: NOT DETECTED

## 2017-06-26 ENCOUNTER — Encounter: Payer: Self-pay | Admitting: Family Medicine

## 2017-06-26 ENCOUNTER — Ambulatory Visit (INDEPENDENT_AMBULATORY_CARE_PROVIDER_SITE_OTHER): Payer: Medicare Other | Admitting: Family Medicine

## 2017-06-26 ENCOUNTER — Ambulatory Visit (INDEPENDENT_AMBULATORY_CARE_PROVIDER_SITE_OTHER): Payer: Medicare Other

## 2017-06-26 VITALS — BP 158/102 | Temp 97.0°F | Ht 60.0 in | Wt 126.0 lb

## 2017-06-26 DIAGNOSIS — M47816 Spondylosis without myelopathy or radiculopathy, lumbar region: Secondary | ICD-10-CM | POA: Diagnosis not present

## 2017-06-26 DIAGNOSIS — M545 Low back pain, unspecified: Secondary | ICD-10-CM

## 2017-06-26 DIAGNOSIS — M4186 Other forms of scoliosis, lumbar region: Secondary | ICD-10-CM | POA: Diagnosis not present

## 2017-06-26 DIAGNOSIS — M48061 Spinal stenosis, lumbar region without neurogenic claudication: Secondary | ICD-10-CM | POA: Diagnosis not present

## 2017-06-26 MED ORDER — CYCLOBENZAPRINE HCL 10 MG PO TABS: 10.0000 mg | ORAL_TABLET | Freq: Three times a day (TID) | ORAL | 0 refills | Status: DC | PRN

## 2017-06-26 MED ORDER — MELOXICAM 15 MG PO TABS: 15.0000 mg | ORAL_TABLET | Freq: Every day | ORAL | 1 refills | Status: DC

## 2017-06-26 NOTE — Progress Notes (Signed)
   HPI  Patient presents today pain.  Patient's had symptoms for a few days.  She states that the pain previously that responded well to Flexeril.  Meloxicam helped somewhat, she is out of pills.  She is tolerating food and fluids like usual but does have a decreased appetite.  She denies dysuria, fever, chills, sweats.  PMH: Smoking status noted ROS: Per HPI  Objective: BP (!) 158/102   Temp (!) 97 F (36.1 C) (Oral)   Ht 5' (1.524 m)   Wt 126 lb (57.2 kg)   BMI 24.61 kg/m  Gen: NAD, alert, cooperative with exam HEENT: NCAT CV: RRR, good S1/S2, no murmur Resp: CTABL, no wheezes, non-labored Ext: No edema, warm Neuro: Alert and interactive, speech limited c/w CP, Negative straight leg rasie MSK Mild Tenderness to palp Right low back and midline lumbar spine  Assessment and plan:  # low bacl pain Likely muscle spasm Melxoicam, flexeril Midline TTP - Plain film ordered.  RTC with any concerns    Orders Placed This Encounter  Procedures  . DG Lumbar Spine 2-3 Views    Standing Status:   Future    Number of Occurrences:   1    Standing Expiration Date:   08/26/2018    Order Specific Question:   Reason for Exam (SYMPTOM  OR DIAGNOSIS REQUIRED)    Answer:   R sided low back pain with midline tenderness to palpation.    Order Specific Question:   Is the patient pregnant?    Answer:   No    Order Specific Question:   Preferred imaging location?    Answer:   Internal    Meds ordered this encounter  Medications  . cyclobenzaprine (FLEXERIL) 10 MG tablet    Sig: Take 1 tablet (10 mg total) by mouth 3 (three) times daily as needed for muscle spasms.    Dispense:  30 tablet    Refill:  0  . meloxicam (MOBIC) 15 MG tablet    Sig: Take 1 tablet (15 mg total) by mouth daily.    Dispense:  14 tablet    Refill:  [place], MD [facility] 06/26/2017, 9:12 AM

## 2017-06-26 NOTE — Patient Instructions (Signed)
Great to see you!   

## 2017-08-13 ENCOUNTER — Other Ambulatory Visit: Payer: Self-pay | Admitting: Family Medicine

## 2017-09-08 ENCOUNTER — Other Ambulatory Visit: Payer: Self-pay | Admitting: Family Medicine

## 2017-09-08 NOTE — Telephone Encounter (Signed)
OV 10/07/17

## 2017-10-03 ENCOUNTER — Other Ambulatory Visit: Payer: Self-pay | Admitting: Family Medicine

## 2017-10-07 ENCOUNTER — Encounter: Payer: Self-pay | Admitting: Family Medicine

## 2017-10-07 ENCOUNTER — Ambulatory Visit (INDEPENDENT_AMBULATORY_CARE_PROVIDER_SITE_OTHER): Payer: Medicare Other | Admitting: Family Medicine

## 2017-10-07 VITALS — BP 139/90 | HR 80 | Temp 98.5°F

## 2017-10-07 DIAGNOSIS — H8110 Benign paroxysmal vertigo, unspecified ear: Secondary | ICD-10-CM

## 2017-10-07 DIAGNOSIS — I10 Essential (primary) hypertension: Secondary | ICD-10-CM | POA: Diagnosis not present

## 2017-10-07 DIAGNOSIS — R7303 Prediabetes: Secondary | ICD-10-CM | POA: Diagnosis not present

## 2017-10-07 LAB — BAYER DCA HB A1C WAIVED: HB A1C: 6.3 % (ref <7.0)

## 2017-10-07 MED ORDER — CYCLOBENZAPRINE HCL 10 MG PO TABS: 10.0000 mg | ORAL_TABLET | Freq: Three times a day (TID) | ORAL | 0 refills | Status: DC | PRN

## 2017-10-07 MED ORDER — PANTOPRAZOLE SODIUM 40 MG PO TBEC: 40.0000 mg | DELAYED_RELEASE_TABLET | Freq: Two times a day (BID) | ORAL | 1 refills | Status: DC

## 2017-10-07 NOTE — Patient Instructions (Signed)
Great to see you!  Come back to see Dr. Gottschalk in 4 months   

## 2017-10-07 NOTE — Progress Notes (Signed)
   HPI  Patient presents today here for chronic medical problems.  Hypertension Good medication compliance and tolerance. No headache or chest pain.  Patient states that she had congestion last week with about 1 hour episode x2 episodes of dizziness described as world spinning type dizziness. It caused nausea and resolved on its own.  Patient does have prediabetes, she is aware of the diagnosis  PMH: Smoking status noted ROS: Per HPI  Objective: BP 139/90   Pulse 80   Temp 98.5 F (36.9 C) (Oral)  Gen: NAD, alert, cooperative with exam HEENT: NCAT, TMs normal bilaterally CV: RRR, good S1/S2, no murmur Resp: CTABL, no wheezes, non-labored Ext: No edema, warm  Assessment and plan:  #Hypertension Recently well controlled, no changes BMP  #Prediabetes A1c, watching diet, clinically stable  #BPPV Likely due to congestion and eustachian tube dysfunction Currently resolved Conservative/watchful waiting for now    Meds ordered this encounter  Medications  . pantoprazole (PROTONIX) 40 MG tablet    Sig: Take 1 tablet (40 mg total) by mouth 2 (two) times daily.    Dispense:  180 tablet    Refill:  1  . cyclobenzaprine (FLEXERIL) 10 MG tablet    Sig: Take 1 tablet (10 mg total) by mouth 3 (three) times daily as needed for muscle spasms.    Dispense:  30 tablet    Refill:  0    Laroy Apple, MD [facility] 10/07/2017, 8:27 AM

## 2017-10-08 LAB — BMP8+EGFR
BUN/Creatinine Ratio: 19 (ref 9–23)
BUN: 12 mg/dL (ref 6–24)
CO2: 19 mmol/L — AB (ref 20–29)
CREATININE: 0.63 mg/dL (ref 0.57–1.00)
Calcium: 9.6 mg/dL (ref 8.7–10.2)
Chloride: 105 mmol/L (ref 96–106)
GFR calc Af Amer: 118 mL/min/{1.73_m2} (ref >59)
GFR calc non Af Amer: 102 mL/min/{1.73_m2} (ref >59)
GLUCOSE: 83 mg/dL (ref 65–99)
Potassium: 3.8 mmol/L (ref 3.5–5.2)
Sodium: 145 mmol/L — ABNORMAL HIGH (ref 134–144)

## 2017-11-14 ENCOUNTER — Telehealth: Payer: Self-pay | Admitting: Family Medicine

## 2017-11-14 NOTE — Telephone Encounter (Signed)
lmtcb

## 2017-11-26 NOTE — Telephone Encounter (Signed)
lmtcb- no call back- this encounter will be closed.

## 2017-12-03 ENCOUNTER — Telehealth: Payer: Self-pay | Admitting: Family Medicine

## 2017-12-04 MED ORDER — TRAMADOL HCL 50 MG PO TABS: 50.0000 mg | ORAL_TABLET | Freq: Four times a day (QID) | ORAL | 0 refills | Status: DC | PRN

## 2017-12-04 NOTE — Telephone Encounter (Signed)
Sister states she is having back spasms, meloxicam is not helping, it is not enough Did not try cyclobenzaprine read that it would react with her lisinopril Please advise.

## 2017-12-04 NOTE — Telephone Encounter (Signed)
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   Also having severe pain and cannot make it into her wheelchair to be seen.   Has tried meloxicam and not helping. hasn't tried flexeril.  She was concerned about interaction between lisinopril and flexeril. There should not be any significant interaction.   Try flexeril, add tramadol.   Letter to be written.   Laroy Apple, MD [facility] 12/04/2017, 12:52 PM

## 2017-12-04 NOTE — Telephone Encounter (Signed)
Patient aware, mailed letter to patient

## 2017-12-08 ENCOUNTER — Other Ambulatory Visit: Payer: Self-pay | Admitting: Family Medicine

## 2017-12-11 ENCOUNTER — Telehealth: Payer: Self-pay | Admitting: Family Medicine

## 2017-12-11 MED ORDER — IBUPROFEN 400 MG PO TABS: ORAL_TABLET | ORAL | 3 refills | Status: DC

## 2017-12-11 MED ORDER — CYCLOBENZAPRINE HCL 10 MG PO TABS: 10.0000 mg | ORAL_TABLET | Freq: Three times a day (TID) | ORAL | 1 refills | Status: DC | PRN

## 2017-12-11 NOTE — Telephone Encounter (Signed)
Pt aware rx sent over and not to take meloxicam and ibuprofen together.

## 2017-12-11 NOTE — Telephone Encounter (Signed)
Pt having worsening muscle spasms Requesting refill on Ibuprofen and Flexeril Please advise

## 2017-12-11 NOTE — Telephone Encounter (Signed)
Refill sent in. Please make sure she is not taking Mobic and Ibuprofen together. I have D/C mobic on list.

## 2017-12-19 ENCOUNTER — Encounter (HOSPITAL_COMMUNITY): Payer: Self-pay | Admitting: Emergency Medicine

## 2017-12-19 ENCOUNTER — Emergency Department (HOSPITAL_COMMUNITY)
Admission: EM | Admit: 2017-12-19 | Discharge: 2017-12-19 | Disposition: A | Discharge status: Home / Self Care | Payer: Medicare Other | Attending: Emergency Medicine | Admitting: Emergency Medicine

## 2017-12-19 ENCOUNTER — Other Ambulatory Visit: Payer: Self-pay

## 2017-12-19 ENCOUNTER — Telehealth: Payer: Self-pay | Admitting: Family Medicine

## 2017-12-19 ENCOUNTER — Ambulatory Visit: Payer: Medicare Other | Admitting: Family Medicine

## 2017-12-19 DIAGNOSIS — M545 Low back pain, unspecified: Secondary | ICD-10-CM

## 2017-12-19 DIAGNOSIS — Z79899 Other long term (current) drug therapy: Secondary | ICD-10-CM | POA: Insufficient documentation

## 2017-12-19 DIAGNOSIS — G809 Cerebral palsy, unspecified: Secondary | ICD-10-CM | POA: Diagnosis not present

## 2017-12-19 DIAGNOSIS — I10 Essential (primary) hypertension: Secondary | ICD-10-CM | POA: Diagnosis not present

## 2017-12-19 DIAGNOSIS — Z7982 Long term (current) use of aspirin: Secondary | ICD-10-CM | POA: Diagnosis not present

## 2017-12-19 DIAGNOSIS — M5489 Other dorsalgia: Secondary | ICD-10-CM | POA: Diagnosis present

## 2017-12-19 LAB — CBC WITH DIFFERENTIAL/PLATELET
Basophils Absolute: 0 10*3/uL (ref 0.0–0.1)
Basophils Relative: 0 %
EOS ABS: 0.2 10*3/uL (ref 0.0–0.7)
EOS PCT: 2 %
HCT: 39.5 % (ref 36.0–46.0)
Hemoglobin: 12.9 g/dL (ref 12.0–15.0)
LYMPHS ABS: 2.1 10*3/uL (ref 0.7–4.0)
Lymphocytes Relative: 20 %
MCH: 30.4 pg (ref 26.0–34.0)
MCHC: 32.7 g/dL (ref 30.0–36.0)
MCV: 93.2 fL (ref 78.0–100.0)
MONO ABS: 0.6 10*3/uL (ref 0.1–1.0)
MONOS PCT: 6 %
Neutro Abs: 7.6 10*3/uL (ref 1.7–7.7)
Neutrophils Relative %: 72 %
PLATELETS: 409 10*3/uL — AB (ref 150–400)
RBC: 4.24 MIL/uL (ref 3.87–5.11)
RDW: 13.5 % (ref 11.5–15.5)
WBC: 10.6 10*3/uL — ABNORMAL HIGH (ref 4.0–10.5)

## 2017-12-19 LAB — BASIC METABOLIC PANEL
Anion gap: 9 (ref 5–15)
BUN: 15 mg/dL (ref 6–20)
CO2: 28 mmol/L (ref 22–32)
CREATININE: 0.58 mg/dL (ref 0.44–1.00)
Calcium: 9.7 mg/dL (ref 8.9–10.3)
Chloride: 109 mmol/L (ref 98–111)
GFR calc Af Amer: >60 mL/min (ref >60)
GLUCOSE: 96 mg/dL (ref 70–99)
Potassium: 3.2 mmol/L — ABNORMAL LOW (ref 3.5–5.1)
SODIUM: 146 mmol/L — AB (ref 135–145)

## 2017-12-19 LAB — URINALYSIS, ROUTINE W REFLEX MICROSCOPIC
BACTERIA UA: NONE SEEN
BILIRUBIN URINE: NEGATIVE
Glucose, UA: NEGATIVE mg/dL
KETONES UR: NEGATIVE mg/dL
Leukocytes, UA: NEGATIVE
Nitrite: NEGATIVE
PH: 5 (ref 5.0–8.0)
Protein, ur: 100 mg/dL — AB
SPECIFIC GRAVITY, URINE: 1.025 (ref 1.005–1.030)

## 2017-12-19 LAB — LACTIC ACID, PLASMA: LACTIC ACID, VENOUS: 0.7 mmol/L (ref 0.5–1.9)

## 2017-12-19 MED ORDER — DIAZEPAM 5 MG PO TABS: 5.0000 mg | ORAL_TABLET | Freq: Three times a day (TID) | ORAL | 0 refills | Status: DC | PRN

## 2017-12-19 MED ORDER — DIAZEPAM 5 MG PO TABS
5.0000 mg | ORAL_TABLET | Freq: Once | ORAL | Status: AC
  Administered 2017-12-19: 5 mg via ORAL
  Filled 2017-12-19: qty 1

## 2017-12-19 MED ORDER — POTASSIUM CHLORIDE CRYS ER 20 MEQ PO TBCR
20.0000 meq | EXTENDED_RELEASE_TABLET | Freq: Once | ORAL | Status: AC
  Administered 2017-12-19: 20 meq via ORAL
  Filled 2017-12-19: qty 1

## 2017-12-19 NOTE — Discharge Instructions (Signed)
You were evaluated in the emergency department for increased low back spasms.  We checked some blood work and a urinalysis and did not find any other acute medical findings other than your potassium is slightly low.  You were improved with some diazepam and we are prescribing that to go home with.  It will be important for you to follow-up with your primary care doctor.

## 2017-12-19 NOTE — ED Notes (Signed)
Gave patient warm blanket during rounding.

## 2017-12-19 NOTE — ED Triage Notes (Signed)
Patient complaining of back pain x 3 weeks and burning with urination x 1 week. States she has history of back spasms.

## 2017-12-19 NOTE — ED Provider Notes (Signed)
[facility] EMERGENCY DEPARTMENT Provider Note   CSN: [id number] Arrival date & time: 12/19/17  1228     History   Chief Complaint Chief Complaint  Patient presents with  . Back Pain    HPI [name] is a 52 y.o. female.  She has brought in by her sister for evaluation of back spasms.  She has cerebral palsy and is minimally conversant.  Level 5 caveat for difficult communication. She has had back spasms before and they have been trying cyclobenzaprine which usually helps.  It does not seem to be helping the spine.  She also is complaining of some burning with urination.  There is been no fever no vomiting.  The history is provided by the patient and a relative. The history is limited by a developmental delay.  Back Pain   This is a recurrent problem. The current episode started more than 1 week ago. The problem occurs daily. The problem has not changed since onset.The pain is associated with no known injury. The pain is present in the lumbar spine. The quality of the pain is described as cramping. The pain does not radiate. The pain is moderate. The symptoms are aggravated by bending, twisting and certain positions. Associated symptoms include dysuria. Pertinent negatives include no chest pain, no fever and no numbness. She has tried muscle relaxants for the symptoms. The treatment provided mild relief.    Past Medical History:  Diagnosis Date  . Cerebral palsy ([place])   . Complication of anesthesia 12/09/2013   Respiratory instability with 10 mg Propofol.Poor gag reflex.  . Constipation - functional   . Dysphagia 2003   2O to GERD/HH, ?difficulty with large food bolus due to neuromuscular weakness  . Epigastric pain MAY 2012 ABD [place] NL GBLIVPAN   2o to GERD V. NON-ULCER DYSPEPSIA  . GERD (gastroesophageal reflux disease) 2007   BPE NL ESO, REFLUX  . Hiatal hernia   . HIATAL HERNIA 11/23/2008   Qualifier: Diagnosis of  By: Zeb Comfort    . HTN (hypertension)   .  Hyperlipidemia     Patient Active Problem List   Diagnosis Date Noted  . Pre-diabetes 12/24/2016  . Healthcare maintenance 12/26/2014  . Headache 12/26/2014  . Frequency of micturition 09/03/2013  . Need for Tdap vaccination 09/03/2013  . Pain in limb 04/12/2013  . Wart viral 04/12/2013  . HLD (hyperlipidemia) 09/16/2012  . Vitamin D deficiency 09/16/2012  . Hyperglycemia 09/16/2012  . HTN (hypertension) 09/16/2012  . 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 DENTAL PAIN 07/11/2009  . CEREBRAL PALSY 11/23/2008  . GERD 11/23/2008  . Constipation 11/23/2008  . Dysphagia 11/23/2008    Past Surgical History:  Procedure Laterality Date  . COLONOSCOPY  APR 2009 with proprofol   SLF: SIMPLE ADENOMAS  . UPPER GASTROINTESTINAL ENDOSCOPY  MAY 2012 DIL 16 mm   SLF: NO DEFINITE STRICTURE APPRECIATED  . UPPER GASTROINTESTINAL ENDOSCOPY  FEB 2009-HYPOXIA REQUIRING NARCAN, & VERSED, D50V4   SLF: NL ESO, FUNDIC GLAND POLYPS  . UPPER GASTROINTESTINAL ENDOSCOPY  w/ DIL 2003, 2004, 2006     OB History   None      Home Medications    Prior to Admission medications   Medication Sig Start Date End Date Taking? Authorizing Provider  aspirin 81 MG tablet Take 1 tablet (81 mg total) by mouth daily. 02/06/15   Timmothy Euler, MD  cyclobenzaprine (FLEXERIL) 10 MG tablet Take 1 tablet (10 mg total) by mouth 3 (three) times daily  as needed for muscle spasms. 12/11/17   Evelina Dun A, FNP  ibuprofen (ADVIL,MOTRIN) 400 MG tablet TAKE 1 TABLET (600 MG TOTAL) BY MOUTH EVERY 8 (EIGHT) HOURS AS NEEDED FOR PAIN (TAKE WITH MEALS). 12/11/17   Sharion Balloon, FNP  lisinopril (PRINIVIL,ZESTRIL) 40 MG tablet TAKE 1 TABLET BY MOUTH EVERY DAY 12/09/17   Timmothy Euler, MD  megestrol (MEGACE) 40 MG tablet Take 3 tablets every day 06/19/17   Florian Buff, MD  Multiple Vitamin (MULTIVITAMIN) tablet Take 1 tablet by mouth daily.    [provider]  pantoprazole (PROTONIX) 40 MG tablet TAKE 1 TABLET  BY MOUTH 30 MINUTES PRIOR TO BREAKFAST AND SUPPER 10/07/17   Timmothy Euler, MD  pantoprazole (PROTONIX) 40 MG tablet Take 1 tablet (40 mg total) by mouth 2 (two) times daily. 10/07/17   Timmothy Euler, MD  polyethylene glycol powder (GLYCOLAX/MIRALAX) powder MIX 17GRAMS ONCE OR TWICE DAILY 02/17/17   Timmothy Euler, MD  pravastatin (PRAVACHOL) 40 MG tablet Take 1 tablet (40 mg total) by mouth daily. 06/09/17   Timmothy Euler, MD  ranitidine (ZANTAC) 300 MG tablet TAKE 1 TABLET BY MOUTH EVERY MORNING AS NEEDED TO PREVENT HEARTBURN 06/09/17   Timmothy Euler, MD  traMADol (ULTRAM) 50 MG tablet Take 1 tablet (50 mg total) by mouth every 6 (six) hours as needed. 12/04/17   Timmothy Euler, MD    Family History Family History  Problem Relation Age of Onset  . Colon cancer Mother        LATE 50S    Social History Social History   Tobacco Use  . Smoking status: Never Smoker  . Smokeless tobacco: Never Used  Substance Use Topics  . Alcohol use: No  . Drug use: No     Allergies   Hydrocodone   Review of Systems Review of Systems  Unable to perform ROS: Patient nonverbal (limited)  Constitutional: Negative for chills and fever.  HENT: Negative for sore throat.   Eyes: Negative for visual disturbance.  Respiratory: Negative for cough.   Cardiovascular: Negative for chest pain.  Gastrointestinal: Negative for nausea and vomiting.  Genitourinary: Positive for dysuria.  Musculoskeletal: Positive for back pain. Negative for neck pain.  Skin: Negative for rash.  Neurological: Negative for numbness.     Physical Exam Updated Vital Signs BP (!) 118/95 (BP Location: Left Arm)   Pulse (!) 118   Temp 98.6 F (37 C) (Oral)   Resp 20   Ht 5' (1.524 m)   Wt 56.7 kg   SpO2 99%   BMI 24.41 kg/m   Physical Exam  Constitutional: She is oriented to person, place, and time. She appears well-developed and well-nourished.  HENT:  Head: Normocephalic and atraumatic.    Right Ear: External ear normal.  Left Ear: External ear normal.  Nose: Nose normal.  Mouth/Throat: Oropharynx is clear and moist.  Neck: Normal range of motion.  Cardiovascular: Regular rhythm and normal heart sounds. Tachycardia present.  Pulmonary/Chest: Effort normal. No stridor. She has no wheezes.  Abdominal: Soft. She exhibits no mass. There is no guarding.  Musculoskeletal: She exhibits no tenderness or deformity.  She has diffuse tenderness over her lumbar and paralumbar spine.  Neurological: She is alert and oriented to person, place, and time.  Skin: Skin is warm and dry. Capillary refill takes less than 2 seconds.     ED Treatments / Results  Labs (all labs ordered are listed, but only abnormal results  are displayed) Labs Reviewed  CBC WITH DIFFERENTIAL/PLATELET - Abnormal; Notable for the following components:      Result Value   WBC 10.6 (*)    Platelets 409 (*)    All other components within normal limits  BASIC METABOLIC PANEL - Abnormal; Notable for the following components:   Sodium 146 (*)    Potassium 3.2 (*)    All other components within normal limits  URINALYSIS, ROUTINE W REFLEX MICROSCOPIC - Abnormal; Notable for the following components:   Hgb urine dipstick SMALL (*)    Protein, ur 100 (*)    All other components within normal limits  LACTIC ACID, PLASMA    EKG None  Radiology No results found.  Procedures Procedures (including critical care time)  Medications Ordered in ED Medications  diazepam (VALIUM) tablet 5 mg (has no administration in time range)     Initial Impression / Assessment and Plan / ED Course  I have reviewed the triage vital signs and the nursing notes.  Pertinent labs & imaging results that were available during my care of the patient were reviewed by me and considered in my medical decision making (see chart for details).  Clinical Course as of Dec 21 824  Fri Dec 19, 2017  1717 Patient is feeling better with  diazepam.  She was able to give a urine and waiting for that result.   [MB]  1800 Since urinalysis was unremarkable.  We will treat her for musculoskeletal back spasm.   [MB]    Clinical Course User Index [MB] Hayden Rasmussen, MD     Final Clinical Impressions(s) / ED Diagnoses   Final diagnoses:  Acute bilateral low back pain without sciatica    ED Discharge Orders    None       Hayden Rasmussen, MD 12/21/17 [phone]

## 2017-12-19 NOTE — Telephone Encounter (Signed)
Patient was brought in by [name]. She was over 15 min late and I told her I had to ask the nurse/provider if the could see her. Nurse reasoned they could see her @ 2:30 today. She refused and stated that her nerves were bad and that she could not deal with this she was going to take her to the ER and transfer her records and find a new doctors office. She left very angry. She cam back in with in five minutes to sign a records release to transfer care and said she would call back with that name of the new practice. FYI

## 2017-12-24 ENCOUNTER — Other Ambulatory Visit: Payer: Self-pay | Admitting: Family Medicine

## 2017-12-24 NOTE — Telephone Encounter (Signed)
This has never been prescribed here.  This was from an ED physician.  She needs a face to face to discuss refills on this controlled substance.

## 2017-12-24 NOTE — Telephone Encounter (Signed)
Has a appt with new provider DR Lajuana Ripple on 02/09/18 at 800 am   Valium is on med list

## 2017-12-25 ENCOUNTER — Encounter: Payer: Self-pay | Admitting: Family

## 2017-12-25 ENCOUNTER — Ambulatory Visit (INDEPENDENT_AMBULATORY_CARE_PROVIDER_SITE_OTHER): Payer: Medicare Other | Admitting: Family

## 2017-12-25 ENCOUNTER — Telehealth: Payer: Self-pay | Admitting: Family Medicine

## 2017-12-25 VITALS — BP 152/115 | HR 116 | Temp 98.0°F

## 2017-12-25 DIAGNOSIS — G809 Cerebral palsy, unspecified: Secondary | ICD-10-CM | POA: Diagnosis not present

## 2017-12-25 DIAGNOSIS — M5442 Lumbago with sciatica, left side: Secondary | ICD-10-CM

## 2017-12-25 MED ORDER — DICLOFENAC SODIUM 1 % TD GEL: 2.0000 g | Freq: Four times a day (QID) | TRANSDERMAL | 6 refills | Status: DC

## 2017-12-25 MED ORDER — DIAZEPAM 5 MG PO TABS: 5.0000 mg | ORAL_TABLET | Freq: Two times a day (BID) | ORAL | 2 refills | Status: DC | PRN

## 2017-12-25 MED ORDER — LISINOPRIL 40 MG PO TABS: 40.0000 mg | ORAL_TABLET | Freq: Every day | ORAL | 1 refills | Status: DC

## 2017-12-25 NOTE — Progress Notes (Signed)
   Subjective:    Patient ID: [name], female    DOB: 01-05-1966, 52 y.o.   MRN: [id number]  Chief Complaint  Patient presents with  . Back Pain   PT with cerebral palsy presents to the office today with recurrent low back pain and is brought in by her sister who she lives with. She went to the ED on 12/19/17 with low back pain and was given Valium. States this helped. She had a negative urine and stable lab work. 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  Back Pain  This is a recurrent problem. The current episode started 1 to 4 weeks ago. The problem occurs constantly. The problem has been gradually improving since onset. The pain is present in the lumbar spine. The quality of the pain is described as aching. The pain radiates to the left thigh. The pain is at a severity of 10/10. The pain is moderate. The symptoms are aggravated by standing and twisting. Associated symptoms include leg pain and tingling. Risk factors include lack of exercise. She has tried analgesics, bed rest and NSAIDs for the symptoms. The treatment provided mild relief.    [facility] notes reviewed  Review of Systems  Musculoskeletal: Positive for back pain.  Neurological: Positive for tingling.  All other systems reviewed and are negative.      Objective:   Physical Exam  Constitutional: She is oriented to person, place, and time. She appears well-developed and well-nourished. No distress.  HENT:  Head: Normocephalic.  Eyes: Pupils are equal, round, and reactive to light.  Neck: Normal range of motion. Neck supple. No thyromegaly present.  Cardiovascular: Normal rate, regular rhythm, normal heart sounds and intact distal pulses.  No murmur heard. Pulmonary/Chest: Effort normal and breath sounds normal. No respiratory distress. She has no wheezes.  Abdominal: Soft. Bowel sounds are normal. She exhibits no distension. There is no tenderness.  Musculoskeletal: She  exhibits deformity. She exhibits no edema or tenderness.  Neurological: She is alert and oriented to person, place, and time. She has normal reflexes. No cranial nerve deficit.  CP, pt in wheelchair, pain in lumbar and thoracic pain with flexion and extension  Skin: Skin is warm and dry.  Psychiatric: She has a normal mood and affect. Her behavior is normal. Judgment and thought content normal.  Vitals reviewed.     BP (!) 152/115   Pulse (!) 116   Temp 98 F (36.7 C) (Oral)      Assessment & Plan:  [name] comes in today with chief complaint of Back Pain   Diagnosis and orders addressed:  1. Infantile cerebral palsy [facility]) - Ambulatory referral to Orthopedic Surgery - Home Health - Face-to-face encounter (required for Medicare/Medicaid patients)  2. Bilateral low back pain with left-sided sciatica, unspecified chronicity Will refill Valium today, pt reviewed in [place] controlled database. No red flags.  Referral pendings - Ambulatory referral to [place] encounter (required for Medicare/Medicaid patients) - diazepam (VALIUM) 5 MG tablet; Take 1 tablet (5 mg total) by mouth every 12 (twelve) hours as needed for muscle spasms.  Dispense: 60 tablet; Refill: 2 - diclofenac sodium (VOLTAREN) 1 % GEL; Apply 2 g topically 4 (four) times daily.  Dispense: 100 g; Refill: [place], FNP

## 2017-12-25 NOTE — Patient Instructions (Signed)

## 2017-12-25 NOTE — Telephone Encounter (Signed)
Needs to be seen, appointment scheduled today at 5:00 pm with [facility]

## 2017-12-30 DIAGNOSIS — M545 Low back pain: Secondary | ICD-10-CM | POA: Diagnosis not present

## 2018-02-09 ENCOUNTER — Encounter: Payer: Self-pay | Admitting: Family Medicine

## 2018-02-09 ENCOUNTER — Ambulatory Visit (INDEPENDENT_AMBULATORY_CARE_PROVIDER_SITE_OTHER): Payer: Medicare Other | Admitting: Family Medicine

## 2018-02-09 VITALS — BP 162/100 | HR 94 | Temp 97.8°F | Ht 60.0 in

## 2018-02-09 DIAGNOSIS — R5381 Other malaise: Secondary | ICD-10-CM | POA: Diagnosis not present

## 2018-02-09 DIAGNOSIS — G809 Cerebral palsy, unspecified: Secondary | ICD-10-CM | POA: Diagnosis not present

## 2018-02-09 DIAGNOSIS — I10 Essential (primary) hypertension: Secondary | ICD-10-CM | POA: Diagnosis not present

## 2018-02-09 MED ORDER — PANTOPRAZOLE SODIUM 40 MG PO TBEC: DELAYED_RELEASE_TABLET | ORAL | 1 refills | Status: DC

## 2018-02-09 NOTE — Addendum Note (Signed)
Addended by: Antonietta Barcelona D on: 02/09/2018 11:14 AM   Modules accepted: Orders

## 2018-02-09 NOTE — Progress Notes (Signed)
Subjective: CC: HTN PCP: Timmothy Euler, MD XBD:[name] is a 52 y.o. female presenting to clinic today for:  1. HTN/GERD/cerebral palsy Patient was brought to the office by her caregiver who notes that she is always had problems with blood pressure readings.  She thinks this is related to her contracture/cerebral palsy.  At one point, her caregiver notes that the PCP considered increasing her blood pressure medication but after further consideration decided not to because he also believed elevations to be related to her underlying CP.  Her caregiver does report that the patient "eats what she wants" she states that she often does not eat well or enough.  She is not currently on any supplemental drinks.  She does not intake good calcium or vitamin D.  She does occasionally eat cheese.  She is on a multivitamin.  Denies any substernal chest pain, shortness of breath, lower extremity edema.  She does occasionally have burning in her chest related to reflux but this is relieved by her Protonix and ranitidine.  Denies history of aspiration.  She is able to swallow.  She consumes meats.  She goes on to discuss concerns over patient's conditioning.  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  She states that she had a flare of low back pain recently that was quite debilitating.  She thought there is going to be a physical therapy referral placed at that time but they never heard anything.  She saw Dr Gladstone Lighter for the back pain.  She has Valium, Flexeril and Motrin at home if needed for flares.     ROS: Per HPI  Allergies  Allergen Reactions  . Hydrocodone Nausea And Vomiting    Vomiting and hallunications   Past Medical History:  Diagnosis Date  . Cerebral palsy ([place])   . Complication of anesthesia 12/09/2013   Respiratory instability with 10 mg Propofol.Poor gag reflex.  . Constipation - functional   .  Dysphagia 2003   2O to GERD/HH, ?difficulty with large food bolus due to neuromuscular weakness  . Epigastric pain MAY 2012 ABD [place] NL GBLIVPAN   2o to GERD V. NON-ULCER DYSPEPSIA  . GERD (gastroesophageal reflux disease) 2007   BPE NL ESO, REFLUX  . Hiatal hernia   . HIATAL HERNIA 11/23/2008   Qualifier: Diagnosis of  By: Zeb Comfort    . HTN (hypertension)   . Hyperlipidemia     Current Outpatient Medications:  .  cyclobenzaprine (FLEXERIL) 10 MG tablet, Take 1 tablet (10 mg total) by mouth 3 (three) times daily as needed for muscle spasms., Disp: 30 tablet, Rfl: 1 .  diazepam (VALIUM) 5 MG tablet, Take 1 tablet (5 mg total) by mouth every 12 (twelve) hours as needed for muscle spasms., Disp: 60 tablet, Rfl: 2 .  diclofenac sodium (VOLTAREN) 1 % GEL, Apply 2 g topically 4 (four) times daily., Disp: 100 g, Rfl: 6 .  ibuprofen (ADVIL,MOTRIN) 400 MG tablet, TAKE 1 TABLET (600 MG TOTAL) BY MOUTH EVERY 8 (EIGHT) HOURS AS NEEDED FOR PAIN (TAKE WITH MEALS). (Patient taking differently: Take 600 mg by mouth every 8 (eight) hours as needed for mild pain or moderate pain. TAKE 1 TABLET (600 MG TOTAL) BY MOUTH EVERY 8 (EIGHT) HOURS AS NEEDED FOR PAIN (TAKE WITH MEALS).), Disp: 30 tablet, Rfl: 3 .  lisinopril (PRINIVIL,ZESTRIL) 40 MG tablet, Take 1 tablet (40 mg total) by mouth daily.,  Disp: 90 tablet, Rfl: 1 .  megestrol (MEGACE) 40 MG tablet, Take 3 tablets every day (Patient taking differently: Take 60 mg by mouth 2 (two) times daily with a meal. Take 3 tablets every day), Disp: 270 tablet, Rfl: 11 .  Multiple Vitamin (MULTIVITAMIN) tablet, Take 1 tablet by mouth daily., Disp: , Rfl:  .  pantoprazole (PROTONIX) 40 MG tablet, TAKE 1 TABLET BY MOUTH 30 MINUTES PRIOR TO BREAKFAST AND SUPPER (Patient taking differently: Take 40 mg by mouth 2 (two) times daily before a meal. TAKE 1 TABLET BY MOUTH 30 MINUTES PRIOR TO BREAKFAST AND SUPPER), Disp: 180 tablet, Rfl: 1 .  polyethylene glycol powder  (GLYCOLAX/MIRALAX) powder, MIX 17GRAMS ONCE OR TWICE DAILY (Patient taking differently: Take 17 g by mouth daily as needed for mild constipation or moderate constipation. ), Disp: 1054 g, Rfl: 8 .  pravastatin (PRAVACHOL) 40 MG tablet, Take 1 tablet (40 mg total) by mouth daily. (Patient taking differently: Take 40 mg by mouth every evening. ), Disp: 90 tablet, Rfl: 3 .  ranitidine (ZANTAC) 300 MG tablet, TAKE 1 TABLET BY MOUTH EVERY MORNING AS NEEDED TO PREVENT HEARTBURN (Patient taking differently: Take 300 mg by mouth daily as needed for heartburn. TAKE 1 TABLET BY MOUTH EVERY MORNING AS NEEDED TO PREVENT HEARTBURN), Disp: 90 tablet, Rfl: 3 Social History   Socioeconomic History  . Marital status: Single    Spouse name: Not on file  . Number of children: Not on file  . Years of education: Not on file  . Highest education level: Not on file  Occupational History  . Not on file  Social Needs  . Financial resource strain: Not on file  . Food insecurity:    Worry: Not on file    Inability: Not on file  . Transportation needs:    Medical: Not on file    Non-medical: Not on file  Tobacco Use  . Smoking status: Never Smoker  . Smokeless tobacco: Never Used  Substance and Sexual Activity  . Alcohol use: No  . Drug use: No  . Sexual activity: Not Currently    Birth control/protection: None  Lifestyle  . Physical activity:    Days per week: Not on file    Minutes per session: Not on file  . Stress: Not on file  Relationships  . Social connections:    Talks on phone: Not on file    Gets together: Not on file    Attends religious service: Not on file    Active member of club or organization: Not on file    Attends meetings of clubs or organizations: Not on file    Relationship status: Not on file  . Intimate partner violence:    Fear of current or ex partner: Not on file    Emotionally abused: Not on file    Physically abused: Not on file    Forced sexual activity: Not on file    Other Topics Concern  . Not on file  Social History Narrative  . Not on file   Family History  Problem Relation Age of Onset  . Colon cancer Mother        LATE 50S    Objective: Office vital signs reviewed. BP (!) 162/100   Pulse 94   Temp 97.8 F (36.6 C) (Oral)   Ht 5' (1.524 m)   BMI 24.41 kg/m   Physical Examination:  General: Awake, alert, thin, nontoxic, No acute distress Throat: moist mucus membranes Cardio: regular  rate and rhythm, S1S2 heard, no murmurs appreciated Pulm: clear to auscultation bilaterally, no wheezes.  Mild rhonchi on the right. Normal work of breathing on room air Extremities: warm, poor tone in LE bilaterally. Using wheelchair for ambulation; +2 pulses bilaterally Skin: dry; intact; no rashes or lesions Neuro: dependent on wheelchair for ambulation.  Assessment/ Plan: 52 y.o. female   1. Essential hypertension Not controlled.  She is not symptomatic.  I reviewed the chart and it appears that she has had elevation of blood pressures for quite some time.  I am concerned given sedentary lifestyle and poor diet that she may need increase in blood pressure medications.  However, difficult to tell of elevation in blood pressure is related to physical contractures.  I would like her to monitor blood pressures at home.  We will also consider referral to cardiology for their input.  She will follow-up in 3 months for recheck.  If persistently elevated, will discuss possible referral with caregiver.  2. Infantile cerebral palsy ([place]) I placed a referral to physical therapy in [place].  Would like them to focus on strength, transfers and any other deficits that they identified. - Ambulatory referral to Physical Therapy  3. Physical deconditioning - Ambulatory referral to Physical Therapy   Orders Placed This Encounter  Procedures  . Ambulatory referral to Physical Therapy    Referral Priority:   Routine    Referral Type:   Physical Medicine     Referral Reason:   Specialty Services Required    Requested Specialty:   Physical Therapy    Number of Visits Requested:   1   Meds ordered this encounter  Medications  . pantoprazole (PROTONIX) 40 MG tablet    Sig: TAKE 1 TABLET BY MOUTH 30 MINUTES PRIOR TO BREAKFAST AND SUPPER    Dispense:  180 tablet    Refill:  [place], DO [place] [phone]

## 2018-02-27 ENCOUNTER — Telehealth: Payer: Self-pay | Admitting: Family Medicine

## 2018-02-27 DIAGNOSIS — Z1231 Encounter for screening mammogram for malignant neoplasm of breast: Secondary | ICD-10-CM | POA: Diagnosis not present

## 2018-02-27 NOTE — Telephone Encounter (Signed)
Pt aware DR Danice Goltz is off today and that they will get a call early next week with a answer.

## 2018-02-27 NOTE — Telephone Encounter (Signed)
Pt sister [name] has called pt went to have  Mammogram this morning and due to her [place] palsy was not able to get a clear picture due to movement but they did see something on one of the images and they are wanting the pt to have a sedative to try to calm her down enough to relax but not knock her out and do another image, wants to know if we can send one in to pharmacy [facility] is next Thursday

## 2018-03-02 ENCOUNTER — Other Ambulatory Visit: Payer: Self-pay | Admitting: Family Medicine

## 2018-03-02 MED ORDER — LORAZEPAM 0.5 MG PO TABS: ORAL_TABLET | ORAL | 0 refills | Status: DC

## 2018-03-02 NOTE — Telephone Encounter (Signed)
Ativan sent to pharmacy

## 2018-03-02 NOTE — Progress Notes (Signed)
The Narcotic Database has been reviewed.  There were no red flags.   Ativan 0.5 mg since for sedation for mammogram.

## 2018-03-02 NOTE — Telephone Encounter (Signed)
Pt sister aware

## 2018-03-04 ENCOUNTER — Other Ambulatory Visit: Payer: Self-pay

## 2018-03-04 ENCOUNTER — Ambulatory Visit (HOSPITAL_COMMUNITY): Payer: Medicare Other | Attending: Family Medicine

## 2018-03-04 DIAGNOSIS — R293 Abnormal posture: Secondary | ICD-10-CM | POA: Diagnosis not present

## 2018-03-04 DIAGNOSIS — R262 Difficulty in walking, not elsewhere classified: Secondary | ICD-10-CM | POA: Diagnosis not present

## 2018-03-04 DIAGNOSIS — R29898 Other symptoms and signs involving the musculoskeletal system: Secondary | ICD-10-CM | POA: Insufficient documentation

## 2018-03-04 DIAGNOSIS — M6281 Muscle weakness (generalized): Secondary | ICD-10-CM

## 2018-03-04 NOTE — Therapy (Signed)
[place], [place], [address] Phone: [phone]   Fax:  [phone]  Physical Therapy Evaluation  Patient Details  Name: MARENE GILLIAM MRN: [id number] Date of Birth: [birth year] Referring Provider (PT): Janora Norlander, [place]   Encounter Date: 03/04/2018  PT End of Session - 03/04/18 1411    Visit Number  1    Number of Visits  12    Date for PT Re-Evaluation  04/01/18    Authorization Type  [employer] Medicare 100% with or without Medicaid, visits based on medical necessity, PT/SLP combined; secondary Medicaid    Authorization Time Period  cert 67/04/4579 - 99/83/3825    Authorization - Visit Number  1    Authorization - Number of Visits  10    PT Start Time  1117    PT Stop Time  1202    PT Time Calculation (min)  45 min    Equipment Utilized During Treatment  Gait belt    Activity Tolerance  Patient tolerated treatment well    Behavior During Therapy  [facility] for tasks assessed/performed       Past Medical History:  Diagnosis Date  . Cerebral palsy ([place])   . Complication of anesthesia 12/09/2013   Respiratory instability with 10 mg Propofol.Poor gag reflex.  . Constipation - functional   . Dysphagia 2003   2O to GERD/HH, ?difficulty with large food bolus due to neuromuscular weakness  . Epigastric pain MAY 2012 ABD [place] NL GBLIVPAN   2o to GERD V. NON-ULCER DYSPEPSIA  . GERD (gastroesophageal reflux disease) 2007   BPE NL ESO, REFLUX  . Hiatal hernia   . HIATAL HERNIA 11/23/2008   Qualifier: Diagnosis of  By: Zeb Comfort    . HTN (hypertension)   . Hyperlipidemia     Past Surgical History:  Procedure Laterality Date  . COLONOSCOPY  APR 2009 with proprofol   SLF: SIMPLE ADENOMAS  . UPPER GASTROINTESTINAL ENDOSCOPY  MAY 2012 DIL 16 mm   SLF: NO DEFINITE STRICTURE APPRECIATED  . UPPER GASTROINTESTINAL ENDOSCOPY  FEB 2009-HYPOXIA REQUIRING NARCAN, & VERSED, D50V4   SLF: NL ESO, FUNDIC GLAND POLYPS  .  UPPER GASTROINTESTINAL ENDOSCOPY  w/ DIL 2003, 2004, 2006    There were no vitals filed for this visit.   Subjective Assessment - 03/04/18 1126    Subjective  Periodic muscle spasms in her back recently. Went to Orthopedic consult and received a prednisone pack which resolved the back pain for now. Sister has noticed patient hasn't been using her llimbs as much as she was like using her arms to pull her up. So she is requireing more assistance to stand for toileting nad dressing and for transfers. Non-ambulatory, no orthotics. OVer time she was getting weaker, had a daytime assistant that likely was helping too much. Over time she got weaker and then her back started hurting and she got even weaker. Was sleeping on floor, couldn't get to toilet and had to use bed pan.     Patient is accompained by:  Family member   Evette   Pertinent History  infantile cerebral palsy, acid reflux, HLD, on medicine for HTN  (Pended)     Patient Stated Goals  Be able ot use my limbs like I used to.   (Pended)     Currently in Pain?  Yes  (Pended)     Pain Score  5   (Pended)     Pain Location  Back  (  Pended)     Pain Orientation  Mid  (Pended)     Pain Descriptors / Indicators  Aching  (Pended)     Pain Type  Chronic pain  (Pended)          [facility] PT Assessment - 03/04/18 0001      Assessment   Medical Diagnosis  Infantile cerebral palsy, physical deconditioning     Referring Provider (PT)  Ronnie Doss M, DO    Onset Date/Surgical Date  02/09/18    Prior Therapy  Yes      Precautions   Precautions  None      Restrictions   Weight Bearing Restrictions  No      Balance Screen   Has the patient had a decrease in activity level because of a fear of falling?   Yes    Is the patient reluctant to leave their home because of a fear of falling?   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      Cognition   Overall Cognitive Status  Within Functional Limits for tasks assessed      Posture/Postural Control   Posture/Postural Control  Postural limitations    Postural Limitations  Rounded Shoulders;Forward head;Decreased lumbar lordosis;Decreased thoracic kyphosis;Posterior pelvic tilt    Posture Comments  scoliotic curve with right rib hump      Tone   Assessment Location  Right Upper Extremity;Left Upper Extremity;Right Lower Extremity;Left Lower Extremity      ROM / Strength   AROM / PROM / Strength  Strength      Strength   Strength Assessment Site  Hip;Knee;Ankle    Right/Left Hip  Right;Left    Right Hip Flexion  3/5    Right Hip Extension  2+/5   limited by hip flexor contraction   Right Hip ABduction  3-/5    Left Hip Flexion  3/5    Left Hip Extension  2+/5   limited by hip flexor contraction   Left Hip ABduction  3-/5    Right/Left Knee  Right;Left    Right Knee Flexion  3-/5    Right Knee Extension  3+/5   knee bent >25 degrees to stay out of extensor synergy   Left Knee Flexion  3-/5    Left Knee Extension  3+/5   knee bent >25 degrees to stay out of extensor synergy   Right/Left Ankle  Right;Left      Flexibility   Soft Tissue Assessment /Muscle Length  yes    Quadriceps  left/right hip flexor moderately limited; unable to lie fully prone      Bed Mobility   Bed Mobility  Rolling Left;Right Sidelying to Sit;Sit to Sidelying Right    Rolling Left  Minimal Assistance - Patient > 75%    Right Sidelying to Sit  Minimal Assistance - Patient > 75%    Sit to Sidelying Right  Minimal Assistance - Patient > 75%      Transfers    Transfers  Sit to [facility];Floor to Transfer    Sit  to Stand  3: Mod assist    Sit to Stand Details  Visual cues for safe use of DME/AE;Visual cues/gestures for precautions/safety;Verbal cues for sequencing;Verbal cues for precautions/safety;Verbal cues for safe use of DME/AE;Manual facilitation for weight shifting   retropulsion   Stand Pivot Transfers  3: Mod assist    Stand Pivot Transfer Details (indicate cue type and reason)  retropulsion    Floor to Transfer  4: Min assist      Ambulation/Gait   Ambulation/Gait  Yes    Ambulation/Gait Assistance  3: Mod assist    Ambulation/Gait Assistance Details  retropulsion; 2 hands held    Ambulation Distance (Feet)  10 Feet   5' x2   Gait Pattern  Step-to pattern;Decreased step length - right;Decreased step length - left;Decreased stride length;Decreased hip/knee flexion - right;Decreased hip/knee flexion - left;Narrow base of support;Poor foot clearance - left;Poor foot clearance - right    Ambulation Surface  Clinical cytogeneticist  Other (comment)   dependent; has a power wheelchair at home.   [employer]  Independent      RUE Tone   RUE Tone  Moderate;Hypertonic      RUE Tone   Hypertonic Details  more flexor tone      LUE Tone   LUE Tone  Moderate;Hypertonic      LUE Tone   Hypertonic Details  more extensor tone      RLE Tone   RLE Tone  Moderate;Hypertonic      RLE Tone   Hypertonic Details  extensor tone      LLE Tone   LLE Tone  Moderate;Hypertonic      LLE Tone   Hypertonic Details  extensor tone                Objective measurements completed on examination: See above findings.              PT Education - 03/04/18 1409    Education Details  Examination findings and plan of care    Person(s) Educated  Patient;Other (comment)   sister, Evette   Methods  Explanation    Comprehension  Verbalized  understanding       PT Short Term Goals - 03/04/18 1429      PT SHORT TERM GOAL #1   Title  Patient will be independent in initial HEP and perform on a regular basis.     Time  2    Period  Weeks    Status  New    Target Date  03/18/18      PT SHORT TERM GOAL #2   Title  Patient will present with a more erect seated posture with minimal cuing during therapy session.     Baseline  initial - moderate fwd head, rounded shoulders, increased thoracic kyphosis, and decreased lumbar lordosis    Time  2    Period  Weeks    Status  New    Target Date  03/18/18        PT Long Term Goals - 03/04/18 1431      PT LONG TERM GOAL #1   Title  Patient will be independent in performance of advanced HEP and perform on a regular basis to continue improving functional strength beyond discharge from physical therapy.     Time  4    Period  Weeks    Status  New  Target Date  04/01/18      PT LONG TERM GOAL #2   Title  Patient will be able to transfer from floor to bed height with modified independence to indicate improved functional independence and return to PLOF.     Baseline  initial - min assist    Time  4    Period  Weeks    Status  New      PT LONG TERM GOAL #3   Title  Patient will be able to perform sit-to-stand transfer with minimal assistance to indicate improved functional independence and return to PLOF.     Baseline  initial - min/mod assist    Time  4    Period  Weeks    Status  New      PT LONG TERM GOAL #4   Title  Patient will be able to perform stand pivot transfer with minimal assistance to indicate improved functional independence and return to PLOF.    Baseline  initial - min/mod assist    Time  4    Period  Weeks    Status  New    Target Date  04/01/18             Plan - 03/04/18 1413    Clinical Impression Statement  Patient is a 52 year old female with diagnosis infantile cerebral palsy who complains of physical deconditioning and diminished function  requiring additional physical assistance for transfers from floor to bed, sit-to-stand and stand-pivot transfers. Patient is cared for by a personal care assistance and her brother and sister alternate who stays with "Renette" to help her. Patient exhibits extensor tone in bilateral LEs, decreased strength in trunk and bilateral LEs, contraction of bilateral hip flexors, difficulty with bed mobility, transfers and short distance ambulation, and impaired posture. Patient would benefit from skilled physical therapy to address the above deficits and return patient to previous level of function and improve her quality of life.     History and Personal Factors relevant to plan of care:  GERD, HLD, HTN on medicine    Clinical Presentation  Stable    Clinical Presentation due to:  MMT, flexibility, bed mobility, transfers, impaired tone, functional limitiations, postural impairment, clinical judgement    Clinical Decision Making  Low    Rehab Potential  Fair    Clinical Impairments Affecting Rehab Potential  positive - positive outlook, family support; negative - postural impairment, chronicity of LBP and cerebral palsy    PT Frequency  3x / week    PT Duration  4 weeks    PT Treatment/Interventions  ADLs/Self Care Home Management;DME Instruction;Gait training;Functional mobility training;Therapeutic activities;Therapeutic exercise;Balance training;Neuromuscular re-education;Patient/family education;Passive range of motion;Energy conservation;Aquatic Therapy    PT Next Visit Plan  review goals/eval, initiate HEP for trunk and bilat LE strengthening and flexibility; develop HEP for independent performance upon discharge.    PT Home Exercise Plan  initial next session.    Consulted and Agree with Plan of Care  Patient;Family member/caregiver    Family Member Consulted  sister, Evette       Patient will benefit from skilled therapeutic intervention in order to improve the following deficits and impairments:   Abnormal gait, Decreased endurance, Impaired tone, Decreased activity tolerance, Decreased strength, Postural dysfunction, Decreased mobility, Decreased balance, Difficulty walking, Impaired flexibility  Visit Diagnosis: Difficulty in walking, not elsewhere classified  Muscle weakness (generalized)  Abnormal posture  Other symptoms and signs involving the musculoskeletal system     Problem  List Patient Active Problem List   Diagnosis Date Noted  . Pre-diabetes 12/24/2016  . Healthcare maintenance 12/26/2014  . Headache 12/26/2014  . Frequency of micturition 09/03/2013  . Need for Tdap vaccination 09/03/2013  . Pain in limb 04/12/2013  . Wart viral 04/12/2013  . HLD (hyperlipidemia) 09/16/2012  . Vitamin D deficiency 09/16/2012  . Hyperglycemia 09/16/2012  . HTN (hypertension) 09/16/2012  . 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 DENTAL PAIN 07/11/2009  . 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 GERD 11/23/2008  . Constipation 11/23/2008  . Dysphagia 11/23/2008    Floria Raveling. Hartnett-Rands, MS, PT Per Miranda #[id number] 03/04/2018, 2:37 PM  [facility] [address] [place], [place], [address] Phone: [phone]   Fax:  [phone]  Name: SAMARRA RIDGELY MRN: [id number] Date of Birth: 01/13/1966

## 2018-03-10 ENCOUNTER — Encounter (HOSPITAL_COMMUNITY): Payer: Self-pay

## 2018-03-10 ENCOUNTER — Ambulatory Visit (HOSPITAL_COMMUNITY): Payer: Medicare Other

## 2018-03-10 DIAGNOSIS — M6281 Muscle weakness (generalized): Secondary | ICD-10-CM

## 2018-03-10 DIAGNOSIS — R29898 Other symptoms and signs involving the musculoskeletal system: Secondary | ICD-10-CM | POA: Diagnosis not present

## 2018-03-10 DIAGNOSIS — R262 Difficulty in walking, not elsewhere classified: Secondary | ICD-10-CM | POA: Diagnosis not present

## 2018-03-10 DIAGNOSIS — R293 Abnormal posture: Secondary | ICD-10-CM | POA: Diagnosis not present

## 2018-03-10 NOTE — Patient Instructions (Addendum)
Posture - Sitting    Sit upright, head facing forward. Try using a roll to support lower back. Keep shoulders relaxed, and avoid rounded back. Keep hips level with knees. Avoid crossing legs for long periods.  Copyright  VHI. All rights reserved.    DEVELOPMENTAL POSITION: Tall Kneeling    Start with buttocks touching heels. Lift chest and bring hips forward. Squeeze glutes. 10 reps per set, 2sets per day, 4 days per week  Copyright  VHI. All rights reserved.   Bridge    Lie back, legs bent. Inhale, pressing hips up. Keeping ribs in, lengthen lower back. Exhale, rolling down along spine from top. Repeat 10 times. Do 2 sessions per day.  http://pm.exer.us/55   Copyright  VHI. All rights reserved.

## 2018-03-10 NOTE — Therapy (Signed)
[place], [place], [address] Phone: [phone]   Fax:  [phone]  Physical Therapy Treatment  Patient Details  Name: [name] MRN: [id number] Date of Birth: [birth year] Referring Provider (PT): Janora Norlander, [place]   Encounter Date: 03/10/2018  PT End of Session - 03/10/18 1637    Visit Number  2    Number of Visits  12    Date for PT Re-Evaluation  04/01/18    Authorization Type  [employer] Medicare 100% with or without Medicaid, visits based on medical necessity, PT/SLP combined; secondary Medicaid    Authorization Time Period  cert 32/95/1884 - 16/60/6301    Authorization - Visit Number  2    Authorization - Number of Visits  10    PT Start Time  1609    PT Stop Time  1648    PT Time Calculation (min)  39 min    Activity Tolerance  Patient tolerated treatment well    Behavior During Therapy  [facility] for tasks assessed/performed       Past Medical History:  Diagnosis Date  . Cerebral palsy ([place])   . Complication of anesthesia 12/09/2013   Respiratory instability with 10 mg Propofol.Poor gag reflex.  . Constipation - functional   . Dysphagia 2003   2O to GERD/HH, ?difficulty with large food bolus due to neuromuscular weakness  . Epigastric pain MAY 2012 ABD [place] NL GBLIVPAN   2o to GERD V. NON-ULCER DYSPEPSIA  . GERD (gastroesophageal reflux disease) 2007   BPE NL ESO, REFLUX  . Hiatal hernia   . HIATAL HERNIA 11/23/2008   Qualifier: Diagnosis of  By: Zeb Comfort    . HTN (hypertension)   . Hyperlipidemia     Past Surgical History:  Procedure Laterality Date  . COLONOSCOPY  APR 2009 with proprofol   SLF: SIMPLE ADENOMAS  . UPPER GASTROINTESTINAL ENDOSCOPY  MAY 2012 DIL 16 mm   SLF: NO DEFINITE STRICTURE APPRECIATED  . UPPER GASTROINTESTINAL ENDOSCOPY  FEB 2009-HYPOXIA REQUIRING NARCAN, & VERSED, D50V4   SLF: NL ESO, FUNDIC GLAND POLYPS  . UPPER GASTROINTESTINAL ENDOSCOPY  w/ DIL 2003, 2004,  2006    There were no vitals filed for this visit.  Subjective Assessment - 03/10/18 1613    Subjective  Pt arrived with family.  reports she has some LBP pain scale 5/10 today.      Currently in Pain?  Yes    Pain Score  5     Pain Location  Back    Pain Orientation  Lower    Pain Descriptors / Indicators  Aching    Pain Onset  More than a month ago    Pain Frequency  Constant    Aggravating Factors   movements                       [facility] Adult PT Treatment/Exercise - 03/10/18 0001      Bed Mobility   Bed Mobility  Rolling Left;Right Sidelying to Sit;Sit to Sidelying Right    Rolling Left  Minimal Assistance - Patient > 75%    Right Sidelying to Sit  Minimal Assistance - Patient > 75%    Sit to Sidelying Right  Minimal Assistance - Patient > 75%      Transfers   Transfers  Sit to [facility] Transfers    Sit to Stand  3: Mod assist    Sit to Stand Details  Visual cues for safe use of DME/AE;Visual cues/gestures for precautions/safety;Verbal cues for sequencing;Verbal cues for precautions/safety;Verbal cues for safe use of DME/AE;Manual facilitation for weight shifting    Stand Pivot Transfers  3: Mod assist    Stand Pivot Transfer Details (indicate cue type and reason)  retropulsion      Ambulation/Gait   Gait Comments  sidestep attempted infront of mat      Posture/Postural Control   Posture/Postural Control  Postural limitations    Postural Limitations  Rounded Shoulders;Forward head;Decreased lumbar lordosis;Decreased thoracic kyphosis;Posterior pelvic tilt    Posture Comments  scoliotic curve with right rib hump      Exercises   Exercises  Knee/Hip      Knee/Hip Exercises: Supine   Bridges  10 reps    Bridges Limitations  manual assistance required for positioning      Knee/Hip Exercises: Prone   Other Prone Exercises  prone position to quadruped    Other Prone Exercises  quadruped walking forward<-->backwards; quadruped to tall kneeling                PT Short Term Goals - 03/04/18 1429      PT SHORT TERM GOAL #1   Title  Patient will be independent in initial HEP and perform on a regular basis.     Time  2    Period  Weeks    Status  New    Target Date  03/18/18      PT SHORT TERM GOAL #2   Title  Patient will present with a more erect seated posture with minimal cuing during therapy session.     Baseline  initial - moderate fwd head, rounded shoulders, increased thoracic kyphosis, and decreased lumbar lordosis    Time  2    Period  Weeks    Status  New    Target Date  03/18/18        PT Long Term Goals - 03/04/18 1431      PT LONG TERM GOAL #1   Title  Patient will be independent in performance of advanced HEP and perform on a regular basis to continue improving functional strength beyond discharge from physical therapy.     Time  4    Period  Weeks    Status  New    Target Date  04/01/18      PT LONG TERM GOAL #2   Title  Patient will be able to transfer from floor to bed height with modified independence to indicate improved functional independence and return to PLOF.     Baseline  initial - min assist    Time  4    Period  Weeks    Status  New      PT LONG TERM GOAL #3   Title  Patient will be able to perform sit-to-stand transfer with minimal assistance to indicate improved functional independence and return to PLOF.     Baseline  initial - min/mod assist    Time  4    Period  Weeks    Status  New      PT LONG TERM GOAL #4   Title  Patient will be able to perform stand pivot transfer with minimal assistance to indicate improved functional independence and return to PLOF.    Baseline  initial - min/mod assist    Time  4    Period  Weeks    Status  New    Target Date  04/01/18  Plan - 03/10/18 1847    Clinical Impression Statement  Pt arrived with her sister Evette who sat through session to assist with verbalized understanding and to ensure compliance iwht HEP.  Began  session reviewing goals and session focused with therapeutic activities to improve transfers and core strengthening for balance.  Pt able to follow all commands with understanding, increased time to complete due to weakness and extensor tone restricting some movements.  Pt able to position quadruped with min A requied due to tone.  Able to complete tall kneeling with increased assistance required due to weakness and tone.  Established HEP, pt. and sister verbalized understanding with all exercises and printout given.    Rehab Potential  Fair    Clinical Impairments Affecting Rehab Potential  positive - positive outlook, family support; negative - postural impairment, chronicity of LBP and cerebral palsy    PT Frequency  3x / week    PT Duration  4 weeks    PT Treatment/Interventions  ADLs/Self Care Home Management;DME Instruction;Gait training;Functional mobility training;Therapeutic activities;Therapeutic exercise;Balance training;Neuromuscular re-education;Patient/family education;Passive range of motion;Energy conservation;Aquatic Therapy    PT Next Visit Plan  Review compliance and form wiht HEP, initiate HEP for trunk and bilat LE strengthening and flexibility; develop HEP for independent performance upon discharge.    PT Home Exercise Plan  10/29: quadruped to tall kneeling, bridges and seated posture.      Consulted and Agree with Plan of Care  Patient;Family member/caregiver    Family Member Consulted  sister, Evette       Patient will benefit from skilled therapeutic intervention in order to improve the following deficits and impairments:  Abnormal gait, Decreased endurance, Impaired tone, Decreased activity tolerance, Decreased strength, Postural dysfunction, Decreased mobility, Decreased balance, Difficulty walking, Impaired flexibility  Visit Diagnosis: Difficulty in walking, not elsewhere classified  Muscle weakness (generalized)  Abnormal posture  Other symptoms and signs  involving the musculoskeletal system     Problem List Patient Active Problem List   Diagnosis Date Noted  . Pre-diabetes 12/24/2016  . Healthcare maintenance 12/26/2014  . Headache 12/26/2014  . Frequency of micturition 09/03/2013  . Need for Tdap vaccination 09/03/2013  . Pain in limb 04/12/2013  . Wart viral 04/12/2013  . HLD (hyperlipidemia) 09/16/2012  . Vitamin D deficiency 09/16/2012  . Hyperglycemia 09/16/2012  . HTN (hypertension) 09/16/2012  . 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 DENTAL PAIN 07/11/2009  . 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 GERD 11/23/2008  . Constipation 11/23/2008  . Dysphagia 11/23/2008   Ihor Austin, [place]; [place]  Aldona Lento 03/10/2018, 6:59 PM  [facility] [address] [place], [place], [address] Phone: [phone]   Fax:  [phone]  Name: [name] MRN: [id number] Date of Birth: March 06, 1966

## 2018-03-11 ENCOUNTER — Ambulatory Visit (HOSPITAL_COMMUNITY): Payer: Medicare Other

## 2018-03-11 ENCOUNTER — Encounter (HOSPITAL_COMMUNITY): Payer: Self-pay

## 2018-03-11 DIAGNOSIS — R293 Abnormal posture: Secondary | ICD-10-CM

## 2018-03-11 DIAGNOSIS — R29898 Other symptoms and signs involving the musculoskeletal system: Secondary | ICD-10-CM

## 2018-03-11 DIAGNOSIS — M6281 Muscle weakness (generalized): Secondary | ICD-10-CM

## 2018-03-11 DIAGNOSIS — R262 Difficulty in walking, not elsewhere classified: Secondary | ICD-10-CM

## 2018-03-11 NOTE — Therapy (Signed)
[facility] [place], [place], [address] Phone: [phone]   Fax:  [phone]  Physical Therapy Treatment  Patient Details  Name: [name] MRN: [id number] Date of Birth: [birth year] Referring Provider (PT): Janora Norlander, [place]   Encounter Date: 03/11/2018  PT End of Session - 03/11/18 1210    Visit Number  3    Number of Visits  12    Date for PT Re-Evaluation  04/01/18    Authorization Type  [employer] Medicare 100% with or without Medicaid, visits based on medical necessity, PT/SLP combined; secondary Medicaid    Authorization Time Period  cert 36/14/4315 - 40/12/6759    Authorization - Visit Number  3    Authorization - Number of Visits  10    PT Start Time  9509    PT Stop Time  1208    PT Time Calculation (min)  42 min    Equipment Utilized During Treatment  Gait belt    Activity Tolerance  Patient tolerated treatment well    Behavior During Therapy  WFL for tasks assessed/performed       Past Medical History:  Diagnosis Date  . Cerebral palsy ([place])   . Complication of anesthesia 12/09/2013   Respiratory instability with 10 mg Propofol.Poor gag reflex.  . Constipation - functional   . Dysphagia 2003   2O to GERD/HH, ?difficulty with large food bolus due to neuromuscular weakness  . Epigastric pain MAY 2012 ABD [place] NL GBLIVPAN   2o to GERD V. NON-ULCER DYSPEPSIA  . GERD (gastroesophageal reflux disease) 2007   BPE NL ESO, REFLUX  . Hiatal hernia   . HIATAL HERNIA 11/23/2008   Qualifier: Diagnosis of  By: Zeb Comfort    . HTN (hypertension)   . Hyperlipidemia     Past Surgical History:  Procedure Laterality Date  . COLONOSCOPY  APR 2009 with proprofol   SLF: SIMPLE ADENOMAS  . UPPER GASTROINTESTINAL ENDOSCOPY  MAY 2012 DIL 16 mm   SLF: NO DEFINITE STRICTURE APPRECIATED  . UPPER GASTROINTESTINAL ENDOSCOPY  FEB 2009-HYPOXIA REQUIRING NARCAN, & VERSED, D50V4   SLF: NL ESO, FUNDIC GLAND POLYPS  . UPPER  GASTROINTESTINAL ENDOSCOPY  w/ DIL 2003, 2004, 2006    There were no vitals filed for this visit.  Subjective Assessment - 03/11/18 1211    Subjective  Pt arrived with sister, stated she has began the crawling to tall kneeling exercise at home.  No reports of pain today.      Currently in Pain?  No/denies                       [facility] Adult PT Treatment/Exercise - 03/11/18 0001      Bed Mobility   Bed Mobility  Rolling Left;Right Sidelying to Sit;Sit to Sidelying Right    Rolling Left  Minimal Assistance - Patient > 75%    Right Sidelying to Sit  Minimal Assistance - Patient > 75%    Sit to Sidelying Right  Minimal Assistance - Patient > 75%      Transfers   Transfers  Sit to [employer] Transfers    Sit to Stand  3: Mod assist    Sit to Stand Details  Visual cues for safe use of DME/AE;Visual cues/gestures for precautions/safety;Verbal cues for sequencing;Verbal cues for precautions/safety;Verbal cues for safe use of DME/AE;Manual facilitation for weight shifting    Stand Pivot Transfers  3: Mod assist  Stand Pivot Transfer Details (indicate cue type and reason)  retropulsion      Posture/Postural Control   Posture/Postural Control  Postural limitations    Postural Limitations  Rounded Shoulders;Forward head;Decreased lumbar lordosis;Decreased thoracic kyphosis;Posterior pelvic tilt    Posture Comments  scoliotic curve with right rib hump      Exercises   Exercises  Knee/Hip      Knee/Hip Exercises: Supine   Bridges  10 reps    Bridges Limitations  manual assistance required for positioning    Other Supine Knee/Hip Exercises  BLE PNF      Knee/Hip Exercises: Prone   Other Prone Exercises  prone position to quadruped; AA UE then LE lifts 5x each    Other Prone Exercises  quadruped walking forward<-->backwards; quadruped to tall kneeling 4x 30" tall kneeling with ab set               PT Short Term Goals - 03/04/18 1429      PT SHORT TERM GOAL  #1   Title  Patient will be independent in initial HEP and perform on a regular basis.     Time  2    Period  Weeks    Status  New    Target Date  03/18/18      PT SHORT TERM GOAL #2   Title  Patient will present with a more erect seated posture with minimal cuing during therapy session.     Baseline  initial - moderate fwd head, rounded shoulders, increased thoracic kyphosis, and decreased lumbar lordosis    Time  2    Period  Weeks    Status  New    Target Date  03/18/18        PT Long Term Goals - 03/04/18 1431      PT LONG TERM GOAL #1   Title  Patient will be independent in performance of advanced HEP and perform on a regular basis to continue improving functional strength beyond discharge from physical therapy.     Time  4    Period  Weeks    Status  New    Target Date  04/01/18      PT LONG TERM GOAL #2   Title  Patient will be able to transfer from floor to bed height with modified independence to indicate improved functional independence and return to PLOF.     Baseline  initial - min assist    Time  4    Period  Weeks    Status  New      PT LONG TERM GOAL #3   Title  Patient will be able to perform sit-to-stand transfer with minimal assistance to indicate improved functional independence and return to PLOF.     Baseline  initial - min/mod assist    Time  4    Period  Weeks    Status  New      PT LONG TERM GOAL #4   Title  Patient will be able to perform stand pivot transfer with minimal assistance to indicate improved functional independence and return to PLOF.    Baseline  initial - min/mod assist    Time  4    Period  Weeks    Status  New    Target Date  04/01/18            Plan - 03/11/18 1215    Clinical Impression Statement  Session focus on transfer training and core strengthening for balance.  Mainly verbal and tactile cueing to assist with transitions from sitting to prone --> quadruped with min A.  Pt able to acheive tall kneeling with  minimal assistance, increased hold times to 30" holds for core strengthening.  Added quadruped UE then LE lifts for core strengthening.  Added LE PNF patterns to address extensor tones.  EOS pt limited by fatigue, no reports of pain.      Rehab Potential  Fair    Clinical Impairments Affecting Rehab Potential  positive - positive outlook, family support; negative - postural impairment, chronicity of LBP and cerebral palsy    PT Frequency  3x / week    PT Duration  4 weeks    PT Treatment/Interventions  ADLs/Self Care Home Management;DME Instruction;Gait training;Functional mobility training;Therapeutic activities;Therapeutic exercise;Balance training;Neuromuscular re-education;Patient/family education;Passive range of motion;Energy conservation;Aquatic Therapy    PT Next Visit Plan  Add dynamic sitting activities next session and continues wiht therex for trunk and bilat LE strengthening and flexibility; develop HEP for independent performance upon discharge.    PT Home Exercise Plan  10/29: quadruped to tall kneeling, bridges and seated posture.      Family Member Consulted  sister, [name]       Patient will benefit from skilled therapeutic intervention in order to improve the following deficits and impairments:  Abnormal gait, Decreased endurance, Impaired tone, Decreased activity tolerance, Decreased strength, Postural dysfunction, Decreased mobility, Decreased balance, Difficulty walking, Impaired flexibility  Visit Diagnosis: Difficulty in walking, not elsewhere classified  Muscle weakness (generalized)  Abnormal posture  Other symptoms and signs involving the musculoskeletal system     Problem List Patient Active Problem List   Diagnosis Date Noted  . Pre-diabetes 12/24/2016  . Healthcare maintenance 12/26/2014  . Headache 12/26/2014  . Frequency of micturition 09/03/2013  . Need for Tdap vaccination 09/03/2013  . Pain in limb 04/12/2013  . Wart viral 04/12/2013  . HLD  (hyperlipidemia) 09/16/2012  . Vitamin D deficiency 09/16/2012  . Hyperglycemia 09/16/2012  . HTN (hypertension) 09/16/2012  . 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 DENTAL PAIN 07/11/2009  . Infantile cerebral palsy ([place]) 11/23/2008  . GERD 11/23/2008  . Constipation 11/23/2008  . Dysphagia 11/23/2008   Ihor Austin, [place]; [place]  Aldona Lento 03/11/2018, 12:23 PM  [facility] [address] [place], [place], [address] Phone: [phone]   Fax:  [phone]  Name: [name] MRN: [id number] Date of Birth: 11-19-1965

## 2018-03-12 ENCOUNTER — Telehealth (HOSPITAL_COMMUNITY): Payer: Self-pay | Admitting: Family Medicine

## 2018-03-12 ENCOUNTER — Ambulatory Visit (HOSPITAL_COMMUNITY): Payer: Medicare Other

## 2018-03-12 ENCOUNTER — Telehealth: Payer: Self-pay | Admitting: Family Medicine

## 2018-03-12 DIAGNOSIS — N6311 Unspecified lump in the right breast, upper outer quadrant: Secondary | ICD-10-CM | POA: Diagnosis not present

## 2018-03-12 DIAGNOSIS — N6489 Other specified disorders of breast: Secondary | ICD-10-CM | POA: Diagnosis not present

## 2018-03-12 DIAGNOSIS — R921 Mammographic calcification found on diagnostic imaging of breast: Secondary | ICD-10-CM | POA: Diagnosis not present

## 2018-03-12 NOTE — Telephone Encounter (Signed)
03/12/18  caller said that she had a mammogram this morning and just went thru alot and in a lot of pain

## 2018-03-16 NOTE — Telephone Encounter (Signed)
She was prescribed this on 12/25/2017 w/ 2 refills.  Please have her contact her Pharmacy for a refill.

## 2018-03-16 NOTE — Telephone Encounter (Signed)
Checked with pharmacy and she has two RFs on Valium. Notified caregiver and she verbalized understanding

## 2018-03-17 ENCOUNTER — Other Ambulatory Visit: Payer: Self-pay | Admitting: Family Medicine

## 2018-03-17 ENCOUNTER — Ambulatory Visit (HOSPITAL_COMMUNITY): Payer: Medicare Other | Attending: Family Medicine

## 2018-03-17 ENCOUNTER — Encounter (HOSPITAL_COMMUNITY): Payer: Self-pay

## 2018-03-17 DIAGNOSIS — R293 Abnormal posture: Secondary | ICD-10-CM | POA: Diagnosis not present

## 2018-03-17 DIAGNOSIS — R29898 Other symptoms and signs involving the musculoskeletal system: Secondary | ICD-10-CM | POA: Diagnosis not present

## 2018-03-17 DIAGNOSIS — M6281 Muscle weakness (generalized): Secondary | ICD-10-CM | POA: Diagnosis not present

## 2018-03-17 DIAGNOSIS — R262 Difficulty in walking, not elsewhere classified: Secondary | ICD-10-CM

## 2018-03-17 NOTE — Telephone Encounter (Signed)
Pt's sister notified RX is at [facility] Confirmed with pharmacist

## 2018-03-17 NOTE — Therapy (Addendum)
[facility], [place], [address] Phone: [phone]   Fax:  [phone]  Physical Therapy Treatment  Patient Details  Name: [name] MRN: [id number] Date of Birth: 07-12-1965 Referring Provider (PT): Ronnie Doss M, [place]  PHYSICAL THERAPY DISCHARGE SUMMARY  04/24/2018 Visits from Start of Care: 4  Current functional level related to goals / functional outcomes: Patient has undergone bilateral breast surgery since her last visit to PT.    Remaining deficits: Core/trunk and LE strength.    Education / Equipment: Instructed in HEP.  Plan: Patient agrees to discharge.  Patient goals were not met. Patient is being discharged due to not returning since the last visit.  ?????    Floria Raveling. Hartnett-Rands, MS, PT [facility] #[id number] 04/24/2018   Encounter Date: 03/17/2018  PT End of Session - 03/17/18 1648    Visit Number  4    Number of Visits  12    Date for PT Re-Evaluation  04/01/18    Authorization Type  [employer]HC Medicare 100% with or without Medicaid, visits based on medical necessity, PT/SLP combined; secondary Medicaid    Authorization Time Period  cert 00/17/4944 - 96/75/9163    Authorization - Visit Number  4    Authorization - Number of Visits  10    PT Start Time  8466    PT Stop Time  1648    PT Time Calculation (min)  44 min    Equipment Utilized During Treatment  Gait belt    Activity Tolerance  Patient tolerated treatment well    Behavior During Therapy  WFL for tasks assessed/performed       Past Medical History:  Diagnosis Date  . Cerebral palsy ([place])   . Complication of anesthesia 12/09/2013   Respiratory instability with 10 mg Propofol.Poor gag reflex.  . Constipation - functional   . Dysphagia 2003   2O to GERD/HH, ?difficulty with large food bolus due to neuromuscular weakness  . Epigastric pain MAY 2012 ABD [place] NL GBLIVPAN   2o to GERD V. NON-ULCER  DYSPEPSIA  . GERD (gastroesophageal reflux disease) 2007   BPE NL ESO, REFLUX  . Hiatal hernia   . HIATAL HERNIA 11/23/2008   Qualifier: Diagnosis of  By: Zeb Comfort    . HTN (hypertension)   . Hyperlipidemia     Past Surgical History:  Procedure Laterality Date  . COLONOSCOPY  APR 2009 with proprofol   SLF: SIMPLE ADENOMAS  . UPPER GASTROINTESTINAL ENDOSCOPY  MAY 2012 DIL 16 mm   SLF: NO DEFINITE STRICTURE APPRECIATED  . UPPER GASTROINTESTINAL ENDOSCOPY  FEB 2009-HYPOXIA REQUIRING NARCAN, & VERSED, D50V4   SLF: NL ESO, FUNDIC GLAND POLYPS  . UPPER GASTROINTESTINAL ENDOSCOPY  w/ DIL 2003, 2004, 2006    There were no vitals filed for this visit.  Subjective Assessment - 03/17/18 1607    Subjective  Sister stated pt has apt for biopsy tomorrow, cancelled last apt for mammogram.  No reports of pain today.    Currently in Pain?  No/denies                       [facility] Adult PT Treatment/Exercise - 03/17/18 0001      Bed Mobility   Bed Mobility  Rolling Left;Right Sidelying to Sit;Sit to Sidelying Right    Rolling Left  Minimal Assistance - Patient > 75%    Right Sidelying to Sit  Minimal  Assistance - Patient > 75%    Sit to Sidelying Right  Minimal Assistance - Patient > 75%      Transfers   Transfers  Sit to [employer] Transfers    Sit to Stand  3: Mod assist    Sit to Stand Details  Visual cues for safe use of DME/AE;Visual cues/gestures for precautions/safety;Verbal cues for sequencing;Verbal cues for precautions/safety;Verbal cues for safe use of DME/AE;Manual facilitation for weight shifting    Stand Pivot Transfers  3: Mod assist    Stand Pivot Transfer Details (indicate cue type and reason)  retropulsion      Posture/Postural Control   Posture/Postural Control  Postural limitations    Postural Limitations  Rounded Shoulders;Forward head;Decreased lumbar lordosis;Decreased thoracic kyphosis;Posterior pelvic tilt    Posture Comments  scoliotic  curve with right rib hump      Exercises   Exercises  Knee/Hip      Knee/Hip Exercises: Seated   Other Seated Knee/Hip Exercises  Sitting on dynadisc 2x 1'      Knee/Hip Exercises: Prone   Other Prone Exercises  prone position to quadruped; AA UE then LE lifts 5x each    Other Prone Exercises  quadruped walking forward<-->backwards; quadruped to tall kneeling 2x 1' tall kneeling with ab set; attempted tall kneeling walking               PT Short Term Goals - 03/04/18 1429      PT SHORT TERM GOAL #1   Title  Patient will be independent in initial HEP and perform on a regular basis.     Time  2    Period  Weeks    Status  New    Target Date  03/18/18      PT SHORT TERM GOAL #2   Title  Patient will present with a more erect seated posture with minimal cuing during therapy session.     Baseline  initial - moderate fwd head, rounded shoulders, increased thoracic kyphosis, and decreased lumbar lordosis    Time  2    Period  Weeks    Status  New    Target Date  03/18/18        PT Long Term Goals - 03/04/18 1431      PT LONG TERM GOAL #1   Title  Patient will be independent in performance of advanced HEP and perform on a regular basis to continue improving functional strength beyond discharge from physical therapy.     Time  4    Period  Weeks    Status  New    Target Date  04/01/18      PT LONG TERM GOAL #2   Title  Patient will be able to transfer from floor to bed height with modified independence to indicate improved functional independence and return to PLOF.     Baseline  initial - min assist    Time  4    Period  Weeks    Status  New      PT LONG TERM GOAL #3   Title  Patient will be able to perform sit-to-stand transfer with minimal assistance to indicate improved functional independence and return to PLOF.     Baseline  initial - min/mod assist    Time  4    Period  Weeks    Status  New      PT LONG TERM GOAL #4   Title  Patient will be able to  perform  stand pivot transfer with minimal assistance to indicate improved functional independence and return to PLOF.    Baseline  initial - min/mod assist    Time  4    Period  Weeks    Status  New    Target Date  04/01/18            Plan - 03/17/18 1849    Clinical Impression Statement  Continued session focus with transfer training, mobility and core strengthening for balance.  Pt presented wiht improved core stability wtih increased timing iwht tall kneeling, attempted tall kneeling walking but unable to do tone and LE weakness.  Added sitting on dynadisc for core strengthening with min A to complete.  Pt limited by fatigue, no pain through session.      Rehab Potential  Fair    Clinical Impairments Affecting Rehab Potential  positive - positive outlook, family support; negative - postural impairment, chronicity of LBP and cerebral palsy    PT Frequency  3x / week    PT Duration  4 weeks    PT Treatment/Interventions  ADLs/Self Care Home Management;DME Instruction;Gait training;Functional mobility training;Therapeutic activities;Therapeutic exercise;Balance training;Neuromuscular re-education;Patient/family education;Passive range of motion;Energy conservation;Aquatic Therapy    PT Next Visit Plan  Continue wiht therex for trunk and bilat LE strengthening and flexibility; develop HEP for independent performance upon discharge.    PT Home Exercise Plan  10/29: quadruped to tall kneeling, bridges and seated posture.      Family Member Consulted  sister, York Grice       Patient will benefit from skilled therapeutic intervention in order to improve the following deficits and impairments:  Abnormal gait, Decreased endurance, Impaired tone, Decreased activity tolerance, Decreased strength, Postural dysfunction, Decreased mobility, Decreased balance, Difficulty walking, Impaired flexibility  Visit Diagnosis: Difficulty in walking, not elsewhere classified  Muscle weakness  (generalized)  Abnormal posture  Other symptoms and signs involving the musculoskeletal system     Problem List Patient Active Problem List   Diagnosis Date Noted  . Pre-diabetes 12/24/2016  . Healthcare maintenance 12/26/2014  . Headache 12/26/2014  . Frequency of micturition 09/03/2013  . Need for Tdap vaccination 09/03/2013  . Pain in limb 04/12/2013  . Wart viral 04/12/2013  . HLD (hyperlipidemia) 09/16/2012  . Vitamin D deficiency 09/16/2012  . Hyperglycemia 09/16/2012  . HTN (hypertension) 09/16/2012  . 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 DENTAL PAIN 07/11/2009  . Infantile cerebral palsy ([place]) 11/23/2008  . GERD 11/23/2008  . Constipation 11/23/2008  . Dysphagia 11/23/2008   Ihor Austin, [place]; [place]  Aldona Lento 03/17/2018, 6:51 PM  [facility] [address] [place], [place], [address] Phone: [phone]   Fax:  [phone]  Name: IRIANA ARTLEY MRN: [id number] Date of Birth: 1965/09/10

## 2018-03-18 ENCOUNTER — Other Ambulatory Visit: Payer: Self-pay | Admitting: Radiology

## 2018-03-18 ENCOUNTER — Ambulatory Visit (HOSPITAL_COMMUNITY): Payer: Medicare Other

## 2018-03-18 DIAGNOSIS — N6325 Unspecified lump in the left breast, overlapping quadrants: Secondary | ICD-10-CM | POA: Diagnosis not present

## 2018-03-18 DIAGNOSIS — N6311 Unspecified lump in the right breast, upper outer quadrant: Secondary | ICD-10-CM | POA: Diagnosis not present

## 2018-03-18 DIAGNOSIS — Z170 Estrogen receptor positive status [ER+]: Secondary | ICD-10-CM | POA: Diagnosis not present

## 2018-03-18 DIAGNOSIS — N6012 Diffuse cystic mastopathy of left breast: Secondary | ICD-10-CM | POA: Diagnosis not present

## 2018-03-18 DIAGNOSIS — C50411 Malignant neoplasm of upper-outer quadrant of right female breast: Secondary | ICD-10-CM | POA: Diagnosis not present

## 2018-03-20 ENCOUNTER — Telehealth (HOSPITAL_COMMUNITY): Payer: Self-pay | Admitting: Family Medicine

## 2018-03-20 ENCOUNTER — Ambulatory Visit (HOSPITAL_COMMUNITY): Payer: Medicare Other

## 2018-03-20 NOTE — Telephone Encounter (Signed)
03/20/18  caller asked that we cancel all appointments... patient was diagnosed yesterday with breast cancer and therapy will have to be on hold for a while

## 2018-03-23 ENCOUNTER — Ambulatory Visit (HOSPITAL_COMMUNITY): Payer: Medicare Other | Admitting: Physical Therapy

## 2018-03-23 ENCOUNTER — Telehealth: Payer: Self-pay | Admitting: Hematology and Oncology

## 2018-03-23 ENCOUNTER — Encounter: Payer: Self-pay | Admitting: *Deleted

## 2018-03-23 DIAGNOSIS — C50411 Malignant neoplasm of upper-outer quadrant of right female breast: Secondary | ICD-10-CM

## 2018-03-23 DIAGNOSIS — Z170 Estrogen receptor positive status [ER+]: Principal | ICD-10-CM

## 2018-03-23 NOTE — Telephone Encounter (Signed)
Spoke to patients care giver to confirm morning [facility] appointment for 11/13, packet emailed to [employer]

## 2018-03-25 ENCOUNTER — Encounter: Payer: Self-pay | Admitting: Physical Therapy

## 2018-03-25 ENCOUNTER — Other Ambulatory Visit: Payer: Self-pay | Admitting: *Deleted

## 2018-03-25 ENCOUNTER — Ambulatory Visit: Payer: Medicare Other | Attending: Surgery | Admitting: Physical Therapy

## 2018-03-25 ENCOUNTER — Ambulatory Visit (HOSPITAL_COMMUNITY): Payer: Medicare Other

## 2018-03-25 ENCOUNTER — Telehealth: Payer: Self-pay | Admitting: Hematology and Oncology

## 2018-03-25 ENCOUNTER — Other Ambulatory Visit: Payer: Self-pay | Admitting: Surgery

## 2018-03-25 ENCOUNTER — Encounter: Payer: Self-pay | Admitting: *Deleted

## 2018-03-25 ENCOUNTER — Inpatient Hospital Stay: Payer: Medicare Other

## 2018-03-25 ENCOUNTER — Other Ambulatory Visit: Payer: Self-pay

## 2018-03-25 ENCOUNTER — Inpatient Hospital Stay: Payer: Medicare Other | Attending: Hematology and Oncology | Admitting: Hematology and Oncology

## 2018-03-25 ENCOUNTER — Ambulatory Visit
Admission: RE | Admit: 2018-03-25 | Discharge: 2018-03-25 | Disposition: A | Discharge status: Home / Self Care | Payer: Medicare Other | Source: Ambulatory Visit | Attending: Radiation Oncology | Admitting: Radiation Oncology

## 2018-03-25 ENCOUNTER — Encounter: Payer: Self-pay | Admitting: Hematology and Oncology

## 2018-03-25 DIAGNOSIS — G809 Cerebral palsy, unspecified: Secondary | ICD-10-CM | POA: Insufficient documentation

## 2018-03-25 DIAGNOSIS — R29898 Other symptoms and signs involving the musculoskeletal system: Secondary | ICD-10-CM | POA: Diagnosis not present

## 2018-03-25 DIAGNOSIS — Z79899 Other long term (current) drug therapy: Secondary | ICD-10-CM

## 2018-03-25 DIAGNOSIS — R293 Abnormal posture: Secondary | ICD-10-CM | POA: Insufficient documentation

## 2018-03-25 DIAGNOSIS — K219 Gastro-esophageal reflux disease without esophagitis: Secondary | ICD-10-CM | POA: Insufficient documentation

## 2018-03-25 DIAGNOSIS — R921 Mammographic calcification found on diagnostic imaging of breast: Secondary | ICD-10-CM | POA: Insufficient documentation

## 2018-03-25 DIAGNOSIS — K5900 Constipation, unspecified: Secondary | ICD-10-CM | POA: Diagnosis not present

## 2018-03-25 DIAGNOSIS — Z170 Estrogen receptor positive status [ER+]: Principal | ICD-10-CM

## 2018-03-25 DIAGNOSIS — C50411 Malignant neoplasm of upper-outer quadrant of right female breast: Secondary | ICD-10-CM | POA: Diagnosis not present

## 2018-03-25 DIAGNOSIS — R262 Difficulty in walking, not elsewhere classified: Secondary | ICD-10-CM | POA: Diagnosis not present

## 2018-03-25 DIAGNOSIS — R1310 Dysphagia, unspecified: Secondary | ICD-10-CM | POA: Diagnosis not present

## 2018-03-25 DIAGNOSIS — I10 Essential (primary) hypertension: Secondary | ICD-10-CM | POA: Diagnosis not present

## 2018-03-25 DIAGNOSIS — K449 Diaphragmatic hernia without obstruction or gangrene: Secondary | ICD-10-CM | POA: Insufficient documentation

## 2018-03-25 DIAGNOSIS — Z9012 Acquired absence of left breast and nipple: Secondary | ICD-10-CM | POA: Insufficient documentation

## 2018-03-25 DIAGNOSIS — E785 Hyperlipidemia, unspecified: Secondary | ICD-10-CM | POA: Diagnosis not present

## 2018-03-25 DIAGNOSIS — C50911 Malignant neoplasm of unspecified site of right female breast: Secondary | ICD-10-CM | POA: Diagnosis not present

## 2018-03-25 DIAGNOSIS — R590 Localized enlarged lymph nodes: Secondary | ICD-10-CM | POA: Diagnosis not present

## 2018-03-25 DIAGNOSIS — M6281 Muscle weakness (generalized): Secondary | ICD-10-CM

## 2018-03-25 DIAGNOSIS — N6099 Unspecified benign mammary dysplasia of unspecified breast: Secondary | ICD-10-CM | POA: Diagnosis not present

## 2018-03-25 DIAGNOSIS — Z171 Estrogen receptor negative status [ER-]: Principal | ICD-10-CM

## 2018-03-25 LAB — CBC WITH DIFFERENTIAL (CANCER CENTER ONLY)
Abs Immature Granulocytes: 0.01 10*3/uL (ref 0.00–0.07)
BASOS ABS: 0.1 10*3/uL (ref 0.0–0.1)
BASOS PCT: 1 %
EOS ABS: 0.2 10*3/uL (ref 0.0–0.5)
Eosinophils Relative: 3 %
HEMATOCRIT: 35.8 % — AB (ref 36.0–46.0)
Hemoglobin: 11.5 g/dL — ABNORMAL LOW (ref 12.0–15.0)
IMMATURE GRANULOCYTES: 0 %
LYMPHS ABS: 2.4 10*3/uL (ref 0.7–4.0)
Lymphocytes Relative: 34 %
MCH: 30.1 pg (ref 26.0–34.0)
MCHC: 32.1 g/dL (ref 30.0–36.0)
MCV: 93.7 fL (ref 80.0–100.0)
Monocytes Absolute: 0.5 10*3/uL (ref 0.1–1.0)
Monocytes Relative: 7 %
NEUTROS PCT: 55 %
NRBC: 0 % (ref 0.0–0.2)
Neutro Abs: 4.1 10*3/uL (ref 1.7–7.7)
Platelet Count: 335 10*3/uL (ref 150–400)
RBC: 3.82 MIL/uL — ABNORMAL LOW (ref 3.87–5.11)
RDW: 13.6 % (ref 11.5–15.5)
WBC: 7.2 10*3/uL (ref 4.0–10.5)

## 2018-03-25 LAB — CMP (CANCER CENTER ONLY)
ALK PHOS: 193 U/L — AB (ref 38–126)
ALT: 21 U/L (ref 0–44)
ANION GAP: 8 (ref 5–15)
AST: 15 U/L (ref 15–41)
Albumin: 3.7 g/dL (ref 3.5–5.0)
BILIRUBIN TOTAL: 0.3 mg/dL (ref 0.3–1.2)
BUN: 13 mg/dL (ref 6–20)
CALCIUM: 9.4 mg/dL (ref 8.9–10.3)
CO2: 26 mmol/L (ref 22–32)
CREATININE: 0.67 mg/dL (ref 0.44–1.00)
Chloride: 110 mmol/L (ref 98–111)
Glucose, Bld: 92 mg/dL (ref 70–99)
Potassium: 3.7 mmol/L (ref 3.5–5.1)
SODIUM: 144 mmol/L (ref 135–145)
TOTAL PROTEIN: 7.5 g/dL (ref 6.5–8.1)

## 2018-03-25 NOTE — Therapy (Signed)
[place], [place], [address] Phone: [phone]   Fax:  [phone]  Physical Therapy Evaluation  Patient Details  Name: [name] MRN: [id number] Date of Birth: Sep 20, 1965 Referring Provider (PT): Dr. Alphonsa Overall   Encounter Date: 03/25/2018  PT End of Session - 03/25/18 1321    Visit Number  1    Number of Visits  2   For Cancer Rehab   Date for PT Re-Evaluation  05/20/18   For Cancer Rehab   PT Start Time  0912    PT Stop Time  0941    PT Time Calculation (min)  29 min    Activity Tolerance  Patient tolerated treatment well    Behavior During Therapy  [facility] for tasks assessed/performed       Past Medical History:  Diagnosis Date  . Cerebral palsy ([place])   . Complication of anesthesia 12/09/2013   Respiratory instability with 10 mg Propofol.Poor gag reflex.  . Constipation - functional   . Dysphagia 2003   2O to GERD/HH, ?difficulty with large food bolus due to neuromuscular weakness  . Epigastric pain MAY 2012 ABD [place] NL GBLIVPAN   2o to GERD V. NON-ULCER DYSPEPSIA  . GERD (gastroesophageal reflux disease) 2007   BPE NL ESO, REFLUX  . Hiatal hernia   . HIATAL HERNIA 11/23/2008   Qualifier: Diagnosis of  By: Zeb Comfort    . HTN (hypertension)   . Hyperlipidemia     Past Surgical History:  Procedure Laterality Date  . COLONOSCOPY  APR 2009 with proprofol   SLF: SIMPLE ADENOMAS  . UPPER GASTROINTESTINAL ENDOSCOPY  MAY 2012 DIL 16 mm   SLF: NO DEFINITE STRICTURE APPRECIATED  . UPPER GASTROINTESTINAL ENDOSCOPY  FEB 2009-HYPOXIA REQUIRING NARCAN, & VERSED, D50V4   SLF: NL ESO, FUNDIC GLAND POLYPS  . UPPER GASTROINTESTINAL ENDOSCOPY  w/ DIL 2003, 2004, 2006    There were no vitals filed for this visit.   Subjective Assessment - 03/25/18 1306    Subjective  Patient's sister reports they are here at the cancer center today to be seen by her medical team for hre newly diagnosed  right breast cancer. She has been attending physical therapy at [facility] for back spasms and may conitnue with that depending on cancer treatment appointments.    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 It measures 2.1 cm and is located in the upper outer quadrant. It is ER positive, PR negative and HER2 positive with a Ki67 of 5%. There is another area of concern in her left breast in the upper outer quadrant that appears suspicious but was unable ot be biopsied due to her spasticity. She has severe cerebral palsy. She uses a wheelchair and is unable to ambulate. Her sister reports she is 100% dependent with transfers and the majority of self care. She reports she can understand what we are saying but it is difficult to [place] her when she speaks.    Patient Stated Goals  Reduce lymphedema risk and learn post op shoulder ROM HEP    Currently in Pain?  Yes    Pain Score  5     Pain Location  Back    Pain Orientation  Lower    Pain Descriptors / Indicators  Spasm    Pain Type  Acute pain  Pain Onset  More than a month ago    Pain Frequency  Intermittent    Aggravating Factors   Unknown    Pain Relieving Factors  Unknown         [facility] PT Assessment - 03/25/18 0001      Assessment   Medical Diagnosis  Right breast cancer    Referring Provider (PT)  Dr. Alphonsa Overall    Onset Date/Surgical Date  02/27/18    Hand Dominance  Right;Left    Prior Therapy  Yes at [facility] for back pain      Precautions   Precautions  Fall    Precaution Comments  Difficulty with communication if sister is not present; dependent with transfers      Restrictions   Weight Bearing Restrictions  No      Balance Screen   Has the patient fallen in the past 6 months  No    Has the patient had a decrease in activity level because of a fear of falling?   Yes   Due to back  pain   Is the patient reluctant to leave their home because of a fear of falling?   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    Leisure  She rides a recombant bike and gets in the pool at the [facility] sometimes      Cognition   Overall Cognitive Status  Within Functional Limits for tasks assessed      Posture/Postural Control   Posture/Postural Control  Postural limitations    Postural Limitations  Rounded Shoulders;Forward head;Increased thoracic kyphosis;Flexed trunk   As noted in wheelchair     ROM / Strength   AROM / PROM / Strength  AROM      AROM   Overall AROM Comments  Active shoulder ROM assessed with pt sitting in wheelchair    AROM Assessment Site  Shoulder;Cervical    Right/Left Shoulder  Right;Left    Right Shoulder Flexion  105 Degrees    Right Shoulder ABduction  80 Degrees    Right Shoulder Internal Rotation  50 Degrees    Right Shoulder External Rotation  70 Degrees    Left Shoulder Flexion  90 Degrees    Left Shoulder ABduction  70 Degrees    Left Shoulder Internal Rotation  65 Degrees    Left Shoulder External Rotation  30 Degrees    Cervical Flexion  WNL    Cervical Extension  WNL    Cervical - Right Side Bend  WNL    Cervical - Left Side Bend  25% limited    Cervical - Right Rotation  WNL    Cervical - Left Rotation  25% limited        LYMPHEDEMA/ONCOLOGY QUESTIONNAIRE - 03/25/18 1319      Type   Cancer Type  Right breast cancer      Lymphedema Assessments   Lymphedema Assessments  Upper  extremities      Right Upper Extremity Lymphedema   10  cm Proximal to Olecranon Process  25 cm    Olecranon Process  22.6 cm    10 cm Proximal to Ulnar Styloid Process  19 cm    Just Proximal to Ulnar Styloid Process  14.1 cm    Across Hand at [employer]  16.4 cm    At [place] of 2nd Digit  5.4 cm      Left Upper Extremity Lymphedema   10 cm Proximal to Olecranon Process  26.5 cm    Olecranon Process  22.8 cm    10 cm Proximal to Ulnar Styloid Process  20.3 cm    Just Proximal to Ulnar Styloid Process  14.7 cm    Across Hand at [employer]  17.1 cm    At [place] of 2nd Digit  5.4 cm             Objective measurements completed on examination: See above findings.        Patient was instructed today in a home exercise program today for post op shoulder range of motion. These included active assist shoulder flexion in sitting, scapular retraction, wall walking with shoulder abduction, and hands behind head external rotation.  She was encouraged to do these twice a day, holding 3 seconds and repeating 5 times when permitted by her physician.          PT Education - 03/25/18 1321    Education Details  Lymphedema risk reduction and post op shoulder ROM HEP    Person(s) Educated  Patient;Other (comment)   Sister   Methods  Explanation;Demonstration;Handout    Comprehension  Returned demonstration;Verbalized understanding       PT Short Term Goals - 03/04/18 1429      PT SHORT TERM GOAL #1   Title  Patient will be independent in initial HEP and perform on a regular basis.     Time  2    Period  Weeks    Status  New    Target Date  03/18/18      PT SHORT TERM GOAL #2   Title  Patient will present with a more erect seated posture with minimal cuing during therapy session.     Baseline  initial - moderate fwd head, rounded shoulders, increased thoracic kyphosis, and decreased lumbar lordosis    Time  2    Period  Weeks    Status  New    Target Date  03/18/18         PT Long Term Goals - 03/25/18 1350      PT LONG TERM GOAL #5   Title  Patient will demonstrate she has returned to baseline post operatively related to shoulder ROM and function.    Time  8    Period  Weeks    Status  New      Breast Clinic Goals - 03/25/18 1350      Patient will be able to verbalize understanding of pertinent lymphedema risk reduction practices relevant to her diagnosis specifically related to skin care.   Time  1    Period  Days    Status  Achieved      Patient will be able to return demonstrate and/or verbalize understanding of the post-op home exercise program related to regaining shoulder range of motion.   Time  1    Status  Achieved      Patient will be able to verbalize understanding of the importance of attending the postoperative After Breast Cancer Class  for further lymphedema risk reduction education and therapeutic exercise.   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 It measures 2.1 cm and is located in the upper outer quadrant. It is ER positive, PR negative and HER2 positive with a Ki67 of 5%. There is another area of concern in her left breast in the upper outer quadrant that appears suspicious but was unable ot be biopsied due to her spasticity. She has severe cerebral palsy. She uses a wheelchair and is unable to ambulate. Her sister reports she is 100% dependent with transfers and the majority of self care. She reports she can understand what we are saying but it is difficult to understand her when she speaks. Her multidisciplinary medical team met prior to her assessments to determine a recommended treatment plan. She is planning to have a bilateral mastectomy and bilateral sentinel node biopsy followed by chemotherapy and anti-estrogen therapy. She will benefit from a post  op PT assessment to determine needs at that time.    History and Personal Factors relevant to plan of care:  Severe cerebral palsy, non-ambulatory; unable to read    Clinical Presentation  Stable    Clinical Decision Making  Low    Rehab Potential  Good   For cancer related deficits   Clinical Impairments Affecting Rehab Potential  none    PT Frequency  --   Eval and 1 f/u visit to determine needs   PT Treatment/Interventions  ADLs/Self Care Home Management;Therapeutic exercise;Patient/family education    PT Next Visit Plan  Will reassess at Cancer Rehab 3-4 weeks post op to determine needs    PT Home Exercise Plan  Post op shoulder ROM HEP    Consulted and Agree with Plan of Care  Patient;Family member/caregiver    Family Member Consulted  sister       Patient will benefit from skilled therapeutic intervention in order to improve the following deficits and impairments:  Decreased knowledge of precautions, Postural dysfunction, Decreased range of motion, Increased muscle spasms, Decreased mobility, Impaired UE functional use, Pain  Visit Diagnosis: Malignant neoplasm of upper-outer quadrant of right breast in female, estrogen receptor positive ([place]) - Plan: PT plan of care cert/re-cert  Abnormal posture - Plan: PT plan of care cert/re-cert  Difficulty in walking, not elsewhere classified - Plan: PT plan of care cert/re-cert  Muscle weakness (generalized) - Plan: PT plan of care cert/re-cert  Other symptoms and signs involving the musculoskeletal system - Plan: PT plan of care cert/re-cert   Patient will follow up at outpatient cancer rehab 3-4 weeks following surgery.  If the patient requires physical therapy at that time, a specific plan will be dictated and sent to the referring physician for approval. The patient was educated today on appropriate basic range of motion exercises to begin post operatively and the importance of attending the After Breast Cancer class following  surgery.  Patient was educated today on lymphedema risk reduction practices as it pertains to recommendations that will benefit the patient immediately following surgery.  She verbalized good understanding.     Problem List Patient Active Problem List   Diagnosis Date Noted  . Malignant neoplasm of upper-outer quadrant of right breast in female, estrogen receptor positive ([place]) 03/23/2018  .  Pre-diabetes 12/24/2016  . Healthcare maintenance 12/26/2014  . Headache 12/26/2014  . Frequency of micturition 09/03/2013  . Need for Tdap vaccination 09/03/2013  . Pain in limb 04/12/2013  . Wart viral 04/12/2013  . HLD (hyperlipidemia) 09/16/2012  . Vitamin D deficiency 09/16/2012  . Hyperglycemia 09/16/2012  . HTN (hypertension) 09/16/2012  . 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 DENTAL PAIN 07/11/2009  . Infantile cerebral palsy ([place]) 11/23/2008  . GERD 11/23/2008  . Constipation 11/23/2008  . Dysphagia 11/23/2008    Annia Friendly, PT 03/25/18 1:53 PM   [place], [place], [address] Phone: [phone]   Fax:  [phone]  Name: [name] MRN: [id number] Date of Birth: Sep 02, 1965

## 2018-03-25 NOTE — Progress Notes (Signed)
Clinical Social Work [place] Psychosocial Distress Screening [place]  Patient completed distress screening protocol and scored a 2 on the Psychosocial Distress Thermometer which indicates mild distress. Clinical Social Worker met with patient and patients family in [facility] to assess for distress and other psychosocial needs. Patient stated she was feeling overwhelmed but felt "better" after meeting with the treatment team and getting more information on her treatment plan. CSW and patient discussed common feeling and emotions when being diagnosed with cancer, and the importance of support during treatment. CSW informed patient of the support team and support services at [facility]. CSW provided contact information and encouraged patient to call with any questions or concerns.  ONCBCN DISTRESS SCREENING 03/25/2018  Screening Type Initial Screening  Distress experienced in past week (1-10) 2  Emotional problem type Adjusting to illness  Physical Problem type Getting around;Constipation/diarrhea  Physician notified of physical symptoms Yes     Johnnye Lana, MSW, LCSW, OSW-C Clinical Social Worker [facility] [phone]

## 2018-03-25 NOTE — Progress Notes (Signed)
Nutrition Assessment  Reason for Assessment:  Pt seen in Breast Clinic  ASSESSMENT:   52 year old female with new diagnosis of breast cancer.  Past medical history of CP (lives with sister), GERD, HLD, Vit D deficiency  Patient reports good, normal appetite with no problems chewing or swallowing foods (all textures).  Medications:  reviewed  Labs: reviewed  Anthropometrics:   Height: 60 inches Weight: 117 lb 9.6 oz BMI: 22  Sister reports stable weight.  Note last weight on 125 lb 12/19/2017   NUTRITION DIAGNOSIS: Food and nutrition related knowledge deficit related to new diagnosis of breast cancer as evidenced by no prior need for nutrition related information.  INTERVENTION:   Discussed and provided packet of information regarding nutritional tips for breast cancer patients.   Encouraged weight maintenance during treatment.  Questions answered.  Teachback method used.  Contact information provided and patient knows to contact me with questions/concerns.    MONITORING, EVALUATION, and GOAL: Pt will consume a healthy plant based diet to maintain lean body mass throughout treatment.   Kane Kusek B. Zenia Resides, [place], [place] Registered Dietitian [phone] (pager)

## 2018-03-25 NOTE — Progress Notes (Signed)
[facility] NOTE  Patient Care Team: Janora Norlander, DO as PCP - General (Family Medicine) Danie Binder, MD (Gastroenterology) Alphonsa Overall, MD as Consulting Physician (General Surgery) Nicholas Lose, MD as Consulting Physician (Hematology and Oncology) Kyung Rudd, MD as Consulting Physician (Radiation Oncology)  CHIEF COMPLAINTS/PURPOSE OF CONSULTATION:  Newly diagnosed right breast cancer  HISTORY OF PRESENTING ILLNESS:  [name] 52 y.o. female is here because of recent diagnosis of right breast cancer.  Patient has cerebral palsy and has mobility issues and muscle contractures in the right arm as well as speech difficulties.  She had a routine screening mammogram that detected bilateral breast abnormalities.  In the right breast she had a 2.1 cm abnormality in the upper outer quadrant at 10 o'clock position.  There was also a suspicious lymph node in the axilla but biopsy could not be done because of her muscle tremors.  In the left breast initially this all calcifications which did not persist on further imaging but incidentally there was a 5 cm cystic/tubular structure which on biopsy came back as fibrocystic change with suspicion for atypical ductal hyperplasia.  She was presented this morning in the tumor board and she is here today to discuss her treatment accompanied by her sister who is her power of attorney.  I reviewed her records extensively and collaborated the history with the patient.  SUMMARY OF ONCOLOGIC HISTORY:   Malignant neoplasm of upper-outer quadrant of right breast in female, estrogen receptor positive ([place])   03/18/2018 Initial Diagnosis    Screening detected bilateral breast abnormalities.  Right breast mass UOQ 2.1 cm at 10 o'clock position: Grade 1 ILC ER 70%, PR 0%, HER-2 +3+ by IHC, Ki-67 5%, suspected lymph node in the axilla could not be biopsied; left breast calcifications by ultrasound not visible but 5 cm cystic/tubular  structure which on biopsy was fibrocystic change with ADHl; T2N0 stage IIa clinical stage      MEDICAL HISTORY:  Past Medical History:  Diagnosis Date  . Cerebral palsy ([place])   . Complication of anesthesia 12/09/2013   Respiratory instability with 10 mg Propofol.Poor gag reflex.  . Constipation - functional   . Dysphagia 2003   2O to GERD/HH, ?difficulty with large food bolus due to neuromuscular weakness  . Epigastric pain MAY 2012 ABD [place] NL GBLIVPAN   2o to GERD V. NON-ULCER DYSPEPSIA  . GERD (gastroesophageal reflux disease) 2007   BPE NL ESO, REFLUX  . Hiatal hernia   . HIATAL HERNIA 11/23/2008   Qualifier: Diagnosis of  By: Zeb Comfort    . HTN (hypertension)   . Hyperlipidemia     SURGICAL HISTORY: Past Surgical History:  Procedure Laterality Date  . COLONOSCOPY  APR 2009 with proprofol   SLF: SIMPLE ADENOMAS  . UPPER GASTROINTESTINAL ENDOSCOPY  MAY 2012 DIL 16 mm   SLF: NO DEFINITE STRICTURE APPRECIATED  . UPPER GASTROINTESTINAL ENDOSCOPY  FEB 2009-HYPOXIA REQUIRING NARCAN, & VERSED, D50V4   SLF: NL ESO, FUNDIC GLAND POLYPS  . UPPER GASTROINTESTINAL ENDOSCOPY  w/ DIL 2003, 2004, 2006    SOCIAL HISTORY: Social History   Socioeconomic History  . Marital status: Single    Spouse name: Not on file  . Number of children: Not on file  . Years of education: Not on file  . Highest education level: Not on file  Occupational History  . Not on file  Social Needs  . Financial resource strain: Not on file  . Food insecurity:  Worry: Not on file    Inability: Not on file  . Transportation needs:    Medical: Not on file    Non-medical: Not on file  Tobacco Use  . Smoking status: Never Smoker  . Smokeless tobacco: Never Used  Substance and Sexual Activity  . Alcohol use: No  . Drug use: No  . Sexual activity: Not Currently    Birth control/protection: None  Lifestyle  . Physical activity:    Days per week: Not on file    Minutes per session: Not on file   . Stress: Not on file  Relationships  . Social connections:    Talks on phone: Not on file    Gets together: Not on file    Attends religious service: Not on file    Active member of club or organization: Not on file    Attends meetings of clubs or organizations: Not on file    Relationship status: Not on file  . Intimate partner violence:    Fear of current or ex partner: Not on file    Emotionally abused: Not on file    Physically abused: Not on file    Forced sexual activity: Not on file  Other Topics Concern  . Not on file  Social History Narrative  . Not on file    FAMILY HISTORY: Family History  Problem Relation Age of Onset  . Colon cancer Mother        LATE 50S    ALLERGIES:  is allergic to hydrocodone.  MEDICATIONS:  Current Outpatient Medications  Medication Sig Dispense Refill  . lisinopril (PRINIVIL,ZESTRIL) 40 MG tablet Take 1 tablet (40 mg total) by mouth daily. 90 tablet 1  . megestrol (MEGACE) 40 MG tablet Take 3 tablets every day (Patient taking differently: Take 60 mg by mouth 2 (two) times daily with a meal. Take 3 tablets every day) 270 tablet 11  . Multiple Vitamin (MULTIVITAMIN) tablet Take 1 tablet by mouth daily.    . pantoprazole (PROTONIX) 40 MG tablet TAKE 1 TABLET BY MOUTH 30 MINUTES PRIOR TO BREAKFAST AND SUPPER 180 tablet 1  . pravastatin (PRAVACHOL) 40 MG tablet Take 1 tablet (40 mg total) by mouth daily. (Patient taking differently: Take 40 mg by mouth every evening. ) 90 tablet 3  . ranitidine (ZANTAC) 300 MG tablet TAKE 1 TABLET BY MOUTH EVERY MORNING AS NEEDED TO PREVENT HEARTBURN (Patient taking differently: Take 300 mg by mouth daily as needed for heartburn. TAKE 1 TABLET BY MOUTH EVERY MORNING AS NEEDED TO PREVENT HEARTBURN) 90 tablet 3   No current facility-administered medications for this visit.     REVIEW OF SYSTEMS:   Constitutional: Denies fevers, chills or abnormal night sweats Eyes: Denies blurriness of vision, double vision  or watery eyes Ears, nose, mouth, throat, and face: Denies mucositis or sore throat Respiratory: Denies cough, dyspnea or wheezes Cardiovascular: Denies palpitation, chest discomfort or lower extremity swelling Gastrointestinal:  Denies nausea, heartburn or change in bowel habits Skin: Denies abnormal skin rashes Lymphatics: Denies new lymphadenopathy or easy bruising Neurological: Cerebral palsy Behavioral/Psych: Mood is stable, no new changes  Breast:  Denies any palpable lumps or discharge All other systems were reviewed with the patient and are negative.  PHYSICAL EXAMINATION: ECOG PERFORMANCE STATUS: 1 - Symptomatic but completely ambulatory  Vitals:   03/25/18 0905  BP: (!) 138/99  Pulse: 83  Resp: 17  Temp: 97.7 F (36.5 C)  SpO2: 97%   Filed Weights   03/25/18 0905  Weight: 117 lb 9.6 oz (53.3 kg)    GENERAL:alert, no distress and comfortable SKIN: skin color, texture, turgor are normal, no rashes or significant lesions EYES: normal, conjunctiva are pink and non-injected, sclera clear OROPHARYNX:no exudate, no erythema and lips, buccal mucosa, and tongue normal  NECK: supple, thyroid normal size, non-tender, without nodularity LYMPH:  no palpable lymphadenopathy in the cervical, axillary or inguinal LUNGS: clear to auscultation and percussion with normal breathing effort HEART: regular rate & rhythm and no murmurs and no lower extremity edema ABDOMEN:abdomen soft, non-tender and normal bowel sounds Musculoskeletal:no cyanosis of digits and no clubbing  PSYCH: Very sweet individual NEURO: Findings related to cerebral palsy with muscular contractures and slurred speech and wheelchair bound BREAST: No palpable nodules in breast. No palpable axillary or supraclavicular lymphadenopathy (exam performed in the presence of a chaperone)   LABORATORY DATA:  I have reviewed the data as listed Lab Results  Component Value Date   WBC 7.2 03/25/2018   HGB 11.5 (L) 03/25/2018    HCT 35.8 (L) 03/25/2018   MCV 93.7 03/25/2018   PLT 335 03/25/2018   Lab Results  Component Value Date   NA 144 03/25/2018   K 3.7 03/25/2018   CL 110 03/25/2018   CO2 26 03/25/2018    RADIOGRAPHIC STUDIES: I have personally reviewed the radiological reports and agreed with the findings in the report.  ASSESSMENT AND PLAN:  Malignant neoplasm of upper-outer quadrant of right breast in female, estrogen receptor positive ([place]) 03/18/2018: Screening detected bilateral breast abnormalities. Right breast mass UOQ 2.1 cm at 10 o'clock position: Grade 1 ILC ER 70%, PR 0%, HER-2 +3+ by IHC, Ki-67 5%, suspected lymph node in the axilla could not be biopsied; question left breast calcifications by ultrasound not visible but 5 cm cystic/tubular structure which on biopsy was fibrocystic change with ADHl; T2N0 stage IIa clinical stage  Pathology and radiology counseling: Discussed with the patient, the details of pathology including the type of breast cancer,the clinical staging, the significance of ER, PR and HER-2/neu receptors and the implications for treatment. After reviewing the pathology in detail, we proceeded to discuss the different treatment options between surgery, radiation, chemotherapy, antiestrogen therapies.  Treatment plan: 1.  Bilateral mastectomies with bilateral sentinel lymph node biopsies 2.  Adjuvant Taxol Herceptin weekly x12 followed by Herceptin maintenance for 1 year 3.  Adjuvant antiestrogen therapy with tamoxifen since the patient still has periods.  Return to clinic after surgery to discuss final pathology report and to finalize a treatment plan.    All questions were answered. The patient knows to call the clinic with any problems, questions or concerns.    Harriette Ohara, MD 03/25/18

## 2018-03-25 NOTE — Progress Notes (Signed)
Radiation Oncology         ([phone]) [phone] ________________________________  Name: [name]        MRN: [id number]  Date of Service: 03/25/2018 DOB: [birth year]  QP:[id number], Koleen Distance, DO  Alphonsa Overall, MD     REFERRING PHYSICIAN: Alphonsa Overall, MD   DIAGNOSIS: The encounter diagnosis was Malignant neoplasm of upper-outer quadrant of right breast in female, estrogen receptor positive ([place]).   HISTORY OF PRESENT ILLNESS: [name] is a 52 y.o. female seen in the multidisciplinary breast clinic for a new diagnosis of right breast cancer. The patient has a history of severe Cerebal Palsy and is wheelchair dependant. She went for screening mammogram and this revealed a mass in the right breast, and calcifications in the left breast. Diagnostic imaging revealed a 2.1 cm mass in the right breast at 10:00, and there was at least one suspicious node in her axilla. She has tremor with her CP and despite valium had some tremor that distorted the ability to sample her abnormal node. Her left breast had a 3 cm span of calcifications and a 5 cm tubular and cystic appearing change at 12:00. She had a biopsy of the right breast and left breast however on 03/18/18 that revealed fibrocystic change in the left breast, with ADH, felt to be discordant. 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 She comes today to discuss treatment recommendations for her cancer.   PREVIOUS RADIATION THERAPY: No   PAST MEDICAL HISTORY:  Past Medical History:  Diagnosis Date  . Cerebral palsy ([place])   . Complication of anesthesia 12/09/2013   Respiratory instability with 10 mg Propofol.Poor gag reflex.  . Constipation - functional   . Dysphagia 2003   2O to GERD/HH, ?difficulty with large food bolus due to neuromuscular weakness  . Epigastric pain MAY 2012 ABD [place] NL GBLIVPAN   2o to GERD V. NON-ULCER DYSPEPSIA  . GERD (gastroesophageal  reflux disease) 2007   BPE NL ESO, REFLUX  . Hiatal hernia   . HIATAL HERNIA 11/23/2008   Qualifier: Diagnosis of  By: Zeb Comfort    . HTN (hypertension)   . Hyperlipidemia        PAST SURGICAL HISTORY: Past Surgical History:  Procedure Laterality Date  . COLONOSCOPY  APR 2009 with proprofol   SLF: SIMPLE ADENOMAS  . UPPER GASTROINTESTINAL ENDOSCOPY  MAY 2012 DIL 16 mm   SLF: NO DEFINITE STRICTURE APPRECIATED  . UPPER GASTROINTESTINAL ENDOSCOPY  FEB 2009-HYPOXIA REQUIRING NARCAN, & VERSED, D50V4   SLF: NL ESO, FUNDIC GLAND POLYPS  . UPPER GASTROINTESTINAL ENDOSCOPY  w/ DIL 2003, 2004, 2006     FAMILY HISTORY:  Family History  Problem Relation Age of Onset  . Colon cancer Mother        LATE 50S     SOCIAL HISTORY:  reports that she has never smoked. She has never used smokeless tobacco. She reports that she does not drink alcohol or use drugs.   ALLERGIES: Hydrocodone   MEDICATIONS:  Current Outpatient Medications  Medication Sig Dispense Refill  . lisinopril (PRINIVIL,ZESTRIL) 40 MG tablet Take 1 tablet (40 mg total) by mouth daily. 90 tablet 1  . megestrol (MEGACE) 40 MG tablet Take 3 tablets every day (Patient taking differently: Take 60 mg by mouth 2 (two) times daily with a meal. Take 3 tablets every day) 270 tablet 11  . Multiple Vitamin (MULTIVITAMIN) tablet Take 1  tablet by mouth daily.    . pantoprazole (PROTONIX) 40 MG tablet TAKE 1 TABLET BY MOUTH 30 MINUTES PRIOR TO BREAKFAST AND SUPPER 180 tablet 1  . pravastatin (PRAVACHOL) 40 MG tablet Take 1 tablet (40 mg total) by mouth daily. (Patient taking differently: Take 40 mg by mouth every evening. ) 90 tablet 3  . ranitidine (ZANTAC) 300 MG tablet TAKE 1 TABLET BY MOUTH EVERY MORNING AS NEEDED TO PREVENT HEARTBURN (Patient taking differently: Take 300 mg by mouth daily as needed for heartburn. TAKE 1 TABLET BY MOUTH EVERY MORNING AS NEEDED TO PREVENT HEARTBURN) 90 tablet 3   No current facility-administered  medications for this encounter.      REVIEW OF SYSTEMS: On review of systems, the patient's family state she's doing well overall. There are no verbalized concerns of any symptoms at this time. She is unable to walk due to her CP. She has been on Megace for controlling her menstrual bleeding. No complaints are verbalized.     PHYSICAL EXAM:  Wt Readings from Last 3 Encounters:  03/25/18 117 lb 9.6 oz (53.3 kg)  12/19/17 125 lb (56.7 kg)  06/26/17 126 lb (57.2 kg)   Temp Readings from Last 3 Encounters:  03/25/18 97.7 F (36.5 C) (Oral)  02/09/18 97.8 F (36.6 C) (Oral)  12/25/17 98 F (36.7 C) (Oral)   BP Readings from Last 3 Encounters:  03/25/18 (!) 138/99  02/09/18 (!) 162/100  12/25/17 (!) 152/115   Pulse Readings from Last 3 Encounters:  03/25/18 83  02/09/18 94  12/25/17 (!) 116     In general this is a well appearing disabled [place] female in no acute distress. She is alert and oriented x4 and appropriate throughout the examination, her communication is limited in her speaking but is understood by her family.  HEENT reveals that the patient is normocephalic, atraumatic. EOMs are intact. Skin is intact without any evidence of gross lesions.  Cardiopulmonary assessment is negative for acute distress and she exhibits normal effort.   ECOG = 0  0 - Asymptomatic (Fully active, able to carry on all predisease activities without restriction)  1 - Symptomatic but completely ambulatory (Restricted in physically strenuous activity but ambulatory and able to carry out work of a light or sedentary nature. For example, light housework, office work)  2 - Symptomatic, <50% in bed during the day (Ambulatory and capable of all self care but unable to carry out any work activities. Up and about more than 50% of waking hours)  3 - Symptomatic, >50% in bed, but not bedbound (Capable of only limited self-care, confined to bed or chair 50% or more of waking hours)  4 -  Bedbound (Completely disabled. Cannot carry on any self-care. Totally confined to bed or chair)  5 - Death   Eustace Pen MM, Creech RH, Tormey DC, et al. [phone]). "Toxicity and response criteria of the [facility]". [place] Oncol. 5 (6): 649-55    LABORATORY DATA:  Lab Results  Component Value Date   WBC 7.2 03/25/2018   HGB 11.5 (L) 03/25/2018   HCT 35.8 (L) 03/25/2018   MCV 93.7 03/25/2018   PLT 335 03/25/2018   Lab Results  Component Value Date   NA 146 (H) 12/19/2017   K 3.2 (L) 12/19/2017   CL 109 12/19/2017   CO2 28 12/19/2017   Lab Results  Component Value Date   ALT 20 06/09/2017   AST 19 06/09/2017   ALKPHOS 85 06/09/2017  BILITOT <0.2 06/09/2017      RADIOGRAPHY: No results found.     IMPRESSION/PLAN: 1. 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 Dr. Lisbeth Renshaw discusses the pathology findings and reviews the nature of invasive right breast disease. The consensus from the breast conference includes considering bilateral mastectomies and bilateral sentinel node biopsies. She would be benefited by adjuvant chemotherapy and targeted Herceptin. Depending on the final results of pathology, she likely would not need adjuvant radiotherapy. We discussed that given her cerebral palsy with spasticity, this could limit the ability for [place] to give her treatment given movement, but that we will follow up with the results of her surgery before making definitive plans. We discussed scenarios for when radiotherapy is given following mastectomy, and discussed the risks, benefits, short, and long term effects of radiotherapy. Dr. Lisbeth Renshaw discusses the delivery and logistics of radiotherapy and anticipates a course of 6 1/2 weeks of radiotherapy to the chest wall and regional nodes if we decided to proceed. We will follow up with her final pathology in conference and would be happy to see her back as needed.  In a  visit lasting 45 minutes, greater than 50% of the time was spent face to face discussing her case, and coordinating the patient's care.  The above documentation reflects my direct findings during this shared patient visit. Please see the separate note by Dr. Lisbeth Renshaw on this date for the remainder of the patient's plan of care.    Carola Rhine, PAC

## 2018-03-25 NOTE — Patient Instructions (Signed)

## 2018-03-25 NOTE — Telephone Encounter (Signed)
No 11/13 los or referrals.

## 2018-03-25 NOTE — Assessment & Plan Note (Signed)
03/18/2018: Screening detected bilateral breast abnormalities. Right breast mass UOQ 2.1 cm at 10 o'clock position: Grade 1 ILC ER 70%, PR 0%, HER-2 +3+ by IHC, Ki-67 5%, suspected lymph node in the axilla could not be biopsied; question left breast calcifications by ultrasound not visible but 5 cm cystic/tubular structure which on biopsy was fibrocystic change with ADHl; T2N0 stage IIa clinical stage  Pathology and radiology counseling: Discussed with the patient, the details of pathology including the type of breast cancer,the clinical staging, the significance of ER, PR and HER-2/neu receptors and the implications for treatment. After reviewing the pathology in detail, we proceeded to discuss the different treatment options between surgery, radiation, chemotherapy, antiestrogen therapies.  Treatment plan: 1.  Bilateral mastectomies with bilateral sentinel lymph node biopsies 2.  Adjuvant Taxol Herceptin weekly x12 followed by Herceptin maintenance for 1 year 3.  Adjuvant antiestrogen therapy with tamoxifen since the patient still has periods.  Return to clinic after surgery to discuss final pathology report and to finalize a treatment plan.

## 2018-03-27 ENCOUNTER — Ambulatory Visit (HOSPITAL_COMMUNITY): Payer: Medicare Other

## 2018-03-30 ENCOUNTER — Ambulatory Visit (HOSPITAL_COMMUNITY): Payer: Medicare Other | Admitting: Physical Therapy

## 2018-03-31 ENCOUNTER — Telehealth: Payer: Self-pay | Admitting: *Deleted

## 2018-03-31 NOTE — Telephone Encounter (Signed)
Spoke to pt sister [name] regarding [facility] from 03/25/18. Denies questions or concerns regarding dx or treatment care plan. Scheduled and confirmed appt with Dr. Lindi Adie on 12/30 at 8:45. Encourage pt and sister to call with needs. Received verbal understanding.

## 2018-04-01 ENCOUNTER — Ambulatory Visit (HOSPITAL_COMMUNITY): Payer: Medicare Other

## 2018-04-01 ENCOUNTER — Ambulatory Visit (HOSPITAL_COMMUNITY)
Admission: RE | Admit: 2018-04-01 | Discharge: 2018-04-01 | Disposition: A | Discharge status: Home / Self Care | Payer: Medicare Other | Source: Ambulatory Visit | Attending: Hematology and Oncology | Admitting: Hematology and Oncology

## 2018-04-01 DIAGNOSIS — I10 Essential (primary) hypertension: Secondary | ICD-10-CM | POA: Diagnosis not present

## 2018-04-01 DIAGNOSIS — Z170 Estrogen receptor positive status [ER+]: Secondary | ICD-10-CM | POA: Diagnosis not present

## 2018-04-01 DIAGNOSIS — C50411 Malignant neoplasm of upper-outer quadrant of right female breast: Secondary | ICD-10-CM | POA: Insufficient documentation

## 2018-04-01 DIAGNOSIS — G809 Cerebral palsy, unspecified: Secondary | ICD-10-CM | POA: Diagnosis not present

## 2018-04-01 DIAGNOSIS — E785 Hyperlipidemia, unspecified: Secondary | ICD-10-CM | POA: Diagnosis not present

## 2018-04-01 DIAGNOSIS — K449 Diaphragmatic hernia without obstruction or gangrene: Secondary | ICD-10-CM | POA: Diagnosis not present

## 2018-04-01 DIAGNOSIS — Z01810 Encounter for preprocedural cardiovascular examination: Secondary | ICD-10-CM | POA: Diagnosis not present

## 2018-04-01 NOTE — Progress Notes (Signed)
  Echocardiogram 2D Echocardiogram has been performed.  [name] 04/01/2018, 12:14 PM

## 2018-04-03 ENCOUNTER — Ambulatory Visit (HOSPITAL_COMMUNITY): Payer: Medicare Other

## 2018-04-20 NOTE — Pre-Procedure Instructions (Signed)
[name]  04/20/2018      [id number] - [place], [place] - [address] [place] [address] Phone: [phone] Fax: [phone]    Your procedure is scheduled on May 01, 2018.  Report to [facility] Admitting at 845 AM.  Call this number if you have problems the morning of surgery:  [phone]   Remember:  Do not eat or drink after midnight.    Take these medicines the morning of surgery with A SIP OF WATER  Pantoprazole (protonix)  7 days prior to surgery STOP taking any Aspirin (unless otherwise instructed by your surgeon), Aleve, Naproxen, Ibuprofen, Motrin, Advil, Goody's, BC's, all herbal medications, fish oil, and all vitamins     Do not wear jewelry, make-up or nail polish.  Do not wear lotions, powders, or perfumes, or deodorant.  Do not shave 48 hours prior to surgery.    Do not bring valuables to the hospital.  [facility] is not responsible for any belongings or valuables.  Contacts, dentures or bridgework may not be worn into surgery.  Leave your suitcase in the car.  After surgery it may be brought to your room.  For patients admitted to the hospital, discharge time will be determined by your treatment team.  Patients discharged the day of surgery will not be allowed to drive home.   [facility]- Preparing For Surgery  Before surgery, you can play an important role. Because skin is not sterile, your skin needs to be as free of germs as possible. You can reduce the number of germs on your skin by washing with CHG (chlorahexidine gluconate) Soap before surgery.  CHG is an antiseptic cleaner which kills germs and bonds with the skin to continue killing germs even after washing.    Oral Hygiene is also important to reduce your risk of infection.  Remember - BRUSH YOUR TEETH THE MORNING OF SURGERY WITH YOUR REGULAR TOOTHPASTE  Please do not use if you have an allergy to CHG or antibacterial  soaps. If your skin becomes reddened/irritated stop using the CHG.  Do not shave (including legs and underarms) for at least 48 hours prior to first CHG shower. It is OK to shave your face.  Please follow these instructions carefully.   1. Shower the NIGHT BEFORE SURGERY and the MORNING OF SURGERY with CHG.   2. If you chose to wash your hair, wash your hair first as usual with your normal shampoo.  3. After you shampoo, rinse your hair and body thoroughly to remove the shampoo.  4. Use CHG as you would any other liquid soap. You can apply CHG directly to the skin and wash gently with a scrungie or a clean washcloth.   5. Apply the CHG Soap to your body ONLY FROM THE NECK DOWN.  Do not use on open wounds or open sores. Avoid contact with your eyes, ears, mouth and genitals (private parts). Wash Face and genitals (private parts)  with your normal soap.  6. Wash thoroughly, paying special attention to the area where your surgery will be performed.  7. Thoroughly rinse your body with warm water from the neck down.  8. DO NOT shower/wash with your normal soap after using and rinsing off the CHG Soap.  9. Pat yourself dry with a CLEAN TOWEL.  10. Wear CLEAN PAJAMAS to bed the night before surgery, wear comfortable clothes the morning of surgery  11. Place CLEAN  SHEETS on your bed the night of your first shower and DO NOT SLEEP WITH PETS.  Day of Surgery:  Do not apply any deodorants/lotions.  Please wear clean clothes to the hospital/surgery center.   Remember to brush your teeth WITH YOUR REGULAR TOOTHPASTE.  Please read over the following fact sheets that you were given.

## 2018-04-21 ENCOUNTER — Encounter (HOSPITAL_COMMUNITY): Payer: Self-pay

## 2018-04-21 ENCOUNTER — Encounter (HOSPITAL_COMMUNITY)
Admission: RE | Admit: 2018-04-21 | Discharge: 2018-04-21 | Disposition: A | Discharge status: Home / Self Care | Payer: Medicare Other | Source: Ambulatory Visit | Attending: Surgery | Admitting: Surgery

## 2018-04-21 DIAGNOSIS — I10 Essential (primary) hypertension: Secondary | ICD-10-CM | POA: Insufficient documentation

## 2018-04-21 DIAGNOSIS — Z01818 Encounter for other preprocedural examination: Secondary | ICD-10-CM | POA: Insufficient documentation

## 2018-04-21 HISTORY — DX: Unspecified osteoarthritis, unspecified site: M19.90

## 2018-04-21 LAB — BASIC METABOLIC PANEL
Anion gap: 11 (ref 5–15)
BUN: 11 mg/dL (ref 6–20)
CO2: 22 mmol/L (ref 22–32)
Calcium: 9.4 mg/dL (ref 8.9–10.3)
Chloride: 107 mmol/L (ref 98–111)
Creatinine, Ser: 0.54 mg/dL (ref 0.44–1.00)
GFR calc Af Amer: >60 mL/min (ref >60)
GLUCOSE: 104 mg/dL — AB (ref 70–99)
Potassium: 3.4 mmol/L — ABNORMAL LOW (ref 3.5–5.1)
Sodium: 140 mmol/L (ref 135–145)

## 2018-04-21 LAB — CBC
HEMATOCRIT: 37.8 % (ref 36.0–46.0)
HEMOGLOBIN: 12.2 g/dL (ref 12.0–15.0)
MCH: 30.1 pg (ref 26.0–34.0)
MCHC: 32.3 g/dL (ref 30.0–36.0)
MCV: 93.3 fL (ref 80.0–100.0)
Platelets: 351 10*3/uL (ref 150–400)
RBC: 4.05 MIL/uL (ref 3.87–5.11)
RDW: 12.9 % (ref 11.5–15.5)
WBC: 7.8 10*3/uL (ref 4.0–10.5)
nRBC: 0 % (ref 0.0–0.2)

## 2018-04-21 MED ORDER — CHLORHEXIDINE GLUCONATE CLOTH 2 % EX PADS: 6.0000 | MEDICATED_PAD | Freq: Once | CUTANEOUS | Status: DC

## 2018-04-21 NOTE — Progress Notes (Signed)
Anesthesia PAT Evaluation:   Case:  [id number] Date/Time:  05/01/18 1030   Procedures:      BILATERAL MASTECTOMIES WITH BILATERAL SENTINEL LYMPH NODE BIOPSIES (Bilateral Breast)     INSERTION PORT-A-CATH WITH [place] (N/A )   Anesthesia type:  General   Pre-op diagnosis:  RIGHT BREAST CANCER, LEFT BREAST ATYPICAL DUCTAL HYPERPLASIA   Location:  [facility] ROOM 02 / [facility] [facility]   Surgeon:  Alphonsa Overall, MD      DISCUSSION: Patient is a 52 year old female scheduled for the above procedure.   History includes cerebral palsy, never smoker, breast cancer, GERD, dysphagia, hiatal hernia, HTN, HLD.  Patient seen at PAT due to documented complication of "respiratory instability with 10 mg Propofol. Poor gag reflex" from 12/09/13 encounter at [facility].  She was scheduled for a EGD/colonoscopy but after she was given propofol she "developed respiratory compromise and hypoxia. Anesthesia recommends pt be considered for TCS/possible EGD/Dil in Sep 2015 with general anesthesia." Patient went on to have general anesthesia for EGD/colonoscopy at [facility] on 01/24/14 without anesthesia complication (see below for airway exam).  Airway Procedural Note 01/24/14 ([place]): Airway Type: Normal POSITIONING: Note:  Ability to Mask Ventilate Patient: Easy LARYNGOSCOPY: Blade(s) Used: Sabra Heck 2  Attempts: One Grade of View: One Intubation Trauma: None ETT Size/Location: 6.5, Cuffed and Oral Secured with: Pink Tape Secured at (cm): 22 End Tidal C02 is positive and breath sounds equal bilaterally  ENDOBRONCHIAL TUBE: Electronically signed by: Reatha Harps, CRNA 01/24/14 1349   - Patient seen at PAT due to anesthesia history as above. No anesthesia issues otherwise. She does have CP and is in a wheelchair. She has a kyphotic posture. She has speech issues, but is able to answer with yes/no type questions. She denied chest pain or SOB. Denied issues with dysphagia and feels she is able to clear  her secretions well. Her lungs were clear. Heart RRR, no murmur noted. Case is posted for general anesthesia. If no acute changes then I anticipate that she can proceed as planned.   VS: BP 120/88   Pulse 91   Temp 37.6 C   Resp 20   Ht 5\' 2"  (1.575 m)   Wt 52.6 kg   SpO2 100%   BMI 21.22 kg/m    PROVIDERS: Janora Norlander, DO is PCP Nicholas Lose, MD is HEM-ONC  LABS: Labs reviewed: Acceptable for surgery. (all labs ordered are listed, but only abnormal results are displayed)  Labs Reviewed  BASIC METABOLIC PANEL - Abnormal; Notable for the following components:      Result Value   Potassium 3.4 (*)    Glucose, Bld 104 (*)    All other components within normal limits  CBC    EKG: 04/21/18: NSR   CV: Echo 04/01/18: Study Conclusions - Left ventricle: The cavity size was normal. Wall thickness was   increased in a pattern of mild LVH. Systolic function was normal.   The estimated ejection fraction was in the range of 60% to 65%.   Wall motion was normal; there were no regional wall motion   abnormalities. Doppler parameters are consistent with abnormal   left ventricular relaxation (grade 1 diastolic dysfunction). GLS:   -17.4%   Past Medical History:  Diagnosis Date  . Arthritis   . Cerebral palsy ([place])   . Complication of anesthesia 12/09/2013   Respiratory instability with 10 mg Propofol.Poor gag reflex.  . Constipation - functional   . Dysphagia 2003  2O to GERD/HH, ?difficulty with large food bolus due to neuromuscular weakness  . Epigastric pain MAY 2012 ABD [place] NL GBLIVPAN   2o to GERD V. NON-ULCER DYSPEPSIA  . GERD (gastroesophageal reflux disease) 2007   BPE NL ESO, REFLUX  . Hiatal hernia   . HIATAL HERNIA 11/23/2008   Qualifier: Diagnosis of  By: Zeb Comfort    . HTN (hypertension)   . Hyperlipidemia     Past Surgical History:  Procedure Laterality Date  . COLONOSCOPY  APR 2009 with proprofol   SLF: SIMPLE ADENOMAS  . UPPER  GASTROINTESTINAL ENDOSCOPY  MAY 2012 DIL 16 mm   SLF: NO DEFINITE STRICTURE APPRECIATED  . UPPER GASTROINTESTINAL ENDOSCOPY  FEB 2009-HYPOXIA REQUIRING NARCAN, & VERSED, D50V4   SLF: NL ESO, FUNDIC GLAND POLYPS  . UPPER GASTROINTESTINAL ENDOSCOPY  w/ DIL 2003, 2004, 2006    MEDICATIONS: . lisinopril (PRINIVIL,ZESTRIL) 40 MG tablet  . megestrol (MEGACE) 40 MG tablet  . Multiple Vitamin (MULTIVITAMIN) tablet  . pantoprazole (PROTONIX) 40 MG tablet  . polyethylene glycol (MIRALAX / GLYCOLAX) packet  . pravastatin (PRAVACHOL) 40 MG tablet   . Chlorhexidine Gluconate Cloth 2 % PADS 6 each   And  . Chlorhexidine Gluconate Cloth 2 % PADS 6 each    George Hugh [facility] Short Stay Center/Anesthesiology Phone [phone] 04/21/2018 4:45 PM

## 2018-04-22 NOTE — Anesthesia Preprocedure Evaluation (Addendum)
Anesthesia Evaluation  Patient identified by MRN, date of birth, ID band Patient awake    Reviewed: Allergy & Precautions, NPO status , Patient's Chart, lab work & pertinent test results  Airway Mallampati: II  TM Distance: >3 FB Neck ROM: Full    Dental no notable dental hx.    Pulmonary neg pulmonary ROS,    Pulmonary exam normal breath sounds clear to auscultation       Cardiovascular hypertension, negative cardio ROS Normal cardiovascular exam Rhythm:Regular Rate:Normal     Neuro/Psych Cerebral palsy negative psych ROS   GI/Hepatic Neg liver ROS, hiatal hernia, GERD  ,dysphagia   Endo/Other  negative endocrine ROS  Renal/GU negative Renal ROS  negative genitourinary   Musculoskeletal negative musculoskeletal ROS (+)   Abdominal   Peds negative pediatric ROS (+)  Hematology negative hematology ROS (+)   Anesthesia Other Findings   Reproductive/Obstetrics negative OB ROS                            Anesthesia Physical Anesthesia Plan  ASA: III  Anesthesia Plan: General   Post-op Pain Management:  Regional for Post-op pain   Induction: Intravenous  PONV Risk Score and Plan: 3 and Ondansetron and Treatment may vary due to age or medical condition  Airway Management Planned: Oral ETT  Additional Equipment:   Intra-op Plan:   Post-operative Plan: Extubation in OR  Informed Consent: I have reviewed the patients History and Physical, chart, labs and discussed the procedure including the risks, benefits and alternatives for the proposed anesthesia with the patient or authorized representative who has indicated his/her understanding and acceptance.   Dental advisory given  Plan Discussed with: CRNA  Anesthesia Plan Comments: ( )      Anesthesia Quick Evaluation

## 2018-04-30 NOTE — H&P (Signed)
[name]  Location: [facility] Patient #: [id number] DOB: 1966-01-23 Undefined / Language: Cleophus Molt / Race: Black or African American Female  History of Present Illness   The patient is a 52 year old female who presents with a complaint of breast cancer.  She goes by "[name]".   The PCP is Dr. Aldean Jewett [facility])  The patient was referred by Dr. Emmit Pomfret  She sees Dr. Tania Ade ([place]) and Dr. Artis Flock (GI)  The pateint is at the Breast [facility] - Oncology is Drs. Lindi Adie and [place] She is accompanied by her sister, Damaya Channing, and a friend, Jeanine Luz.  Her last set of mammograms was 2 years ago. The mammograms caused pain, so that's why she skipped last year. She felt a mass in her right breast upper outer quadrant, and this partially prompted this year's mammography. She seems to understand well what she is told. Her sister acts as an Product/process development scientist".  Mammograms: she had mammograms on [facility] on 03/18/2018 and 03/12/2018 - which showed a 2.1 cm mass at the 10 o'clock in the right breast and a 3.0 cm area of Ca++ and a 5 cm long tubular structure i the left breast Biopsy: Left and right beast biopsy - 03/18/2018 - [id number] - LEFT breast at 12 o'clock shows fibrocystic changes with focus of ADH, RIGHT breast biopsy at 10 o'clock shows ILC, grade 1/2, ER - 70%, PR - 0%, Ki67 - 5%, and Her2Neu - POSITIVE Family history of breast or ovarian cancer: no On hormone therapy: on megace  I discussed the options for breast cancer treatment with the patient. The patient is at the [facility], which includes medical oncology and radiation oncology. I discussed the surgical options of lumpectomy vs. mastectomy. If mastectomy, there is the possibility of reconstruction. I discussed the options of lymph node biopsy. The treatment plan depends on the pathologic staging of the tumor and the patient's personal  wishes. The risks of surgery include, but are not limited to, bleeding, infection, the need for further surgery, and nerve injury. The patient has been given literature on the treatment of breast cancer. Because of the complex nature of her medical condition and the difficulty imaging her with her movement - I think that she is best served with bilateral mastectomies. We talked about reconstruction - but agree to delay this consultation until after all her treatment.  I discussed the indications and potential complications of the power port placement. The primary complications of the power port, include, but are not limited to, bleeding, infection, nerve injury, thrombosis, and pneumothorax.  Plan: 1) With her medical condition and trouble imaging and following her breast, consider bilateral mastectomies with bilateral SLNBx, 2) Port placement, 3) Adjuvant chemotherapy  Past Medical History: 1. Cerebral palsey 2. HTN 3. GERD - sees Dr. Oneida Alar for this  Social History: She is accompanied by her sister, Amberlyn Martinezgarcia, and a friend, Jeanine Luz. She lives with her sister, York Grice, and Evette's grandson.   Past Surgical History Tawni Pummel, RN; 03/25/2018 7:30 AM) No pertinent past surgical history   Diagnostic Studies History Tawni Pummel, RN; 03/25/2018 7:30 AM) Colonoscopy  1-5 years ago Mammogram  within last year Pap Smear  1-5 years ago  Medication History Tawni Pummel, RN; 03/25/2018 7:30 AM) Medications Reconciled  Social History Tawni Pummel, RN; 03/25/2018 7:30 AM) Caffeine use  Tea. No alcohol use  No drug use  Tobacco use  Never smoker.  Family History Tawni Pummel, RN; 03/25/2018  7:30 AM) Arthritis  Mother. Cerebrovascular Accident  Mother. Colon Cancer  Mother. Colon Polyps  Sister. Hypertension  Brother, Mother. Respiratory Condition  Mother, Sister.  Pregnancy / Birth History Tawni Pummel, RN;  03/25/2018 7:30 AM) Durenda Age  0 Para  0  Other Problems Tawni Pummel, RN; 03/25/2018 7:30 AM) Arthritis  Back Pain  Gastroesophageal Reflux Disease  General anesthesia - complications  High blood pressure  Hypercholesterolemia  Other disease, cancer, significant illness  Transfusion history     Review of Systems Sunday Spillers [place] RN; 03/25/2018 7:30 AM) General Not Present- Appetite Loss, Chills, Fatigue, Fever, Night Sweats, Weight Gain and Weight Loss. Skin Not Present- Change in Wart/Mole, Dryness, Hives, Jaundice, New Lesions, Non-Healing Wounds, Rash and Ulcer. HEENT Present- Wears glasses/contact lenses. Not Present- Earache, Hearing Loss, Hoarseness, Nose Bleed, Oral Ulcers, Ringing in the Ears, Seasonal Allergies, Sinus Pain, Sore Throat, Visual Disturbances and Yellow Eyes. Respiratory Present- Snoring. Not Present- Bloody sputum, Chronic Cough, Difficulty Breathing and Wheezing. Breast Present- Breast Mass and Breast Pain. Not Present- Nipple Discharge and Skin Changes. Cardiovascular Not Present- Chest Pain, Difficulty Breathing Lying Down, Leg Cramps, Palpitations, Rapid Heart Rate, Shortness of Breath and Swelling of Extremities. Gastrointestinal Present- Constipation and Indigestion. Not Present- Abdominal Pain, Bloating, Bloody Stool, Change in Bowel Habits, Chronic diarrhea, Difficulty Swallowing, Excessive gas, Gets full quickly at meals, Hemorrhoids, Nausea, Rectal Pain and Vomiting. Female Genitourinary Not Present- Frequency, Nocturia, Painful Urination, Pelvic Pain and Urgency. Musculoskeletal Present- Muscle Weakness. Not Present- Back Pain, Joint Pain, Joint Stiffness, Muscle Pain and Swelling of Extremities. Neurological Present- Trouble walking and Weakness. Not Present- Decreased Memory, Fainting, Headaches, Numbness, Seizures, Tingling and Tremor. Psychiatric Not Present- Anxiety, Bipolar, Change in Sleep Pattern, Depression, Fearful and Frequent  crying. Endocrine Not Present- Cold Intolerance, Excessive Hunger, Hair Changes, Heat Intolerance, Hot flashes and New Diabetes. Hematology Not Present- Blood Thinners, Easy Bruising, Excessive bleeding, Gland problems, HIV and Persistent Infections.   Physical Exam  General: Wheel chair bound AA who isalert. Her CP makes understanding her difficult, but she does understand well what is going on and the discussions. Skin: Inspection and palpation of the skin unremarkable.  Eyes: Conjunctivae white, pupils equal. Face, ears, nose, mouth, and throat: Face - normal. Normal ears and nose. Lips and teeth normal.  Neck: Supple. No mass. Trachea midline. No thyroid mass.  Lymph Nodes: No supraclavicular or cervical adenopathy. No axillary adenopathy.  Lungs: Normal respiratory effort. Clear to auscultation and symmetric breath sounds. Cardiovascular: Regular rate and rythm. Normal auscultation of the heart. No murmur or rub.  Breasts: Right -3 - 4 cm mass at 10 o'clock, towards the axilla Left - pigmented lesion (probably seborrheic keratosis), no clear mass  Abdomen: Soft. No mass. Liver and spleen not palpable. No tenderness. No hernia. Normal bowel sounds. No abdominal scars. Rectal: Not done.  Musculoskeletal/extremities: Spastic. requires full assistance to get on the exam table.  Psychiatric: As best I can tell, she has normal mood and affect.  Assessment & Plan  1.  MALIGNANT NEOPLASM OF RIGHT BREAST, STAGE 1, ESTROGEN RECEPTOR POSITIVE (C50.911)  Story: Left and right beast biopsy - 03/18/2018 - [id number] - LEFT breast at 12 o'clock shows fibrocystic changes with ductal hyperplasia (possible ADH), RIGHT breast biopsy at 10 o'clock shows ILC, grade 1/2, ER - 70%, PR - 0%, Ki67 - 5%, and Her2Neu - POSITIVE  Oncology - Lindi Adie and [place]  Plan:  1. With her medical condition and trouble imaging and following her breast, consider bilateral mastectomies  with  bilateral SLNBx (reconstruction on delayed basis, depending on how she does)  2. Power port placement  3) Adjuvant chemotherapy  2.  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  CEREBRAL PALSY (G80.9) 4. HTN 5. GERD - sees Dr. Oneida Alar for this   Alphonsa Overall, MD, [facility] Pager: [phone] Office phone:  [phone]

## 2018-05-01 ENCOUNTER — Encounter (HOSPITAL_COMMUNITY): Payer: Self-pay

## 2018-05-01 ENCOUNTER — Encounter (HOSPITAL_COMMUNITY)
Admission: RE | Admit: 2018-05-01 | Discharge: 2018-05-01 | Disposition: A | Discharge status: Home / Self Care | Payer: Medicare Other | Source: Ambulatory Visit | Attending: Surgery | Admitting: Surgery

## 2018-05-01 ENCOUNTER — Observation Stay (HOSPITAL_COMMUNITY): Payer: Medicare Other

## 2018-05-01 ENCOUNTER — Inpatient Hospital Stay (HOSPITAL_COMMUNITY)
Admission: RE | Admit: 2018-05-01 | Discharge: 2018-05-02 | DRG: 581 | Disposition: A | Discharge status: Home / Self Care | Payer: Medicare Other | Attending: Surgery | Admitting: Surgery

## 2018-05-01 ENCOUNTER — Encounter (HOSPITAL_COMMUNITY)
Admission: RE | Disposition: A | Discharge status: Home / Self Care | Payer: Self-pay | Source: Home / Self Care | Attending: Surgery

## 2018-05-01 ENCOUNTER — Inpatient Hospital Stay (HOSPITAL_COMMUNITY): Payer: Medicare Other | Admitting: Certified Registered Nurse Anesthetist

## 2018-05-01 ENCOUNTER — Inpatient Hospital Stay (HOSPITAL_COMMUNITY): Payer: Medicare Other

## 2018-05-01 ENCOUNTER — Inpatient Hospital Stay (HOSPITAL_COMMUNITY): Payer: Medicare Other | Admitting: Vascular Surgery

## 2018-05-01 ENCOUNTER — Other Ambulatory Visit: Payer: Self-pay

## 2018-05-01 DIAGNOSIS — C50911 Malignant neoplasm of unspecified site of right female breast: Secondary | ICD-10-CM | POA: Diagnosis not present

## 2018-05-01 DIAGNOSIS — G8918 Other acute postprocedural pain: Secondary | ICD-10-CM | POA: Diagnosis not present

## 2018-05-01 DIAGNOSIS — C50912 Malignant neoplasm of unspecified site of left female breast: Secondary | ICD-10-CM | POA: Diagnosis present

## 2018-05-01 DIAGNOSIS — Z170 Estrogen receptor positive status [ER+]: Secondary | ICD-10-CM | POA: Diagnosis not present

## 2018-05-01 DIAGNOSIS — E7800 Pure hypercholesterolemia, unspecified: Secondary | ICD-10-CM | POA: Diagnosis present

## 2018-05-01 DIAGNOSIS — Z823 Family history of stroke: Secondary | ICD-10-CM | POA: Diagnosis not present

## 2018-05-01 DIAGNOSIS — Z993 Dependence on wheelchair: Secondary | ICD-10-CM | POA: Diagnosis not present

## 2018-05-01 DIAGNOSIS — J439 Emphysema, unspecified: Secondary | ICD-10-CM | POA: Diagnosis not present

## 2018-05-01 DIAGNOSIS — N6099 Unspecified benign mammary dysplasia of unspecified breast: Secondary | ICD-10-CM | POA: Diagnosis not present

## 2018-05-01 DIAGNOSIS — C439 Malignant melanoma of skin, unspecified: Secondary | ICD-10-CM | POA: Diagnosis present

## 2018-05-01 DIAGNOSIS — I10 Essential (primary) hypertension: Secondary | ICD-10-CM | POA: Diagnosis present

## 2018-05-01 DIAGNOSIS — Z452 Encounter for adjustment and management of vascular access device: Secondary | ICD-10-CM | POA: Diagnosis not present

## 2018-05-01 DIAGNOSIS — Z79891 Long term (current) use of opiate analgesic: Secondary | ICD-10-CM

## 2018-05-01 DIAGNOSIS — M1990 Unspecified osteoarthritis, unspecified site: Secondary | ICD-10-CM | POA: Diagnosis present

## 2018-05-01 DIAGNOSIS — D0512 Intraductal carcinoma in situ of left breast: Secondary | ICD-10-CM | POA: Diagnosis not present

## 2018-05-01 DIAGNOSIS — L821 Other seborrheic keratosis: Secondary | ICD-10-CM | POA: Diagnosis not present

## 2018-05-01 DIAGNOSIS — Z800 Family history of malignant neoplasm of digestive organs: Secondary | ICD-10-CM

## 2018-05-01 DIAGNOSIS — Z8261 Family history of arthritis: Secondary | ICD-10-CM | POA: Diagnosis not present

## 2018-05-01 DIAGNOSIS — Z8249 Family history of ischemic heart disease and other diseases of the circulatory system: Secondary | ICD-10-CM | POA: Diagnosis not present

## 2018-05-01 DIAGNOSIS — K5900 Constipation, unspecified: Secondary | ICD-10-CM | POA: Diagnosis not present

## 2018-05-01 DIAGNOSIS — Z79899 Other long term (current) drug therapy: Secondary | ICD-10-CM

## 2018-05-01 DIAGNOSIS — C50411 Malignant neoplasm of upper-outer quadrant of right female breast: Secondary | ICD-10-CM | POA: Diagnosis not present

## 2018-05-01 DIAGNOSIS — Z791 Long term (current) use of non-steroidal anti-inflammatories (NSAID): Secondary | ICD-10-CM | POA: Diagnosis not present

## 2018-05-01 DIAGNOSIS — N6092 Unspecified benign mammary dysplasia of left breast: Secondary | ICD-10-CM | POA: Diagnosis not present

## 2018-05-01 DIAGNOSIS — G809 Cerebral palsy, unspecified: Secondary | ICD-10-CM | POA: Diagnosis not present

## 2018-05-01 DIAGNOSIS — Z171 Estrogen receptor negative status [ER-]: Principal | ICD-10-CM

## 2018-05-01 DIAGNOSIS — C50811 Malignant neoplasm of overlapping sites of right female breast: Secondary | ICD-10-CM | POA: Diagnosis not present

## 2018-05-01 DIAGNOSIS — K219 Gastro-esophageal reflux disease without esophagitis: Secondary | ICD-10-CM | POA: Diagnosis not present

## 2018-05-01 DIAGNOSIS — Z8371 Family history of colonic polyps: Secondary | ICD-10-CM

## 2018-05-01 DIAGNOSIS — N6012 Diffuse cystic mastopathy of left breast: Secondary | ICD-10-CM | POA: Diagnosis not present

## 2018-05-01 DIAGNOSIS — C779 Secondary and unspecified malignant neoplasm of lymph node, unspecified: Secondary | ICD-10-CM | POA: Diagnosis not present

## 2018-05-01 DIAGNOSIS — M419 Scoliosis, unspecified: Secondary | ICD-10-CM | POA: Diagnosis not present

## 2018-05-01 DIAGNOSIS — Z95828 Presence of other vascular implants and grafts: Secondary | ICD-10-CM

## 2018-05-01 DIAGNOSIS — C773 Secondary and unspecified malignant neoplasm of axilla and upper limb lymph nodes: Secondary | ICD-10-CM | POA: Diagnosis not present

## 2018-05-01 HISTORY — PX: MASTECTOMY W/ SENTINEL NODE BIOPSY: SHX2001

## 2018-05-01 HISTORY — PX: PORTACATH PLACEMENT: SHX2246

## 2018-05-01 SURGERY — MASTECTOMY WITH SENTINEL LYMPH NODE BIOPSY
Anesthesia: General | Site: Breast

## 2018-05-01 MED ORDER — SODIUM CHLORIDE 0.9 % IV SOLN
INTRAVENOUS | Status: DC | PRN
  Administered 2018-05-01: 25 ug/min via INTRAVENOUS

## 2018-05-01 MED ORDER — LIDOCAINE 2% (20 MG/ML) 5 ML SYRINGE
INTRAMUSCULAR | Status: DC | PRN
  Administered 2018-05-01: 80 mg via INTRAVENOUS

## 2018-05-01 MED ORDER — IBUPROFEN 600 MG PO TABS: 600.0000 mg | ORAL_TABLET | Freq: Four times a day (QID) | ORAL | Status: DC | PRN

## 2018-05-01 MED ORDER — MIDAZOLAM HCL 2 MG/2ML IJ SOLN
INTRAMUSCULAR | Status: DC | PRN
  Administered 2018-05-01: 1 mg via INTRAVENOUS

## 2018-05-01 MED ORDER — HEPARIN SOD (PORK) LOCK FLUSH 100 UNIT/ML IV SOLN
INTRAVENOUS | Status: AC
  Filled 2018-05-01: qty 5

## 2018-05-01 MED ORDER — SODIUM CHLORIDE 0.9 % IV SOLN
INTRAVENOUS | Status: DC | PRN
  Administered 2018-05-01: 500 mL

## 2018-05-01 MED ORDER — 0.9 % SODIUM CHLORIDE (POUR BTL) OPTIME
TOPICAL | Status: DC | PRN
  Administered 2018-05-01: 1000 mL

## 2018-05-01 MED ORDER — DEXAMETHASONE SODIUM PHOSPHATE 10 MG/ML IJ SOLN
INTRAMUSCULAR | Status: DC | PRN
  Administered 2018-05-01: 10 mg via INTRAVENOUS

## 2018-05-01 MED ORDER — LISINOPRIL 40 MG PO TABS
40.0000 mg | ORAL_TABLET | Freq: Every day | ORAL | Status: DC
  Administered 2018-05-02: 40 mg via ORAL
  Filled 2018-05-01: qty 1

## 2018-05-01 MED ORDER — FENTANYL CITRATE (PF) 250 MCG/5ML IJ SOLN
INTRAMUSCULAR | Status: AC
  Filled 2018-05-01: qty 5

## 2018-05-01 MED ORDER — SODIUM CHLORIDE 0.9 % IV SOLN
INTRAVENOUS | Status: AC
  Filled 2018-05-01: qty 1.2

## 2018-05-01 MED ORDER — METHYLENE BLUE 0.5 % INJ SOLN
INTRAVENOUS | Status: AC
  Filled 2018-05-01: qty 10

## 2018-05-01 MED ORDER — DEXAMETHASONE SODIUM PHOSPHATE 10 MG/ML IJ SOLN
INTRAMUSCULAR | Status: AC
  Filled 2018-05-01: qty 1

## 2018-05-01 MED ORDER — CEFAZOLIN SODIUM-DEXTROSE 2-4 GM/100ML-% IV SOLN
INTRAVENOUS | Status: AC
  Filled 2018-05-01: qty 100

## 2018-05-01 MED ORDER — ONDANSETRON HCL 4 MG/2ML IJ SOLN
4.0000 mg | Freq: Four times a day (QID) | INTRAMUSCULAR | Status: DC | PRN
  Administered 2018-05-01: 4 mg via INTRAVENOUS
  Filled 2018-05-01: qty 2

## 2018-05-01 MED ORDER — PROPOFOL 10 MG/ML IV BOLUS
INTRAVENOUS | Status: DC | PRN
  Administered 2018-05-01: 150 mg via INTRAVENOUS

## 2018-05-01 MED ORDER — LIDOCAINE 2% (20 MG/ML) 5 ML SYRINGE
INTRAMUSCULAR | Status: AC
  Filled 2018-05-01: qty 5

## 2018-05-01 MED ORDER — MEGESTROL ACETATE 40 MG PO TABS
60.0000 mg | ORAL_TABLET | Freq: Two times a day (BID) | ORAL | Status: DC
  Administered 2018-05-02: 60 mg via ORAL
  Filled 2018-05-01: qty 1

## 2018-05-01 MED ORDER — MEPERIDINE HCL 50 MG/ML IJ SOLN: 6.2500 mg | INTRAMUSCULAR | Status: DC | PRN

## 2018-05-01 MED ORDER — ONDANSETRON HCL 4 MG/2ML IJ SOLN
INTRAMUSCULAR | Status: AC
  Filled 2018-05-01: qty 2

## 2018-05-01 MED ORDER — ACETAMINOPHEN 500 MG PO TABS: 1000.0000 mg | ORAL_TABLET | ORAL | Status: DC

## 2018-05-01 MED ORDER — ROCURONIUM BROMIDE 50 MG/5ML IV SOSY
PREFILLED_SYRINGE | INTRAVENOUS | Status: DC | PRN
  Administered 2018-05-01: 50 mg via INTRAVENOUS

## 2018-05-01 MED ORDER — GABAPENTIN 300 MG PO CAPS
ORAL_CAPSULE | ORAL | Status: AC
  Administered 2018-05-01: 300 mg via ORAL
  Filled 2018-05-01: qty 1

## 2018-05-01 MED ORDER — MORPHINE SULFATE (PF) 2 MG/ML IV SOLN
1.0000 mg | INTRAVENOUS | Status: DC | PRN
  Administered 2018-05-01: 2 mg via INTRAVENOUS
  Filled 2018-05-01: qty 1

## 2018-05-01 MED ORDER — BUPIVACAINE-EPINEPHRINE 0.25% -1:200000 IJ SOLN
INTRAMUSCULAR | Status: DC | PRN
  Administered 2018-05-01: 4 mL

## 2018-05-01 MED ORDER — FENTANYL CITRATE (PF) 100 MCG/2ML IJ SOLN
INTRAMUSCULAR | Status: AC
  Administered 2018-05-01: 50 ug
  Filled 2018-05-01: qty 2

## 2018-05-01 MED ORDER — ROCURONIUM BROMIDE 50 MG/5ML IV SOSY
PREFILLED_SYRINGE | INTRAVENOUS | Status: AC
  Filled 2018-05-01: qty 5

## 2018-05-01 MED ORDER — OXYCODONE HCL 5 MG PO TABS
5.0000 mg | ORAL_TABLET | Freq: Four times a day (QID) | ORAL | Status: DC | PRN
  Administered 2018-05-01 – 2018-05-02 (×3): 5 mg via ORAL
  Filled 2018-05-01 (×3): qty 1

## 2018-05-01 MED ORDER — ACETAMINOPHEN 500 MG PO TABS
ORAL_TABLET | ORAL | Status: AC
  Filled 2018-05-01: qty 2

## 2018-05-01 MED ORDER — BUPIVACAINE HCL (PF) 0.25 % IJ SOLN
INTRAMUSCULAR | Status: AC
  Filled 2018-05-01: qty 30

## 2018-05-01 MED ORDER — BUPIVACAINE HCL (PF) 0.25 % IJ SOLN
INTRAMUSCULAR | Status: DC | PRN
  Administered 2018-05-01 (×2): 30 mL

## 2018-05-01 MED ORDER — GABAPENTIN 300 MG PO CAPS
300.0000 mg | ORAL_CAPSULE | ORAL | Status: AC
  Administered 2018-05-01: 300 mg via ORAL

## 2018-05-01 MED ORDER — PROPOFOL 10 MG/ML IV BOLUS
INTRAVENOUS | Status: AC
  Filled 2018-05-01: qty 20

## 2018-05-01 MED ORDER — TRAMADOL HCL 50 MG PO TABS: 50.0000 mg | ORAL_TABLET | Freq: Four times a day (QID) | ORAL | Status: DC | PRN

## 2018-05-01 MED ORDER — TECHNETIUM TC 99M SULFUR COLLOID FILTERED
1.0000 | Freq: Once | INTRAVENOUS | Status: AC | PRN
  Administered 2018-05-01: 1 via INTRADERMAL

## 2018-05-01 MED ORDER — METOCLOPRAMIDE HCL 5 MG/ML IJ SOLN: 10.0000 mg | Freq: Once | INTRAMUSCULAR | Status: DC | PRN

## 2018-05-01 MED ORDER — ONDANSETRON HCL 4 MG/2ML IJ SOLN
INTRAMUSCULAR | Status: DC | PRN
  Administered 2018-05-01: 4 mg via INTRAVENOUS

## 2018-05-01 MED ORDER — FENTANYL CITRATE (PF) 100 MCG/2ML IJ SOLN: 25.0000 ug | INTRAMUSCULAR | Status: DC | PRN

## 2018-05-01 MED ORDER — HEPARIN SODIUM (PORCINE) 5000 UNIT/ML IJ SOLN
5000.0000 [IU] | Freq: Three times a day (TID) | INTRAMUSCULAR | Status: DC
  Administered 2018-05-01 – 2018-05-02 (×2): 5000 [IU] via SUBCUTANEOUS
  Filled 2018-05-01 (×2): qty 1

## 2018-05-01 MED ORDER — ARTIFICIAL TEARS OPHTHALMIC OINT
TOPICAL_OINTMENT | OPHTHALMIC | Status: AC
  Filled 2018-05-01: qty 3.5

## 2018-05-01 MED ORDER — SODIUM CHLORIDE (PF) 0.9 % IJ SOLN
INTRAMUSCULAR | Status: AC
  Filled 2018-05-01: qty 10

## 2018-05-01 MED ORDER — MIDAZOLAM HCL 2 MG/2ML IJ SOLN
INTRAMUSCULAR | Status: AC
  Administered 2018-05-01: 2 mg
  Filled 2018-05-01: qty 2

## 2018-05-01 MED ORDER — BUPIVACAINE-EPINEPHRINE (PF) 0.25% -1:200000 IJ SOLN
INTRAMUSCULAR | Status: AC
  Filled 2018-05-01: qty 30

## 2018-05-01 MED ORDER — CEFAZOLIN SODIUM-DEXTROSE 2-4 GM/100ML-% IV SOLN
2.0000 g | INTRAVENOUS | Status: AC
  Administered 2018-05-01: 2 g via INTRAVENOUS

## 2018-05-01 MED ORDER — KCL IN DEXTROSE-NACL 20-5-0.45 MEQ/L-%-% IV SOLN
INTRAVENOUS | Status: DC
  Administered 2018-05-01 – 2018-05-02 (×2): via INTRAVENOUS
  Filled 2018-05-01 (×2): qty 1000

## 2018-05-01 MED ORDER — LACTATED RINGERS IV SOLN: INTRAVENOUS | Status: DC

## 2018-05-01 MED ORDER — ONDANSETRON 4 MG PO TBDP: 4.0000 mg | ORAL_TABLET | Freq: Four times a day (QID) | ORAL | Status: DC | PRN

## 2018-05-01 MED ORDER — SUGAMMADEX SODIUM 200 MG/2ML IV SOLN
INTRAVENOUS | Status: DC | PRN
  Administered 2018-05-01: 105.2 mg via INTRAVENOUS

## 2018-05-01 MED ORDER — FENTANYL CITRATE (PF) 250 MCG/5ML IJ SOLN
INTRAMUSCULAR | Status: DC | PRN
  Administered 2018-05-01: 25 ug via INTRAVENOUS
  Administered 2018-05-01: 50 ug via INTRAVENOUS
  Administered 2018-05-01: 100 ug via INTRAVENOUS

## 2018-05-01 MED ORDER — HEPARIN SOD (PORK) LOCK FLUSH 100 UNIT/ML IV SOLN
INTRAVENOUS | Status: DC | PRN
  Administered 2018-05-01: 400 [IU]

## 2018-05-01 MED ORDER — MIDAZOLAM HCL 2 MG/2ML IJ SOLN
INTRAMUSCULAR | Status: AC
  Filled 2018-05-01: qty 2

## 2018-05-01 MED ORDER — LACTATED RINGERS IV SOLN
INTRAVENOUS | Status: DC
  Administered 2018-05-01 (×2): via INTRAVENOUS

## 2018-05-01 MED ORDER — PANTOPRAZOLE SODIUM 40 MG PO TBEC
40.0000 mg | DELAYED_RELEASE_TABLET | Freq: Two times a day (BID) | ORAL | Status: DC
  Administered 2018-05-02: 40 mg via ORAL
  Filled 2018-05-01: qty 1

## 2018-05-01 SURGICAL SUPPLY — 69 items
APPLIER CLIP 9.375 MED OPEN (MISCELLANEOUS) ×4
BAG DECANTER FOR FLEXI CONT (MISCELLANEOUS) ×4 IMPLANT
BINDER BREAST LRG (GAUZE/BANDAGES/DRESSINGS) ×4 IMPLANT
BINDER BREAST XLRG (GAUZE/BANDAGES/DRESSINGS) IMPLANT
BIOPATCH RED 1 DISK 7.0 (GAUZE/BANDAGES/DRESSINGS) ×9 IMPLANT
BIOPATCH RED 1IN DISK 7.0MM (GAUZE/BANDAGES/DRESSINGS) ×3
CANISTER SUCT 3000ML PPV (MISCELLANEOUS) ×4 IMPLANT
CHLORAPREP W/TINT 10.5 ML (MISCELLANEOUS) ×4 IMPLANT
CHLORAPREP W/TINT 26ML (MISCELLANEOUS) ×4 IMPLANT
CLIP APPLIE 9.375 MED OPEN (MISCELLANEOUS) ×2 IMPLANT
CONT SPEC 4OZ CLIKSEAL STRL BL (MISCELLANEOUS) ×4 IMPLANT
COVER PROBE W GEL 5X96 (DRAPES) ×4 IMPLANT
COVER SURGICAL LIGHT HANDLE (MISCELLANEOUS) ×4 IMPLANT
COVER TRANSDUCER ULTRASND GEL (DRAPE) ×4 IMPLANT
COVER WAND RF STERILE (DRAPES) ×4 IMPLANT
CRADLE DONUT ADULT HEAD (MISCELLANEOUS) ×4 IMPLANT
DERMABOND ADVANCED (GAUZE/BANDAGES/DRESSINGS) ×6
DERMABOND ADVANCED .7 DNX12 (GAUZE/BANDAGES/DRESSINGS) ×6 IMPLANT
DRAIN CHANNEL 19F RND (DRAIN) ×12 IMPLANT
DRAPE C-ARM 42X72 X-RAY (DRAPES) ×4 IMPLANT
DRAPE CHEST BREAST 15X10 FENES (DRAPES) ×4 IMPLANT
DRAPE HALF SHEET 40X57 (DRAPES) ×4 IMPLANT
DRSG PAD ABDOMINAL 8X10 ST (GAUZE/BANDAGES/DRESSINGS) ×8 IMPLANT
DRSG TEGADERM 4X4.75 (GAUZE/BANDAGES/DRESSINGS) ×8 IMPLANT
ELECT CAUTERY BLADE 6.4 (BLADE) ×4 IMPLANT
ELECT REM PT RETURN 9FT ADLT (ELECTROSURGICAL) ×8
ELECTRODE REM PT RTRN 9FT ADLT (ELECTROSURGICAL) ×4 IMPLANT
EVACUATOR SILICONE 100CC (DRAIN) ×12 IMPLANT
GAUZE 4X4 16PLY RFD (DISPOSABLE) ×4 IMPLANT
GAUZE SPONGE 4X4 12PLY STRL (GAUZE/BANDAGES/DRESSINGS) ×8 IMPLANT
GEL ULTRASOUND 20GR AQUASONIC (MISCELLANEOUS) ×4 IMPLANT
GLOVE BIO SURGEON STRL SZ7.5 (GLOVE) ×4 IMPLANT
GLOVE BIOGEL PI IND STRL 7.5 (GLOVE) ×2 IMPLANT
GLOVE BIOGEL PI INDICATOR 7.5 (GLOVE) ×2
GLOVE SURG SIGNA 7.5 PF LTX (GLOVE) ×4 IMPLANT
GOWN STRL REUS W/ TWL LRG LVL3 (GOWN DISPOSABLE) ×2 IMPLANT
GOWN STRL REUS W/ TWL XL LVL3 (GOWN DISPOSABLE) ×2 IMPLANT
GOWN STRL REUS W/TWL LRG LVL3 (GOWN DISPOSABLE) ×2
GOWN STRL REUS W/TWL XL LVL3 (GOWN DISPOSABLE) ×2
ILLUMINATOR WAVEGUIDE N/F (MISCELLANEOUS) IMPLANT
INTRODUCER 13FR (INTRODUCER) IMPLANT
KIT BASIN OR (CUSTOM PROCEDURE TRAY) ×4 IMPLANT
KIT PORT POWER 8FR ISP CVUE (Port) ×4 IMPLANT
KIT TURNOVER KIT B (KITS) ×4 IMPLANT
LIGHT WAVEGUIDE WIDE FLAT (MISCELLANEOUS) IMPLANT
MARKER SKIN DUAL TIP RULER LAB (MISCELLANEOUS) ×4 IMPLANT
NEEDLE 18GX1X1/2 (RX/OR ONLY) (NEEDLE) IMPLANT
NEEDLE FILTER BLUNT 18X 1/2SAF (NEEDLE)
NEEDLE FILTER BLUNT 18X1 1/2 (NEEDLE) IMPLANT
NEEDLE HYPO 25GX1X1/2 BEV (NEEDLE) ×4 IMPLANT
NS IRRIG 1000ML POUR BTL (IV SOLUTION) ×4 IMPLANT
PACK GENERAL/GYN (CUSTOM PROCEDURE TRAY) ×4 IMPLANT
PAD ARMBOARD 7.5X6 YLW CONV (MISCELLANEOUS) ×4 IMPLANT
PENCIL BUTTON HOLSTER BLD 10FT (ELECTRODE) ×4 IMPLANT
PREFILTER EVAC NS 1 1/3-3/8IN (MISCELLANEOUS) ×4 IMPLANT
SET SHEATH INTRODUCER 10FR (MISCELLANEOUS) IMPLANT
SHEATH COOK PEEL AWAY SET 9F (SHEATH) IMPLANT
SPECIMEN JAR X LARGE (MISCELLANEOUS) ×4 IMPLANT
SUT ETHILON 2 0 FS 18 (SUTURE) ×12 IMPLANT
SUT MNCRL AB 4-0 PS2 18 (SUTURE) ×8 IMPLANT
SUT SILK 2 0 PERMA HAND 18 BK (SUTURE) ×4 IMPLANT
SUT VIC AB 3-0 SH 18 (SUTURE) ×4 IMPLANT
SYR 5ML LUER SLIP (SYRINGE) ×4 IMPLANT
SYR CONTROL 10ML LL (SYRINGE) IMPLANT
TOWEL OR 17X24 6PK STRL BLUE (TOWEL DISPOSABLE) ×4 IMPLANT
TOWEL OR 17X26 10 PK STRL BLUE (TOWEL DISPOSABLE) ×4 IMPLANT
TRAY LAPAROSCOPIC MC (CUSTOM PROCEDURE TRAY) ×4 IMPLANT
TUBE CONNECTING 12'X1/4 (SUCTIONS) ×1
TUBE CONNECTING 12X1/4 (SUCTIONS) ×3 IMPLANT

## 2018-05-01 NOTE — Discharge Instructions (Signed)
[facility] - DISCHARGE INSTRUCTIONS TO PATIENT  Activity:   No limits.  Wound Care:   Leave binder for 3 days, then may remove and bathe.  If you want to, the bandages could be left on until seen in the office next week.  Diet:  As tolerated.  Follow up appointment:  Call Dr. Pollie Friar office [facility]) at [phone] for an appointment in 1 week. (next Thursday or Friday)  Medications and dosages:  Resume your home medications.  Call Dr. Lucia Gaskins or his office  [phone]) if you have:  Temperature greater than 100.4,  Persistent nausea and vomiting,  Severe uncontrolled pain,  Redness, tenderness, or signs of infection (pain, swelling, redness, odor or green/yellow discharge around the site),  Difficulty breathing, headache or visual disturbances,  Any other questions or concerns you may have after discharge.  In an emergency, call 911 or go to an Emergency Department at a nearby hospital.

## 2018-05-01 NOTE — Anesthesia Procedure Notes (Signed)
Procedure Name: Intubation Date/Time: 05/01/2018 11:25 AM Performed by: Kathryne Hitch, CRNA Pre-anesthesia Checklist: Patient identified, Emergency Drugs available, Suction available, Patient being monitored and Timeout performed Patient Re-evaluated:Patient Re-evaluated prior to induction Oxygen Delivery Method: Circle system utilized Preoxygenation: Pre-oxygenation with 100% oxygen Induction Type: IV induction Ventilation: Mask ventilation without difficulty Laryngoscope Size: Miller and 2 Grade View: Grade I Tube type: Oral Tube size: 7.0 mm Number of attempts: 1 Airway Equipment and Method: Stylet Placement Confirmation: ETT inserted through vocal cords under direct vision,  positive ETCO2 and breath sounds checked- equal and bilateral Secured at: 21 cm Tube secured with: Tape Dental Injury: Teeth and Oropharynx as per pre-operative assessment

## 2018-05-01 NOTE — Interval H&P Note (Signed)
History and Physical Interval Note:  05/01/2018 11:08 AM  [name]  has presented today for surgery, with the diagnosis of RIGHT BREAST CANCER, LEFT BREAST ATYPICAL DUCTAL HYPERPLASIA  The various methods of treatment have been discussed with the patient and family.  Sister and sister's daughter in room.  After consideration of risks, benefits and other options for treatment, the patient has consented to  Procedure(s): BILATERAL MASTECTOMIES WITH BILATERAL SENTINEL LYMPH NODE BIOPSIES (Bilateral) INSERTION PORT-A-CATH WITH [place] (N/A) as a surgical intervention .  The patient's history has been reviewed, patient examined, no change in status, stable for surgery.  I have reviewed the patient's chart and labs.  Questions were answered to the patient's satisfaction.     Shann Medal

## 2018-05-01 NOTE — Anesthesia Procedure Notes (Signed)
Anesthesia Regional Block: Pectoralis block   Pre-Anesthetic Checklist: ,, timeout performed, Correct Patient, Correct Site, Correct Laterality, Correct Procedure, Correct Position, site marked, Risks and benefits discussed,  Surgical consent,  Pre-op evaluation,  At surgeon's request and post-op pain management  Laterality: Left  Prep: Maximum Sterile Barrier Precautions used, chloraprep       Needles:  Injection technique: Single-shot  Needle Type: Echogenic Stimulator Needle     Needle Length: 10cm      Additional Needles:   Procedures:,,,, ultrasound used (permanent image in chart),,,,  Narrative:  Start time: 05/01/2018 10:27 AM End time: 05/01/2018 10:33 AM Injection made incrementally with aspirations every 5 mL.  Performed by: Personally  Anesthesiologist: Montez Hageman, MD  Additional Notes: Risks, benefits and alternative to block explained extensively.  Patient tolerated procedure well, without complications.

## 2018-05-01 NOTE — Transfer of Care (Signed)
Immediate Anesthesia Transfer of Care Note  Patient: [name]  Procedure(s) Performed: BILATERAL MASTECTOMIES WITH LEFT SENTINEL LYMPH NODE BIOPSIES AND RIGHT AXILLARY LYMPH NODE DISSECTION (Bilateral Breast) INSERTION PORT-A-CATH WITH [place] (N/A )  Patient Location: PACU  Anesthesia Type:General  Level of Consciousness: drowsy and patient cooperative  Airway & Oxygen Therapy: Patient Spontanous Breathing  Post-op Assessment: Report given to RN and Post -op Vital signs reviewed and stable  Post vital signs: Reviewed and stable  Last Vitals:  Vitals Value Taken Time  BP 162/97 05/01/2018  3:05 PM  Temp    Pulse 76 05/01/2018  3:05 PM  Resp 22 05/01/2018  3:05 PM  SpO2 100 % 05/01/2018  3:05 PM    Last Pain:  Vitals:   05/01/18 0914  PainSc: 0-No pain         Complications: No apparent anesthesia complications

## 2018-05-01 NOTE — Progress Notes (Signed)
Pt arrived to 6n12 from PACU. Alert/ oriented, denies pain/nausea. Family at bedside

## 2018-05-01 NOTE — Op Note (Signed)
05/01/2018  5:29 PM  PATIENT:  [name], 52 y.o., female, MRN: [id number]  PREOP DIAGNOSIS:  RIGHT BREAST CANCER, LEFT BREAST ATYPICAL DUCTAL HYPERPLASIA  POSTOP DIAGNOSIS:   Right breast cancer, 10 o'clock position (T2, N1), Left breast atypical ductal hyperplasia  PROCEDURE:   Procedure(s): BILATERAL MASTECTOMIES WITH LEFT SENTINEL LYMPH NODE BIOPSIES AND RIGHT AXILLARY LYMPH NODE DISSECTION, INSERTION PORT-A-CATH WITH [place], deep sentinel lymph node biopsy on the left.  SURGEON:   [name], M.D.  ASSISTANT:   None  ANESTHESIA:   general  Anesthesiologist: Sherri Hageman, MD CRNA: Sherri Hitch, CRNA; Sherri Bench, CRNA; Sherri Dakin, CRNA  General  ASA:  3  EBL:  100  ml  BLOOD ADMINISTERED: none  DRAINS: 2 19 French Blake drains on the right and 1 19 [place] Blake drain on the left  LOCAL MEDICATIONS USED:   5 cc of 1/4% marcaine   SPECIMEN:   Left breast (suture lateral), right breast (suture lateral), left axillary sentinel lymph biopsy (counts 500, background 15), right axillary contents, highest right axillary node (Level 2 node)  COUNTS CORRECT:  YES  INDICATIONS FOR PROCEDURE:  [name] is a 52 y.o. (DOB: 21-Oct-1965) AA female who has cerebral palsy and whose primary care physician is Sherri Norlander, DO and comes for bilateral mastectomies and bilateral axillary sentinel lymph node biopsy and placement of a power port for anticipated chemotherapy.   [name] was seen in the Breast Multidisciplinary Clinic with Drs. [name] and [name]. Because of her cerebral palsy and the difficulty in following her with mammograms, it was thought best to proceed with bilateral mastectomies.  The indications and risks of the surgery were explained to the patient.  The risks include, but are not limited to, infection, bleeding, and nerve injury.   In the holding area, both her areola were injected with 1 millicurie of Technitium Sulfur Colloid to identify the  sentinel lymph node.  OPERATIVE NOTE;  The patient was taken to room # 2 at [facility] where she underwent a general anesthesia  supervised by Anesthesiologist: Sherri Hageman, MD CRNA: Sherri Hitch, CRNA; Sherri Bench, CRNA; Sherri Dakin, CRNA. Both her breast and axilla were prepped with ChloraPrep and sterilely draped.    A time-out and the surgical check list was reviewed.    I made an elliptical incision including the areola in the left breast.  I developed skin flaps medially to the lateral edge of the sternum, inferiorly to the investing fascia of the rectus abdominus muscle, laterally to the anterior edge of the latissimus dorsi muscle, and superiorly to about 2 finger breaths below the clavicle.  The left breast was reflected off the pectoralis muscle from medial to lateral.  The lateral attachments in the left axilla were divided and the breast removed.  A long suture was placed on the lateral aspect of the breast.   I dissected into the left axilla and found a deep sentinel lymph node.  The node had counts of 500 with a background count of 15.   This was sent as a separate specimen.   I then made an elliptical incision including the areola in the right breast.  I developed skin flaps medially to the lateral edge of the sternum, inferiorly to the investing fascia of the rectus abdominus muscle, laterally to the anterior edge of the latissimus dorsi muscle, and superiorly to about 2 finger breaths below the clavicle.  The right breast was  reflected off the pectoralis muscle from medial to lateral.  The lateral attachments in the right axilla were divided and the breast removed.  A long suture was placed on the lateral aspect of the breast.  There appeared to be some abnormal nodes in the lateral tail of the right mastectomy specimen.   I dissected into the right axilla and found a deep sentinel lymph node.  The node had counts of 300 with a background count of 10.   But there were several right axillary lymph nodes that were suspicious for having metastatic cancer, therefore I proceeded to do a right axillary node dissection.  I identified the right axillary vein and swept the axillary contents inferior to the vein.  I identified the right long thoracic nerve of Bell and the thoracodorsal nerve.   I brought out 2 36 F Blake drains below the inferior flaps on the right side and one 19 F Blake drain below the inferior flaps on the left side.  These were sewn in place with 2-0 Nylons.  I irrigated the wounds with 2,000 cc of fluid.   The skin was closed with interrupted 3-0 Vicryl sutures and the skin was closed with a 4-0 Monocryl.     The patient was repositioned for the power port placement.  Her arms were tucked and a roll placed behind her back.  Her upper chest was prepped with Chloroprep and sterilely draped.  Another time out was held and the surgery checklist reviewed.   The patient was placed in Trendelenburg position.  The right internal jugular vein was accessed with a 16 gauge needle using the [place] as a guide.  The guide wire threaded through the needle into the internal jugular vein.  The position of the wire was checked with fluoroscopy.   I then developed a pocket in the upper inner aspect of the right chest for the port reservoir.  I used the [employer] for venous access.  The reservoir was sewn in place with a 3-0 Vicryl suture.  The reservoir had been flushed with dilute (10 units/cc) heparin.   I then passed the silastic tubing from the reservoir incision to the right internal jugular vein stick site and used the 8 French introducer to pass it into the vein.  The tip of the silastic catheter was position at the junction of the SVC and the right atrium under fluoroscopy.  The silastic catheter was then attached to the port with the bayonet device.     The entire port and tubing were checked with fluoroscopy and then the port was flushed with  4 cc of concentrated heparin (100 units/cc).   The wounds were then closed with 3-0 vicryl subcutaneous sutures and the skin closed with a 4-0 Monocryl suture.  All the skin wounds was painted with DermaBond.   A pressure dressing was placed on her chest wounds and the chest wrapped with a breast binder.  Her needle and sponge count were correct at the end of the case.   She was transferred to the recovery room in good condition.  A chest xray is pending.  I will keep her overnight.  Sherri Overall, MD, [facility] Surgery Pager: [phone] Office phone:  [phone]

## 2018-05-01 NOTE — Anesthesia Procedure Notes (Signed)
Anesthesia Regional Block: Pectoralis block   Pre-Anesthetic Checklist: ,, timeout performed, Correct Patient, Correct Site, Correct Laterality, Correct Procedure, Correct Position, site marked, Risks and benefits discussed,  Surgical consent,  Pre-op evaluation,  At surgeon's request and post-op pain management  Laterality: Right  Prep: Maximum Sterile Barrier Precautions used, chloraprep       Needles:  Injection technique: Single-shot  Needle Type: Echogenic Stimulator Needle     Needle Length: 10cm      Additional Needles:   Procedures:,,,, ultrasound used (permanent image in chart),,,,  Narrative:  Start time: 05/01/2018 10:35 AM End time: 05/01/2018 10:40 AM Injection made incrementally with aspirations every 5 mL.  Performed by: Personally  Anesthesiologist: Montez Hageman, MD  Additional Notes: Risks, benefits and alternative to block explained extensively.  Patient tolerated procedure well, without complications.

## 2018-05-02 DIAGNOSIS — Z79899 Other long term (current) drug therapy: Secondary | ICD-10-CM | POA: Diagnosis not present

## 2018-05-02 DIAGNOSIS — L821 Other seborrheic keratosis: Secondary | ICD-10-CM | POA: Diagnosis present

## 2018-05-02 DIAGNOSIS — Z8249 Family history of ischemic heart disease and other diseases of the circulatory system: Secondary | ICD-10-CM | POA: Diagnosis not present

## 2018-05-02 DIAGNOSIS — K5900 Constipation, unspecified: Secondary | ICD-10-CM | POA: Diagnosis present

## 2018-05-02 DIAGNOSIS — G809 Cerebral palsy, unspecified: Secondary | ICD-10-CM | POA: Diagnosis present

## 2018-05-02 DIAGNOSIS — Z823 Family history of stroke: Secondary | ICD-10-CM | POA: Diagnosis not present

## 2018-05-02 DIAGNOSIS — Z791 Long term (current) use of non-steroidal anti-inflammatories (NSAID): Secondary | ICD-10-CM | POA: Diagnosis not present

## 2018-05-02 DIAGNOSIS — I10 Essential (primary) hypertension: Secondary | ICD-10-CM | POA: Diagnosis present

## 2018-05-02 DIAGNOSIS — Z800 Family history of malignant neoplasm of digestive organs: Secondary | ICD-10-CM | POA: Diagnosis not present

## 2018-05-02 DIAGNOSIS — Z8371 Family history of colonic polyps: Secondary | ICD-10-CM | POA: Diagnosis not present

## 2018-05-02 DIAGNOSIS — N6099 Unspecified benign mammary dysplasia of unspecified breast: Secondary | ICD-10-CM | POA: Diagnosis present

## 2018-05-02 DIAGNOSIS — Z993 Dependence on wheelchair: Secondary | ICD-10-CM | POA: Diagnosis not present

## 2018-05-02 DIAGNOSIS — M419 Scoliosis, unspecified: Secondary | ICD-10-CM | POA: Diagnosis present

## 2018-05-02 DIAGNOSIS — Z170 Estrogen receptor positive status [ER+]: Secondary | ICD-10-CM | POA: Diagnosis not present

## 2018-05-02 DIAGNOSIS — C50912 Malignant neoplasm of unspecified site of left female breast: Secondary | ICD-10-CM | POA: Diagnosis present

## 2018-05-02 DIAGNOSIS — C50411 Malignant neoplasm of upper-outer quadrant of right female breast: Secondary | ICD-10-CM | POA: Diagnosis present

## 2018-05-02 DIAGNOSIS — Z79891 Long term (current) use of opiate analgesic: Secondary | ICD-10-CM | POA: Diagnosis not present

## 2018-05-02 DIAGNOSIS — C439 Malignant melanoma of skin, unspecified: Secondary | ICD-10-CM | POA: Diagnosis present

## 2018-05-02 DIAGNOSIS — E7800 Pure hypercholesterolemia, unspecified: Secondary | ICD-10-CM | POA: Diagnosis present

## 2018-05-02 DIAGNOSIS — Z8261 Family history of arthritis: Secondary | ICD-10-CM | POA: Diagnosis not present

## 2018-05-02 DIAGNOSIS — K219 Gastro-esophageal reflux disease without esophagitis: Secondary | ICD-10-CM | POA: Diagnosis present

## 2018-05-02 DIAGNOSIS — M1990 Unspecified osteoarthritis, unspecified site: Secondary | ICD-10-CM | POA: Diagnosis present

## 2018-05-02 MED ORDER — ONDANSETRON 4 MG PO TBDP: 4.0000 mg | ORAL_TABLET | Freq: Four times a day (QID) | ORAL | 0 refills | Status: DC | PRN

## 2018-05-02 MED ORDER — OXYCODONE HCL 5 MG PO TABS: 5.0000 mg | ORAL_TABLET | Freq: Four times a day (QID) | ORAL | 0 refills | Status: DC | PRN

## 2018-05-02 MED ORDER — IBUPROFEN 600 MG PO TABS: 600.0000 mg | ORAL_TABLET | Freq: Four times a day (QID) | ORAL | 0 refills | Status: DC | PRN

## 2018-05-02 NOTE — Discharge Summary (Signed)
[facility] Discharge Summary   Patient ID: [name] MRN: [id number] DOB/AGE: 10/31/1965 52 y.o.  Admit date: 05/01/2018 Discharge date: 05/02/2018  Admitting Diagnosis: Right breast cancer, 10 o'clock position (T2, N1), Left breast atypical ductal hyperplasia  Discharge Diagnosis Patient Active Problem List   Diagnosis Date Noted  . Breast cancer, stage 2, right ([place]) 05/01/2018  . Malignant neoplasm of upper-outer quadrant of right breast in female, estrogen receptor positive ([place]) 03/23/2018  . Pre-diabetes 12/24/2016  . Healthcare maintenance 12/26/2014  . Headache 12/26/2014  . Frequency of micturition 09/03/2013  . Need for Tdap vaccination 09/03/2013  . Pain in limb 04/12/2013  . Wart viral 04/12/2013  . HLD (hyperlipidemia) 09/16/2012  . Vitamin D deficiency 09/16/2012  . Hyperglycemia 09/16/2012  . HTN (hypertension) 09/16/2012  . 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 DENTAL PAIN 07/11/2009  . Infantile cerebral palsy ([place]) 11/23/2008  . GERD 11/23/2008  . Constipation 11/23/2008  . Dysphagia 11/23/2008    Consultants None  Imaging: X-ray Chest Pa Or Ap  Result Date: 05/01/2018 CLINICAL DATA:  Status post porta catheter placement. EXAM: CHEST  1 VIEW COMPARISON:  12/09/2013. FINDINGS: Interval right jugular porta catheter with its tip in the upper right atrium, approximately 1 cm inferior to the superior cavoatrial junction. No pneumothorax. Normal sized heart. Clear lungs. Stable moderate scoliosis. Right axillary surgical clips and 2 tube 6 stenting along the lateral aspect of the right chest. Small amount of bilateral subcutaneous emphysema. IMPRESSION: 1. Right jugular porta catheter tip in the upper right atrium, approximately 1 cm inferior to the superior cavoatrial junction. 2. No pneumothorax. Electronically Signed   By: Claudie Revering M.D.   On: 05/01/2018 16:37   Nm Sentinel Node Inj-no Rpt (breast)  Result Date: 05/01/2018 Sulfur  colloid was injected by the nuclear medicine technologist for melanoma sentinel node.   Dg Fluoro Guide Cv Line-no Report  Result Date: 05/01/2018 Fluoroscopy was utilized by the requesting physician.  No radiographic interpretation.    Procedures Dr. Lucia Gaskins (05/01/18) - BILATERAL MASTECTOMIES WITH LEFT SENTINEL LYMPH NODE BIOPSIES AND RIGHT AXILLARY LYMPH NODE DISSECTION, INSERTION PORT-A-CATH WITH [place], deep sentinel lymph node biopsy on the left.  Hospital Course:  [name] is a 52yo female PMH CP who was admitted to [facility] 12/20 for the above listed elective procedure. Tolerated procedure well and was transferred to the floor.  Diet was advanced as tolerated.  On POD1 the patient was voiding well, tolerating diet, pain well controlled, vital signs stable, incisions c/d/i and felt stable for discharge home.  Patient will follow up as below and knows to call with questions or concerns.    I have personally reviewed the patients medication history on the [place] controlled substance database.    Physical Exam: General:  Alert, NAD, pleasant, comfortable Pulm: effort normal Chest: incisions cdi, JP drain x3 with serosanguinous fluid in bulbs Abd:  Soft, ND, NT, +BS  Allergies as of 05/02/2018      Reactions   Hydrocodone Nausea And Vomiting, Other (See Comments)   Vomiting and hallunications      Medication List    TAKE these medications   ibuprofen 600 MG tablet Commonly known as:  ADVIL,MOTRIN Take 1 tablet (600 mg total) by mouth every 6 (six) hours as needed (for mild pain not relieved by other medications.).   lisinopril 40 MG tablet Commonly known as:  PRINIVIL,ZESTRIL Take 1 tablet (40 mg total) by mouth daily.   megestrol 40 MG tablet Commonly  known as:  MEGACE Take 3 tablets every day What changed:    how much to take  how to take this  when to take this   multivitamin tablet Take 1 tablet by mouth daily.   ondansetron 4 MG disintegrating tablet Commonly  known as:  ZOFRAN-ODT Take 1 tablet (4 mg total) by mouth every 6 (six) hours as needed for nausea.   oxyCODONE 5 MG immediate release tablet Commonly known as:  Oxy IR/ROXICODONE Take 1 tablet (5 mg total) by mouth every 6 (six) hours as needed for severe pain.   pantoprazole 40 MG tablet Commonly known as:  PROTONIX TAKE 1 TABLET BY MOUTH 30 MINUTES PRIOR TO BREAKFAST AND SUPPER What changed:    how much to take  how to take this  when to take this   polyethylene glycol packet Commonly known as:  MIRALAX / GLYCOLAX Take 17 g by mouth daily as needed for moderate constipation.   pravastatin 40 MG tablet Commonly known as:  PRAVACHOL Take 1 tablet (40 mg total) by mouth daily. What changed:  when to take this        Follow-up Information    Alphonsa Overall, MD. Call.   Specialty:  General Surgery Contact information: [place] [address] [phone]           Signed: Wellington Hampshire, [facility] Surgery 05/02/2018, 9:22 AM Pager: [phone] Mon 7:00 am -11:30 AM Tues-Fri 7:00 am-4:30 pm Sat-Sun 7:00 am-11:30 am

## 2018-05-02 NOTE — Progress Notes (Signed)
[name] to be D/C'd  per MD order. Discussed with the patient and all questions fully answered.  VSS, Skin clean, dry and intact without evidence of skin break down, no evidence of skin tears noted.  IV catheter discontinued intact. Site without signs and symptoms of complications. Dressing and pressure applied.  An After Visit Summary was printed and given to the patient. Patient received prescription.  D/c education completed with patient/family including follow up instructions, medication list, d/c activities limitations if indicated, with other d/c instructions as indicated by MD - patient able to verbalize understanding, all questions fully answered.   Patient instructed to return to ED, call 911, or call MD for any changes in condition.   Patient to be escorted via [place], and D/C home via private auto.

## 2018-05-04 ENCOUNTER — Encounter (HOSPITAL_COMMUNITY): Payer: Self-pay | Admitting: Surgery

## 2018-05-04 NOTE — Anesthesia Postprocedure Evaluation (Signed)
Anesthesia Post Note  Patient: [name]  Procedure(s) Performed: BILATERAL MASTECTOMIES WITH LEFT SENTINEL LYMPH NODE BIOPSIES AND RIGHT AXILLARY LYMPH NODE DISSECTION (Bilateral Breast) INSERTION PORT-A-CATH WITH [place] (N/A )     Patient location during evaluation: PACU Anesthesia Type: General Level of consciousness: awake and alert Pain management: pain level controlled Vital Signs Assessment: post-procedure vital signs reviewed and stable Respiratory status: spontaneous breathing, nonlabored ventilation, respiratory function stable and patient connected to nasal cannula oxygen Cardiovascular status: blood pressure returned to baseline and stable Postop Assessment: no apparent nausea or vomiting Anesthetic complications: no    Last Vitals:  Vitals:   05/02/18 0521 05/02/18 0829  BP: (!) 168/106 (!) 185/91  Pulse: 72   Resp: 20   Temp: 36.9 C   SpO2: 100%     Last Pain:  Vitals:   05/02/18 0826  TempSrc:   PainSc: 5    Pain Goal: Patients Stated Pain Goal: 2 (05/02/18 0826)               Montez Hageman

## 2018-05-08 ENCOUNTER — Other Ambulatory Visit: Payer: Self-pay | Admitting: Hematology and Oncology

## 2018-05-11 ENCOUNTER — Inpatient Hospital Stay: Payer: Medicare Other | Attending: Hematology and Oncology | Admitting: Hematology and Oncology

## 2018-05-11 ENCOUNTER — Telehealth: Payer: Self-pay | Admitting: Hematology and Oncology

## 2018-05-11 ENCOUNTER — Inpatient Hospital Stay: Payer: Medicare Other

## 2018-05-11 VITALS — BP 141/119 | HR 85 | Temp 99.6°F | Resp 16 | Ht 62.0 in | Wt 117.6 lb

## 2018-05-11 DIAGNOSIS — C50411 Malignant neoplasm of upper-outer quadrant of right female breast: Secondary | ICD-10-CM | POA: Diagnosis not present

## 2018-05-11 DIAGNOSIS — Z79899 Other long term (current) drug therapy: Secondary | ICD-10-CM

## 2018-05-11 DIAGNOSIS — Z170 Estrogen receptor positive status [ER+]: Secondary | ICD-10-CM

## 2018-05-11 DIAGNOSIS — Z9221 Personal history of antineoplastic chemotherapy: Secondary | ICD-10-CM | POA: Diagnosis not present

## 2018-05-11 DIAGNOSIS — Z9012 Acquired absence of left breast and nipple: Secondary | ICD-10-CM | POA: Diagnosis not present

## 2018-05-11 MED ORDER — DEXAMETHASONE 4 MG PO TABS: 4.0000 mg | ORAL_TABLET | Freq: Every day | ORAL | 0 refills | Status: DC

## 2018-05-11 MED ORDER — LIDOCAINE-PRILOCAINE 2.5-2.5 % EX CREA: TOPICAL_CREAM | CUTANEOUS | 3 refills | Status: DC

## 2018-05-11 MED ORDER — PROCHLORPERAZINE MALEATE 10 MG PO TABS: 10.0000 mg | ORAL_TABLET | Freq: Four times a day (QID) | ORAL | 1 refills | Status: DC | PRN

## 2018-05-11 MED ORDER — ONDANSETRON HCL 8 MG PO TABS: 8.0000 mg | ORAL_TABLET | Freq: Two times a day (BID) | ORAL | 1 refills | Status: DC | PRN

## 2018-05-11 NOTE — Progress Notes (Signed)
Patient Care Team: Janora Norlander, DO as PCP - General (Family Medicine) Danie Binder, MD (Gastroenterology) Alphonsa Overall, MD as Consulting Physician (General Surgery) Nicholas Lose, MD as Consulting Physician (Hematology and Oncology) Kyung Rudd, MD as Consulting Physician (Radiation Oncology)  DIAGNOSIS:  Encounter Diagnosis  Name Primary?  . Malignant neoplasm of upper-outer quadrant of right breast in female, estrogen receptor positive ([name]) Yes    SUMMARY OF ONCOLOGIC HISTORY:   Malignant neoplasm of upper-outer quadrant of right breast in female, estrogen receptor positive ([place])   03/18/2018 Initial Diagnosis    Screening detected bilateral breast abnormalities.  Right breast mass UOQ 2.1 cm at 10 o'clock position: Grade 1 ILC ER 70%, PR 0%, HER-2 +3+ by IHC, Ki-67 5%, suspected lymph node in the axilla could not be biopsied; left breast calcifications by ultrasound not visible but 5 cm cystic/tubular structure which on biopsy was fibrocystic change with ADHl; T2N0 stage IIa clinical stage    05/01/2018 Surgery    Bilateral mastectomies: Left mastectomy: DCIS intermediate grade 0.9 cm margins negative Tis NX stage 0; right mastectomy: ILC, grade 3, 3.5 cm, with LCIS, perineural invasion present, margins negative, 15/22 lymph nodes positive with extracapsular extension, ER 90%, PR 0%, HER-2 +(IHC 3+), Ki-67 5%, T2N3 Stage 3B     05/11/2018 Cancer Staging    Staging form: Breast, AJCC 8th Edition - Pathologic stage from 05/11/2018: Stage IIIB (pT2, pN3, cM0, G3, ER+, PR-, HER2+) - Signed by Nicholas Lose, MD on 05/11/2018    06/01/2018 -  Chemotherapy    The patient had palonosetron (ALOXI) injection 0.25 mg, 0.25 mg, Intravenous,  Once, 0 of 6 cycles pegfilgrastim-cbqv (UDENYCA) injection 6 mg, 6 mg, Subcutaneous, Once, 0 of 6 cycles trastuzumab (HERCEPTIN) 420 mg in sodium chloride 0.9 % 250 mL chemo infusion, 8 mg/kg = 420 mg, Intravenous,  Once, 0 of 17  cycles CARBOplatin (PARAPLATIN) 370 mg in sodium chloride 0.9 % 100 mL chemo infusion, 370 mg (100 % of original dose 370.4 mg), Intravenous,  Once, 0 of 6 cycles Dose modification:   (original dose 370.4 mg, Cycle 1) DOCEtaxel (TAXOTERE) 80 mg in sodium chloride 0.9 % 250 mL chemo infusion, 50 mg/m2 = 80 mg (100 % of original dose 50 mg/m2), Intravenous,  Once, 0 of 6 cycles Dose modification: 50 mg/m2 (original dose 50 mg/m2, Cycle 1, Reason: Provider Judgment) pertuzumab (PERJETA) 420 mg in sodium chloride 0.9 % 250 mL chemo infusion, 420 mg (100 % of original dose 420 mg), Intravenous, Once, 0 of 17 cycles Dose modification: 420 mg (original dose 420 mg, Cycle 1, Reason: Provider Judgment) fosaprepitant (EMEND) 150 mg, dexamethasone (DECADRON) 12 mg in sodium chloride 0.9 % 145 mL IVPB, , Intravenous,  Once, 0 of 6 cycles  for chemotherapy treatment.      CHIEF COMPLIANT: Follow-up to discuss the pathology report from recent bilateral mastectomies  INTERVAL HISTORY: [name] is a 52 year old lady with above-mentioned history of right breast cancer underwent bilateral mastectomies and is here today to discuss the pathology report.  She is in some discomfort and pain related to the surgeries.  She is taking pain medications.  She has bilateral drains in place.  She has severe muscle contractures and uses wheelchair for ambulation  REVIEW OF SYSTEMS:   Constitutional: Denies fevers, chills or abnormal weight loss Eyes: Denies blurriness of vision Ears, nose, mouth, throat, and face: Denies mucositis or sore throat Respiratory: Denies cough, dyspnea or wheezes Cardiovascular: Denies palpitation, chest discomfort  Gastrointestinal:  Denies nausea, heartburn or change in bowel habits Skin: Denies abnormal skin rashes Lymphatics: Denies new lymphadenopathy or easy bruising Neurological: Severe muscle contractures Behavioral/Psych: Mood is stable, no new changes  Extremities: No lower  extremity edema Breast: Recent bilateral mastectomies All other systems were reviewed with the patient and are negative.  I have reviewed the past medical history, past surgical history, social history and family history with the patient and they are unchanged from previous note.  ALLERGIES:  is allergic to hydrocodone.  MEDICATIONS:  Current Outpatient Medications  Medication Sig Dispense Refill  . dexamethasone (DECADRON) 4 MG tablet Take 1 tablet (4 mg total) by mouth daily. Take 1 tablet day before chemo and 1 tablet day after chemo 12 tablet 0  . ibuprofen (ADVIL,MOTRIN) 600 MG tablet Take 1 tablet (600 mg total) by mouth every 6 (six) hours as needed (for mild pain not relieved by other medications.). 30 tablet 0  . lidocaine-prilocaine (EMLA) cream Apply to affected area once 30 g 3  . lisinopril (PRINIVIL,ZESTRIL) 40 MG tablet Take 1 tablet (40 mg total) by mouth daily. 90 tablet 1  . megestrol (MEGACE) 40 MG tablet Take 3 tablets every day (Patient taking differently: Take 60 mg by mouth 2 (two) times daily with a meal. Take 3 tablets every day) 270 tablet 11  . Multiple Vitamin (MULTIVITAMIN) tablet Take 1 tablet by mouth daily.    . ondansetron (ZOFRAN) 8 MG tablet Take 1 tablet (8 mg total) by mouth 2 (two) times daily as needed (Nausea or vomiting). Begin 4 days after chemotherapy. 30 tablet 1  . ondansetron (ZOFRAN-ODT) 4 MG disintegrating tablet Take 1 tablet (4 mg total) by mouth every 6 (six) hours as needed for nausea. 15 tablet 0  . oxyCODONE (OXY IR/ROXICODONE) 5 MG immediate release tablet Take 1 tablet (5 mg total) by mouth every 6 (six) hours as needed for severe pain. 25 tablet 0  . pantoprazole (PROTONIX) 40 MG tablet TAKE 1 TABLET BY MOUTH 30 MINUTES PRIOR TO BREAKFAST AND SUPPER (Patient taking differently: Take 40 mg by mouth 2 (two) times daily before a meal. TAKE 1 TABLET BY MOUTH 30 MINUTES PRIOR TO BREAKFAST AND SUPPER) 180 tablet 1  . polyethylene glycol (MIRALAX  / GLYCOLAX) packet Take 17 g by mouth daily as needed for moderate constipation.    . pravastatin (PRAVACHOL) 40 MG tablet Take 1 tablet (40 mg total) by mouth daily. (Patient taking differently: Take 40 mg by mouth every evening. ) 90 tablet 3  . prochlorperazine (COMPAZINE) 10 MG tablet Take 1 tablet (10 mg total) by mouth every 6 (six) hours as needed (Nausea or vomiting). 30 tablet 1   No current facility-administered medications for this visit.     PHYSICAL EXAMINATION: ECOG PERFORMANCE STATUS: 1 - Symptomatic but completely ambulatory  Vitals:   05/11/18 0940 05/11/18 0941  BP: (!) 165/109 (!) 141/119  Pulse: 85   Resp: 16   Temp: 99.6 F (37.6 C)   SpO2: 98%    Filed Weights   05/11/18 0940  Weight: 117 lb 9.6 oz (53.3 kg)    GENERAL:alert, no distress and comfortable SKIN: skin color, texture, turgor are normal, no rashes or significant lesions EYES: normal, Conjunctiva are pink and non-injected, sclera clear OROPHARYNX:no exudate, no erythema and lips, buccal mucosa, and tongue normal  NECK: supple, thyroid normal size, non-tender, without nodularity LYMPH:  no palpable lymphadenopathy in the cervical, axillary or inguinal LUNGS: clear to auscultation and percussion with  normal breathing effort HEART: regular rate & rhythm and no murmurs and no lower extremity edema ABDOMEN:abdomen soft, non-tender and normal bowel sounds MUSCULOSKELETAL:no cyanosis of digits and no clubbing  NEURO: alert & oriented x 3 with fluent speech, no focal motor/sensory deficits EXTREMITIES: No lower extremity edema   LABORATORY DATA:  I have reviewed the data as listed CMP Latest Ref Rng & Units 04/21/2018 03/25/2018 12/19/2017  Glucose 70 - 99 mg/dL 104(H) 92 96  BUN 6 - 20 mg/dL _0 Creatinine 0.44 - 1.00 mg/dL 0.54 0.67 0.58  Sodium 135 - 145 mmol/L 140 144 146(H)  Potassium 3.5 - 5.1 mmol/L 3.4(L) 3.7 3.2(L)  Chloride 98 - 111 mmol/L 107 110 109  CO2 22 - 32 mmol/L _1 Calcium 8.9 - 10.3 mg/dL 9.4 9.4 9.7  Total Protein 6.5 - 8.1 g/dL - 7.5 -  Total Bilirubin 0.3 - 1.2 mg/dL - 0.3 -  Alkaline Phos 38 - 126 U/L - 193(H) -  AST 15 - 41 U/L - 15 -  ALT 0 - 44 U/L - 21 -    Lab Results  Component Value Date   WBC 7.8 04/21/2018   HGB 12.2 04/21/2018   HCT 37.8 04/21/2018   MCV 93.3 04/21/2018   PLT 351 04/21/2018   NEUTROABS 4.1 03/25/2018    ASSESSMENT & PLAN:  Malignant neoplasm of upper-outer quadrant of right breast in female, estrogen receptor positive ([place]) 05/01/18: Bilateral mastectomies: Left mastectomy: DCIS intermediate grade 0.9 cm margins negative Tis NX stage 0; right mastectomy: ILC, grade 3, 3.5 cm, with LCIS, perineural invasion present, margins negative, 15/22 lymph nodes positive with extracapsular extension, ER 90%, PR 0%, HER-2 +(IHC 3+), Ki-67 5%, T2N3 Stage 3B  Pathology counseling: I discussed the final pathology report of the patient provided  a copy of this report. I discussed the margins as well as lymph node surgeries. We also discussed the final staging along with previously performed ER/PR and HER-2/neu testing.  Recommendation: 1.  Staging scans 2.  Adjuvant chemotherapy with [place] followed by Herceptin Perjeta maintenance for 1 year 3.  Adjuvant radiation therapy given the number of lymph nodes involved 4.  Adjuvant antiestrogen therapy (patient is still premenopausal) 5.  Followed by neratinib  Chemotherapy Counseling: I discussed the risks and benefits of chemotherapy including the risks of nausea/ vomiting, risk of infection from low WBC count, fatigue due to chemo or anemia, bruising or bleeding due to low platelets, mouth sores, loss/ change in taste and decreased appetite. Liver and kidney function will be monitored through out chemotherapy as abnormalities in liver and kidney function may be a side effect of treatment. Cardiac dysfunction due to Herceptin and Perjeta were discussed in detail. Risk of  permanent bone marrow dysfunction and leukemia due to chemo were also discussed.  Because the patient is wheelchair-bound and has multiple comorbidities, I recommended giving her a lower dosage of chemotherapy with the carboplatin AUC of 4 and her Taxotere at 60 mg/m.  I would also like to not give Perjeta loading dose.  I am very concerned about diarrhea risk. We will have to give her a prescription for weekly.  Return to clinic in 3 weeks to start chemo.  Orders Placed This Encounter  Procedures  . CT Abdomen Pelvis W Contrast    Standing Status:   Future    Standing Expiration Date:   05/11/2019    Order Specific Question:   ** REASON FOR EXAM (FREE  TEXT)    Answer:   Stage 3 B breast cancer staging    Order Specific Question:   If indicated for the ordered procedure, I authorize the administration of contrast media per Radiology protocol    Answer:   Yes    Order Specific Question:   Is patient pregnant?    Answer:   No    Order Specific Question:   Preferred imaging location?    Answer:   [facility]    Order Specific Question:   Is Oral Contrast requested for this exam?    Answer:   Yes, Per Radiology protocol    Order Specific Question:   Radiology Contrast Protocol - do NOT remove file path    Answer:   \\[email or web address].[email or web address]  . CT Chest W Contrast    Standing Status:   Future    Standing Expiration Date:   05/11/2019    Order Specific Question:   ** REASON FOR EXAM (FREE TEXT)    Answer:   Stage 3 breast cancer staging    Order Specific Question:   If indicated for the ordered procedure, I authorize the administration of contrast media per Radiology protocol    Answer:   Yes    Order Specific Question:   Is patient pregnant?    Answer:   No    Order Specific Question:   Preferred imaging location?    Answer:   [facility]    Order Specific Question:   Radiology Contrast Protocol - do NOT remove file path    Answer:    \\[email or web address].pdf  . NM Bone Scan Whole Body    Standing Status:   Future    Standing Expiration Date:   05/11/2019    Order Specific Question:   ** REASON FOR EXAM (FREE TEXT)    Answer:   Breast cancer stage 3 staging    Order Specific Question:   If indicated for the ordered procedure, I authorize the administration of a radiopharmaceutical per Radiology protocol    Answer:   Yes    Order Specific Question:   Is the patient pregnant?    Answer:   No    Order Specific Question:   Preferred imaging location?    Answer:   [facility]    Order Specific Question:   Radiology Contrast Protocol - do NOT remove file path    Answer:   \\[email or web address]\[email or web address]  . CBC with Differential ([place] Only)    Standing Status:   Standing    Number of Occurrences:   20    Standing Expiration Date:   05/12/2019  . CMP ([facility] only)    Standing Status:   Standing    Number of Occurrences:   20    Standing Expiration Date:   05/12/2019  . PHYSICIAN COMMUNICATION ORDER    A baseline Echo/[place] should be obtained prior to initiation of Herceptin, at 3, 6, 9 months during Herceptin  Treatment.   The patient has a good understanding of the overall plan. she agrees with it. she will call with any problems that may develop before the next visit here.   Harriette Ohara, MD 05/11/18

## 2018-05-11 NOTE — Progress Notes (Signed)
START ON PATHWAY REGIMEN - Breast     A cycle is every 21 days:     Docetaxel      Carboplatin      Trastuzumab-xxxx      Trastuzumab-xxxx      Pertuzumab      Pertuzumab   **Always confirm dose/schedule in your pharmacy ordering system**  Patient Characteristics: Postoperative without Neoadjuvant Therapy (Pathologic Staging), Invasive Disease, Adjuvant Therapy, HER2 Positive, ER Positive, Node Positive Therapeutic Status: Postoperative without Neoadjuvant Therapy (Pathologic Staging) AJCC Grade: G3 AJCC N Category: pN3 AJCC M Category: cM0 ER Status: Positive (+) AJCC 8 Stage Grouping: IIIB HER2 Status: Positive (+) Oncotype Dx Recurrence Score: Not Appropriate AJCC T Category: pT2 PR Status: Negative (-) Intent of Therapy: Curative Intent, Discussed with Patient

## 2018-05-11 NOTE — Telephone Encounter (Signed)
Gave avs and calendar ° °

## 2018-05-11 NOTE — Assessment & Plan Note (Signed)
05/01/18: Bilateral mastectomies: Left mastectomy: DCIS intermediate grade 0.9 cm margins negative Tis NX stage 0; right mastectomy: ILC, grade 3, 3.5 cm, with LCIS, perineural invasion present, margins negative, 15/22 lymph nodes positive with extracapsular extension, ER 90%, PR 0%, HER-2 +(IHC 3+), Ki-67 5%, T2N3 Stage 3B  Pathology counseling: I discussed the final pathology report of the patient provided  a copy of this report. I discussed the margins as well as lymph node surgeries. We also discussed the final staging along with previously performed ER/PR and HER-2/neu testing.  Recommendation: 1.  Staging scans 2.  Adjuvant chemotherapy with [place] followed by Herceptin Perjeta maintenance for 1 year 3.  Adjuvant radiation therapy given the number of lymph nodes involved 4.  Adjuvant antiestrogen therapy (patient is still premenopausal)  Return to clinic in 3 weeks to start chemo.

## 2018-05-26 ENCOUNTER — Ambulatory Visit: Payer: Medicare Other | Admitting: Family Medicine

## 2018-05-28 ENCOUNTER — Encounter: Payer: Self-pay | Admitting: Physical Therapy

## 2018-05-28 ENCOUNTER — Other Ambulatory Visit: Payer: Self-pay

## 2018-05-28 ENCOUNTER — Ambulatory Visit: Payer: Medicare Other | Attending: Surgery | Admitting: Physical Therapy

## 2018-05-28 DIAGNOSIS — Z483 Aftercare following surgery for neoplasm: Secondary | ICD-10-CM | POA: Diagnosis not present

## 2018-05-28 DIAGNOSIS — R293 Abnormal posture: Secondary | ICD-10-CM | POA: Diagnosis not present

## 2018-05-28 DIAGNOSIS — C50411 Malignant neoplasm of upper-outer quadrant of right female breast: Secondary | ICD-10-CM | POA: Diagnosis not present

## 2018-05-28 DIAGNOSIS — Z170 Estrogen receptor positive status [ER+]: Secondary | ICD-10-CM | POA: Diagnosis not present

## 2018-05-28 NOTE — Therapy (Signed)
[place], [place], [address] Phone: [phone]   Fax:  [phone]  Physical Therapy Treatment  Patient Details  Name: [name] MRN: [id number] Date of Birth: 1965-10-15 Referring Provider (PT): Dr. Alphonsa Overall   Encounter Date: 05/28/2018  PT End of Session - 05/28/18 1207    Visit Number  2    Number of Visits  2    PT Start Time  7591    PT Stop Time  6384    PT Time Calculation (min)  47 min    Activity Tolerance  Patient tolerated treatment well    Behavior During Therapy  [facility] for tasks assessed/performed       Past Medical History:  Diagnosis Date  . Arthritis   . Cerebral palsy ([place])   . Complication of anesthesia 12/09/2013   Respiratory instability with 10 mg Propofol.Poor gag reflex.  . Constipation - functional   . Dysphagia 2003   2O to GERD/HH, ?difficulty with large food bolus due to neuromuscular weakness  . Epigastric pain MAY 2012 ABD [place] NL GBLIVPAN   2o to GERD V. NON-ULCER DYSPEPSIA  . GERD (gastroesophageal reflux disease) 2007   BPE NL ESO, REFLUX  . Hiatal hernia   . HIATAL HERNIA 11/23/2008   Qualifier: Diagnosis of  By: Zeb Comfort    . HTN (hypertension)   . Hyperlipidemia     Past Surgical History:  Procedure Laterality Date  . COLONOSCOPY  APR 2009 with proprofol   SLF: SIMPLE ADENOMAS  . MASTECTOMY W/ SENTINEL NODE BIOPSY Bilateral 05/01/2018   Procedure: BILATERAL MASTECTOMIES WITH LEFT SENTINEL LYMPH NODE BIOPSIES AND RIGHT AXILLARY LYMPH NODE DISSECTION;  Surgeon: Alphonsa Overall, MD;  Location: [place];  Service: General;  Laterality: Bilateral;  . PORTACATH PLACEMENT N/A 05/01/2018   Procedure: INSERTION PORT-A-CATH WITH [place];  Surgeon: Alphonsa Overall, MD;  Location: [place];  Service: General;  Laterality: N/A;  . UPPER GASTROINTESTINAL ENDOSCOPY  MAY 2012 DIL 16 mm   SLF: NO DEFINITE STRICTURE APPRECIATED  . UPPER GASTROINTESTINAL ENDOSCOPY  FEB  2009-HYPOXIA REQUIRING NARCAN, & VERSED, D50V4   SLF: NL ESO, FUNDIC GLAND POLYPS  . UPPER GASTROINTESTINAL ENDOSCOPY  w/ DIL 2003, 2004, 2006    There were no vitals filed for this visit.  Subjective Assessment - 05/28/18 1120    Subjective  Patient underwent a bilateral mastectomy on 05/01/18. She had 0/5 axillary nodes positive on the left side and 14/16 positive on the right side. Drains were removed on 05/18/2018. Port was placed during surgery. Chemo starts on 06/10/2018.     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 Patient underwent a bilateral mastectomy on 05/01/18. She had 0/5 axillary nodes positive on the left side and 14/16 positive on the right side. It is ER positive, PR negative and HER2 positive with a Ki67 of 5%. She has severe cerebral palsy. She uses a wheelchair and is unable to ambulate. Her sister reports she is 100% dependent with transfers and the majority of self care. She reports she can understand what we are saying but it is difficult to [place] her when she speaks.    Patient Stated Goals  See if I'm back to where I was    Currently in Pain?  No/denies         [facility] PT Assessment - 05/28/18 0001      Assessment   Medical Diagnosis  Right breast cancer    Referring Provider (PT)  Dr. Alphonsa Overall    Onset Date/Surgical Date  05/01/18    Hand Dominance  Right;Left    Prior Therapy  Baselines      Precautions   Precautions  Fall    Precaution Comments  Difficulty with communication if sister is not present; dependent with transfers; BUE lymphedema risk      Restrictions   Weight Bearing Restrictions  No      Home Environment   Living Environment  Private residence    Living Arrangements  Other relatives   Sister and has a Optician, dispensing 3x/week   Available Help at Discharge  Family      Prior Function   Level of Independence  Independent with basic ADLs;Requires assistive  device for independence;Needs assistance with homemaking;Needs assistance with transfers    Vocation  Part time employment    Vocation Requirements  Not currently working    Leisure  She rides a recombant bike and gets in the pool at [employer] sometimes but has not returned to that yet      Cognition   Overall Cognitive Status  Within Functional Limits for tasks assessed      Observation/Other Assessments   Observations  Incisions appear to be healing well; no sign of redness or irritation.       Posture/Postural Control   Posture/Postural Control  Postural limitations    Postural Limitations  Rounded Shoulders;Forward head;Increased thoracic kyphosis;Flexed trunk   As noted in wheelchair     ROM / Strength   AROM / PROM / Strength  AROM;PROM      AROM   AROM Assessment Site  Shoulder    Right/Left Shoulder  Right;Left    Right Shoulder Extension  40 Degrees    Right Shoulder Flexion  91 Degrees    Right Shoulder ABduction  90 Degrees    Right Shoulder Internal Rotation  21 Degrees    Right Shoulder External Rotation  82 Degrees    Left Shoulder Extension  40 Degrees    Left Shoulder Flexion  105 Degrees    Left Shoulder ABduction  90 Degrees    Left Shoulder Internal Rotation  56 Degrees    Left Shoulder External Rotation  30 Degrees      PROM   PROM Assessment Site  Shoulder    Right/Left Shoulder  Right;Left    Right Shoulder Flexion  114 Degrees    Right Shoulder ABduction  101 Degrees    Left Shoulder Flexion  110 Degrees    Left Shoulder ABduction  127 Degrees        LYMPHEDEMA/ONCOLOGY QUESTIONNAIRE - 05/28/18 1130      Type   Cancer Type  Right breast cancer      Surgeries   Mastectomy Date  05/01/18    Sentinel Lymph Node Biopsy Date  05/01/18   On left side 0/5 positive   Axillary Lymph Node Dissection Date  05/01/18   On right side 14/16 positive   Number Lymph Nodes Removed  --   16 on right; 5 on left     Treatment   Active Chemotherapy Treatment   No    Past Chemotherapy Treatment  No    Active Radiation Treatment  No    Past Radiation Treatment  No    Current Hormone Treatment  No    Past Hormone Therapy  No      What other symptoms do you  have   Are you Having Heaviness or Tightness  No    Are you having Pain  No    Are you having pitting edema  No    Is it Hard or Difficult finding clothes that fit  No    Do you have infections  No    Is there Decreased scar mobility  No    Stemmer Sign  No      Lymphedema Assessments   Lymphedema Assessments  Upper extremities      Right Upper Extremity Lymphedema   10 cm Proximal to Olecranon Process  26.5 cm    Olecranon Process  22.5 cm    10 cm Proximal to Ulnar Styloid Process  18.9 cm    Just Proximal to Ulnar Styloid Process  14.2 cm    Across Hand at [employer]  16.5 cm    At [place] of 2nd Digit  5.3 cm      Left Upper Extremity Lymphedema   10 cm Proximal to Olecranon Process  28.5 cm    Olecranon Process  22.5 cm    10 cm Proximal to Ulnar Styloid Process  19.6 cm    Just Proximal to Ulnar Styloid Process  14.3 cm    Across Hand at [employer]  15.7 cm    At [place] of 2nd Digit  5.2 cm                        PT Education - 05/28/18 1206    Education Details  Educated pt and her sister on lymphedema risk and prevention. Issued After Breast Cancer Class handout.    Person(s) Educated  Patient;Caregiver(s)    Methods  Explanation;Handout    Comprehension  Verbalized understanding       PT Short Term Goals - 03/04/18 1429      PT SHORT TERM GOAL #1   Title  Patient will be independent in initial HEP and perform on a regular basis.     Time  2    Period  Weeks    Status  New    Target Date  03/18/18      PT SHORT TERM GOAL #2   Title  Patient will present with a more erect seated posture with minimal cuing during therapy session.     Baseline  initial - moderate fwd head, rounded shoulders, increased thoracic kyphosis, and decreased lumbar  lordosis    Time  2    Period  Weeks    Status  New    Target Date  03/18/18        PT Long Term Goals - 05/28/18 1210      PT LONG TERM GOAL #5   Title  Patient will demonstrate she has returned to baseline post operatively related to shoulder ROM and function.    Time  8    Period  Weeks    Status  Achieved            Plan - 05/28/18 1207    Clinical Impression Statement  Patient appears to be back to baseline since her bilateral mastectomy. It is difficult to assess shoulder ROM due to severe spasticity with her cerebral palsy but measurements appear to be back to baseline. There is no current sign of lymphedema but she is at very high risk for the right arm as 16 nodes were removed and radiation will include her axilla. She was educated on risk of  lymphedema and how to reduce risk and verbalized understanding. There is no other need for PT at this time. It appears she will be able ot get positioned properly for radiation as she can raise her arm overhead in supine without problem.    PT Treatment/Interventions  ADLs/Self Care Home Management;Therapeutic exercise;Patient/family education    PT Next Visit Plan  D/C    PT Home Exercise Plan  Post op shoulder ROM HEP    Consulted and Agree with Plan of Care  Patient    Family Member Consulted  sister       Patient will benefit from skilled therapeutic intervention in order to improve the following deficits and impairments:  Decreased knowledge of precautions, Postural dysfunction, Decreased range of motion, Increased muscle spasms, Decreased mobility, Impaired UE functional use, Pain  Visit Diagnosis: Malignant neoplasm of upper-outer quadrant of right breast in female, estrogen receptor positive (HCC)  Abnormal posture  Aftercare following surgery for neoplasm     Problem List Patient Active Problem List   Diagnosis Date Noted  . Breast cancer, stage 2, right ([place]) 05/01/2018  . Malignant neoplasm of upper-outer  quadrant of right breast in female, estrogen receptor positive ([place]) 03/23/2018  . Pre-diabetes 12/24/2016  . Healthcare maintenance 12/26/2014  . Headache 12/26/2014  . Frequency of micturition 09/03/2013  . Need for Tdap vaccination 09/03/2013  . Pain in limb 04/12/2013  . Wart viral 04/12/2013  . HLD (hyperlipidemia) 09/16/2012  . Vitamin D deficiency 09/16/2012  . Hyperglycemia 09/16/2012  . HTN (hypertension) 09/16/2012  . 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 DENTAL PAIN 07/11/2009  . Infantile cerebral palsy ([place]) 11/23/2008  . GERD 11/23/2008  . Constipation 11/23/2008  . Dysphagia 11/23/2008   PHYSICAL THERAPY DISCHARGE SUMMARY  Visits from Start of Care: 2  Current functional level related to goals / functional outcomes: PT goals related to breast cancer have been met.   Remaining deficits: Patient is back to baseline related to breast cancer deficits.   Education / Equipment: HEP and lymphedema education.  Plan: Patient agrees to discharge.  Patient goals were met. Patient is being discharged due to meeting the stated rehab goals.  ?????         Annia Friendly, [place] 05/28/18 12:12 PM  [place], [place], [address] Phone: [phone]   Fax:  [phone]  Name: JULICIA KRIEGER MRN: [id number] Date of Birth: 1966/04/01

## 2018-05-29 ENCOUNTER — Telehealth: Payer: Self-pay | Admitting: *Deleted

## 2018-05-29 NOTE — Telephone Encounter (Signed)
Medical records faxed to [facility]; [place] [phone]

## 2018-06-03 ENCOUNTER — Encounter (HOSPITAL_COMMUNITY)
Admission: RE | Admit: 2018-06-03 | Discharge: 2018-06-03 | Disposition: A | Discharge status: Home / Self Care | Payer: Medicare Other | Source: Ambulatory Visit | Attending: Hematology and Oncology | Admitting: Hematology and Oncology

## 2018-06-03 ENCOUNTER — Ambulatory Visit (HOSPITAL_COMMUNITY)
Admission: RE | Admit: 2018-06-03 | Discharge: 2018-06-03 | Disposition: A | Discharge status: Home / Self Care | Payer: Medicare Other | Source: Ambulatory Visit | Attending: Hematology and Oncology | Admitting: Hematology and Oncology

## 2018-06-03 DIAGNOSIS — Z170 Estrogen receptor positive status [ER+]: Secondary | ICD-10-CM | POA: Diagnosis not present

## 2018-06-03 DIAGNOSIS — C50411 Malignant neoplasm of upper-outer quadrant of right female breast: Secondary | ICD-10-CM | POA: Diagnosis not present

## 2018-06-03 DIAGNOSIS — C7951 Secondary malignant neoplasm of bone: Secondary | ICD-10-CM | POA: Diagnosis not present

## 2018-06-03 DIAGNOSIS — C50911 Malignant neoplasm of unspecified site of right female breast: Secondary | ICD-10-CM | POA: Diagnosis not present

## 2018-06-03 DIAGNOSIS — C50912 Malignant neoplasm of unspecified site of left female breast: Secondary | ICD-10-CM | POA: Diagnosis not present

## 2018-06-03 MED ORDER — IOHEXOL 300 MG/ML  SOLN
100.0000 mL | Freq: Once | INTRAMUSCULAR | Status: AC | PRN
  Administered 2018-06-03: 80 mL via INTRAVENOUS

## 2018-06-03 MED ORDER — HEPARIN SOD (PORK) LOCK FLUSH 100 UNIT/ML IV SOLN
500.0000 [IU] | Freq: Once | INTRAVENOUS | Status: AC
  Administered 2018-06-03: 500 [IU] via INTRAVENOUS

## 2018-06-03 MED ORDER — SODIUM CHLORIDE (PF) 0.9 % IJ SOLN
INTRAMUSCULAR | Status: AC
  Filled 2018-06-03: qty 50

## 2018-06-03 MED ORDER — TECHNETIUM TC 99M MEDRONATE IV KIT
20.0000 | PACK | Freq: Once | INTRAVENOUS | Status: AC | PRN
  Administered 2018-06-03: 18.1 via INTRAVENOUS

## 2018-06-03 MED ORDER — HEPARIN SOD (PORK) LOCK FLUSH 100 UNIT/ML IV SOLN
INTRAVENOUS | Status: AC
  Administered 2018-06-03: 500 [IU] via INTRAVENOUS
  Filled 2018-06-03: qty 5

## 2018-06-04 ENCOUNTER — Inpatient Hospital Stay: Payer: Medicare Other | Attending: Hematology and Oncology | Admitting: Hematology and Oncology

## 2018-06-04 ENCOUNTER — Telehealth: Payer: Self-pay

## 2018-06-04 VITALS — BP 144/85 | HR 82 | Temp 98.4°F | Resp 17 | Ht 62.0 in | Wt 123.3 lb

## 2018-06-04 DIAGNOSIS — Z9013 Acquired absence of bilateral breasts and nipples: Secondary | ICD-10-CM | POA: Diagnosis not present

## 2018-06-04 DIAGNOSIS — Z5111 Encounter for antineoplastic chemotherapy: Secondary | ICD-10-CM | POA: Diagnosis not present

## 2018-06-04 DIAGNOSIS — Z5112 Encounter for antineoplastic immunotherapy: Secondary | ICD-10-CM | POA: Diagnosis not present

## 2018-06-04 DIAGNOSIS — G809 Cerebral palsy, unspecified: Secondary | ICD-10-CM

## 2018-06-04 DIAGNOSIS — C7951 Secondary malignant neoplasm of bone: Secondary | ICD-10-CM | POA: Insufficient documentation

## 2018-06-04 DIAGNOSIS — C50411 Malignant neoplasm of upper-outer quadrant of right female breast: Secondary | ICD-10-CM | POA: Diagnosis not present

## 2018-06-04 DIAGNOSIS — Z170 Estrogen receptor positive status [ER+]: Secondary | ICD-10-CM

## 2018-06-04 DIAGNOSIS — Z79899 Other long term (current) drug therapy: Secondary | ICD-10-CM | POA: Diagnosis not present

## 2018-06-04 DIAGNOSIS — R079 Chest pain, unspecified: Secondary | ICD-10-CM

## 2018-06-04 NOTE — Telephone Encounter (Signed)
Spoke with pt guardian, Evett, regarding making an appt to see Dr.Gudena today and discuss bone scan results. Pt agreeable to make an appt this afternoon at 1pm. Sent msg to scheduling to make appt.

## 2018-06-04 NOTE — Progress Notes (Signed)
DISCONTINUE ON PATHWAY REGIMEN - Breast     A cycle is every 21 days:     Docetaxel      Carboplatin      Trastuzumab-xxxx      Trastuzumab-xxxx      Pertuzumab      Pertuzumab   **Always confirm dose/schedule in your pharmacy ordering system**  REASON: Other Reason PRIOR TREATMENT: [id number]: Docetaxel + Carboplatin + Trastuzumab + Pertuzumab (TCHP) q21 Days x 6 Cycles, Followed by Trastuzumab + Pertuzumab q21 Days x 11 Cycles TREATMENT RESPONSE: Unable to Evaluate  START ON PATHWAY REGIMEN - Breast     A cycle is every 21 days:     Pertuzumab      Pertuzumab      Trastuzumab-xxxx      Trastuzumab-xxxx      Paclitaxel   **Always confirm dose/schedule in your pharmacy ordering system**  Patient Characteristics: Distant Metastases or Locoregional Recurrent Disease - Unresected or Locally Advanced Unresectable Disease Progressing after Neoadjuvant and Local Therapies, HER2 Positive, ER Positive, Chemotherapy + HER2-Targeted Therapy, First Line Therapeutic Status: Distant Metastases BRCA Mutation Status: Absent ER Status: Positive (+) HER2 Status: Positive (+) PR Status: Negative (-) Line of Therapy: First Line Intent of Therapy: Non-Curative / Palliative Intent, Discussed with Patient

## 2018-06-04 NOTE — Progress Notes (Signed)
ON PATHWAY REGIMEN - Breast  No Change  Continue With Treatment as Ordered.     A cycle is every 21 days:     Docetaxel      Carboplatin      Trastuzumab-xxxx      Trastuzumab-xxxx      Pertuzumab      Pertuzumab   **Always confirm dose/schedule in your pharmacy ordering system**  Patient Characteristics: Postoperative without Neoadjuvant Therapy (Pathologic Staging), Invasive Disease, Adjuvant Therapy, HER2 Positive, ER Positive, Node Positive Therapeutic Status: Postoperative without Neoadjuvant Therapy (Pathologic Staging) AJCC Grade: G3 AJCC N Category: pN3 AJCC M Category: cM0 ER Status: Positive (+) AJCC 8 Stage Grouping: IIIB HER2 Status: Positive (+) Oncotype Dx Recurrence Score: Not Appropriate AJCC T Category: pT2 PR Status: Negative (-) Intent of Therapy: Curative Intent, Discussed with Patient

## 2018-06-04 NOTE — Progress Notes (Signed)
Patient Care Team: Janora Norlander, DO as PCP - General (Family Medicine) Danie Binder, MD (Gastroenterology) Alphonsa Overall, MD as Consulting Physician (General Surgery) Nicholas Lose, MD as Consulting Physician (Hematology and Oncology) Kyung Rudd, MD as Consulting Physician (Radiation Oncology)  DIAGNOSIS:    ICD-10-CM   1. Bone metastases (HCC) C79.51   2. Malignant neoplasm of upper-outer quadrant of right breast in female, estrogen receptor positive ([place]) C50.411    Z17.0     SUMMARY OF ONCOLOGIC HISTORY:   Malignant neoplasm of upper-outer quadrant of right breast in female, estrogen receptor positive ([place])   03/18/2018 Initial Diagnosis    Screening detected bilateral breast abnormalities.  Right breast mass UOQ 2.1 cm at 10 o'clock position: Grade 1 ILC ER 70%, PR 0%, HER-2 +3+ by IHC, Ki-67 5%, suspected lymph node in the axilla could not be biopsied; left breast calcifications by ultrasound not visible but 5 cm cystic/tubular structure which on biopsy was fibrocystic change with ADHl; T2N0 stage IIa clinical stage    05/01/2018 Surgery    Bilateral mastectomies: Left mastectomy: DCIS intermediate grade 0.9 cm margins negative Tis NX stage 0; right mastectomy: ILC, grade 3, 3.5 cm, with LCIS, perineural invasion present, margins negative, 15/22 lymph nodes positive with extracapsular extension, ER 90%, PR 0%, HER-2 +(IHC 3+), Ki-67 5%, T2N3 Stage 3B     05/11/2018 Cancer Staging    Staging form: Breast, AJCC 8th Edition - Pathologic stage from 05/11/2018: Stage IIIB (pT2, pN3, cM0, G3, ER+, PR-, HER2+) - Signed by Nicholas Lose, MD on 05/11/2018    06/03/2018 Imaging    Bone scan: Increased tracer uptake in the lumbar spine at L1, L3 and L4, right fourth and seventh ribs, inferior right scapula and distal sternum, T7 and T11 as well.   CT CAP showed widespread bone metastases with possible pathologic fracture at L3, peripheral right upper lobe nodules nonspecific      CHIEF COMPLIANT: Follow-up to review recent scans  INTERVAL HISTORY: [name] is a 53 y.o. with above-mentioned history of right breast cancer underwent bilateral mastectomies. A recent bone scan and CT CAP from 06/03/18 showed widespread osseous metastatic disease. She presents to the clinic today with her sisters, one who is her legal guardian, power of attorney, and primary historian due to her cerebral palsy. Her mother notes she is very interested in participating in a clinical trial. She denies numbness or tingling in her hands or feet. She reports mild pain in her chest at the surgical site.   REVIEW OF SYSTEMS:   Constitutional: Denies fevers, chills or abnormal weight loss Eyes: Denies blurriness of vision Ears, nose, mouth, throat, and face: Denies mucositis or sore throat Respiratory: Denies cough, dyspnea or wheezes Cardiovascular: Denies palpitation, chest discomfort Gastrointestinal: Denies nausea, heartburn or change in bowel habits Skin: Denies abnormal skin rashes Lymphatics: Denies new lymphadenopathy or easy bruising Neurological: Denies numbness, tingling or new weaknesses Behavioral/Psych: Mood is stable, no new changes  Extremities: No lower extremity edema Breast: denies lumps or nodules in either breasts (+) pain at surgical site All other systems were reviewed with the patient and are negative.  I have reviewed the past medical history, past surgical history, social history and family history with the patient and they are unchanged from previous note.  ALLERGIES:  is allergic to hydrocodone.  MEDICATIONS:  Current Outpatient Medications  Medication Sig Dispense Refill  . dexamethasone (DECADRON) 4 MG tablet Take 1 tablet (4 mg total) by mouth daily.  Take 1 tablet day before chemo and 1 tablet day after chemo 12 tablet 0  . ibuprofen (ADVIL,MOTRIN) 600 MG tablet Take 1 tablet (600 mg total) by mouth every 6 (six) hours as needed (for mild pain not relieved  by other medications.). 30 tablet 0  . lidocaine-prilocaine (EMLA) cream Apply to affected area once 30 g 3  . lisinopril (PRINIVIL,ZESTRIL) 40 MG tablet Take 1 tablet (40 mg total) by mouth daily. 90 tablet 1  . megestrol (MEGACE) 40 MG tablet Take 3 tablets every day (Patient taking differently: Take 60 mg by mouth 2 (two) times daily with a meal. Take 3 tablets every day) 270 tablet 11  . Multiple Vitamin (MULTIVITAMIN) tablet Take 1 tablet by mouth daily.    . ondansetron (ZOFRAN) 8 MG tablet Take 1 tablet (8 mg total) by mouth 2 (two) times daily as needed (Nausea or vomiting). Begin 4 days after chemotherapy. 30 tablet 1  . ondansetron (ZOFRAN-ODT) 4 MG disintegrating tablet Take 1 tablet (4 mg total) by mouth every 6 (six) hours as needed for nausea. 15 tablet 0  . oxyCODONE (OXY IR/ROXICODONE) 5 MG immediate release tablet Take 1 tablet (5 mg total) by mouth every 6 (six) hours as needed for severe pain. 25 tablet 0  . pantoprazole (PROTONIX) 40 MG tablet TAKE 1 TABLET BY MOUTH 30 MINUTES PRIOR TO BREAKFAST AND SUPPER (Patient taking differently: Take 40 mg by mouth 2 (two) times daily before a meal. TAKE 1 TABLET BY MOUTH 30 MINUTES PRIOR TO BREAKFAST AND SUPPER) 180 tablet 1  . polyethylene glycol (MIRALAX / GLYCOLAX) packet Take 17 g by mouth daily as needed for moderate constipation.    . pravastatin (PRAVACHOL) 40 MG tablet Take 1 tablet (40 mg total) by mouth daily. (Patient taking differently: Take 40 mg by mouth every evening. ) 90 tablet 3  . prochlorperazine (COMPAZINE) 10 MG tablet Take 1 tablet (10 mg total) by mouth every 6 (six) hours as needed (Nausea or vomiting). 30 tablet 1   No current facility-administered medications for this visit.     PHYSICAL EXAMINATION: ECOG PERFORMANCE STATUS: 1 - Symptomatic but completely ambulatory  Vitals:   06/04/18 1313  BP: (!) 144/85  Pulse: 82  Resp: 17  Temp: 98.4 F (36.9 C)  SpO2: 100%   Filed Weights   06/04/18 1313   Weight: 123 lb 4.8 oz (55.9 kg)    GENERAL: alert, no distress and comfortable SKIN: skin color, texture, turgor are normal, no rashes or significant lesions EYES: normal, Conjunctiva are pink and non-injected, sclera clear OROPHARYNX: no exudate, no erythema and lips, buccal mucosa, and tongue normal  NECK: supple, thyroid normal size, non-tender, without nodularity LYMPH: no palpable lymphadenopathy in the cervical, axillary or inguinal LUNGS: clear to auscultation and percussion with normal breathing effort HEART: regular rate & rhythm and no murmurs and no lower extremity edema ABDOMEN: abdomen soft, non-tender and normal bowel sounds MUSCULOSKELETAL: no cyanosis of digits and no clubbing  NEURO: alert & oriented x 3 with fluent speech, no focal motor/sensory deficits EXTREMITIES: No lower extremity edema  LABORATORY DATA:  I have reviewed the data as listed CMP Latest Ref Rng & Units 04/21/2018 03/25/2018 12/19/2017  Glucose 70 - 99 mg/dL 104(H) 92 96  BUN 6 - 20 mg/dL 11 13 15  Creatinine 0.44 - 1.00 mg/dL 0.54 0.67 0.58  Sodium 135 - 145 mmol/L 140 144 146(H)  Potassium 3.5 - 5.1 mmol/L 3.4(L) 3.7 3.2(L)  Chloride 98 - 111   mmol/L 107 110 109  CO2 22 - 32 mmol/L _0 Calcium 8.9 - 10.3 mg/dL 9.4 9.4 9.7  Total Protein 6.5 - 8.1 g/dL - 7.5 -  Total Bilirubin 0.3 - 1.2 mg/dL - 0.3 -  Alkaline Phos 38 - 126 U/L - 193(H) -  AST 15 - 41 U/L - 15 -  ALT 0 - 44 U/L - 21 -    Lab Results  Component Value Date   WBC 7.8 04/21/2018   HGB 12.2 04/21/2018   HCT 37.8 04/21/2018   MCV 93.3 04/21/2018   PLT 351 04/21/2018   NEUTROABS 4.1 03/25/2018    ASSESSMENT & PLAN:  Malignant neoplasm of upper-outer quadrant of right breast in female, estrogen receptor positive ([place]) 05/01/18: Bilateral mastectomies: Left mastectomy: DCIS intermediate grade 0.9 cm margins negative Tis NX stage 0; right mastectomy: ILC, grade 3, 3.5 cm, with LCIS, perineural invasion present, margins  negative, 15/22 lymph nodes positive with extracapsular extension, ER 90%, PR 0%, HER-2 +(IHC 3+), Ki-67 5%, T2N3 Stage 3B  06/03/2018 bone scan: Increased tracer uptake in the lumbar spine at L1, L3 and L4, right fourth and seventh ribs, inferior right scapula and distal sternum, T7 and T11 as well.   CT CAP showed widespread bone metastases with possible pathologic fracture at L3, peripheral right upper lobe nodules nonspecific  Counseling: Given the evidence of metastatic disease, I did not recommend adjuvant TCH [place] chemotherapy. I counseled her about participating in clinical trial [id number]  NRG [id number]: Phase 3 randomized double blind trial of Taxol/trastuzumab/Pertuzumab with Atezolizumab or placebo in first line HER-2 positive metastatic breast cancer.  Taxol is given weekly while trastuzumab Pertuzumab and Atezolizumab are given every 3 weeks.  I discussed the risks and benefits of chemotherapy including Taxol related peripheral neuropathy and trastuzumab and Pertuzumab related cardiac muscle dysfunction which is reversible as well as Pertuzumab related diarrhea.  We also discussed the immunotherapy related adverse effects.  Patient has cerebral palsy and her sister is the power of attorney for healthcare.  We will check with a clinical trial if she is allowed to participate.   No orders of the defined types were placed in this encounter.  The patient has a good understanding of the overall plan. she agrees with it. she will call with any problems that may develop before the next visit here.  Nicholas Lose, MD 06/04/2018  Julious Oka Dorshimer am acting as scribe for Dr. Nicholas Lose.  I have reviewed the above documentation for accuracy and completeness, and I agree with the above.

## 2018-06-04 NOTE — Assessment & Plan Note (Signed)
05/01/18: Bilateral mastectomies: Left mastectomy: DCIS intermediate grade 0.9 cm margins negative Tis NX stage 0; right mastectomy: ILC, grade 3, 3.5 cm, with LCIS, perineural invasion present, margins negative, 15/22 lymph nodes positive with extracapsular extension, ER 90%, PR 0%, HER-2 +(IHC 3+), Ki-67 5%, T2N3 Stage 3B  06/03/2018 bone scan: Increased tracer uptake in the lumbar spine at L1, L3 and L4, right fourth and seventh ribs, inferior right scapula and distal sternum, T7 and T11 as well.   CT CAP showed widespread bone metastases with possible pathologic fracture at L3, peripheral right upper lobe nodules nonspecific  Counseling: Given the evidence of metastatic disease, I did not recommend adjuvant TCH Perjeta chemotherapy. I counseled her about participating in clinical trial [id number]  NRG [id number]: Phase 3 randomized double blind trial of Taxol/trastuzumab/Pertuzumab with Atezolizumab or placebo in first line HER-2 positive metastatic breast cancer.  Taxol is given weekly while trastuzumab Pertuzumab and Atezolizumab are given every 3 weeks.  I discussed the risks and benefits of chemotherapy including Taxol related peripheral neuropathy and trastuzumab and Pertuzumab related cardiac muscle dysfunction which is reversible as well as Pertuzumab related diarrhea.  We also discussed the immunotherapy related adverse effects.  Patient has cerebral palsy and her mother is the power of attorney for healthcare.  We will check with a clinical trial if she is allowed to participate.

## 2018-06-08 ENCOUNTER — Telehealth: Payer: Self-pay | Admitting: *Deleted

## 2018-06-08 NOTE — Telephone Encounter (Signed)
06/08/18 11:40 AM  This RN called and spoke with Ms. Virgia Land POA, sister Betty Daidone.  Explained that [name] is not eligible for the [place] BR004 study, and assured her that the patient will continue her care with Dr. Lindi Adie as planned.  Evette denies any questions or concerns.   Doreatha Martin, RN, BSN, [facility] 06/08/2018 11:41 AM

## 2018-06-09 NOTE — Progress Notes (Signed)
Patient Care Team: Janora Norlander, DO as PCP - General (Family Medicine) Danie Binder, MD (Gastroenterology) Alphonsa Overall, MD as Consulting Physician (General Surgery) Nicholas Lose, MD as Consulting Physician (Hematology and Oncology) Kyung Rudd, MD as Consulting Physician (Radiation Oncology)  DIAGNOSIS:    ICD-10-CM   1. Malignant neoplasm of upper-outer quadrant of right breast in female, estrogen receptor positive ([place]) C50.411    Z17.0     SUMMARY OF ONCOLOGIC HISTORY:   Malignant neoplasm of upper-outer quadrant of right breast in female, estrogen receptor positive ([place])   03/18/2018 Initial Diagnosis    Screening detected bilateral breast abnormalities.  Right breast mass UOQ 2.1 cm at 10 o'clock position: Grade 1 ILC ER 70%, PR 0%, HER-2 +3+ by IHC, Ki-67 5%, suspected lymph node in the axilla could not be biopsied; left breast calcifications by ultrasound not visible but 5 cm cystic/tubular structure which on biopsy was fibrocystic change with ADHl; T2N0 stage IIa clinical stage    05/01/2018 Surgery    Bilateral mastectomies: Left mastectomy: DCIS intermediate grade 0.9 cm margins negative Tis NX stage 0; right mastectomy: ILC, grade 3, 3.5 cm, with LCIS, perineural invasion present, margins negative, 15/22 lymph nodes positive with extracapsular extension, ER 90%, PR 0%, HER-2 +(IHC 3+), Ki-67 5%, T2N3 Stage 3B     05/11/2018 Cancer Staging    Staging form: Breast, AJCC 8th Edition - Pathologic stage from 05/11/2018: Stage IIIB (pT2, pN3, cM0, G3, ER+, PR-, HER2+) - Signed by Nicholas Lose, MD on 05/11/2018    06/03/2018 Imaging    Bone scan: Increased tracer uptake in the lumbar spine at L1, L3 and L4, right fourth and seventh ribs, inferior right scapula and distal sternum, T7 and T11 as well.   CT CAP showed widespread bone metastases with possible pathologic fracture at L3, peripheral right upper lobe nodules nonspecific    06/10/2018 -  Chemotherapy   Taxol Kingenti and Perjeta      CHIEF COMPLIANT: Cycle 1 Day 1 Taxol, Kingenti, and Perjeta  INTERVAL HISTORY: [name] is a 53 y.o. with above-mentioned history of metastatic breast cancer. She was very interested in participating in a clinical trial but was deemed not eligible. She presents to the clinic today with her sisters, who is her legal guardian due to her cerebral palsy, to begin chemotherapy. Her sister is disappointed that the patient could not be involved in the clinical trial. The patient and her sister understand how to use the nausea and diarrhea medication. 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   REVIEW OF SYSTEMS:   Constitutional: Denies fevers, chills or abnormal weight loss Eyes: Denies blurriness of vision Ears, nose, mouth, throat, and face: Denies mucositis or sore throat Respiratory: Denies cough, dyspnea or wheezes Cardiovascular: Denies palpitation, chest discomfort Gastrointestinal: Denies nausea, heartburn or change in bowel habits Skin: Denies abnormal skin rashes Lymphatics: Denies new lymphadenopathy or easy bruising Neurological: Denies numbness, tingling or new weaknesses Behavioral/Psych: Mood is stable, no new changes  Extremities: No lower extremity edema Breast: denies any pain or lumps or nodules in either breasts All other systems were reviewed with the patient and are negative.  I have reviewed the past medical history, past surgical history, social history and family history with the patient and they are unchanged from previous note.  ALLERGIES:  is allergic to hydrocodone.  MEDICATIONS:  Current Outpatient Medications  Medication Sig Dispense Refill  . ibuprofen (ADVIL,MOTRIN) 600 MG tablet Take 1  tablet (600 mg total) by mouth every 6 (six) hours as needed (for mild pain not relieved by other medications.). 30 tablet 0  . lisinopril (PRINIVIL,ZESTRIL) 40 MG tablet Take 1 tablet (40 mg total) by mouth daily.  90 tablet 1  . megestrol (MEGACE) 40 MG tablet Take 3 tablets every day (Patient taking differently: Take 60 mg by mouth 2 (two) times daily with a meal. Take 3 tablets every day) 270 tablet 11  . Multiple Vitamin (MULTIVITAMIN) tablet Take 1 tablet by mouth daily.    . ondansetron (ZOFRAN-ODT) 4 MG disintegrating tablet Take 1 tablet (4 mg total) by mouth every 6 (six) hours as needed for nausea. 15 tablet 0  . oxyCODONE (OXY IR/ROXICODONE) 5 MG immediate release tablet Take 1 tablet (5 mg total) by mouth every 6 (six) hours as needed for severe pain. 25 tablet 0  . pantoprazole (PROTONIX) 40 MG tablet TAKE 1 TABLET BY MOUTH 30 MINUTES PRIOR TO BREAKFAST AND SUPPER (Patient taking differently: Take 40 mg by mouth 2 (two) times daily before a meal. TAKE 1 TABLET BY MOUTH 30 MINUTES PRIOR TO BREAKFAST AND SUPPER) 180 tablet 1  . polyethylene glycol (MIRALAX / GLYCOLAX) packet Take 17 g by mouth daily as needed for moderate constipation.    . pravastatin (PRAVACHOL) 40 MG tablet Take 1 tablet (40 mg total) by mouth daily. (Patient taking differently: Take 40 mg by mouth every evening. ) 90 tablet 3   No current facility-administered medications for this visit.     PHYSICAL EXAMINATION: ECOG PERFORMANCE STATUS: 2 - Symptomatic, <50% confined to bed  Vitals:   06/10/18 0830  BP: (!) 138/92  Pulse: 66  Resp: 18  Temp: 97.7 F (36.5 C)  SpO2: 100%   Filed Weights   06/10/18 0830  Weight: 120 lb 4.8 oz (54.6 kg)    GENERAL: alert, no distress and comfortable SKIN: skin color, texture, turgor are normal, no rashes or significant lesions EYES: normal, Conjunctiva are pink and non-injected, sclera clear OROPHARYNX: no exudate, no erythema and lips, buccal mucosa, and tongue normal  NECK: supple, thyroid normal size, non-tender, without nodularity LYMPH: no palpable lymphadenopathy in the cervical, axillary or inguinal LUNGS: clear to auscultation and percussion with normal breathing  effort HEART: regular rate & rhythm and no murmurs and no lower extremity edema ABDOMEN: abdomen soft, non-tender and normal bowel sounds MUSCULOSKELETAL: Finger contractures NEURO: Cerebral palsy EXTREMITIES: No lower extremity edema  LABORATORY DATA:  I have reviewed the data as listed CMP Latest Ref Rng & Units 04/21/2018 03/25/2018 12/19/2017  Glucose 70 - 99 mg/dL 104(H) 92 96  BUN 6 - 20 mg/dL _0 Creatinine 0.44 - 1.00 mg/dL 0.54 0.67 0.58  Sodium 135 - 145 mmol/L 140 144 146(H)  Potassium 3.5 - 5.1 mmol/L 3.4(L) 3.7 3.2(L)  Chloride 98 - 111 mmol/L 107 110 109  CO2 22 - 32 mmol/L _1 Calcium 8.9 - 10.3 mg/dL 9.4 9.4 9.7  Total Protein 6.5 - 8.1 g/dL - 7.5 -  Total Bilirubin 0.3 - 1.2 mg/dL - 0.3 -  Alkaline Phos 38 - 126 U/L - 193(H) -  AST 15 - 41 U/L - 15 -  ALT 0 - 44 U/L - 21 -    Lab Results  Component Value Date   WBC 6.9 06/10/2018   HGB 11.6 (L) 06/10/2018   HCT 35.9 (L) 06/10/2018   MCV 92.8 06/10/2018   PLT 321 06/10/2018   NEUTROABS 4.1 06/10/2018  ASSESSMENT & PLAN:  Malignant neoplasm of upper-outer quadrant of right breast in female, estrogen receptor positive ([place]) 05/01/18:Bilateral mastectomies: Left mastectomy: DCIS intermediate grade 0.9 cm margins negative Tis NX stage 0; right mastectomy: ILC, grade 3, 3.5 cm, with LCIS, perineural invasion present, margins negative, 15/22 lymph nodes positive with extracapsular extension, ER 90%, PR 0%, HER-2 +(IHC 3+), Ki-67 5%, T2N3 Stage 3B  06/03/2018 bone scan: Increased tracer uptake in the lumbar spine at L1, L3 and L4, right fourth and seventh ribs, inferior right scapula and distal sternum, T7 and T11 as well.   CT CAP showed widespread bone metastases with possible pathologic fracture at L3, peripheral right upper lobe nodules nonspecific Patient has cerebral palsy and her sister is the power of attorney for healthcare Treatment plan: Palliative treatment with Taxol, Kingenti,  Perjeta ------------------------------------------------------------------------------------------------------------------------ Current treatment: Cycle 1 day 1 Taxol Kingenti and Perjeta Labs have been reviewed Antiemetics were reviewed Sister provided the chemo consent. Return to clinic in 1 week for toxicity check  No orders of the defined types were placed in this encounter.  The patient has a good understanding of the overall plan. she agrees with it. she will call with any problems that may develop before the next visit here.  Nicholas Lose, MD 06/10/2018  Julious Oka Dorshimer am acting as scribe for Dr. Nicholas Lose.  I have reviewed the above documentation for accuracy and completeness, and I agree with the above.

## 2018-06-10 ENCOUNTER — Inpatient Hospital Stay: Payer: Medicare Other

## 2018-06-10 ENCOUNTER — Telehealth: Payer: Self-pay | Admitting: *Deleted

## 2018-06-10 ENCOUNTER — Inpatient Hospital Stay (HOSPITAL_BASED_OUTPATIENT_CLINIC_OR_DEPARTMENT_OTHER): Payer: Medicare Other | Admitting: Hematology and Oncology

## 2018-06-10 ENCOUNTER — Encounter: Payer: Self-pay | Admitting: *Deleted

## 2018-06-10 ENCOUNTER — Telehealth: Payer: Self-pay

## 2018-06-10 VITALS — BP 155/93 | HR 91 | Temp 98.6°F | Resp 18

## 2018-06-10 DIAGNOSIS — C7951 Secondary malignant neoplasm of bone: Secondary | ICD-10-CM | POA: Diagnosis not present

## 2018-06-10 DIAGNOSIS — Z79899 Other long term (current) drug therapy: Secondary | ICD-10-CM

## 2018-06-10 DIAGNOSIS — Z5111 Encounter for antineoplastic chemotherapy: Secondary | ICD-10-CM

## 2018-06-10 DIAGNOSIS — C50411 Malignant neoplasm of upper-outer quadrant of right female breast: Secondary | ICD-10-CM

## 2018-06-10 DIAGNOSIS — Z5112 Encounter for antineoplastic immunotherapy: Secondary | ICD-10-CM | POA: Diagnosis not present

## 2018-06-10 DIAGNOSIS — Z170 Estrogen receptor positive status [ER+]: Principal | ICD-10-CM

## 2018-06-10 DIAGNOSIS — R110 Nausea: Secondary | ICD-10-CM

## 2018-06-10 DIAGNOSIS — G809 Cerebral palsy, unspecified: Secondary | ICD-10-CM

## 2018-06-10 DIAGNOSIS — Z9013 Acquired absence of bilateral breasts and nipples: Secondary | ICD-10-CM

## 2018-06-10 DIAGNOSIS — R079 Chest pain, unspecified: Secondary | ICD-10-CM

## 2018-06-10 LAB — CMP (CANCER CENTER ONLY)
ALT: 17 U/L (ref 0–44)
ANION GAP: 10 (ref 5–15)
AST: 13 U/L — ABNORMAL LOW (ref 15–41)
Albumin: 3.6 g/dL (ref 3.5–5.0)
Alkaline Phosphatase: 108 U/L (ref 38–126)
BILIRUBIN TOTAL: 0.3 mg/dL (ref 0.3–1.2)
BUN: 12 mg/dL (ref 6–20)
CO2: 25 mmol/L (ref 22–32)
Calcium: 9.3 mg/dL (ref 8.9–10.3)
Chloride: 108 mmol/L (ref 98–111)
Creatinine: 0.71 mg/dL (ref 0.44–1.00)
GFR, Est AFR Am: >60 mL/min (ref >60)
GFR, Estimated: >60 mL/min (ref >60)
Glucose, Bld: 94 mg/dL (ref 70–99)
POTASSIUM: 3.6 mmol/L (ref 3.5–5.1)
Sodium: 143 mmol/L (ref 135–145)
Total Protein: 7.5 g/dL (ref 6.5–8.1)

## 2018-06-10 LAB — CBC WITH DIFFERENTIAL (CANCER CENTER ONLY)
Abs Immature Granulocytes: 0.02 10*3/uL (ref 0.00–0.07)
BASOS PCT: 1 %
Basophils Absolute: 0 10*3/uL (ref 0.0–0.1)
Eosinophils Absolute: 0.1 10*3/uL (ref 0.0–0.5)
Eosinophils Relative: 2 %
HCT: 35.9 % — ABNORMAL LOW (ref 36.0–46.0)
Hemoglobin: 11.6 g/dL — ABNORMAL LOW (ref 12.0–15.0)
Immature Granulocytes: 0 %
Lymphocytes Relative: 31 %
Lymphs Abs: 2.2 10*3/uL (ref 0.7–4.0)
MCH: 30 pg (ref 26.0–34.0)
MCHC: 32.3 g/dL (ref 30.0–36.0)
MCV: 92.8 fL (ref 80.0–100.0)
Monocytes Absolute: 0.5 10*3/uL (ref 0.1–1.0)
Monocytes Relative: 7 %
Neutro Abs: 4.1 10*3/uL (ref 1.7–7.7)
Neutrophils Relative %: 59 %
PLATELETS: 321 10*3/uL (ref 150–400)
RBC: 3.87 MIL/uL (ref 3.87–5.11)
RDW: 13.4 % (ref 11.5–15.5)
WBC: 6.9 10*3/uL (ref 4.0–10.5)
nRBC: 0 % (ref 0.0–0.2)

## 2018-06-10 MED ORDER — FAMOTIDINE IN NACL 20-0.9 MG/50ML-% IV SOLN
INTRAVENOUS | Status: AC
  Filled 2018-06-10: qty 50

## 2018-06-10 MED ORDER — SODIUM CHLORIDE 0.9 % IV SOLN: 10.0000 mg | Freq: Once | INTRAVENOUS | Status: DC

## 2018-06-10 MED ORDER — SODIUM CHLORIDE 0.9 % IV SOLN: Freq: Once | INTRAVENOUS | Status: DC

## 2018-06-10 MED ORDER — SODIUM CHLORIDE 0.9 % IV SOLN
Freq: Once | INTRAVENOUS | Status: AC
  Administered 2018-06-10: 09:00:00 via INTRAVENOUS
  Filled 2018-06-10: qty 250

## 2018-06-10 MED ORDER — ACETAMINOPHEN 325 MG PO TABS
ORAL_TABLET | ORAL | Status: AC
  Filled 2018-06-10: qty 2

## 2018-06-10 MED ORDER — DEXAMETHASONE SODIUM PHOSPHATE 10 MG/ML IJ SOLN
INTRAMUSCULAR | Status: AC
  Filled 2018-06-10: qty 1

## 2018-06-10 MED ORDER — FAMOTIDINE IN NACL 20-0.9 MG/50ML-% IV SOLN
20.0000 mg | Freq: Once | INTRAVENOUS | Status: AC
  Administered 2018-06-10: 20 mg via INTRAVENOUS

## 2018-06-10 MED ORDER — SODIUM CHLORIDE 0.9 % IV SOLN
420.0000 mg | Freq: Once | INTRAVENOUS | Status: AC
  Administered 2018-06-10: 420 mg via INTRAVENOUS
  Filled 2018-06-10: qty 14

## 2018-06-10 MED ORDER — SODIUM CHLORIDE 0.9 % IV SOLN
INTRAVENOUS | Status: DC
  Administered 2018-06-10: 11:00:00 via INTRAVENOUS
  Filled 2018-06-10: qty 250

## 2018-06-10 MED ORDER — SODIUM CHLORIDE 0.9 % IV SOLN
60.0000 mg/m2 | Freq: Once | INTRAVENOUS | Status: AC
  Administered 2018-06-10: 96 mg via INTRAVENOUS
  Filled 2018-06-10: qty 16

## 2018-06-10 MED ORDER — HEPARIN SOD (PORK) LOCK FLUSH 100 UNIT/ML IV SOLN
500.0000 [IU] | Freq: Once | INTRAVENOUS | Status: AC | PRN
  Administered 2018-06-10: 500 [IU]
  Filled 2018-06-10: qty 5

## 2018-06-10 MED ORDER — TRASTUZUMAB-ANNS CHEMO 420 MG IV SOLR: 8.0000 mg/kg | Freq: Once | INTRAVENOUS | Status: DC

## 2018-06-10 MED ORDER — TRASTUZUMAB CHEMO 150 MG IV SOLR
450.0000 mg | Freq: Once | INTRAVENOUS | Status: AC
  Administered 2018-06-10: 450 mg via INTRAVENOUS
  Filled 2018-06-10: qty 21.43

## 2018-06-10 MED ORDER — DEXAMETHASONE SODIUM PHOSPHATE 10 MG/ML IJ SOLN
10.0000 mg | Freq: Once | INTRAMUSCULAR | Status: AC
  Administered 2018-06-10: 10 mg via INTRAVENOUS

## 2018-06-10 MED ORDER — ONDANSETRON HCL 4 MG/2ML IJ SOLN
INTRAMUSCULAR | Status: AC
  Filled 2018-06-10: qty 4

## 2018-06-10 MED ORDER — DIPHENHYDRAMINE HCL 50 MG/ML IJ SOLN
25.0000 mg | Freq: Once | INTRAMUSCULAR | Status: AC
  Administered 2018-06-10: 25 mg via INTRAVENOUS

## 2018-06-10 MED ORDER — ACETAMINOPHEN 325 MG PO TABS
650.0000 mg | ORAL_TABLET | Freq: Once | ORAL | Status: AC
  Administered 2018-06-10: 650 mg via ORAL

## 2018-06-10 MED ORDER — DIPHENHYDRAMINE HCL 50 MG/ML IJ SOLN
INTRAMUSCULAR | Status: AC
  Filled 2018-06-10: qty 1

## 2018-06-10 MED ORDER — ONDANSETRON HCL 4 MG/2ML IJ SOLN
8.0000 mg | Freq: Once | INTRAMUSCULAR | Status: AC
  Administered 2018-06-10: 8 mg via INTRAVENOUS

## 2018-06-10 MED ORDER — SODIUM CHLORIDE 0.9% FLUSH
10.0000 mL | INTRAVENOUS | Status: DC | PRN
  Administered 2018-06-10: 10 mL
  Filled 2018-06-10: qty 10

## 2018-06-10 NOTE — Progress Notes (Signed)
Spoke w/ Dee today. Patient's PA ended up being submitted for Herceptin (not Kanjinti), and is authorized as such. Orders have been updated back to Herceptin.  Demetrius Charity, PharmD, [facility] Pharmacist Pharmacy Phone: [phone] 06/10/2018

## 2018-06-10 NOTE — Assessment & Plan Note (Signed)
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   CT CAP showed widespread bone metastases with possible pathologic fracture at L3, peripheral right upper lobe nodules nonspecific Patient has cerebral palsy and her sister is the power of attorney for healthcare Treatment plan: Palliative treatment with Taxol, Kingenti, Perjeta ------------------------------------------------------------------------------------------------------------------------ Current treatment: Cycle 1 day 1 Taxol Kingenti and Perjeta Labs have been reviewed Antiemetics were reviewed Sister provided the chemo consent. Return to clinic in 1 week for toxicity check

## 2018-06-10 NOTE — Patient Instructions (Signed)
[facility] Discharge Instructions for Patients Receiving Chemotherapy  Today you received the following chemotherapy agents TAXOL,HERCEPTIN,PERJETA To help prevent nausea and vomiting after your treatment, we encourage you to take your nausea medicationas prescribed.  If you develop nausea and vomiting that is not controlled by your nausea medication, call the clinic.   BELOW ARE SYMPTOMS THAT SHOULD BE REPORTED IMMEDIATELY:  *FEVER GREATER THAN 100.5 F  *CHILLS WITH OR WITHOUT FEVER  NAUSEA AND VOMITING THAT IS NOT CONTROLLED WITH YOUR NAUSEA MEDICATION  *UNUSUAL SHORTNESS OF BREATH  *UNUSUAL BRUISING OR BLEEDING  TENDERNESS IN MOUTH AND THROAT WITH OR WITHOUT PRESENCE OF ULCERS  *URINARY PROBLEMS  *BOWEL PROBLEMS  UNUSUAL RASH Items with * indicate a potential emergency and should be followed up as soon as possible.  Feel free to call the clinic should you have any questions or concerns. The clinic phone number is [phone].  Please show the [place] at check-in to the Emergency Department and triage nurse.

## 2018-06-10 NOTE — Telephone Encounter (Signed)
error 

## 2018-06-10 NOTE — Progress Notes (Signed)
Nauseated without vomiting. Per [name] zofran given.  1554-nausea resolved.

## 2018-06-10 NOTE — Telephone Encounter (Signed)
-----   Message from Ardeen Garland, RN sent at 06/10/2018 10:18 AM EST ----- Regarding: [place] -new herceptin.perjeta , taxol Contact sister York Grice [phone]. Pt has CP and should be home.

## 2018-06-10 NOTE — Telephone Encounter (Signed)
Call made in error

## 2018-06-12 ENCOUNTER — Inpatient Hospital Stay: Payer: Medicare Other

## 2018-06-16 NOTE — Progress Notes (Signed)
Patient Care Team: Janora Norlander, DO as PCP - General (Family Medicine) Danie Binder, MD (Gastroenterology) Alphonsa Overall, MD as Consulting Physician (General Surgery) Nicholas Lose, MD as Consulting Physician (Hematology and Oncology) Kyung Rudd, MD as Consulting Physician (Radiation Oncology)  DIAGNOSIS:    ICD-10-CM   1. Bone metastases (H[facility]) C79.51   2. Malignant neoplasm of upper-outer quadrant of right breast in female, estrogen receptor positive ([place]) C50.411    Z17.0     SUMMARY OF ONCOLOGIC HISTORY:   Malignant neoplasm of upper-outer quadrant of right breast in female, estrogen receptor positive ([place])   03/18/2018 Initial Diagnosis    Screening detected bilateral breast abnormalities.  Right breast mass UOQ 2.1 cm at 10 o'clock position: Grade 1 ILC ER 70%, PR 0%, HER-2 +3+ by IHC, Ki-67 5%, suspected lymph node in the axilla could not be biopsied; left breast calcifications by ultrasound not visible but 5 cm cystic/tubular structure which on biopsy was fibrocystic change with ADHl; T2N0 stage IIa clinical stage    05/01/2018 Surgery    Bilateral mastectomies: Left mastectomy: DCIS intermediate grade 0.9 cm margins negative Tis NX stage 0; right mastectomy: ILC, grade 3, 3.5 cm, with LCIS, perineural invasion present, margins negative, 15/22 lymph nodes positive with extracapsular extension, ER 90%, PR 0%, HER-2 +(IHC 3+), Ki-67 5%, T2N3 Stage 3B     05/11/2018 Cancer Staging    Staging form: Breast, AJCC 8th Edition - Pathologic stage from 05/11/2018: Stage IIIB (pT2, pN3, cM0, G3, ER+, PR-, HER2+) - Signed by Nicholas Lose, MD on 05/11/2018    06/03/2018 Imaging    Bone scan: Increased tracer uptake in the lumbar spine at L1, L3 and L4, right fourth and seventh ribs, inferior right scapula and distal sternum, T7 and T11 as well.   CT CAP showed widespread bone metastases with possible pathologic fracture at L3, peripheral right upper lobe nodules nonspecific    06/10/2018 -  Chemotherapy    Taxol Kingenti and Perjeta      CHIEF COMPLIANT: Cycle 2 Taxol Kingenti and Perjeta  INTERVAL HISTORY: [name] is a 53 y.o. with above-mentioned history of metastatic breast cancer who is currently on chemotherapy treatment with Taxol Kingenti and Perjeta. She presents to the clinic today with her two sisters, one of whom is her legal guardian, for cycle 2 of treatment. She denies nausea, vomting, and reports one loose stool on Saturday for which Imodium was helpful. Her sister questioned if it was neccessary to take Megace because she had recently read that it can cause cancer, and she prefers to stop it. Her sister has encouraged her to eat so she doesn't lose weight. Her lab work from today shows Hg 10.9, WBC 7.2, ANC 4.8.   REVIEW OF SYSTEMS:   Constitutional: Denies fevers, chills or abnormal weight loss Eyes: Denies blurriness of vision Ears, nose, mouth, throat, and face: Denies mucositis or sore throat Respiratory: Denies cough, dyspnea or wheezes Cardiovascular: Denies palpitation, chest discomfort Gastrointestinal: Denies nausea, heartburn (+) loose stool,1x Skin: Denies abnormal skin rashes Lymphatics: Denies new lymphadenopathy or easy bruising Neurological: Denies numbness, tingling or new weaknesses Behavioral/Psych: Mood is stable, no new changes  Extremities: No lower extremity edema Breast: denies any pain or lumps or nodules in either breasts All other systems were reviewed with the patient and are negative.  I have reviewed the past medical history, past surgical history, social history and family history with the patient and they are unchanged from previous note.  ALLERGIES:  is allergic to hydrocodone.  MEDICATIONS:  Current Outpatient Medications  Medication Sig Dispense Refill  . ibuprofen (ADVIL,MOTRIN) 600 MG tablet Take 1 tablet (600 mg total) by mouth every 6 (six) hours as needed (for mild pain not relieved by other  medications.). 30 tablet 0  . lisinopril (PRINIVIL,ZESTRIL) 40 MG tablet Take 1 tablet (40 mg total) by mouth daily. 90 tablet 1  . megestrol (MEGACE) 40 MG tablet Take 3 tablets every day (Patient taking differently: Take 60 mg by mouth 2 (two) times daily with a meal. Take 3 tablets every day) 270 tablet 11  . ondansetron (ZOFRAN-ODT) 4 MG disintegrating tablet Take 1 tablet (4 mg total) by mouth every 6 (six) hours as needed for nausea. 15 tablet 0  . pantoprazole (PROTONIX) 40 MG tablet TAKE 1 TABLET BY MOUTH 30 MINUTES PRIOR TO BREAKFAST AND SUPPER (Patient taking differently: Take 40 mg by mouth 2 (two) times daily before a meal. TAKE 1 TABLET BY MOUTH 30 MINUTES PRIOR TO BREAKFAST AND SUPPER) 180 tablet 1  . polyethylene glycol (MIRALAX / GLYCOLAX) packet Take 17 g by mouth daily as needed for moderate constipation.    . pravastatin (PRAVACHOL) 40 MG tablet Take 1 tablet (40 mg total) by mouth daily. (Patient taking differently: Take 40 mg by mouth every evening. ) 90 tablet 3   No current facility-administered medications for this visit.    Facility-Administered Medications Ordered in Other Visits  Medication Dose Route Frequency Provider Last Rate Last Dose  . sodium chloride flush (NS) 0.9 % injection 10 mL  10 mL Intracatheter PRN Nicholas Lose, MD   10 mL at 06/17/18 1004    PHYSICAL EXAMINATION: ECOG PERFORMANCE STATUS: 1 - Symptomatic but completely ambulatory  Vitals:   06/17/18 1038  BP: (!) 148/93  Pulse: 93  Resp: 17  Temp: 98.6 F (37 C)  SpO2: 100%   Filed Weights   06/17/18 1038  Weight: 52.7 kg    GENERAL: alert, no distress and comfortable SKIN: skin color, texture, turgor are normal, no rashes or significant lesions EYES: normal, Conjunctiva are pink and non-injected, sclera clear OROPHARYNX: no exudate, no erythema and lips, buccal mucosa, and tongue normal  NECK: supple, thyroid normal size, non-tender, without nodularity LYMPH: no palpable  lymphadenopathy in the cervical, axillary or inguinal LUNGS: clear to auscultation and percussion with normal breathing effort HEART: regular rate & rhythm and no murmurs and no lower extremity edema ABDOMEN: abdomen soft, non-tender and normal bowel sounds MUSCULOSKELETAL: no cyanosis of digits and no clubbing  NEURO: alert & oriented x 3 with fluent speech, no focal motor/sensory deficits EXTREMITIES: No lower extremity edema  LABORATORY DATA:  I have reviewed the data as listed CMP Latest Ref Rng & Units 06/17/2018 06/10/2018 04/21/2018  Glucose 70 - 99 mg/dL 105(H) 94 104(H)  BUN 6 - 20 mg/dL 16 12 11   Creatinine 0.44 - 1.00 mg/dL 0.70 0.71 0.54  Sodium 135 - 145 mmol/L 143 143 140  Potassium 3.5 - 5.1 mmol/L 3.9 3.6 3.4(L)  Chloride 98 - 111 mmol/L 109 108 107  CO2 22 - 32 mmol/L 27 25 22   Calcium 8.9 - 10.3 mg/dL 9.3 9.3 9.4  Total Protein 6.5 - 8.1 g/dL 7.5 7.5 -  Total Bilirubin 0.3 - 1.2 mg/dL <0.2(L) 0.3 -  Alkaline Phos 38 - 126 U/L 107 108 -  AST 15 - 41 U/L 14(L) 13(L) -  ALT 0 - 44 U/L 22 17 -    Lab  Results  Component Value Date   WBC 7.2 06/17/2018   HGB 10.9 (L) 06/17/2018   HCT 33.3 (L) 06/17/2018   MCV 92.8 06/17/2018   PLT 357 06/17/2018   NEUTROABS 4.8 06/17/2018    ASSESSMENT & PLAN:  Malignant neoplasm of upper-outer quadrant of right breast in female, estrogen receptor positive ([place]) 05/01/18:Bilateral mastectomies: Left mastectomy: DCIS intermediate grade 0.9 cm margins negative Tis NX stage 0; right mastectomy: ILC, grade 3, 3.5 cm, with LCIS, perineural invasion present, margins negative, 15/22 lymph nodes positive with extracapsular extension, ER 90%, PR 0%, HER-2 +(IHC 3+), Ki-67 5%, T2N3 Stage 3B  06/03/2018 bone scan: Increased tracer uptake in the lumbar spine at L1, L3 and L4, right fourth and seventh ribs, inferior right scapula and distal sternum, T7 and T11 as well.  CT CAPshowed widespread bone metastases with possible pathologic fracture  at L3, peripheral right upper lobe nodules nonspecific Patient has cerebral palsy and hersisteris the power of attorney for healthcare Treatment plan: Palliative treatment with Taxol, Kingenti, Perjeta ------------------------------------------------------------------------------------------------------------------------ Current treatment: Cycle 1 day 8 Taxol Kingenti and Perjeta Chemo toxicities: 1.  One episode of diarrhea which resolved with Imodium She does not have any nausea or vomiting. Labs reviewed Monitoring closely for toxicities  Return to clinic weekly for chemotherapy.  No orders of the defined types were placed in this encounter.  The patient has a good understanding of the overall plan. she agrees with it. she will call with any problems that may develop before the next visit here.  Nicholas Lose, MD 06/17/2018  Julious Oka Dorshimer am acting as scribe for Dr. Nicholas Lose.  I have reviewed the above documentation for accuracy and completeness, and I agree with the above.

## 2018-06-17 ENCOUNTER — Inpatient Hospital Stay: Payer: Medicare Other | Attending: Hematology and Oncology | Admitting: Hematology and Oncology

## 2018-06-17 ENCOUNTER — Other Ambulatory Visit: Payer: Self-pay | Admitting: Hematology and Oncology

## 2018-06-17 ENCOUNTER — Inpatient Hospital Stay: Payer: Medicare Other

## 2018-06-17 ENCOUNTER — Encounter: Payer: Self-pay | Admitting: *Deleted

## 2018-06-17 VITALS — BP 148/93 | HR 93 | Temp 98.6°F | Resp 17 | Ht 62.0 in | Wt 116.1 lb

## 2018-06-17 DIAGNOSIS — Z170 Estrogen receptor positive status [ER+]: Secondary | ICD-10-CM | POA: Diagnosis not present

## 2018-06-17 DIAGNOSIS — Z5111 Encounter for antineoplastic chemotherapy: Secondary | ICD-10-CM | POA: Insufficient documentation

## 2018-06-17 DIAGNOSIS — C50411 Malignant neoplasm of upper-outer quadrant of right female breast: Secondary | ICD-10-CM

## 2018-06-17 DIAGNOSIS — G809 Cerebral palsy, unspecified: Secondary | ICD-10-CM | POA: Insufficient documentation

## 2018-06-17 DIAGNOSIS — C7951 Secondary malignant neoplasm of bone: Secondary | ICD-10-CM | POA: Insufficient documentation

## 2018-06-17 DIAGNOSIS — Z95828 Presence of other vascular implants and grafts: Secondary | ICD-10-CM

## 2018-06-17 DIAGNOSIS — R197 Diarrhea, unspecified: Secondary | ICD-10-CM | POA: Diagnosis not present

## 2018-06-17 DIAGNOSIS — Z9221 Personal history of antineoplastic chemotherapy: Secondary | ICD-10-CM

## 2018-06-17 DIAGNOSIS — Z9012 Acquired absence of left breast and nipple: Secondary | ICD-10-CM | POA: Diagnosis not present

## 2018-06-17 DIAGNOSIS — Z79899 Other long term (current) drug therapy: Secondary | ICD-10-CM | POA: Diagnosis not present

## 2018-06-17 DIAGNOSIS — Z5112 Encounter for antineoplastic immunotherapy: Secondary | ICD-10-CM | POA: Insufficient documentation

## 2018-06-17 LAB — CMP (CANCER CENTER ONLY)
ALBUMIN: 3.6 g/dL (ref 3.5–5.0)
ALT: 22 U/L (ref 0–44)
AST: 14 U/L — ABNORMAL LOW (ref 15–41)
Alkaline Phosphatase: 107 U/L (ref 38–126)
Anion gap: 7 (ref 5–15)
BUN: 16 mg/dL (ref 6–20)
CO2: 27 mmol/L (ref 22–32)
Calcium: 9.3 mg/dL (ref 8.9–10.3)
Chloride: 109 mmol/L (ref 98–111)
Creatinine: 0.7 mg/dL (ref 0.44–1.00)
GFR, Est AFR Am: >60 mL/min (ref >60)
GFR, Estimated: >60 mL/min (ref >60)
Glucose, Bld: 105 mg/dL — ABNORMAL HIGH (ref 70–99)
Potassium: 3.9 mmol/L (ref 3.5–5.1)
Sodium: 143 mmol/L (ref 135–145)
Total Protein: 7.5 g/dL (ref 6.5–8.1)

## 2018-06-17 LAB — CBC WITH DIFFERENTIAL (CANCER CENTER ONLY)
Abs Immature Granulocytes: 0.03 10*3/uL (ref 0.00–0.07)
Basophils Absolute: 0 10*3/uL (ref 0.0–0.1)
Basophils Relative: 0 %
Eosinophils Absolute: 0.1 10*3/uL (ref 0.0–0.5)
Eosinophils Relative: 2 %
HCT: 33.3 % — ABNORMAL LOW (ref 36.0–46.0)
Hemoglobin: 10.9 g/dL — ABNORMAL LOW (ref 12.0–15.0)
Immature Granulocytes: 0 %
Lymphocytes Relative: 27 %
Lymphs Abs: 1.9 10*3/uL (ref 0.7–4.0)
MCH: 30.4 pg (ref 26.0–34.0)
MCHC: 32.7 g/dL (ref 30.0–36.0)
MCV: 92.8 fL (ref 80.0–100.0)
Monocytes Absolute: 0.3 10*3/uL (ref 0.1–1.0)
Monocytes Relative: 5 %
Neutro Abs: 4.8 10*3/uL (ref 1.7–7.7)
Neutrophils Relative %: 66 %
PLATELETS: 357 10*3/uL (ref 150–400)
RBC: 3.59 MIL/uL — ABNORMAL LOW (ref 3.87–5.11)
RDW: 13.7 % (ref 11.5–15.5)
WBC Count: 7.2 10*3/uL (ref 4.0–10.5)
nRBC: 0 % (ref 0.0–0.2)

## 2018-06-17 MED ORDER — DIPHENHYDRAMINE HCL 50 MG/ML IJ SOLN
INTRAMUSCULAR | Status: AC
  Filled 2018-06-17: qty 1

## 2018-06-17 MED ORDER — SODIUM CHLORIDE 0.9% FLUSH
10.0000 mL | INTRAVENOUS | Status: DC | PRN
  Administered 2018-06-17: 10 mL
  Filled 2018-06-17: qty 10

## 2018-06-17 MED ORDER — SODIUM CHLORIDE 0.9 % IV SOLN
Freq: Once | INTRAVENOUS | Status: DC
  Filled 2018-06-17: qty 250

## 2018-06-17 MED ORDER — SODIUM CHLORIDE 0.9 % IV SOLN
60.0000 mg/m2 | Freq: Once | INTRAVENOUS | Status: AC
  Administered 2018-06-17: 96 mg via INTRAVENOUS
  Filled 2018-06-17: qty 16

## 2018-06-17 MED ORDER — DEXAMETHASONE SODIUM PHOSPHATE 10 MG/ML IJ SOLN
10.0000 mg | Freq: Once | INTRAMUSCULAR | Status: AC
  Administered 2018-06-17: 10 mg via INTRAVENOUS

## 2018-06-17 MED ORDER — DEXAMETHASONE SODIUM PHOSPHATE 10 MG/ML IJ SOLN
INTRAMUSCULAR | Status: AC
  Filled 2018-06-17: qty 1

## 2018-06-17 MED ORDER — FAMOTIDINE IN NACL 20-0.9 MG/50ML-% IV SOLN
INTRAVENOUS | Status: AC
  Filled 2018-06-17: qty 50

## 2018-06-17 MED ORDER — FAMOTIDINE IN NACL 20-0.9 MG/50ML-% IV SOLN
20.0000 mg | Freq: Once | INTRAVENOUS | Status: AC
  Administered 2018-06-17: 20 mg via INTRAVENOUS

## 2018-06-17 MED ORDER — HEPARIN SOD (PORK) LOCK FLUSH 100 UNIT/ML IV SOLN
500.0000 [IU] | Freq: Once | INTRAVENOUS | Status: AC | PRN
  Administered 2018-06-17: 500 [IU]
  Filled 2018-06-17: qty 5

## 2018-06-17 MED ORDER — DIPHENHYDRAMINE HCL 50 MG/ML IJ SOLN
25.0000 mg | Freq: Once | INTRAMUSCULAR | Status: AC
  Administered 2018-06-17: 25 mg via INTRAVENOUS

## 2018-06-17 NOTE — Progress Notes (Signed)
Continue taxol at 60mg /m2 per dr Lindi Adie

## 2018-06-17 NOTE — Addendum Note (Signed)
Addended by: Nicholas Lose on: 06/17/2018 11:20 AM   Modules accepted: Orders

## 2018-06-17 NOTE — Patient Instructions (Signed)
[facility] Discharge Instructions for Patients Receiving Chemotherapy  Today you received the following chemotherapy agents TAXOL,HERCEPTIN,PERJETA To help prevent nausea and vomiting after your treatment, we encourage you to take your nausea medicationas prescribed.  If you develop nausea and vomiting that is not controlled by your nausea medication, call the clinic.   BELOW ARE SYMPTOMS THAT SHOULD BE REPORTED IMMEDIATELY:  *FEVER GREATER THAN 100.5 F  *CHILLS WITH OR WITHOUT FEVER  NAUSEA AND VOMITING THAT IS NOT CONTROLLED WITH YOUR NAUSEA MEDICATION  *UNUSUAL SHORTNESS OF BREATH  *UNUSUAL BRUISING OR BLEEDING  TENDERNESS IN MOUTH AND THROAT WITH OR WITHOUT PRESENCE OF ULCERS  *URINARY PROBLEMS  *BOWEL PROBLEMS  UNUSUAL RASH Items with * indicate a potential emergency and should be followed up as soon as possible.  Feel free to call the clinic should you have any questions or concerns. The clinic phone number is [phone].  Please show the [place] at check-in to the Emergency Department and triage nurse.

## 2018-06-17 NOTE — Assessment & Plan Note (Signed)
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  CT CAPshowed widespread bone metastases with possible pathologic fracture at L3, peripheral right upper lobe nodules nonspecific Patient has cerebral palsy and hersisteris the power of attorney for healthcare Treatment plan: Palliative treatment with Taxol, Kingenti, Perjeta ------------------------------------------------------------------------------------------------------------------------ Current treatment: Cycle 1 day 8 Taxol Kingenti and Perjeta Chemo toxicities:  Labs reviewed Monitoring closely for toxicities  Return to clinic weekly for chemotherapy.

## 2018-06-22 ENCOUNTER — Encounter: Payer: Self-pay | Admitting: *Deleted

## 2018-06-22 ENCOUNTER — Telehealth: Payer: Self-pay | Admitting: Hematology and Oncology

## 2018-06-22 NOTE — Telephone Encounter (Signed)
Called regarding 2/12 °

## 2018-06-24 ENCOUNTER — Inpatient Hospital Stay: Payer: Medicare Other

## 2018-06-24 ENCOUNTER — Encounter: Payer: Self-pay | Admitting: *Deleted

## 2018-06-24 VITALS — BP 158/93 | HR 88 | Temp 98.0°F | Resp 18

## 2018-06-24 DIAGNOSIS — Z5112 Encounter for antineoplastic immunotherapy: Secondary | ICD-10-CM | POA: Diagnosis not present

## 2018-06-24 DIAGNOSIS — C7951 Secondary malignant neoplasm of bone: Secondary | ICD-10-CM | POA: Diagnosis not present

## 2018-06-24 DIAGNOSIS — Z9012 Acquired absence of left breast and nipple: Secondary | ICD-10-CM | POA: Diagnosis not present

## 2018-06-24 DIAGNOSIS — Z170 Estrogen receptor positive status [ER+]: Principal | ICD-10-CM

## 2018-06-24 DIAGNOSIS — C50411 Malignant neoplasm of upper-outer quadrant of right female breast: Secondary | ICD-10-CM

## 2018-06-24 DIAGNOSIS — Z95828 Presence of other vascular implants and grafts: Secondary | ICD-10-CM

## 2018-06-24 DIAGNOSIS — G809 Cerebral palsy, unspecified: Secondary | ICD-10-CM | POA: Diagnosis not present

## 2018-06-24 DIAGNOSIS — R197 Diarrhea, unspecified: Secondary | ICD-10-CM | POA: Diagnosis not present

## 2018-06-24 DIAGNOSIS — Z79899 Other long term (current) drug therapy: Secondary | ICD-10-CM | POA: Diagnosis not present

## 2018-06-24 DIAGNOSIS — Z5111 Encounter for antineoplastic chemotherapy: Secondary | ICD-10-CM | POA: Diagnosis not present

## 2018-06-24 DIAGNOSIS — Z9221 Personal history of antineoplastic chemotherapy: Secondary | ICD-10-CM | POA: Diagnosis not present

## 2018-06-24 LAB — CBC WITH DIFFERENTIAL (CANCER CENTER ONLY)
Abs Immature Granulocytes: 0.03 10*3/uL (ref 0.00–0.07)
BASOS PCT: 1 %
Basophils Absolute: 0 10*3/uL (ref 0.0–0.1)
Eosinophils Absolute: 0.1 10*3/uL (ref 0.0–0.5)
Eosinophils Relative: 2 %
HCT: 32.9 % — ABNORMAL LOW (ref 36.0–46.0)
Hemoglobin: 10.8 g/dL — ABNORMAL LOW (ref 12.0–15.0)
Immature Granulocytes: 1 %
Lymphocytes Relative: 35 %
Lymphs Abs: 1.9 10*3/uL (ref 0.7–4.0)
MCH: 30.8 pg (ref 26.0–34.0)
MCHC: 32.8 g/dL (ref 30.0–36.0)
MCV: 93.7 fL (ref 80.0–100.0)
Monocytes Absolute: 0.4 10*3/uL (ref 0.1–1.0)
Monocytes Relative: 8 %
Neutro Abs: 2.9 10*3/uL (ref 1.7–7.7)
Neutrophils Relative %: 53 %
PLATELETS: 390 10*3/uL (ref 150–400)
RBC: 3.51 MIL/uL — ABNORMAL LOW (ref 3.87–5.11)
RDW: 13.9 % (ref 11.5–15.5)
WBC Count: 5.4 10*3/uL (ref 4.0–10.5)
nRBC: 0 % (ref 0.0–0.2)

## 2018-06-24 LAB — CMP (CANCER CENTER ONLY)
ALT: 16 U/L (ref 0–44)
AST: 11 U/L — ABNORMAL LOW (ref 15–41)
Albumin: 3.6 g/dL (ref 3.5–5.0)
Alkaline Phosphatase: 106 U/L (ref 38–126)
Anion gap: 8 (ref 5–15)
BUN: 13 mg/dL (ref 6–20)
CO2: 27 mmol/L (ref 22–32)
Calcium: 9.2 mg/dL (ref 8.9–10.3)
Chloride: 107 mmol/L (ref 98–111)
Creatinine: 0.73 mg/dL (ref 0.44–1.00)
GFR, Est AFR Am: >60 mL/min (ref >60)
GFR, Estimated: >60 mL/min (ref >60)
GLUCOSE: 100 mg/dL — AB (ref 70–99)
Potassium: 4.1 mmol/L (ref 3.5–5.1)
Sodium: 142 mmol/L (ref 135–145)
TOTAL PROTEIN: 7.5 g/dL (ref 6.5–8.1)
Total Bilirubin: <0.2 mg/dL — ABNORMAL LOW (ref 0.3–1.2)

## 2018-06-24 MED ORDER — FAMOTIDINE IN NACL 20-0.9 MG/50ML-% IV SOLN
INTRAVENOUS | Status: AC
  Filled 2018-06-24: qty 50

## 2018-06-24 MED ORDER — SODIUM CHLORIDE 0.9 % IV SOLN
Freq: Once | INTRAVENOUS | Status: AC
  Administered 2018-06-24: 15:00:00 via INTRAVENOUS
  Filled 2018-06-24: qty 250

## 2018-06-24 MED ORDER — DIPHENHYDRAMINE HCL 50 MG/ML IJ SOLN
25.0000 mg | Freq: Once | INTRAMUSCULAR | Status: AC
  Administered 2018-06-24: 25 mg via INTRAVENOUS

## 2018-06-24 MED ORDER — HEPARIN SOD (PORK) LOCK FLUSH 100 UNIT/ML IV SOLN
500.0000 [IU] | Freq: Once | INTRAVENOUS | Status: AC | PRN
  Administered 2018-06-24: 500 [IU]
  Filled 2018-06-24: qty 5

## 2018-06-24 MED ORDER — SODIUM CHLORIDE 0.9 % IV SOLN
60.0000 mg/m2 | Freq: Once | INTRAVENOUS | Status: AC
  Administered 2018-06-24: 96 mg via INTRAVENOUS
  Filled 2018-06-24: qty 16

## 2018-06-24 MED ORDER — SODIUM CHLORIDE 0.9% FLUSH
10.0000 mL | INTRAVENOUS | Status: DC | PRN
  Administered 2018-06-24: 10 mL
  Filled 2018-06-24: qty 10

## 2018-06-24 MED ORDER — DEXAMETHASONE SODIUM PHOSPHATE 10 MG/ML IJ SOLN
INTRAMUSCULAR | Status: AC
  Filled 2018-06-24: qty 1

## 2018-06-24 MED ORDER — FAMOTIDINE IN NACL 20-0.9 MG/50ML-% IV SOLN
20.0000 mg | Freq: Once | INTRAVENOUS | Status: AC
  Administered 2018-06-24: 20 mg via INTRAVENOUS

## 2018-06-24 MED ORDER — DEXAMETHASONE SODIUM PHOSPHATE 10 MG/ML IJ SOLN
10.0000 mg | Freq: Once | INTRAMUSCULAR | Status: AC
  Administered 2018-06-24: 10 mg via INTRAVENOUS

## 2018-06-24 MED ORDER — DIPHENHYDRAMINE HCL 50 MG/ML IJ SOLN
INTRAMUSCULAR | Status: AC
  Filled 2018-06-24: qty 1

## 2018-06-30 NOTE — Progress Notes (Signed)
Patient Care Team: Janora Norlander, DO as PCP - General (Family Medicine) Danie Binder, MD (Gastroenterology) Alphonsa Overall, MD as Consulting Physician (General Surgery) Nicholas Lose, MD as Consulting Physician (Hematology and Oncology) Kyung Rudd, MD as Consulting Physician (Radiation Oncology)  DIAGNOSIS:    ICD-10-CM   1. Malignant neoplasm of upper-outer quadrant of right breast in female, estrogen receptor positive ([place]) C50.411    Z17.0     SUMMARY OF ONCOLOGIC HISTORY:   Malignant neoplasm of upper-outer quadrant of right breast in female, estrogen receptor positive ([place])   03/18/2018 Initial Diagnosis    Screening detected bilateral breast abnormalities.  Right breast mass UOQ 2.1 cm at 10 o'clock position: Grade 1 ILC ER 70%, PR 0%, HER-2 +3+ by IHC, Ki-67 5%, suspected lymph node in the axilla could not be biopsied; left breast calcifications by ultrasound not visible but 5 cm cystic/tubular structure which on biopsy was fibrocystic change with ADHl; T2N0 stage IIa clinical stage    05/01/2018 Surgery    Bilateral mastectomies: Left mastectomy: DCIS intermediate grade 0.9 cm margins negative Tis NX stage 0; right mastectomy: ILC, grade 3, 3.5 cm, with LCIS, perineural invasion present, margins negative, 15/22 lymph nodes positive with extracapsular extension, ER 90%, PR 0%, HER-2 +(IHC 3+), Ki-67 5%, T2N3 Stage 3B     05/11/2018 Cancer Staging    Staging form: Breast, AJCC 8th Edition - Pathologic stage from 05/11/2018: Stage IIIB (pT2, pN3, cM0, G3, ER+, PR-, HER2+) - Signed by Nicholas Lose, MD on 05/11/2018    06/03/2018 Imaging    Bone scan: Increased tracer uptake in the lumbar spine at L1, L3 and L4, right fourth and seventh ribs, inferior right scapula and distal sternum, T7 and T11 as well.   CT CAP showed widespread bone metastases with possible pathologic fracture at L3, peripheral right upper lobe nodules nonspecific    06/10/2018 -  Chemotherapy   Taxol Kingenti and Perjeta      CHIEF COMPLIANT: Cycle 3 TaxolKingentiand Perjeta  INTERVAL HISTORY: GINNETTE Martin is a 53 y.o. with above-mentioned history of metastatic breast cancer who is currently on chemotherapy treatment with TaxolKingentiand Perjeta. She presents to the clinic today with her two sisters, one of whom is her legal guardian, for cycle 3 of treatment. She denies nausea but notes occasional diarrhea 1-2x weekly that resolves with Imodium. She denies tingling or numbness of hands and feet. She reports normal appetite and hair thinning. Her sister reports she stopped taking megestrol and her menstrual cycles have returned, and requested if she could take it again to prevent menstruation. Her labs from today show: WBC 8.0, Hg 11.0, platelets 399, ANC 5.3.   REVIEW OF SYSTEMS:   Constitutional: Denies fevers, chills or abnormal weight loss (+) hair thinning Eyes: Denies blurriness of vision Ears, nose, mouth, throat, and face: Denies mucositis or sore throat Respiratory: Denies cough, dyspnea or wheezes Cardiovascular: Denies palpitation, chest discomfort Gastrointestinal: Denies nausea, heartburn (+) diarrhea Skin: Denies abnormal skin rashes Lymphatics: Denies new lymphadenopathy or easy bruising Neurological: Denies numbness, tingling or new weaknesses Behavioral/Psych: Mood is stable, no new changes  Extremities: No lower extremity edema Breast: denies any pain or lumps or nodules in either breasts All other systems were reviewed with the patient and are negative.  I have reviewed the past medical history, past surgical history, social history and family history with the patient and they are unchanged from previous note.  ALLERGIES:  is allergic to hydrocodone.  MEDICATIONS:  Current  Outpatient Medications  Medication Sig Dispense Refill  . ibuprofen (ADVIL,MOTRIN) 600 MG tablet Take 1 tablet (600 mg total) by mouth every 6 (six) hours as needed (for mild pain  not relieved by other medications.). 30 tablet 0  . lisinopril (PRINIVIL,ZESTRIL) 40 MG tablet Take 1 tablet (40 mg total) by mouth daily. 90 tablet 1  . megestrol (MEGACE) 40 MG tablet Take 3 tablets every day (Patient taking differently: Take 60 mg by mouth 2 (two) times daily with a meal. Take 3 tablets every day) 270 tablet 11  . ondansetron (ZOFRAN-ODT) 4 MG disintegrating tablet Take 1 tablet (4 mg total) by mouth every 6 (six) hours as needed for nausea. 15 tablet 0  . pantoprazole (PROTONIX) 40 MG tablet TAKE 1 TABLET BY MOUTH 30 MINUTES PRIOR TO BREAKFAST AND SUPPER (Patient taking differently: Take 40 mg by mouth 2 (two) times daily before a meal. TAKE 1 TABLET BY MOUTH 30 MINUTES PRIOR TO BREAKFAST AND SUPPER) 180 tablet 1  . polyethylene glycol (MIRALAX / GLYCOLAX) packet Take 17 g by mouth daily as needed for moderate constipation.    . pravastatin (PRAVACHOL) 40 MG tablet Take 1 tablet (40 mg total) by mouth daily. (Patient taking differently: Take 40 mg by mouth every evening. ) 90 tablet 3   No current facility-administered medications for this visit.    Facility-Administered Medications Ordered in Other Visits  Medication Dose Route Frequency Provider Last Rate Last Dose  . sodium chloride flush (NS) 0.9 % injection 10 mL  10 mL Intracatheter PRN Nicholas Lose, MD   10 mL at 07/01/18 0900    PHYSICAL EXAMINATION: ECOG PERFORMANCE STATUS: 1 - Symptomatic but completely ambulatory  Vitals:   07/01/18 0932  BP: (!) 151/91  Pulse: 86  Resp: 17  Temp: 98.7 F (37.1 C)  SpO2: 100%   Filed Weights   07/01/18 0932  Weight: 122 lb 11.2 oz (55.7 kg)    GENERAL: alert, no distress and comfortable SKIN: skin color, texture, turgor are normal, no rashes or significant lesions EYES: normal, Conjunctiva are pink and non-injected, sclera clear OROPHARYNX: no exudate, no erythema and lips, buccal mucosa, and tongue normal  NECK: supple, thyroid normal size, non-tender, without  nodularity LYMPH: no palpable lymphadenopathy in the cervical, axillary or inguinal LUNGS: clear to auscultation and percussion with normal breathing effort HEART: regular rate & rhythm and no murmurs and no lower extremity edema ABDOMEN: abdomen soft, non-tender and normal bowel sounds MUSCULOSKELETAL: no cyanosis of digits and no clubbing  NEURO: alert & oriented x 3 with fluent speech, no focal motor/sensory deficits EXTREMITIES: No lower extremity edema  LABORATORY DATA:  I have reviewed the data as listed CMP Latest Ref Rng & Units 07/01/2018 06/24/2018 06/17/2018  Glucose 70 - 99 mg/dL 99 100(H) 105(H)  BUN 6 - 20 mg/dL _0 Creatinine 0.44 - 1.00 mg/dL 0.68 0.73 0.70  Sodium 135 - 145 mmol/L 142 142 143  Potassium 3.5 - 5.1 mmol/L 3.9 4.1 3.9  Chloride 98 - 111 mmol/L 108 107 109  CO2 22 - 32 mmol/L _1 Calcium 8.9 - 10.3 mg/dL 9.2 9.2 9.3  Total Protein 6.5 - 8.1 g/dL 7.5 7.5 7.5  Total Bilirubin 0.3 - 1.2 mg/dL 0.3 <0.2(L) <0.2(L)  Alkaline Phos 38 - 126 U/L 107 106 107  AST 15 - 41 U/L 13(L) 11(L) 14(L)  ALT 0 - 44 U/L _2 Lab Results  Component Value Date  WBC 8.0 07/01/2018   HGB 11.0 (L) 07/01/2018   HCT 33.5 (L) 07/01/2018   MCV 93.6 07/01/2018   PLT 399 07/01/2018   NEUTROABS 5.3 07/01/2018    ASSESSMENT & PLAN:  Malignant neoplasm of upper-outer quadrant of right breast in female, estrogen receptor positive ([place]) 05/01/18:Bilateral mastectomies: Left mastectomy: DCIS intermediate grade 0.9 cm margins negative Tis NX stage 0; right mastectomy: ILC, grade 3, 3.5 cm, with LCIS, perineural invasion present, margins negative, 15/22 lymph nodes positive with extracapsular extension, ER 90%, PR 0%, HER-2 +(IHC 3+), Ki-67 5%, T2N3 Stage 3B  06/03/2018 bone scan: Increased tracer uptake in the lumbar spine at L1, L3 and L4, right fourth and seventh ribs, inferior right scapula and distal sternum, T7 and T11 as well.  CT CAPshowed widespread bone  metastases with possible pathologic fracture at L3, peripheral right upper lobe nodules nonspecific Patient has cerebral palsy and hersisteris the power of attorney for healthcare Treatment plan: Palliative treatment with Taxol,Kingenti, Perjeta ------------------------------------------------------------------------------------------------------------------------ Current treatment: Cycle 4 day 1 TaxolKingentiand Perjeta Chemo toxicities: 1.  One episode of diarrhea which resolved with Imodium She does not have any nausea or vomiting. Labs reviewed Monitoring closely for toxicities  Return to clinic weekly for chemotherapy and every other week for follow-up with me.    No orders of the defined types were placed in this encounter.  The patient has a good understanding of the overall plan. she agrees with it. she will call with any problems that may develop before the next visit here.  Nicholas Lose, MD 07/01/2018  Julious Oka Dorshimer am acting as scribe for Dr. Nicholas Lose.  I have reviewed the above documentation for accuracy and completeness, and I agree with the above.

## 2018-07-01 ENCOUNTER — Telehealth: Payer: Self-pay | Admitting: Hematology and Oncology

## 2018-07-01 ENCOUNTER — Inpatient Hospital Stay: Payer: Medicare Other

## 2018-07-01 ENCOUNTER — Encounter: Payer: Self-pay | Admitting: *Deleted

## 2018-07-01 ENCOUNTER — Inpatient Hospital Stay (HOSPITAL_BASED_OUTPATIENT_CLINIC_OR_DEPARTMENT_OTHER): Payer: Medicare Other | Admitting: Hematology and Oncology

## 2018-07-01 DIAGNOSIS — Z9012 Acquired absence of left breast and nipple: Secondary | ICD-10-CM | POA: Diagnosis not present

## 2018-07-01 DIAGNOSIS — C50411 Malignant neoplasm of upper-outer quadrant of right female breast: Secondary | ICD-10-CM

## 2018-07-01 DIAGNOSIS — Z170 Estrogen receptor positive status [ER+]: Secondary | ICD-10-CM

## 2018-07-01 DIAGNOSIS — R197 Diarrhea, unspecified: Secondary | ICD-10-CM

## 2018-07-01 DIAGNOSIS — C7951 Secondary malignant neoplasm of bone: Secondary | ICD-10-CM | POA: Diagnosis not present

## 2018-07-01 DIAGNOSIS — Z5111 Encounter for antineoplastic chemotherapy: Secondary | ICD-10-CM

## 2018-07-01 DIAGNOSIS — Z9221 Personal history of antineoplastic chemotherapy: Secondary | ICD-10-CM

## 2018-07-01 DIAGNOSIS — Z79899 Other long term (current) drug therapy: Secondary | ICD-10-CM

## 2018-07-01 DIAGNOSIS — Z95828 Presence of other vascular implants and grafts: Secondary | ICD-10-CM

## 2018-07-01 DIAGNOSIS — G809 Cerebral palsy, unspecified: Secondary | ICD-10-CM

## 2018-07-01 DIAGNOSIS — Z5112 Encounter for antineoplastic immunotherapy: Secondary | ICD-10-CM

## 2018-07-01 LAB — CBC WITH DIFFERENTIAL (CANCER CENTER ONLY)
Abs Immature Granulocytes: 0.03 10*3/uL (ref 0.00–0.07)
BASOS PCT: 1 %
Basophils Absolute: 0.1 10*3/uL (ref 0.0–0.1)
Eosinophils Absolute: 0.1 10*3/uL (ref 0.0–0.5)
Eosinophils Relative: 1 %
HCT: 33.5 % — ABNORMAL LOW (ref 36.0–46.0)
Hemoglobin: 11 g/dL — ABNORMAL LOW (ref 12.0–15.0)
Immature Granulocytes: 0 %
Lymphocytes Relative: 26 %
Lymphs Abs: 2.1 10*3/uL (ref 0.7–4.0)
MCH: 30.7 pg (ref 26.0–34.0)
MCHC: 32.8 g/dL (ref 30.0–36.0)
MCV: 93.6 fL (ref 80.0–100.0)
Monocytes Absolute: 0.4 10*3/uL (ref 0.1–1.0)
Monocytes Relative: 6 %
Neutro Abs: 5.3 10*3/uL (ref 1.7–7.7)
Neutrophils Relative %: 66 %
Platelet Count: 399 10*3/uL (ref 150–400)
RBC: 3.58 MIL/uL — ABNORMAL LOW (ref 3.87–5.11)
RDW: 13.9 % (ref 11.5–15.5)
WBC Count: 8 10*3/uL (ref 4.0–10.5)
nRBC: 0 % (ref 0.0–0.2)

## 2018-07-01 LAB — CMP (CANCER CENTER ONLY)
ALT: 18 U/L (ref 0–44)
AST: 13 U/L — ABNORMAL LOW (ref 15–41)
Albumin: 3.7 g/dL (ref 3.5–5.0)
Alkaline Phosphatase: 107 U/L (ref 38–126)
Anion gap: 9 (ref 5–15)
BILIRUBIN TOTAL: 0.3 mg/dL (ref 0.3–1.2)
BUN: 16 mg/dL (ref 6–20)
CO2: 25 mmol/L (ref 22–32)
Calcium: 9.2 mg/dL (ref 8.9–10.3)
Chloride: 108 mmol/L (ref 98–111)
Creatinine: 0.68 mg/dL (ref 0.44–1.00)
GFR, Est AFR Am: >60 mL/min (ref >60)
Glucose, Bld: 99 mg/dL (ref 70–99)
Potassium: 3.9 mmol/L (ref 3.5–5.1)
Sodium: 142 mmol/L (ref 135–145)
TOTAL PROTEIN: 7.5 g/dL (ref 6.5–8.1)

## 2018-07-01 MED ORDER — SODIUM CHLORIDE 0.9% FLUSH
10.0000 mL | INTRAVENOUS | Status: DC | PRN
  Administered 2018-07-01: 10 mL
  Filled 2018-07-01: qty 10

## 2018-07-01 MED ORDER — SODIUM CHLORIDE 0.9 % IV SOLN
60.0000 mg/m2 | Freq: Once | INTRAVENOUS | Status: AC
  Administered 2018-07-01: 96 mg via INTRAVENOUS
  Filled 2018-07-01: qty 16

## 2018-07-01 MED ORDER — FAMOTIDINE IN NACL 20-0.9 MG/50ML-% IV SOLN
20.0000 mg | Freq: Once | INTRAVENOUS | Status: AC
  Administered 2018-07-01: 20 mg via INTRAVENOUS

## 2018-07-01 MED ORDER — TRASTUZUMAB CHEMO 150 MG IV SOLR
6.0000 mg/kg | Freq: Once | INTRAVENOUS | Status: AC
  Administered 2018-07-01: 336 mg via INTRAVENOUS
  Filled 2018-07-01: qty 16

## 2018-07-01 MED ORDER — FAMOTIDINE IN NACL 20-0.9 MG/50ML-% IV SOLN
INTRAVENOUS | Status: AC
  Filled 2018-07-01: qty 50

## 2018-07-01 MED ORDER — DEXAMETHASONE SODIUM PHOSPHATE 10 MG/ML IJ SOLN
10.0000 mg | Freq: Once | INTRAMUSCULAR | Status: AC
  Administered 2018-07-01: 10 mg via INTRAVENOUS

## 2018-07-01 MED ORDER — SODIUM CHLORIDE 0.9 % IV SOLN
420.0000 mg | Freq: Once | INTRAVENOUS | Status: AC
  Administered 2018-07-01: 420 mg via INTRAVENOUS
  Filled 2018-07-01: qty 14

## 2018-07-01 MED ORDER — DIPHENHYDRAMINE HCL 50 MG/ML IJ SOLN
INTRAMUSCULAR | Status: AC
  Filled 2018-07-01: qty 1

## 2018-07-01 MED ORDER — ACETAMINOPHEN 325 MG PO TABS
650.0000 mg | ORAL_TABLET | Freq: Once | ORAL | Status: AC
  Administered 2018-07-01: 650 mg via ORAL

## 2018-07-01 MED ORDER — SODIUM CHLORIDE 0.9 % IV SOLN
Freq: Once | INTRAVENOUS | Status: AC
  Administered 2018-07-01: 10:00:00 via INTRAVENOUS
  Filled 2018-07-01: qty 250

## 2018-07-01 MED ORDER — ACETAMINOPHEN 325 MG PO TABS
ORAL_TABLET | ORAL | Status: AC
  Filled 2018-07-01: qty 2

## 2018-07-01 MED ORDER — DIPHENHYDRAMINE HCL 50 MG/ML IJ SOLN
25.0000 mg | Freq: Once | INTRAMUSCULAR | Status: AC
  Administered 2018-07-01: 25 mg via INTRAVENOUS

## 2018-07-01 MED ORDER — DEXAMETHASONE SODIUM PHOSPHATE 10 MG/ML IJ SOLN
INTRAMUSCULAR | Status: AC
  Filled 2018-07-01: qty 1

## 2018-07-01 MED ORDER — PALONOSETRON HCL INJECTION 0.25 MG/5ML
INTRAVENOUS | Status: AC
  Filled 2018-07-01: qty 5

## 2018-07-01 MED ORDER — HEPARIN SOD (PORK) LOCK FLUSH 100 UNIT/ML IV SOLN
500.0000 [IU] | Freq: Once | INTRAVENOUS | Status: AC | PRN
  Administered 2018-07-01: 500 [IU]
  Filled 2018-07-01: qty 5

## 2018-07-01 NOTE — Telephone Encounter (Signed)
Scheduled appt per 2/19 sch message - pt to get a new schedule in treatment area.

## 2018-07-01 NOTE — Assessment & Plan Note (Signed)
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  CT CAPshowed widespread bone metastases with possible pathologic fracture at L3, peripheral right upper lobe nodules nonspecific Patient has cerebral palsy and hersisteris the power of attorney for healthcare Treatment plan: Palliative treatment with Taxol,Kingenti, Perjeta ------------------------------------------------------------------------------------------------------------------------ Current treatment: Cycle 4 day 1 TaxolKingentiand Perjeta Chemo toxicities: 1.  One episode of diarrhea which resolved with Imodium She does not have any nausea or vomiting. Labs reviewed Monitoring closely for toxicities  Return to clinic weekly for chemotherapy and every other week for follow-up with me.

## 2018-07-01 NOTE — Patient Instructions (Signed)
[facility] Discharge Instructions for Patients Receiving Chemotherapy  Today you received the following chemotherapy agents : Herceptin, Perjeta, Taxol.  To help prevent nausea and vomiting after your treatment, we encourage you to take your nausea medication as prescribed.   If you develop nausea and vomiting that is not controlled by your nausea medication, call the clinic.   BELOW ARE SYMPTOMS THAT SHOULD BE REPORTED IMMEDIATELY:  *FEVER GREATER THAN 100.5 F  *CHILLS WITH OR WITHOUT FEVER  NAUSEA AND VOMITING THAT IS NOT CONTROLLED WITH YOUR NAUSEA MEDICATION  *UNUSUAL SHORTNESS OF BREATH  *UNUSUAL BRUISING OR BLEEDING  TENDERNESS IN MOUTH AND THROAT WITH OR WITHOUT PRESENCE OF ULCERS  *URINARY PROBLEMS  *BOWEL PROBLEMS  UNUSUAL RASH Items with * indicate a potential emergency and should be followed up as soon as possible.  Feel free to call the clinic should you have any questions or concerns. The clinic phone number is [phone].  Please show the CHEMO ALERT CARD at check-in to the Emergency Department and triage nurse.   

## 2018-07-03 ENCOUNTER — Ambulatory Visit: Payer: Medicare Other

## 2018-07-08 ENCOUNTER — Inpatient Hospital Stay: Payer: Medicare Other

## 2018-07-08 ENCOUNTER — Encounter: Payer: Self-pay | Admitting: *Deleted

## 2018-07-08 VITALS — BP 156/90 | HR 71 | Temp 98.6°F | Resp 18

## 2018-07-08 DIAGNOSIS — Z5111 Encounter for antineoplastic chemotherapy: Secondary | ICD-10-CM | POA: Diagnosis not present

## 2018-07-08 DIAGNOSIS — C50411 Malignant neoplasm of upper-outer quadrant of right female breast: Secondary | ICD-10-CM | POA: Diagnosis not present

## 2018-07-08 DIAGNOSIS — Z95828 Presence of other vascular implants and grafts: Secondary | ICD-10-CM

## 2018-07-08 DIAGNOSIS — R197 Diarrhea, unspecified: Secondary | ICD-10-CM | POA: Diagnosis not present

## 2018-07-08 DIAGNOSIS — Z79899 Other long term (current) drug therapy: Secondary | ICD-10-CM | POA: Diagnosis not present

## 2018-07-08 DIAGNOSIS — Z9221 Personal history of antineoplastic chemotherapy: Secondary | ICD-10-CM | POA: Diagnosis not present

## 2018-07-08 DIAGNOSIS — Z5112 Encounter for antineoplastic immunotherapy: Secondary | ICD-10-CM | POA: Diagnosis not present

## 2018-07-08 DIAGNOSIS — Z170 Estrogen receptor positive status [ER+]: Secondary | ICD-10-CM | POA: Diagnosis not present

## 2018-07-08 DIAGNOSIS — G809 Cerebral palsy, unspecified: Secondary | ICD-10-CM | POA: Diagnosis not present

## 2018-07-08 DIAGNOSIS — Z9012 Acquired absence of left breast and nipple: Secondary | ICD-10-CM | POA: Diagnosis not present

## 2018-07-08 DIAGNOSIS — C7951 Secondary malignant neoplasm of bone: Secondary | ICD-10-CM | POA: Diagnosis not present

## 2018-07-08 LAB — CBC WITH DIFFERENTIAL (CANCER CENTER ONLY)
Abs Immature Granulocytes: 0.04 10*3/uL (ref 0.00–0.07)
Basophils Absolute: 0.1 10*3/uL (ref 0.0–0.1)
Basophils Relative: 1 %
Eosinophils Absolute: 0.1 10*3/uL (ref 0.0–0.5)
Eosinophils Relative: 1 %
HCT: 33.1 % — ABNORMAL LOW (ref 36.0–46.0)
Hemoglobin: 10.9 g/dL — ABNORMAL LOW (ref 12.0–15.0)
Immature Granulocytes: 1 %
Lymphocytes Relative: 29 %
Lymphs Abs: 2.2 10*3/uL (ref 0.7–4.0)
MCH: 30.9 pg (ref 26.0–34.0)
MCHC: 32.9 g/dL (ref 30.0–36.0)
MCV: 93.8 fL (ref 80.0–100.0)
MONOS PCT: 5 %
Monocytes Absolute: 0.4 10*3/uL (ref 0.1–1.0)
Neutro Abs: 4.7 10*3/uL (ref 1.7–7.7)
Neutrophils Relative %: 63 %
Platelet Count: 413 10*3/uL — ABNORMAL HIGH (ref 150–400)
RBC: 3.53 MIL/uL — ABNORMAL LOW (ref 3.87–5.11)
RDW: 14.2 % (ref 11.5–15.5)
WBC Count: 7.4 10*3/uL (ref 4.0–10.5)
nRBC: 0 % (ref 0.0–0.2)

## 2018-07-08 LAB — CMP (CANCER CENTER ONLY)
ALT: 25 U/L (ref 0–44)
AST: 16 U/L (ref 15–41)
Albumin: 3.8 g/dL (ref 3.5–5.0)
Alkaline Phosphatase: 105 U/L (ref 38–126)
Anion gap: 7 (ref 5–15)
BUN: 18 mg/dL (ref 6–20)
CHLORIDE: 108 mmol/L (ref 98–111)
CO2: 26 mmol/L (ref 22–32)
CREATININE: 0.68 mg/dL (ref 0.44–1.00)
Calcium: 9.4 mg/dL (ref 8.9–10.3)
GFR, Est AFR Am: >60 mL/min (ref >60)
GFR, Estimated: >60 mL/min (ref >60)
Glucose, Bld: 97 mg/dL (ref 70–99)
POTASSIUM: 3.7 mmol/L (ref 3.5–5.1)
Sodium: 141 mmol/L (ref 135–145)
Total Bilirubin: 0.2 mg/dL — ABNORMAL LOW (ref 0.3–1.2)
Total Protein: 7.6 g/dL (ref 6.5–8.1)

## 2018-07-08 MED ORDER — DEXAMETHASONE SODIUM PHOSPHATE 10 MG/ML IJ SOLN
INTRAMUSCULAR | Status: AC
  Filled 2018-07-08: qty 1

## 2018-07-08 MED ORDER — SODIUM CHLORIDE 0.9% FLUSH
10.0000 mL | INTRAVENOUS | Status: DC | PRN
  Administered 2018-07-08: 10 mL
  Filled 2018-07-08: qty 10

## 2018-07-08 MED ORDER — FAMOTIDINE IN NACL 20-0.9 MG/50ML-% IV SOLN: 20.0000 mg | Freq: Once | INTRAVENOUS | Status: DC

## 2018-07-08 MED ORDER — DEXAMETHASONE SODIUM PHOSPHATE 10 MG/ML IJ SOLN
10.0000 mg | Freq: Once | INTRAMUSCULAR | Status: AC
  Administered 2018-07-08: 10 mg via INTRAVENOUS

## 2018-07-08 MED ORDER — HEPARIN SOD (PORK) LOCK FLUSH 100 UNIT/ML IV SOLN
500.0000 [IU] | Freq: Once | INTRAVENOUS | Status: AC | PRN
  Administered 2018-07-08: 500 [IU]
  Filled 2018-07-08: qty 5

## 2018-07-08 MED ORDER — SODIUM CHLORIDE 0.9 % IV SOLN
60.0000 mg/m2 | Freq: Once | INTRAVENOUS | Status: AC
  Administered 2018-07-08: 96 mg via INTRAVENOUS
  Filled 2018-07-08: qty 16

## 2018-07-08 MED ORDER — DIPHENHYDRAMINE HCL 50 MG/ML IJ SOLN
INTRAMUSCULAR | Status: AC
  Filled 2018-07-08: qty 1

## 2018-07-08 MED ORDER — SODIUM CHLORIDE 0.9 % IV SOLN
20.0000 mg | Freq: Once | INTRAVENOUS | Status: AC
  Administered 2018-07-08: 20 mg via INTRAVENOUS
  Filled 2018-07-08: qty 2

## 2018-07-08 MED ORDER — DIPHENHYDRAMINE HCL 50 MG/ML IJ SOLN
25.0000 mg | Freq: Once | INTRAMUSCULAR | Status: AC
  Administered 2018-07-08: 25 mg via INTRAVENOUS

## 2018-07-08 MED ORDER — SODIUM CHLORIDE 0.9 % IV SOLN
Freq: Once | INTRAVENOUS | Status: AC
  Administered 2018-07-08: 10:00:00 via INTRAVENOUS
  Filled 2018-07-08: qty 250

## 2018-07-08 NOTE — Patient Instructions (Signed)
[facility] Discharge Instructions for Patients Receiving Chemotherapy  Today you received the following chemotherapy agents :  Taxol.  To help prevent nausea and vomiting after your treatment, we encourage you to take your nausea medication as prescribed.   If you develop nausea and vomiting that is not controlled by your nausea medication, call the clinic.   BELOW ARE SYMPTOMS THAT SHOULD BE REPORTED IMMEDIATELY:  *FEVER GREATER THAN 100.5 F  *CHILLS WITH OR WITHOUT FEVER  NAUSEA AND VOMITING THAT IS NOT CONTROLLED WITH YOUR NAUSEA MEDICATION  *UNUSUAL SHORTNESS OF BREATH  *UNUSUAL BRUISING OR BLEEDING  TENDERNESS IN MOUTH AND THROAT WITH OR WITHOUT PRESENCE OF ULCERS  *URINARY PROBLEMS  *BOWEL PROBLEMS  UNUSUAL RASH Items with * indicate a potential emergency and should be followed up as soon as possible.  Feel free to call the clinic should you have any questions or concerns. The clinic phone number is [phone].  Please show the CHEMO ALERT CARD at check-in to the Emergency Department and triage nurse.   

## 2018-07-14 NOTE — Progress Notes (Signed)
Patient Care Team: Janora Norlander, DO as PCP - General (Family Medicine) Danie Binder, MD (Gastroenterology) Alphonsa Overall, MD as Consulting Physician (General Surgery) Nicholas Lose, MD as Consulting Physician (Hematology and Oncology) Kyung Rudd, MD as Consulting Physician (Radiation Oncology)  DIAGNOSIS:    ICD-10-CM   1. Malignant neoplasm of upper-outer quadrant of right breast in female, estrogen receptor positive ([place]) C50.411    Z17.0     SUMMARY OF ONCOLOGIC HISTORY:   Malignant neoplasm of upper-outer quadrant of right breast in female, estrogen receptor positive ([place])   03/18/2018 Initial Diagnosis    Screening detected bilateral breast abnormalities.  Right breast mass UOQ 2.1 cm at 10 o'clock position: Grade 1 ILC ER 70%, PR 0%, HER-2 +3+ by IHC, Ki-67 5%, suspected lymph node in the axilla could not be biopsied; left breast calcifications by ultrasound not visible but 5 cm cystic/tubular structure which on biopsy was fibrocystic change with ADHl; T2N0 stage IIa clinical stage    05/01/2018 Surgery    Bilateral mastectomies: Left mastectomy: DCIS intermediate grade 0.9 cm margins negative Tis NX stage 0; right mastectomy: ILC, grade 3, 3.5 cm, with LCIS, perineural invasion present, margins negative, 15/22 lymph nodes positive with extracapsular extension, ER 90%, PR 0%, HER-2 +(IHC 3+), Ki-67 5%, T2N3 Stage 3B     05/11/2018 Cancer Staging    Staging form: Breast, AJCC 8th Edition - Pathologic stage from 05/11/2018: Stage IIIB (pT2, pN3, cM0, G3, ER+, PR-, HER2+) - Signed by Nicholas Lose, MD on 05/11/2018    06/03/2018 Imaging    Bone scan: Increased tracer uptake in the lumbar spine at L1, L3 and L4, right fourth and seventh ribs, inferior right scapula and distal sternum, T7 and T11 as well.   CT CAP showed widespread bone metastases with possible pathologic fracture at L3, peripheral right upper lobe nodules nonspecific    06/10/2018 -  Chemotherapy   Taxol Kingenti and Perjeta      CHIEF COMPLIANT: Cycle 6 TaxolKingentiand Perjeta  INTERVAL HISTORY: [name] is a 53 y.o. with above-mentioned history of metastatic breast cancerwho is currently on chemotherapy treatment withTaxol,Kingenti, and Perjeta. She presents to the clinic today with her two sisters, one of whom is her legal guardian, for cycle 6 of treatment. She reports hair loss and one episode of dry mouth. She reports occasional diarrhea, no more than 2 loose stools in a day. She denies tingling or numbness in her hands and feet. Her labs from today show: WBC 6.8, Hg 10.5, platelets 362, ANC 4.2.   REVIEW OF SYSTEMS:   Constitutional: Denies fevers, chills or abnormal weight loss (+) hair loss  Eyes: Denies blurriness of vision Ears, nose, mouth, throat, and face: Denies mucositis or sore throat (+) dry mouth Respiratory: Denies cough, dyspnea or wheezes Cardiovascular: Denies palpitation, chest discomfort Gastrointestinal: Denies nausea, heartburn (+) diarrhea Skin: Denies abnormal skin rashes Lymphatics: Denies new lymphadenopathy or easy bruising Neurological: Denies numbness, tingling or new weaknesses Behavioral/Psych: Mood is stable, no new changes  Extremities: No lower extremity edema Breast: denies any pain or lumps or nodules in either breasts All other systems were reviewed with the patient and are negative.  I have reviewed the past medical history, past surgical history, social history and family history with the patient and they are unchanged from previous note.  ALLERGIES:  is allergic to hydrocodone.  MEDICATIONS:  Current Outpatient Medications  Medication Sig Dispense Refill  . ibuprofen (ADVIL,MOTRIN) 600 MG tablet Take 1 tablet (  600 mg total) by mouth every 6 (six) hours as needed (for mild pain not relieved by other medications.). 30 tablet 0  . lisinopril (PRINIVIL,ZESTRIL) 40 MG tablet Take 1 tablet (40 mg total) by mouth daily. 90 tablet 1   . megestrol (MEGACE) 40 MG tablet Take 3 tablets every day (Patient taking differently: Take 60 mg by mouth 2 (two) times daily with a meal. Take 3 tablets every day) 270 tablet 11  . ondansetron (ZOFRAN-ODT) 4 MG disintegrating tablet Take 1 tablet (4 mg total) by mouth every 6 (six) hours as needed for nausea. 15 tablet 0  . pantoprazole (PROTONIX) 40 MG tablet TAKE 1 TABLET BY MOUTH 30 MINUTES PRIOR TO BREAKFAST AND SUPPER (Patient taking differently: Take 40 mg by mouth 2 (two) times daily before a meal. TAKE 1 TABLET BY MOUTH 30 MINUTES PRIOR TO BREAKFAST AND SUPPER) 180 tablet 1  . polyethylene glycol (MIRALAX / GLYCOLAX) packet Take 17 g by mouth daily as needed for moderate constipation.    . pravastatin (PRAVACHOL) 40 MG tablet Take 1 tablet (40 mg total) by mouth daily. (Patient taking differently: Take 40 mg by mouth every evening. ) 90 tablet 3   No current facility-administered medications for this visit.    Facility-Administered Medications Ordered in Other Visits  Medication Dose Route Frequency Provider Last Rate Last Dose  . sodium chloride flush (NS) 0.9 % injection 10 mL  10 mL Intracatheter PRN Nicholas Lose, MD   10 mL at 07/15/18 0755    PHYSICAL EXAMINATION: ECOG PERFORMANCE STATUS: 1 - Symptomatic but completely ambulatory  Vitals:   07/15/18 0817  BP: (!) 166/103  Pulse: 84  Resp: 18  Temp: 98.1 F (36.7 C)  SpO2: 100%   Filed Weights   07/15/18 0817  Weight: 127 lb (57.6 kg)    GENERAL: alert, no distress and comfortable SKIN: skin color, texture, turgor are normal, no rashes or significant lesions EYES: normal, Conjunctiva are pink and non-injected, sclera clear OROPHARYNX: no exudate, no erythema and lips, buccal mucosa, and tongue normal  NECK: supple, thyroid normal size, non-tender, without nodularity LYMPH: no palpable lymphadenopathy in the cervical, axillary or inguinal LUNGS: clear to auscultation and percussion with normal breathing  effort HEART: regular rate & rhythm and no murmurs and no lower extremity edema ABDOMEN: abdomen soft, non-tender and normal bowel sounds MUSCULOSKELETAL: no cyanosis of digits and no clubbing  NEURO: alert & oriented x 3 with fluent speech, no focal motor/sensory deficits EXTREMITIES: No lower extremity edema  LABORATORY DATA:  I have reviewed the data as listed CMP Latest Ref Rng & Units 07/08/2018 07/01/2018 06/24/2018  Glucose 70 - 99 mg/dL 97 99 100(H)  BUN 6 - 20 mg/dL 18 16 13   Creatinine 0.44 - 1.00 mg/dL 0.68 0.68 0.73  Sodium 135 - 145 mmol/L 141 142 142  Potassium 3.5 - 5.1 mmol/L 3.7 3.9 4.1  Chloride 98 - 111 mmol/L 108 108 107  CO2 22 - 32 mmol/L 26 25 27   Calcium 8.9 - 10.3 mg/dL 9.4 9.2 9.2  Total Protein 6.5 - 8.1 g/dL 7.6 7.5 7.5  Total Bilirubin 0.3 - 1.2 mg/dL 0.2(L) 0.3 <0.2(L)  Alkaline Phos 38 - 126 U/L 105 107 106  AST 15 - 41 U/L 16 13(L) 11(L)  ALT 0 - 44 U/L 25 18 16     Lab Results  Component Value Date   WBC 6.8 07/15/2018   HGB 10.5 (L) 07/15/2018   HCT 31.8 (L) 07/15/2018   MCV  92.7 07/15/2018   PLT 362 07/15/2018   NEUTROABS 4.2 07/15/2018    ASSESSMENT & PLAN:  Malignant neoplasm of upper-outer quadrant of right breast in female, estrogen receptor positive ([place]) 05/01/18:Bilateral mastectomies: Left mastectomy: DCIS intermediate grade 0.9 cm margins negative Tis NX stage 0; right mastectomy: ILC, grade 3, 3.5 cm, with LCIS, perineural invasion present, margins negative, 15/22 lymph nodes positive with extracapsular extension, ER 90%, PR 0%, HER-2 +(IHC 3+), Ki-67 5%, T2N3 Stage 3B  06/03/2018 bone scan: Increased tracer uptake in the lumbar spine at L1, L3 and L4, right fourth and seventh ribs, inferior right scapula and distal sternum, T7 and T11 as well.  CT CAPshowed widespread bone metastases with possible pathologic fracture at L3, peripheral right upper lobe nodules nonspecific Patient has cerebral palsy and hersisteris the power of  attorney for healthcare Treatment plan: Palliative treatment with Taxol,Kingenti, Perjeta ------------------------------------------------------------------------------------------------------------------------ Current treatment: Cycle 6 day 1 TaxolKingentiand Perjeta Chemo toxicities: 1.  Rare loose stools 2.  Mild anemia She does not have any nausea or vomiting. Labs reviewed Monitoring closely for toxicities  Return to clinic weekly for chemotherapy and every other week for follow-up with me.  No orders of the defined types were placed in this encounter.  The patient has a good understanding of the overall plan. she agrees with it. she will call with any problems that may develop before the next visit here.  Nicholas Lose, MD 07/15/2018  Julious Oka Dorshimer am acting as scribe for Dr. Nicholas Lose.  I have reviewed the above documentation for accuracy and completeness, and I agree with the above.

## 2018-07-14 NOTE — Assessment & Plan Note (Signed)
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  CT CAPshowed widespread bone metastases with possible pathologic fracture at L3, peripheral right upper lobe nodules nonspecific Patient has cerebral palsy and hersisteris the power of attorney for healthcare Treatment plan: Palliative treatment with Taxol,Kingenti, Perjeta ------------------------------------------------------------------------------------------------------------------------ Current treatment: Cycle 6 day 1 TaxolKingentiand Perjeta Chemo toxicities: 1.  One episode of diarrhea which resolved with Imodium She does not have any nausea or vomiting. Labs reviewed Monitoring closely for toxicities  Return to clinic weekly for chemotherapy and every other week for follow-up with me.

## 2018-07-15 ENCOUNTER — Inpatient Hospital Stay: Payer: Medicare Other | Attending: Hematology and Oncology

## 2018-07-15 ENCOUNTER — Inpatient Hospital Stay (HOSPITAL_BASED_OUTPATIENT_CLINIC_OR_DEPARTMENT_OTHER): Payer: Medicare Other | Admitting: Hematology and Oncology

## 2018-07-15 ENCOUNTER — Inpatient Hospital Stay: Payer: Medicare Other

## 2018-07-15 DIAGNOSIS — Z170 Estrogen receptor positive status [ER+]: Secondary | ICD-10-CM

## 2018-07-15 DIAGNOSIS — Z5112 Encounter for antineoplastic immunotherapy: Secondary | ICD-10-CM

## 2018-07-15 DIAGNOSIS — Z5111 Encounter for antineoplastic chemotherapy: Secondary | ICD-10-CM | POA: Diagnosis not present

## 2018-07-15 DIAGNOSIS — Z79899 Other long term (current) drug therapy: Secondary | ICD-10-CM | POA: Diagnosis not present

## 2018-07-15 DIAGNOSIS — R197 Diarrhea, unspecified: Secondary | ICD-10-CM

## 2018-07-15 DIAGNOSIS — G809 Cerebral palsy, unspecified: Secondary | ICD-10-CM

## 2018-07-15 DIAGNOSIS — Z95828 Presence of other vascular implants and grafts: Secondary | ICD-10-CM

## 2018-07-15 DIAGNOSIS — R682 Dry mouth, unspecified: Secondary | ICD-10-CM | POA: Diagnosis not present

## 2018-07-15 DIAGNOSIS — I10 Essential (primary) hypertension: Secondary | ICD-10-CM | POA: Diagnosis not present

## 2018-07-15 DIAGNOSIS — K219 Gastro-esophageal reflux disease without esophagitis: Secondary | ICD-10-CM | POA: Insufficient documentation

## 2018-07-15 DIAGNOSIS — C7951 Secondary malignant neoplasm of bone: Secondary | ICD-10-CM

## 2018-07-15 DIAGNOSIS — C50411 Malignant neoplasm of upper-outer quadrant of right female breast: Secondary | ICD-10-CM | POA: Diagnosis not present

## 2018-07-15 DIAGNOSIS — E785 Hyperlipidemia, unspecified: Secondary | ICD-10-CM | POA: Diagnosis not present

## 2018-07-15 DIAGNOSIS — Z9013 Acquired absence of bilateral breasts and nipples: Secondary | ICD-10-CM | POA: Diagnosis not present

## 2018-07-15 DIAGNOSIS — E876 Hypokalemia: Secondary | ICD-10-CM

## 2018-07-15 LAB — CBC WITH DIFFERENTIAL (CANCER CENTER ONLY)
Abs Immature Granulocytes: 0.04 10*3/uL (ref 0.00–0.07)
Basophils Absolute: 0 10*3/uL (ref 0.0–0.1)
Basophils Relative: 1 %
EOS PCT: 2 %
Eosinophils Absolute: 0.1 10*3/uL (ref 0.0–0.5)
HCT: 31.8 % — ABNORMAL LOW (ref 36.0–46.0)
Hemoglobin: 10.5 g/dL — ABNORMAL LOW (ref 12.0–15.0)
Immature Granulocytes: 1 %
Lymphocytes Relative: 30 %
Lymphs Abs: 2.1 10*3/uL (ref 0.7–4.0)
MCH: 30.6 pg (ref 26.0–34.0)
MCHC: 33 g/dL (ref 30.0–36.0)
MCV: 92.7 fL (ref 80.0–100.0)
Monocytes Absolute: 0.4 10*3/uL (ref 0.1–1.0)
Monocytes Relative: 6 %
NRBC: 0 % (ref 0.0–0.2)
Neutro Abs: 4.2 10*3/uL (ref 1.7–7.7)
Neutrophils Relative %: 60 %
Platelet Count: 362 10*3/uL (ref 150–400)
RBC: 3.43 MIL/uL — ABNORMAL LOW (ref 3.87–5.11)
RDW: 14.5 % (ref 11.5–15.5)
WBC Count: 6.8 10*3/uL (ref 4.0–10.5)

## 2018-07-15 LAB — CMP (CANCER CENTER ONLY)
ALBUMIN: 3.5 g/dL (ref 3.5–5.0)
ALT: 29 U/L (ref 0–44)
AST: 13 U/L — AB (ref 15–41)
Alkaline Phosphatase: 105 U/L (ref 38–126)
Anion gap: 10 (ref 5–15)
BUN: 13 mg/dL (ref 6–20)
CO2: 25 mmol/L (ref 22–32)
Calcium: 9 mg/dL (ref 8.9–10.3)
Chloride: 109 mmol/L (ref 98–111)
Creatinine: 0.65 mg/dL (ref 0.44–1.00)
GFR, Est AFR Am: >60 mL/min (ref >60)
GFR, Estimated: >60 mL/min (ref >60)
Glucose, Bld: 101 mg/dL — ABNORMAL HIGH (ref 70–99)
Potassium: 3.1 mmol/L — ABNORMAL LOW (ref 3.5–5.1)
Sodium: 144 mmol/L (ref 135–145)
Total Bilirubin: 0.2 mg/dL — ABNORMAL LOW (ref 0.3–1.2)
Total Protein: 7.2 g/dL (ref 6.5–8.1)

## 2018-07-15 MED ORDER — SODIUM CHLORIDE 0.9 % IV SOLN
60.0000 mg/m2 | Freq: Once | INTRAVENOUS | Status: AC
  Administered 2018-07-15: 96 mg via INTRAVENOUS
  Filled 2018-07-15: qty 16

## 2018-07-15 MED ORDER — POTASSIUM CHLORIDE ER 10 MEQ PO TBCR: 10.0000 meq | EXTENDED_RELEASE_TABLET | Freq: Every day | ORAL | 0 refills | Status: DC

## 2018-07-15 MED ORDER — SODIUM CHLORIDE 0.9% FLUSH
10.0000 mL | INTRAVENOUS | Status: DC | PRN
  Administered 2018-07-15: 10 mL
  Filled 2018-07-15: qty 10

## 2018-07-15 MED ORDER — POTASSIUM CHLORIDE CRYS ER 20 MEQ PO TBCR
40.0000 meq | EXTENDED_RELEASE_TABLET | Freq: Once | ORAL | Status: AC
  Administered 2018-07-15: 40 meq via ORAL

## 2018-07-15 MED ORDER — DEXAMETHASONE SODIUM PHOSPHATE 10 MG/ML IJ SOLN
10.0000 mg | Freq: Once | INTRAMUSCULAR | Status: AC
  Administered 2018-07-15: 10 mg via INTRAVENOUS

## 2018-07-15 MED ORDER — HEPARIN SOD (PORK) LOCK FLUSH 100 UNIT/ML IV SOLN
500.0000 [IU] | Freq: Once | INTRAVENOUS | Status: AC | PRN
  Administered 2018-07-15: 500 [IU]
  Filled 2018-07-15: qty 5

## 2018-07-15 MED ORDER — DIPHENHYDRAMINE HCL 50 MG/ML IJ SOLN
INTRAMUSCULAR | Status: AC
  Filled 2018-07-15: qty 1

## 2018-07-15 MED ORDER — DIPHENHYDRAMINE HCL 50 MG/ML IJ SOLN
25.0000 mg | Freq: Once | INTRAMUSCULAR | Status: AC
  Administered 2018-07-15: 25 mg via INTRAVENOUS

## 2018-07-15 MED ORDER — SODIUM CHLORIDE 0.9 % IV SOLN
20.0000 mg | Freq: Once | INTRAVENOUS | Status: AC
  Administered 2018-07-15: 20 mg via INTRAVENOUS
  Filled 2018-07-15: qty 2

## 2018-07-15 MED ORDER — SODIUM CHLORIDE 0.9 % IV SOLN
Freq: Once | INTRAVENOUS | Status: AC
  Administered 2018-07-15: 09:00:00 via INTRAVENOUS
  Filled 2018-07-15: qty 250

## 2018-07-15 MED ORDER — POTASSIUM CHLORIDE CRYS ER 20 MEQ PO TBCR
EXTENDED_RELEASE_TABLET | ORAL | Status: AC
  Filled 2018-07-15: qty 2

## 2018-07-15 MED ORDER — DEXAMETHASONE SODIUM PHOSPHATE 10 MG/ML IJ SOLN
INTRAMUSCULAR | Status: AC
  Filled 2018-07-15: qty 1

## 2018-07-15 MED ORDER — FAMOTIDINE IN NACL 20-0.9 MG/50ML-% IV SOLN: 20.0000 mg | Freq: Once | INTRAVENOUS | Status: DC

## 2018-07-15 NOTE — Patient Instructions (Signed)
[facility] Discharge Instructions for Patients Receiving Chemotherapy  Today you received the following chemotherapy agents :  Taxol.  To help prevent nausea and vomiting after your treatment, we encourage you to take your nausea medication as prescribed.   If you develop nausea and vomiting that is not controlled by your nausea medication, call the clinic.   BELOW ARE SYMPTOMS THAT SHOULD BE REPORTED IMMEDIATELY:  *FEVER GREATER THAN 100.5 F  *CHILLS WITH OR WITHOUT FEVER  NAUSEA AND VOMITING THAT IS NOT CONTROLLED WITH YOUR NAUSEA MEDICATION  *UNUSUAL SHORTNESS OF BREATH  *UNUSUAL BRUISING OR BLEEDING  TENDERNESS IN MOUTH AND THROAT WITH OR WITHOUT PRESENCE OF ULCERS  *URINARY PROBLEMS  *BOWEL PROBLEMS  UNUSUAL RASH Items with * indicate a potential emergency and should be followed up as soon as possible.  Feel free to call the clinic should you have any questions or concerns. The clinic phone number is [phone].  Please show the CHEMO ALERT CARD at check-in to the Emergency Department and triage nurse.   

## 2018-07-15 NOTE — Progress Notes (Signed)
Pt potassium 3.1. Per Dr.Gudena, ok to give 64meq po potassium today with chemo. Pt HR 101 reported. Pt asymptomatic at this time. Okay to proceed with chemo per MD.

## 2018-07-20 ENCOUNTER — Other Ambulatory Visit: Payer: Self-pay | Admitting: Adult Health

## 2018-07-20 DIAGNOSIS — C50411 Malignant neoplasm of upper-outer quadrant of right female breast: Secondary | ICD-10-CM

## 2018-07-20 DIAGNOSIS — Z170 Estrogen receptor positive status [ER+]: Principal | ICD-10-CM

## 2018-07-22 ENCOUNTER — Inpatient Hospital Stay (HOSPITAL_BASED_OUTPATIENT_CLINIC_OR_DEPARTMENT_OTHER): Payer: Medicare Other | Admitting: Adult Health

## 2018-07-22 ENCOUNTER — Other Ambulatory Visit: Payer: Self-pay

## 2018-07-22 ENCOUNTER — Inpatient Hospital Stay: Payer: Medicare Other

## 2018-07-22 ENCOUNTER — Encounter: Payer: Self-pay | Admitting: Adult Health

## 2018-07-22 VITALS — BP 170/88 | HR 95 | Temp 98.2°F | Resp 18

## 2018-07-22 VITALS — BP 154/100 | HR 83 | Temp 98.4°F | Resp 17 | Ht 62.0 in | Wt 120.8 lb

## 2018-07-22 DIAGNOSIS — R197 Diarrhea, unspecified: Secondary | ICD-10-CM | POA: Diagnosis not present

## 2018-07-22 DIAGNOSIS — I10 Essential (primary) hypertension: Secondary | ICD-10-CM | POA: Diagnosis not present

## 2018-07-22 DIAGNOSIS — C7951 Secondary malignant neoplasm of bone: Secondary | ICD-10-CM | POA: Diagnosis not present

## 2018-07-22 DIAGNOSIS — Z79899 Other long term (current) drug therapy: Secondary | ICD-10-CM | POA: Diagnosis not present

## 2018-07-22 DIAGNOSIS — Z95828 Presence of other vascular implants and grafts: Secondary | ICD-10-CM

## 2018-07-22 DIAGNOSIS — K219 Gastro-esophageal reflux disease without esophagitis: Secondary | ICD-10-CM

## 2018-07-22 DIAGNOSIS — Z9013 Acquired absence of bilateral breasts and nipples: Secondary | ICD-10-CM

## 2018-07-22 DIAGNOSIS — Z170 Estrogen receptor positive status [ER+]: Secondary | ICD-10-CM | POA: Diagnosis not present

## 2018-07-22 DIAGNOSIS — C50411 Malignant neoplasm of upper-outer quadrant of right female breast: Secondary | ICD-10-CM

## 2018-07-22 DIAGNOSIS — Z5111 Encounter for antineoplastic chemotherapy: Secondary | ICD-10-CM

## 2018-07-22 DIAGNOSIS — E785 Hyperlipidemia, unspecified: Secondary | ICD-10-CM

## 2018-07-22 DIAGNOSIS — G809 Cerebral palsy, unspecified: Secondary | ICD-10-CM

## 2018-07-22 DIAGNOSIS — R682 Dry mouth, unspecified: Secondary | ICD-10-CM | POA: Diagnosis not present

## 2018-07-22 DIAGNOSIS — Z5112 Encounter for antineoplastic immunotherapy: Secondary | ICD-10-CM

## 2018-07-22 LAB — CBC WITH DIFFERENTIAL (CANCER CENTER ONLY)
Abs Immature Granulocytes: 0.07 10*3/uL (ref 0.00–0.07)
Basophils Absolute: 0.1 10*3/uL (ref 0.0–0.1)
Basophils Relative: 1 %
EOS ABS: 0.1 10*3/uL (ref 0.0–0.5)
Eosinophils Relative: 2 %
HCT: 33.7 % — ABNORMAL LOW (ref 36.0–46.0)
Hemoglobin: 10.8 g/dL — ABNORMAL LOW (ref 12.0–15.0)
Immature Granulocytes: 1 %
Lymphocytes Relative: 26 %
Lymphs Abs: 2.1 10*3/uL (ref 0.7–4.0)
MCH: 30.6 pg (ref 26.0–34.0)
MCHC: 32 g/dL (ref 30.0–36.0)
MCV: 95.5 fL (ref 80.0–100.0)
Monocytes Absolute: 0.5 10*3/uL (ref 0.1–1.0)
Monocytes Relative: 7 %
Neutro Abs: 5.2 10*3/uL (ref 1.7–7.7)
Neutrophils Relative %: 63 %
Platelet Count: 399 10*3/uL (ref 150–400)
RBC: 3.53 MIL/uL — ABNORMAL LOW (ref 3.87–5.11)
RDW: 14.7 % (ref 11.5–15.5)
WBC Count: 8.1 10*3/uL (ref 4.0–10.5)
nRBC: 0 % (ref 0.0–0.2)

## 2018-07-22 LAB — CMP (CANCER CENTER ONLY)
ALK PHOS: 105 U/L (ref 38–126)
ALT: 21 U/L (ref 0–44)
AST: 14 U/L — ABNORMAL LOW (ref 15–41)
Albumin: 3.8 g/dL (ref 3.5–5.0)
Anion gap: 10 (ref 5–15)
BUN: 15 mg/dL (ref 6–20)
CO2: 24 mmol/L (ref 22–32)
Calcium: 9.3 mg/dL (ref 8.9–10.3)
Chloride: 109 mmol/L (ref 98–111)
Creatinine: 0.7 mg/dL (ref 0.44–1.00)
GFR, Est AFR Am: >60 mL/min (ref >60)
GFR, Estimated: >60 mL/min (ref >60)
Glucose, Bld: 111 mg/dL — ABNORMAL HIGH (ref 70–99)
Potassium: 3.7 mmol/L (ref 3.5–5.1)
Sodium: 143 mmol/L (ref 135–145)
TOTAL PROTEIN: 7.7 g/dL (ref 6.5–8.1)
Total Bilirubin: 0.3 mg/dL (ref 0.3–1.2)

## 2018-07-22 MED ORDER — TRASTUZUMAB CHEMO 150 MG IV SOLR
6.0000 mg/kg | Freq: Once | INTRAVENOUS | Status: AC
  Administered 2018-07-22: 336 mg via INTRAVENOUS
  Filled 2018-07-22: qty 16

## 2018-07-22 MED ORDER — SODIUM CHLORIDE 0.9 % IV SOLN
20.0000 mg | Freq: Once | INTRAVENOUS | Status: AC
  Administered 2018-07-22: 20 mg via INTRAVENOUS
  Filled 2018-07-22: qty 2

## 2018-07-22 MED ORDER — SODIUM CHLORIDE 0.9 % IV SOLN
420.0000 mg | Freq: Once | INTRAVENOUS | Status: AC
  Administered 2018-07-22: 420 mg via INTRAVENOUS
  Filled 2018-07-22: qty 14

## 2018-07-22 MED ORDER — DIPHENHYDRAMINE HCL 50 MG/ML IJ SOLN
INTRAMUSCULAR | Status: AC
  Filled 2018-07-22: qty 1

## 2018-07-22 MED ORDER — DEXAMETHASONE SODIUM PHOSPHATE 10 MG/ML IJ SOLN
INTRAMUSCULAR | Status: AC
  Filled 2018-07-22: qty 1

## 2018-07-22 MED ORDER — ACETAMINOPHEN 325 MG PO TABS
650.0000 mg | ORAL_TABLET | Freq: Once | ORAL | Status: AC
  Administered 2018-07-22: 650 mg via ORAL

## 2018-07-22 MED ORDER — DEXAMETHASONE SODIUM PHOSPHATE 10 MG/ML IJ SOLN
10.0000 mg | Freq: Once | INTRAMUSCULAR | Status: AC
  Administered 2018-07-22: 10 mg via INTRAVENOUS

## 2018-07-22 MED ORDER — SODIUM CHLORIDE 0.9% FLUSH
10.0000 mL | INTRAVENOUS | Status: DC | PRN
  Administered 2018-07-22: 10 mL
  Filled 2018-07-22: qty 10

## 2018-07-22 MED ORDER — DIPHENOXYLATE-ATROPINE 2.5-0.025 MG PO TABS
ORAL_TABLET | ORAL | Status: AC
  Filled 2018-07-22: qty 1

## 2018-07-22 MED ORDER — DIPHENHYDRAMINE HCL 50 MG/ML IJ SOLN
25.0000 mg | Freq: Once | INTRAMUSCULAR | Status: AC
  Administered 2018-07-22: 25 mg via INTRAVENOUS

## 2018-07-22 MED ORDER — SODIUM CHLORIDE 0.9 % IV SOLN
Freq: Once | INTRAVENOUS | Status: AC
  Administered 2018-07-22: 10:00:00 via INTRAVENOUS
  Filled 2018-07-22: qty 250

## 2018-07-22 MED ORDER — HEPARIN SOD (PORK) LOCK FLUSH 100 UNIT/ML IV SOLN
500.0000 [IU] | Freq: Once | INTRAVENOUS | Status: AC | PRN
  Administered 2018-07-22: 500 [IU]
  Filled 2018-07-22: qty 5

## 2018-07-22 MED ORDER — DIPHENOXYLATE-ATROPINE 2.5-0.025 MG PO TABS
1.0000 | ORAL_TABLET | ORAL | Status: DC | PRN
  Administered 2018-07-22: 1 via ORAL

## 2018-07-22 MED ORDER — SODIUM CHLORIDE 0.9 % IV SOLN
60.0000 mg/m2 | Freq: Once | INTRAVENOUS | Status: AC
  Administered 2018-07-22: 96 mg via INTRAVENOUS
  Filled 2018-07-22: qty 16

## 2018-07-22 MED ORDER — ACETAMINOPHEN 325 MG PO TABS
ORAL_TABLET | ORAL | Status: AC
  Filled 2018-07-22: qty 2

## 2018-07-22 MED ORDER — FAMOTIDINE IN NACL 20-0.9 MG/50ML-% IV SOLN: 20.0000 mg | Freq: Once | INTRAVENOUS | Status: DC

## 2018-07-22 NOTE — Assessment & Plan Note (Addendum)
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  CT CAPshowed widespread bone metastases with possible pathologic fracture at L3, peripheral right upper lobe nodules nonspecific Patient has cerebral palsy and hersisteris the power of attorney for healthcare Treatment plan: Palliative treatment with Taxol,Kingenti, Perjeta ------------------------------------------------------------------------------------------------------------------------ Current treatment: Cycle 8 day 1 TaxolKingentiand Perjeta Echo 04/01/2018 shows EF of 55-60%. Chemo toxicities: 1. Diarrhea--resolved with imodium  She is tolerating treatment well.  Will continue to monitor for any other toxicities, especially neuropathy.  She will have echo tomorrow.  CBC stable, reviewed with patient and sister Dewaine Oats.   Return to clinic weekly for chemotherapy and follow up.

## 2018-07-22 NOTE — Patient Instructions (Signed)
[facility] Discharge Instructions for Patients Receiving Chemotherapy  Today you received the following chemotherapy agents : Herceptin, Perjeta, Taxol.  To help prevent nausea and vomiting after your treatment, we encourage you to take your nausea medication as prescribed.   If you develop nausea and vomiting that is not controlled by your nausea medication, call the clinic.   BELOW ARE SYMPTOMS THAT SHOULD BE REPORTED IMMEDIATELY:  *FEVER GREATER THAN 100.5 F  *CHILLS WITH OR WITHOUT FEVER  NAUSEA AND VOMITING THAT IS NOT CONTROLLED WITH YOUR NAUSEA MEDICATION  *UNUSUAL SHORTNESS OF BREATH  *UNUSUAL BRUISING OR BLEEDING  TENDERNESS IN MOUTH AND THROAT WITH OR WITHOUT PRESENCE OF ULCERS  *URINARY PROBLEMS  *BOWEL PROBLEMS  UNUSUAL RASH Items with * indicate a potential emergency and should be followed up as soon as possible.  Feel free to call the clinic should you have any questions or concerns. The clinic phone number is [phone].  Please show the CHEMO ALERT CARD at check-in to the Emergency Department and triage nurse.   

## 2018-07-22 NOTE — Progress Notes (Signed)
[name]:    Sherri Norlander, DO [place] [address]   DIAGNOSIS: Cancer Staging Malignant neoplasm of upper-outer quadrant of right breast in female, estrogen receptor positive ([place]) Staging form: Breast, AJCC 8th Edition - Clinical: Stage IIA (cT2, cN0, cM0, G1, ER+, PR-, HER2+) - Unsigned - Pathologic stage from 05/11/2018: Stage IIIB (pT2, pN3, cM0, G3, ER+, PR-, HER2+) - Signed by Nicholas Lose, MD on 05/11/2018   SUMMARY OF ONCOLOGIC HISTORY:   Malignant neoplasm of upper-outer quadrant of right breast in female, estrogen receptor positive ([place])   03/18/2018 Initial Diagnosis    Screening detected bilateral breast abnormalities.  Right breast mass UOQ 2.1 cm at 10 o'clock position: Grade 1 ILC ER 70%, PR 0%, HER-2 +3+ by IHC, Ki-67 5%, suspected lymph node in the axilla could not be biopsied; left breast calcifications by ultrasound not visible but 5 cm cystic/tubular structure which on biopsy was fibrocystic change with ADHl; T2N0 stage IIa clinical stage    05/01/2018 Surgery    Bilateral mastectomies: Left mastectomy: DCIS intermediate grade 0.9 cm margins negative Tis NX stage 0; right mastectomy: ILC, grade 3, 3.5 cm, with LCIS, perineural invasion present, margins negative, 15/22 lymph nodes positive with extracapsular extension, ER 90%, PR 0%, HER-2 +(IHC 3+), Ki-67 5%, T2N3 Stage 3B     05/11/2018 Cancer Staging    Staging form: Breast, AJCC 8th Edition - Pathologic stage from 05/11/2018: Stage IIIB (pT2, pN3, cM0, G3, ER+, PR-, HER2+) - Signed by Nicholas Lose, MD on 05/11/2018    06/03/2018 Imaging    Bone scan: Increased tracer uptake in the lumbar spine at L1, L3 and L4, right fourth and seventh ribs, inferior right scapula and distal sternum, T7 and T11 as well.   CT CAP showed widespread bone metastases with possible pathologic fracture at L3, peripheral right upper lobe nodules nonspecific    06/10/2018 -   Chemotherapy    Taxol Kingenti and Perjeta      CURRENT THERAPY: Taxol, Kingenti, and Perjeta  INTERVAL HISTORY: Sherri GILLES "Rennette" a 53 y.o. female returns for evaluation prior to receiving her weekly adjuvant treatment.  She is accompanied by her She is receiving Taxol weekly and Kingenti Perjeta once every three weeks.  She is due for all three today.  She is due for an echocardiogram tomorrow.  [name] lives at home and her sister lives with her.  She is doing well and continues to tolerate treatment well.  She has not had any peripheral neuropathy.     Patient Active Problem List   Diagnosis Date Noted  . Port-A-Cath in place 06/17/2018  . Bone metastases ([place]) 06/04/2018  . Breast cancer, stage 2, right ([place]) 05/01/2018  . Malignant neoplasm of upper-outer quadrant of right breast in female, estrogen receptor positive ([place]) 03/23/2018  . Pre-diabetes 12/24/2016  . Healthcare maintenance 12/26/2014  . Headache 12/26/2014  . Frequency of micturition 09/03/2013  . Need for Tdap vaccination 09/03/2013  . Pain in limb 04/12/2013  . Wart viral 04/12/2013  . HLD (hyperlipidemia) 09/16/2012  . Vitamin D deficiency 09/16/2012  . Hyperglycemia 09/16/2012  . HTN (hypertension) 09/16/2012  . 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 DENTAL PAIN 07/11/2009  . Infantile cerebral palsy ([place]) 11/23/2008  . GERD 11/23/2008  . Constipation 11/23/2008  . Dysphagia 11/23/2008    is allergic to hydrocodone.  MEDICAL HISTORY: Past Medical History:  Diagnosis Date  . Arthritis   . Cerebral palsy (  [place])   . Complication of anesthesia 12/09/2013   Respiratory instability with 10 mg Propofol.Poor gag reflex.  . Constipation - functional   . Dysphagia 2003   2O to GERD/HH, ?difficulty with large food bolus due to neuromuscular weakness  . Epigastric pain MAY 2012 ABD [place] NL GBLIVPAN   2o to GERD V. NON-ULCER DYSPEPSIA  . GERD (gastroesophageal reflux disease) 2007   BPE NL ESO, REFLUX   . Hiatal hernia   . HIATAL HERNIA 11/23/2008   Qualifier: Diagnosis of  By: Zeb Comfort    . HTN (hypertension)   . Hyperlipidemia     SURGICAL HISTORY: Past Surgical History:  Procedure Laterality Date  . COLONOSCOPY  APR 2009 with proprofol   SLF: SIMPLE ADENOMAS  . MASTECTOMY W/ SENTINEL NODE BIOPSY Bilateral 05/01/2018   Procedure: BILATERAL MASTECTOMIES WITH LEFT SENTINEL LYMPH NODE BIOPSIES AND RIGHT AXILLARY LYMPH NODE DISSECTION;  Surgeon: Alphonsa Overall, MD;  Location: [facility];  Service: General;  Laterality: Bilateral;  . PORTACATH PLACEMENT N/A 05/01/2018   Procedure: INSERTION PORT-A-CATH WITH [place];  Surgeon: Alphonsa Overall, MD;  Location: [facility];  Service: General;  Laterality: N/A;  . UPPER GASTROINTESTINAL ENDOSCOPY  MAY 2012 DIL 16 mm   SLF: NO DEFINITE STRICTURE APPRECIATED  . UPPER GASTROINTESTINAL ENDOSCOPY  FEB 2009-HYPOXIA REQUIRING NARCAN, & VERSED, D50V4   SLF: NL ESO, FUNDIC GLAND POLYPS  . UPPER GASTROINTESTINAL ENDOSCOPY  w/ DIL 2003, 2004, 2006    SOCIAL HISTORY: Social History   Socioeconomic History  . Marital status: Single    Spouse name: Not on file  . Number of children: Not on file  . Years of education: Not on file  . Highest education level: Not on file  Occupational History  . Not on file  Social Needs  . Financial resource strain: Not on file  . Food insecurity:    Worry: Not on file    Inability: Not on file  . Transportation needs:    Medical: Not on file    Non-medical: Not on file  Tobacco Use  . Smoking status: Never Smoker  . Smokeless tobacco: Never Used  Substance and Sexual Activity  . Alcohol use: No  . Drug use: No  . Sexual activity: Not Currently    Birth control/protection: None  Lifestyle  . Physical activity:    Days per week: Not on file    Minutes per session: Not on file  . Stress: Not on file  Relationships  . Social connections:    Talks on phone: Not on file    Gets together: Not on file    Attends  religious service: Not on file    Active member of club or organization: Not on file    Attends meetings of clubs or organizations: Not on file    Relationship status: Not on file  . Intimate partner violence:    Fear of current or ex partner: Not on file    Emotionally abused: Not on file    Physically abused: Not on file    Forced sexual activity: Not on file  Other Topics Concern  . Not on file  Social History Narrative  . Not on file    FAMILY HISTORY: Family History  Problem Relation Age of Onset  . Colon cancer Mother        LATE 50S    Review of Systems  Constitutional: Negative for appetite change, chills, fatigue, fever and unexpected weight change.  HENT:   Negative for  hearing loss, lump/mass, mouth sores and trouble swallowing.   Eyes: Negative for eye problems and icterus.  Respiratory: Negative for chest tightness, cough and shortness of breath.   Cardiovascular: Negative for chest pain, leg swelling and palpitations.  Gastrointestinal: Negative for abdominal distention and abdominal pain.  Endocrine: Negative for hot flashes.  Genitourinary: Negative for difficulty urinating.   Musculoskeletal: Negative for arthralgias.  Skin: Negative for itching and rash.  Neurological: Negative for dizziness, extremity weakness, headaches and numbness.  Hematological: Negative for adenopathy. Does not bruise/bleed easily.  Psychiatric/Behavioral: Negative for depression. The patient is not nervous/anxious.       PHYSICAL EXAMINATION  ECOG PERFORMANCE STATUS: 2 - Symptomatic, <50% confined to bed  Vitals:   07/22/18 0902  BP: (!) 154/100  Pulse: 83  Resp: 17  Temp: 98.4 F (36.9 C)  SpO2: 98%    Physical Exam Constitutional:      General: She is not in acute distress.    Appearance: Normal appearance.  HENT:     Head: Normocephalic and atraumatic.     Mouth/Throat:     Mouth: Mucous membranes are moist.     Pharynx: Oropharynx is clear. No oropharyngeal  exudate.  Eyes:     Pupils: Pupils are equal, round, and reactive to light.  Neck:     Musculoskeletal: Neck supple.  Cardiovascular:     Rate and Rhythm: Normal rate and regular rhythm.     Pulses: Normal pulses.     Heart sounds: Normal heart sounds.  Pulmonary:     Effort: Pulmonary effort is normal.     Breath sounds: Normal breath sounds.  Abdominal:     General: Abdomen is flat. Bowel sounds are normal. There is no distension.     Tenderness: There is no abdominal tenderness.  Musculoskeletal:        General: No swelling.  Lymphadenopathy:     Cervical: No cervical adenopathy.  Skin:    General: Skin is warm and dry.     Capillary Refill: Capillary refill takes less than 2 seconds.     Findings: No rash.  Neurological:     General: No focal deficit present.     Mental Status: She is alert and oriented to person, place, and time.  Psychiatric:        Mood and Affect: Mood normal.        Behavior: Behavior normal.     LABORATORY DATA:  CBC    Component Value Date/Time   WBC 8.1 07/22/2018 0828   WBC 7.8 04/21/2018 1333   RBC 3.53 (L) 07/22/2018 0828   HGB 10.8 (L) 07/22/2018 0828   HGB 12.8 06/09/2017 1137   HCT 33.7 (L) 07/22/2018 0828   HCT 39.2 06/09/2017 1137   PLT 399 07/22/2018 0828   PLT 312 06/09/2017 1137   MCV 95.5 07/22/2018 0828   MCV 92 06/09/2017 1137   MCH 30.6 07/22/2018 0828   MCHC 32.0 07/22/2018 0828   RDW 14.7 07/22/2018 0828   RDW 14.2 06/09/2017 1137   LYMPHSABS 2.1 07/22/2018 0828   LYMPHSABS 2.3 06/09/2017 1137   MONOABS 0.5 07/22/2018 0828   EOSABS 0.1 07/22/2018 0828   EOSABS 0.1 06/09/2017 1137   BASOSABS 0.1 07/22/2018 0828   BASOSABS 0.0 06/09/2017 1137    CMP     Component Value Date/Time   NA 143 07/22/2018 0828   NA 145 (H) 10/07/2017 0831   K 3.7 07/22/2018 0828   CL 109 07/22/2018 0828   CO2  24 07/22/2018 0828   GLUCOSE 111 (H) 07/22/2018 0828   BUN 15 07/22/2018 0828   BUN 12 10/07/2017 0831   CREATININE  0.70 07/22/2018 0828   CREATININE 0.54 09/16/2012 1230   CALCIUM 9.3 07/22/2018 0828   PROT 7.7 07/22/2018 0828   PROT 7.2 06/09/2017 1137   ALBUMIN 3.8 07/22/2018 0828   ALBUMIN 4.2 06/09/2017 1137   AST 14 (L) 07/22/2018 0828   ALT 21 07/22/2018 0828   ALKPHOS 105 07/22/2018 0828   BILITOT 0.3 07/22/2018 0828   GFRNONAA >60 07/22/2018 0828   GFRNONAA >89 09/16/2012 1230   GFRAA >60 07/22/2018 0828   GFRAA >89 09/16/2012 1230            ASSESSMENT and THERAPY PLAN:   Malignant neoplasm of upper-outer quadrant of right breast in female, estrogen receptor positive ([place]) 05/01/18:Bilateral mastectomies: Left mastectomy: DCIS intermediate grade 0.9 cm margins negative Tis NX stage 0; right mastectomy: ILC, grade 3, 3.5 cm, with LCIS, perineural invasion present, margins negative, 15/22 lymph nodes positive with extracapsular extension, ER 90%, PR 0%, HER-2 +(IHC 3+), Ki-67 5%, T2N3 Stage 3B  06/03/2018 bone scan: Increased tracer uptake in the lumbar spine at L1, L3 and L4, right fourth and seventh ribs, inferior right scapula and distal sternum, T7 and T11 as well.  CT CAPshowed widespread bone metastases with possible pathologic fracture at L3, peripheral right upper lobe nodules nonspecific Patient has cerebral palsy and hersisteris the power of attorney for healthcare Treatment plan: Palliative treatment with Taxol,Kingenti, Perjeta ------------------------------------------------------------------------------------------------------------------------ Current treatment: Cycle 8 day 1 TaxolKingentiand Perjeta Echo 04/01/2018 shows EF of 55-60%. Chemo toxicities: 1. Diarrhea--resolved with imodium  She is tolerating treatment well.  Will continue to monitor for any other toxicities, especially neuropathy.  She will have echo tomorrow.  CBC stable, reviewed with patient and sister [name].   Return to clinic weekly for chemotherapy and follow up.   All questions  were answered. The patient knows to call the clinic with any problems, questions or concerns. We can certainly see the patient much sooner if necessary.  A total of (20) minutes of face-to-face time was spent with this patient with greater than 50% of that time in counseling and care-coordination.  This note was electronically signed. Scot Dock, NP 07/22/2018

## 2018-07-24 ENCOUNTER — Ambulatory Visit (HOSPITAL_COMMUNITY)
Admission: RE | Admit: 2018-07-24 | Discharge: 2018-07-24 | Disposition: A | Discharge status: Home / Self Care | Payer: Medicare Other | Source: Ambulatory Visit | Attending: Adult Health | Admitting: Adult Health

## 2018-07-24 ENCOUNTER — Other Ambulatory Visit: Payer: Self-pay

## 2018-07-24 DIAGNOSIS — I10 Essential (primary) hypertension: Secondary | ICD-10-CM | POA: Insufficient documentation

## 2018-07-24 DIAGNOSIS — C50411 Malignant neoplasm of upper-outer quadrant of right female breast: Secondary | ICD-10-CM | POA: Insufficient documentation

## 2018-07-24 DIAGNOSIS — I313 Pericardial effusion (noninflammatory): Secondary | ICD-10-CM | POA: Diagnosis not present

## 2018-07-24 DIAGNOSIS — E785 Hyperlipidemia, unspecified: Secondary | ICD-10-CM | POA: Diagnosis not present

## 2018-07-24 DIAGNOSIS — Z170 Estrogen receptor positive status [ER+]: Secondary | ICD-10-CM | POA: Insufficient documentation

## 2018-07-28 NOTE — Progress Notes (Signed)
Patient Care Team: Janora Norlander, DO as PCP - General (Family Medicine) Danie Binder, MD (Gastroenterology) Alphonsa Overall, MD as Consulting Physician (General Surgery) Nicholas Lose, MD as Consulting Physician (Hematology and Oncology) Kyung Rudd, MD as Consulting Physician (Radiation Oncology)  DIAGNOSIS:    ICD-10-CM   1. Malignant neoplasm of upper-outer quadrant of right breast in female, estrogen receptor positive ([place]) C50.411    Z17.0     SUMMARY OF ONCOLOGIC HISTORY:   Malignant neoplasm of upper-outer quadrant of right breast in female, estrogen receptor positive ([place])   03/18/2018 Initial Diagnosis    Screening detected bilateral breast abnormalities.  Right breast mass UOQ 2.1 cm at 10 o'clock position: Grade 1 ILC ER 70%, PR 0%, HER-2 +3+ by IHC, Ki-67 5%, suspected lymph node in the axilla could not be biopsied; left breast calcifications by ultrasound not visible but 5 cm cystic/tubular structure which on biopsy was fibrocystic change with ADHl; T2N0 stage IIa clinical stage    05/01/2018 Surgery    Bilateral mastectomies: Left mastectomy: DCIS intermediate grade 0.9 cm margins negative Tis NX stage 0; right mastectomy: ILC, grade 3, 3.5 cm, with LCIS, perineural invasion present, margins negative, 15/22 lymph nodes positive with extracapsular extension, ER 90%, PR 0%, HER-2 +(IHC 3+), Ki-67 5%, T2N3 Stage 3B     05/11/2018 Cancer Staging    Staging form: Breast, AJCC 8th Edition - Pathologic stage from 05/11/2018: Stage IIIB (pT2, pN3, cM0, G3, ER+, PR-, HER2+) - Signed by Nicholas Lose, MD on 05/11/2018    06/03/2018 Imaging    Bone scan: Increased tracer uptake in the lumbar spine at L1, L3 and L4, right fourth and seventh ribs, inferior right scapula and distal sternum, T7 and T11 as well.   CT CAP showed widespread bone metastases with possible pathologic fracture at L3, peripheral right upper lobe nodules nonspecific    06/10/2018 -  Chemotherapy   Taxol Kingenti and Perjeta      CHIEF COMPLIANT: Cycle 8 Taxol  INTERVAL HISTORY: [name] is a 53 y.o. with above-mentioned history of metastatic breast cancer currently on treatment with weekly Taxol and Kingenti Perjeta every three weeks. An ECHO from 07/24/18 showed an ejection fraction in the range of 55-60%. She presents to the clinic today with her sister, her legal guardian, for cycle8of treatment. Her last treatment went well but she reports diarrhea, no more than 2 episodes each day. She denies any tingling or numbing or fever, although her temperature at the clinic today was 99.5.   REVIEW OF SYSTEMS:   Constitutional: Denies fevers, chills or abnormal weight loss  Eyes: Denies blurriness of vision Ears, nose, mouth, throat, and face: Denies mucositis or sore throat Respiratory: Denies cough, dyspnea or wheezes Cardiovascular: Denies palpitation, chest discomfort Gastrointestinal: Denies nausea, heartburn (+) diarrhea Skin: Denies abnormal skin rashes Lymphatics: Denies new lymphadenopathy or easy bruising Neurological: Denies numbness, tingling or new weaknesses Behavioral/Psych: Mood is stable, no new changes  Extremities: No lower extremity edema Breast: denies any pain or lumps or nodules in either breasts All other systems were reviewed with the patient and are negative.  I have reviewed the past medical history, past surgical history, social history and family history with the patient and they are unchanged from previous note.  ALLERGIES:  is allergic to hydrocodone.  MEDICATIONS:  Current Outpatient Medications  Medication Sig Dispense Refill  . ibuprofen (ADVIL,MOTRIN) 600 MG tablet Take 1 tablet (600 mg total) by mouth every 6 (six) hours as  needed (for mild pain not relieved by other medications.). 30 tablet 0  . lisinopril (PRINIVIL,ZESTRIL) 40 MG tablet Take 1 tablet (40 mg total) by mouth daily. 90 tablet 1  . megestrol (MEGACE) 40 MG tablet Take 3  tablets every day (Patient taking differently: Take 60 mg by mouth 2 (two) times daily with a meal. Take 3 tablets every day) 270 tablet 11  . ondansetron (ZOFRAN-ODT) 4 MG disintegrating tablet Take 1 tablet (4 mg total) by mouth every 6 (six) hours as needed for nausea. 15 tablet 0  . pantoprazole (PROTONIX) 40 MG tablet TAKE 1 TABLET BY MOUTH 30 MINUTES PRIOR TO BREAKFAST AND SUPPER (Patient taking differently: Take 40 mg by mouth 2 (two) times daily before a meal. TAKE 1 TABLET BY MOUTH 30 MINUTES PRIOR TO BREAKFAST AND SUPPER) 180 tablet 1  . polyethylene glycol (MIRALAX / GLYCOLAX) packet Take 17 g by mouth daily as needed for moderate constipation.    . potassium chloride (K-DUR) 10 MEQ tablet Take 1 tablet (10 mEq total) by mouth daily. 30 tablet 0  . pravastatin (PRAVACHOL) 40 MG tablet Take 1 tablet (40 mg total) by mouth daily. (Patient taking differently: Take 40 mg by mouth every evening. ) 90 tablet 3   No current facility-administered medications for this visit.    Facility-Administered Medications Ordered in Other Visits  Medication Dose Route Frequency Provider Last Rate Last Dose  . sodium chloride flush (NS) 0.9 % injection 10 mL  10 mL Intracatheter PRN Nicholas Lose, MD   10 mL at 07/29/18 1059    PHYSICAL EXAMINATION: ECOG PERFORMANCE STATUS: 0 - Asymptomatic  Vitals:   07/29/18 1117  BP: (!) 143/102  Pulse: 92  Resp: 17  Temp: 99.5 F (37.5 C)  SpO2: 99%   Filed Weights   07/29/18 1117  Weight: 120 lb 14.4 oz (54.8 kg)    GENERAL: alert, no distress and comfortable SKIN: skin color, texture, turgor are normal, no rashes or significant lesions EYES: normal, Conjunctiva are pink and non-injected, sclera clear OROPHARYNX: no exudate, no erythema and lips, buccal mucosa, and tongue normal  NECK: supple, thyroid normal size, non-tender, without nodularity LYMPH: no palpable lymphadenopathy in the cervical, axillary or inguinal LUNGS: clear to auscultation and  percussion with normal breathing effort HEART: regular rate & rhythm and no murmurs and no lower extremity edema ABDOMEN: abdomen soft, non-tender and normal bowel sounds MUSCULOSKELETAL: no cyanosis of digits and no clubbing  NEURO: alert & oriented x 3 with fluent speech, no focal motor/sensory deficits EXTREMITIES: No lower extremity edema  LABORATORY DATA:  I have reviewed the data as listed CMP Latest Ref Rng & Units 07/22/2018 07/15/2018 07/08/2018  Glucose 70 - 99 mg/dL 111(H) 101(H) 97  BUN 6 - 20 mg/dL _0 Creatinine 0.44 - 1.00 mg/dL 0.70 0.65 0.68  Sodium 135 - 145 mmol/L 143 144 141  Potassium 3.5 - 5.1 mmol/L 3.7 3.1(L) 3.7  Chloride 98 - 111 mmol/L 109 109 108  CO2 22 - 32 mmol/L _1 Calcium 8.9 - 10.3 mg/dL 9.3 9.0 9.4  Total Protein 6.5 - 8.1 g/dL 7.7 7.2 7.6  Total Bilirubin 0.3 - 1.2 mg/dL 0.3 0.2(L) 0.2(L)  Alkaline Phos 38 - 126 U/L 105 105 105  AST 15 - 41 U/L 14(L) 13(L) 16  ALT 0 - 44 U/L _2 Lab Results  Component Value Date   WBC 9.0 07/29/2018   HGB 11.1 (L) 07/29/2018  HCT 34.4 (L) 07/29/2018   MCV 95.0 07/29/2018   PLT 371 07/29/2018   NEUTROABS 6.2 07/29/2018    ASSESSMENT & PLAN:  Malignant neoplasm of upper-outer quadrant of right breast in female, estrogen receptor positive ([place]) 05/01/18:Bilateral mastectomies: Left mastectomy: DCIS intermediate grade 0.9 cm margins negative Tis NX stage 0; right mastectomy: ILC, grade 3, 3.5 cm, with LCIS, perineural invasion present, margins negative, 15/22 lymph nodes positive with extracapsular extension, ER 90%, PR 0%, HER-2 +(IHC 3+), Ki-67 5%, T2N3 Stage 3B  06/03/2018 bone scan: Increased tracer uptake in the lumbar spine at L1, L3 and L4, right fourth and seventh ribs, inferior right scapula and distal sternum, T7 and T11 as well.  CT CAPshowed widespread bone metastases with possible pathologic fracture at L3, peripheral right upper lobe nodules nonspecific Patient has cerebral  palsy and hersisteris the power of attorney for healthcare Treatment plan: Palliative treatment with Taxol,Kingenti, Perjeta ------------------------------------------------------------------------------------------------------------------------ Current treatment: Cycle 8 day1TaxolKingentiand Perjeta Echo 07/24/2018 shows EF of 55-60%. Chemo toxicities: 1.Diarrhea--resolved with imodium Denies neuropathy Monitoring labs Monitoring closely for chemo toxicities.   Return to clinic weekly for chemo and every other week for follow-up with me  No orders of the defined types were placed in this encounter.  The patient has a good understanding of the overall plan. she agrees with it. she will call with any problems that may develop before the next visit here.  Nicholas Lose, MD 07/29/2018  Julious Oka Dorshimer am acting as scribe for Dr. Nicholas Lose.  I have reviewed the above documentation for accuracy and completeness, and I agree with the above.

## 2018-07-29 ENCOUNTER — Inpatient Hospital Stay: Payer: Medicare Other

## 2018-07-29 ENCOUNTER — Other Ambulatory Visit: Payer: Self-pay

## 2018-07-29 ENCOUNTER — Inpatient Hospital Stay (HOSPITAL_BASED_OUTPATIENT_CLINIC_OR_DEPARTMENT_OTHER): Payer: Medicare Other | Admitting: Hematology and Oncology

## 2018-07-29 VITALS — BP 170/84

## 2018-07-29 DIAGNOSIS — G809 Cerebral palsy, unspecified: Secondary | ICD-10-CM | POA: Diagnosis not present

## 2018-07-29 DIAGNOSIS — C7951 Secondary malignant neoplasm of bone: Secondary | ICD-10-CM | POA: Diagnosis not present

## 2018-07-29 DIAGNOSIS — E785 Hyperlipidemia, unspecified: Secondary | ICD-10-CM | POA: Diagnosis not present

## 2018-07-29 DIAGNOSIS — Z5112 Encounter for antineoplastic immunotherapy: Secondary | ICD-10-CM | POA: Diagnosis not present

## 2018-07-29 DIAGNOSIS — R682 Dry mouth, unspecified: Secondary | ICD-10-CM | POA: Diagnosis not present

## 2018-07-29 DIAGNOSIS — K219 Gastro-esophageal reflux disease without esophagitis: Secondary | ICD-10-CM | POA: Diagnosis not present

## 2018-07-29 DIAGNOSIS — R197 Diarrhea, unspecified: Secondary | ICD-10-CM | POA: Diagnosis not present

## 2018-07-29 DIAGNOSIS — C50411 Malignant neoplasm of upper-outer quadrant of right female breast: Secondary | ICD-10-CM

## 2018-07-29 DIAGNOSIS — Z95828 Presence of other vascular implants and grafts: Secondary | ICD-10-CM

## 2018-07-29 DIAGNOSIS — Z9013 Acquired absence of bilateral breasts and nipples: Secondary | ICD-10-CM

## 2018-07-29 DIAGNOSIS — Z170 Estrogen receptor positive status [ER+]: Principal | ICD-10-CM

## 2018-07-29 DIAGNOSIS — Z5111 Encounter for antineoplastic chemotherapy: Secondary | ICD-10-CM

## 2018-07-29 DIAGNOSIS — Z79899 Other long term (current) drug therapy: Secondary | ICD-10-CM

## 2018-07-29 DIAGNOSIS — I10 Essential (primary) hypertension: Secondary | ICD-10-CM | POA: Diagnosis not present

## 2018-07-29 LAB — CMP (CANCER CENTER ONLY)
ALT: 20 U/L (ref 0–44)
AST: 11 U/L — AB (ref 15–41)
Albumin: 3.8 g/dL (ref 3.5–5.0)
Alkaline Phosphatase: 102 U/L (ref 38–126)
Anion gap: 10 (ref 5–15)
BUN: 14 mg/dL (ref 6–20)
CO2: 23 mmol/L (ref 22–32)
Calcium: 9.5 mg/dL (ref 8.9–10.3)
Chloride: 109 mmol/L (ref 98–111)
Creatinine: 0.65 mg/dL (ref 0.44–1.00)
GFR, Est AFR Am: >60 mL/min (ref >60)
GFR, Estimated: >60 mL/min (ref >60)
Glucose, Bld: 97 mg/dL (ref 70–99)
POTASSIUM: 3.7 mmol/L (ref 3.5–5.1)
Sodium: 142 mmol/L (ref 135–145)
Total Bilirubin: 0.3 mg/dL (ref 0.3–1.2)
Total Protein: 7.7 g/dL (ref 6.5–8.1)

## 2018-07-29 LAB — CBC WITH DIFFERENTIAL (CANCER CENTER ONLY)
Abs Immature Granulocytes: 0.07 10*3/uL (ref 0.00–0.07)
Basophils Absolute: 0.1 10*3/uL (ref 0.0–0.1)
Basophils Relative: 1 %
Eosinophils Absolute: 0.1 10*3/uL (ref 0.0–0.5)
Eosinophils Relative: 2 %
HCT: 34.4 % — ABNORMAL LOW (ref 36.0–46.0)
Hemoglobin: 11.1 g/dL — ABNORMAL LOW (ref 12.0–15.0)
Immature Granulocytes: 1 %
Lymphocytes Relative: 22 %
Lymphs Abs: 2 10*3/uL (ref 0.7–4.0)
MCH: 30.7 pg (ref 26.0–34.0)
MCHC: 32.3 g/dL (ref 30.0–36.0)
MCV: 95 fL (ref 80.0–100.0)
Monocytes Absolute: 0.5 10*3/uL (ref 0.1–1.0)
Monocytes Relative: 6 %
NEUTROS ABS: 6.2 10*3/uL (ref 1.7–7.7)
Neutrophils Relative %: 68 %
Platelet Count: 371 10*3/uL (ref 150–400)
RBC: 3.62 MIL/uL — ABNORMAL LOW (ref 3.87–5.11)
RDW: 14.8 % (ref 11.5–15.5)
WBC Count: 9 10*3/uL (ref 4.0–10.5)
nRBC: 0 % (ref 0.0–0.2)

## 2018-07-29 MED ORDER — SODIUM CHLORIDE 0.9 % IV SOLN
20.0000 mg | Freq: Once | INTRAVENOUS | Status: AC
  Administered 2018-07-29: 20 mg via INTRAVENOUS
  Filled 2018-07-29: qty 2

## 2018-07-29 MED ORDER — DIPHENHYDRAMINE HCL 50 MG/ML IJ SOLN
INTRAMUSCULAR | Status: AC
  Filled 2018-07-29: qty 1

## 2018-07-29 MED ORDER — DEXAMETHASONE SODIUM PHOSPHATE 10 MG/ML IJ SOLN
10.0000 mg | Freq: Once | INTRAMUSCULAR | Status: AC
  Administered 2018-07-29: 10 mg via INTRAVENOUS

## 2018-07-29 MED ORDER — HEPARIN SOD (PORK) LOCK FLUSH 100 UNIT/ML IV SOLN
500.0000 [IU] | Freq: Once | INTRAVENOUS | Status: AC | PRN
  Administered 2018-07-29: 500 [IU]
  Filled 2018-07-29: qty 5

## 2018-07-29 MED ORDER — SODIUM CHLORIDE 0.9% FLUSH
10.0000 mL | INTRAVENOUS | Status: DC | PRN
  Administered 2018-07-29: 10 mL
  Filled 2018-07-29: qty 10

## 2018-07-29 MED ORDER — SODIUM CHLORIDE 0.9 % IV SOLN: 20.0000 mg | Freq: Once | INTRAVENOUS | Status: DC

## 2018-07-29 MED ORDER — SODIUM CHLORIDE 0.9 % IV SOLN
60.0000 mg/m2 | Freq: Once | INTRAVENOUS | Status: AC
  Administered 2018-07-29: 96 mg via INTRAVENOUS
  Filled 2018-07-29: qty 16

## 2018-07-29 MED ORDER — DEXAMETHASONE SODIUM PHOSPHATE 10 MG/ML IJ SOLN
INTRAMUSCULAR | Status: AC
  Filled 2018-07-29: qty 1

## 2018-07-29 MED ORDER — DIPHENHYDRAMINE HCL 50 MG/ML IJ SOLN
25.0000 mg | Freq: Once | INTRAMUSCULAR | Status: AC
  Administered 2018-07-29: 25 mg via INTRAVENOUS

## 2018-07-29 MED ORDER — SODIUM CHLORIDE 0.9 % IV SOLN
Freq: Once | INTRAVENOUS | Status: AC
  Administered 2018-07-29: 12:00:00 via INTRAVENOUS
  Filled 2018-07-29: qty 250

## 2018-07-29 NOTE — Assessment & Plan Note (Signed)
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  CT CAPshowed widespread bone metastases with possible pathologic fracture at L3, peripheral right upper lobe nodules nonspecific Patient has cerebral palsy and hersisteris the power of attorney for healthcare Treatment plan: Palliative treatment with Taxol,Kingenti, Perjeta ------------------------------------------------------------------------------------------------------------------------ Current treatment: Cycle 9 day1TaxolKingentiand Perjeta Echo 07/24/2018 shows EF of 55-60%. Chemo toxicities: 1.Diarrhea--resolved with imodium Monitoring labs Monitoring closely for chemo toxicities.   Return to clinic weekly for chemo and every other week for follow-up with me

## 2018-07-29 NOTE — Patient Instructions (Signed)
[facility] Discharge Instructions for Patients Receiving Chemotherapy  Today you received the following chemotherapy agents :  Taxol.  To help prevent nausea and vomiting after your treatment, we encourage you to take your nausea medication as prescribed.   If you develop nausea and vomiting that is not controlled by your nausea medication, call the clinic.   BELOW ARE SYMPTOMS THAT SHOULD BE REPORTED IMMEDIATELY:  *FEVER GREATER THAN 100.5 F  *CHILLS WITH OR WITHOUT FEVER  NAUSEA AND VOMITING THAT IS NOT CONTROLLED WITH YOUR NAUSEA MEDICATION  *UNUSUAL SHORTNESS OF BREATH  *UNUSUAL BRUISING OR BLEEDING  TENDERNESS IN MOUTH AND THROAT WITH OR WITHOUT PRESENCE OF ULCERS  *URINARY PROBLEMS  *BOWEL PROBLEMS  UNUSUAL RASH Items with * indicate a potential emergency and should be followed up as soon as possible.  Feel free to call the clinic should you have any questions or concerns. The clinic phone number is [phone].  Please show the CHEMO ALERT CARD at check-in to the Emergency Department and triage nurse.   

## 2018-08-05 ENCOUNTER — Other Ambulatory Visit: Payer: Self-pay | Admitting: Medical

## 2018-08-05 ENCOUNTER — Inpatient Hospital Stay: Payer: Medicare Other

## 2018-08-05 ENCOUNTER — Other Ambulatory Visit: Payer: Self-pay

## 2018-08-05 ENCOUNTER — Ambulatory Visit: Payer: Medicare Other | Admitting: Hematology and Oncology

## 2018-08-05 ENCOUNTER — Inpatient Hospital Stay (HOSPITAL_BASED_OUTPATIENT_CLINIC_OR_DEPARTMENT_OTHER): Payer: Medicare Other | Admitting: Medical

## 2018-08-05 VITALS — BP 160/90 | HR 88 | Temp 98.5°F | Resp 18

## 2018-08-05 DIAGNOSIS — C50411 Malignant neoplasm of upper-outer quadrant of right female breast: Secondary | ICD-10-CM | POA: Diagnosis not present

## 2018-08-05 DIAGNOSIS — Z5112 Encounter for antineoplastic immunotherapy: Secondary | ICD-10-CM | POA: Diagnosis not present

## 2018-08-05 DIAGNOSIS — I10 Essential (primary) hypertension: Secondary | ICD-10-CM | POA: Diagnosis not present

## 2018-08-05 DIAGNOSIS — R197 Diarrhea, unspecified: Secondary | ICD-10-CM | POA: Diagnosis not present

## 2018-08-05 DIAGNOSIS — C50911 Malignant neoplasm of unspecified site of right female breast: Secondary | ICD-10-CM

## 2018-08-05 DIAGNOSIS — G809 Cerebral palsy, unspecified: Secondary | ICD-10-CM | POA: Diagnosis not present

## 2018-08-05 DIAGNOSIS — E785 Hyperlipidemia, unspecified: Secondary | ICD-10-CM | POA: Diagnosis not present

## 2018-08-05 DIAGNOSIS — Z170 Estrogen receptor positive status [ER+]: Principal | ICD-10-CM

## 2018-08-05 DIAGNOSIS — R682 Dry mouth, unspecified: Secondary | ICD-10-CM | POA: Diagnosis not present

## 2018-08-05 DIAGNOSIS — Z9013 Acquired absence of bilateral breasts and nipples: Secondary | ICD-10-CM | POA: Diagnosis not present

## 2018-08-05 DIAGNOSIS — C7951 Secondary malignant neoplasm of bone: Secondary | ICD-10-CM | POA: Diagnosis not present

## 2018-08-05 DIAGNOSIS — K219 Gastro-esophageal reflux disease without esophagitis: Secondary | ICD-10-CM | POA: Diagnosis not present

## 2018-08-05 DIAGNOSIS — Z5111 Encounter for antineoplastic chemotherapy: Secondary | ICD-10-CM | POA: Diagnosis not present

## 2018-08-05 DIAGNOSIS — Z79899 Other long term (current) drug therapy: Secondary | ICD-10-CM | POA: Diagnosis not present

## 2018-08-05 DIAGNOSIS — Z95828 Presence of other vascular implants and grafts: Secondary | ICD-10-CM

## 2018-08-05 LAB — CBC WITH DIFFERENTIAL (CANCER CENTER ONLY)
Abs Immature Granulocytes: 0.06 10*3/uL (ref 0.00–0.07)
Basophils Absolute: 0.1 10*3/uL (ref 0.0–0.1)
Basophils Relative: 1 %
EOS PCT: 1 %
Eosinophils Absolute: 0.1 10*3/uL (ref 0.0–0.5)
HCT: 34 % — ABNORMAL LOW (ref 36.0–46.0)
Hemoglobin: 10.9 g/dL — ABNORMAL LOW (ref 12.0–15.0)
Immature Granulocytes: 1 %
Lymphocytes Relative: 24 %
Lymphs Abs: 2.2 10*3/uL (ref 0.7–4.0)
MCH: 30.6 pg (ref 26.0–34.0)
MCHC: 32.1 g/dL (ref 30.0–36.0)
MCV: 95.5 fL (ref 80.0–100.0)
Monocytes Absolute: 0.5 10*3/uL (ref 0.1–1.0)
Monocytes Relative: 6 %
Neutro Abs: 6.1 10*3/uL (ref 1.7–7.7)
Neutrophils Relative %: 67 %
Platelet Count: 391 10*3/uL (ref 150–400)
RBC: 3.56 MIL/uL — ABNORMAL LOW (ref 3.87–5.11)
RDW: 14.9 % (ref 11.5–15.5)
WBC Count: 9 10*3/uL (ref 4.0–10.5)
nRBC: 0 % (ref 0.0–0.2)

## 2018-08-05 LAB — CMP (CANCER CENTER ONLY)
ALT: 28 U/L (ref 0–44)
AST: 16 U/L (ref 15–41)
Albumin: 3.8 g/dL (ref 3.5–5.0)
Alkaline Phosphatase: 97 U/L (ref 38–126)
Anion gap: 9 (ref 5–15)
BUN: 15 mg/dL (ref 6–20)
CO2: 25 mmol/L (ref 22–32)
Calcium: 9.3 mg/dL (ref 8.9–10.3)
Chloride: 109 mmol/L (ref 98–111)
Creatinine: 0.62 mg/dL (ref 0.44–1.00)
GFR, Est AFR Am: >60 mL/min (ref >60)
GFR, Estimated: >60 mL/min (ref >60)
GLUCOSE: 102 mg/dL — AB (ref 70–99)
Potassium: 3.7 mmol/L (ref 3.5–5.1)
Sodium: 143 mmol/L (ref 135–145)
Total Bilirubin: 0.2 mg/dL — ABNORMAL LOW (ref 0.3–1.2)
Total Protein: 7.7 g/dL (ref 6.5–8.1)

## 2018-08-05 MED ORDER — SODIUM CHLORIDE 0.9% FLUSH
10.0000 mL | INTRAVENOUS | Status: DC | PRN
  Administered 2018-08-05: 10 mL
  Filled 2018-08-05: qty 10

## 2018-08-05 MED ORDER — DIPHENOXYLATE-ATROPINE 2.5-0.025 MG PO TABS
1.0000 | ORAL_TABLET | Freq: Once | ORAL | Status: AC
  Administered 2018-08-05: 1 via ORAL

## 2018-08-05 MED ORDER — DIPHENHYDRAMINE HCL 50 MG/ML IJ SOLN
INTRAMUSCULAR | Status: AC
  Filled 2018-08-05: qty 1

## 2018-08-05 MED ORDER — SODIUM CHLORIDE 0.9 % IV SOLN
60.0000 mg/m2 | Freq: Once | INTRAVENOUS | Status: AC
  Administered 2018-08-05: 96 mg via INTRAVENOUS
  Filled 2018-08-05: qty 16

## 2018-08-05 MED ORDER — DIPHENOXYLATE-ATROPINE 2.5-0.025 MG PO TABS
ORAL_TABLET | ORAL | Status: AC
  Filled 2018-08-05: qty 1

## 2018-08-05 MED ORDER — DIPHENOXYLATE-ATROPINE 2.5-0.025 MG PO TABS: ORAL_TABLET | ORAL | 2 refills | Status: DC

## 2018-08-05 MED ORDER — SODIUM CHLORIDE 0.9 % IV SOLN
20.0000 mg | Freq: Once | INTRAVENOUS | Status: AC
  Administered 2018-08-05: 20 mg via INTRAVENOUS
  Filled 2018-08-05: qty 2

## 2018-08-05 MED ORDER — SODIUM CHLORIDE 0.9 % IV SOLN
Freq: Once | INTRAVENOUS | Status: AC
  Administered 2018-08-05: 12:00:00 via INTRAVENOUS
  Filled 2018-08-05: qty 250

## 2018-08-05 MED ORDER — DIPHENHYDRAMINE HCL 50 MG/ML IJ SOLN
25.0000 mg | Freq: Once | INTRAMUSCULAR | Status: AC
  Administered 2018-08-05: 25 mg via INTRAVENOUS

## 2018-08-05 MED ORDER — HEPARIN SOD (PORK) LOCK FLUSH 100 UNIT/ML IV SOLN
500.0000 [IU] | Freq: Once | INTRAVENOUS | Status: AC | PRN
  Administered 2018-08-05: 500 [IU]
  Filled 2018-08-05: qty 5

## 2018-08-05 MED ORDER — DEXAMETHASONE SODIUM PHOSPHATE 10 MG/ML IJ SOLN
INTRAMUSCULAR | Status: AC
  Filled 2018-08-05: qty 1

## 2018-08-05 MED ORDER — DEXAMETHASONE SODIUM PHOSPHATE 10 MG/ML IJ SOLN
10.0000 mg | Freq: Once | INTRAMUSCULAR | Status: AC
  Administered 2018-08-05: 10 mg via INTRAVENOUS

## 2018-08-05 NOTE — Patient Instructions (Signed)
[facility] Discharge Instructions for Patients Receiving Chemotherapy  Today you received the following chemotherapy agents :  Taxol.  To help prevent nausea and vomiting after your treatment, we encourage you to take your nausea medication as prescribed.   If you develop nausea and vomiting that is not controlled by your nausea medication, call the clinic.   BELOW ARE SYMPTOMS THAT SHOULD BE REPORTED IMMEDIATELY:  *FEVER GREATER THAN 100.5 F  *CHILLS WITH OR WITHOUT FEVER  NAUSEA AND VOMITING THAT IS NOT CONTROLLED WITH YOUR NAUSEA MEDICATION  *UNUSUAL SHORTNESS OF BREATH  *UNUSUAL BRUISING OR BLEEDING  TENDERNESS IN MOUTH AND THROAT WITH OR WITHOUT PRESENCE OF ULCERS  *URINARY PROBLEMS  *BOWEL PROBLEMS  UNUSUAL RASH Items with * indicate a potential emergency and should be followed up as soon as possible.  Feel free to call the clinic should you have any questions or concerns. The clinic phone number is [phone].  Please show the CHEMO ALERT CARD at check-in to the Emergency Department and triage nurse.   

## 2018-08-05 NOTE — Progress Notes (Signed)
The patient was seen in the infusion room.  She has had intermittent diarrhea of up to approximately 2 times per day.  The family has been giving her Imodium as needed with limited benefit.  I explained to them that they could either increase her dosing of Imodium or they could have a prescription for Lomotil.  She was given Lomotil p.o. x1 today while she was here and then was given a prescription of Lomotil which was sent to her pharmacy.  Sandi Mealy, MH S, PA-C Physician Assistant

## 2018-08-08 ENCOUNTER — Other Ambulatory Visit: Payer: Self-pay | Admitting: Hematology and Oncology

## 2018-08-11 NOTE — Progress Notes (Signed)
Patient Care Team: Janora Norlander, DO as PCP - General (Family Medicine) Danie Binder, MD (Gastroenterology) Alphonsa Overall, MD as Consulting Physician (General Surgery) Nicholas Lose, MD as Consulting Physician (Hematology and Oncology) Kyung Rudd, MD as Consulting Physician (Radiation Oncology)  DIAGNOSIS:    ICD-10-CM   1. Malignant neoplasm of upper-outer quadrant of right breast in female, estrogen receptor positive ([place]) C50.411    Z17.0     SUMMARY OF ONCOLOGIC HISTORY:   Malignant neoplasm of upper-outer quadrant of right breast in female, estrogen receptor positive ([place])   03/18/2018 Initial Diagnosis    Screening detected bilateral breast abnormalities.  Right breast mass UOQ 2.1 cm at 10 o'clock position: Grade 1 ILC ER 70%, PR 0%, HER-2 +3+ by IHC, Ki-67 5%, suspected lymph node in the axilla could not be biopsied; left breast calcifications by ultrasound not visible but 5 cm cystic/tubular structure which on biopsy was fibrocystic change with ADHl; T2N0 stage IIa clinical stage    05/01/2018 Surgery    Bilateral mastectomies: Left mastectomy: DCIS intermediate grade 0.9 cm margins negative Tis NX stage 0; right mastectomy: ILC, grade 3, 3.5 cm, with LCIS, perineural invasion present, margins negative, 15/22 lymph nodes positive with extracapsular extension, ER 90%, PR 0%, HER-2 +(IHC 3+), Ki-67 5%, T2N3 Stage 3B     05/11/2018 Cancer Staging    Staging form: Breast, AJCC 8th Edition - Pathologic stage from 05/11/2018: Stage IIIB (pT2, pN3, cM0, G3, ER+, PR-, HER2+) - Signed by Nicholas Lose, MD on 05/11/2018    06/03/2018 Imaging    Bone scan: Increased tracer uptake in the lumbar spine at L1, L3 and L4, right fourth and seventh ribs, inferior right scapula and distal sternum, T7 and T11 as well.   CT CAP showed widespread bone metastases with possible pathologic fracture at L3, peripheral right upper lobe nodules nonspecific    06/10/2018 -  Chemotherapy   Taxol Kingenti and Perjeta      CHIEF COMPLIANT: Cycle 10 Taxol Kingenti Perjeta  INTERVAL HISTORY: [name] is a 53 y.o. with above-mentioned history of metastatic breast cancer currently on treatment with weekly Taxol and Herceptin Perjeta every three weeks. She presents to the clinic alone today without any problems or concerns.  Denies any nausea or vomiting.  Denies any fevers or chills.  Does not complain of neuropathy.  REVIEW OF SYSTEMS:   Constitutional: Denies fevers, chills or abnormal weight loss Eyes: Denies blurriness of vision Ears, nose, mouth, throat, and face: Denies mucositis or sore throat Respiratory: Denies cough, dyspnea or wheezes Cardiovascular: Denies palpitation, chest discomfort Gastrointestinal: Denies nausea, heartburn or change in bowel habits Skin: Denies abnormal skin rashes Lymphatics: Denies new lymphadenopathy or easy bruising Neurological: Wheelchair-bound Behavioral/Psych: Mood is stable, no new changes  Extremities: No lower extremity edema Breast: denies any pain or lumps or nodules in either breasts All other systems were reviewed with the patient and are negative.  I have reviewed the past medical history, past surgical history, social history and family history with the patient and they are unchanged from previous note.  ALLERGIES:  is allergic to hydrocodone.  MEDICATIONS:  Current Outpatient Medications  Medication Sig Dispense Refill  . diphenoxylate-atropine (LOMOTIL) 2.5-0.025 MG tablet 1 to 2 tablets 4 times daily prn diarrhea 40 tablet 2  . ibuprofen (ADVIL,MOTRIN) 600 MG tablet Take 1 tablet (600 mg total) by mouth every 6 (six) hours as needed (for mild pain not relieved by other medications.). 30 tablet 0  .  lisinopril (PRINIVIL,ZESTRIL) 40 MG tablet Take 1 tablet (40 mg total) by mouth daily. 90 tablet 1  . megestrol (MEGACE) 40 MG tablet Take 3 tablets every day (Patient taking differently: Take 60 mg by mouth 2 (two) times  daily with a meal. Take 3 tablets every day) 270 tablet 11  . ondansetron (ZOFRAN-ODT) 4 MG disintegrating tablet Take 1 tablet (4 mg total) by mouth every 6 (six) hours as needed for nausea. 15 tablet 0  . pantoprazole (PROTONIX) 40 MG tablet TAKE 1 TABLET BY MOUTH 30 MINUTES PRIOR TO BREAKFAST AND SUPPER (Patient taking differently: Take 40 mg by mouth 2 (two) times daily before a meal. TAKE 1 TABLET BY MOUTH 30 MINUTES PRIOR TO BREAKFAST AND SUPPER) 180 tablet 1  . polyethylene glycol (MIRALAX / GLYCOLAX) packet Take 17 g by mouth daily as needed for moderate constipation.    . potassium chloride (K-DUR) 10 MEQ tablet TAKE 1 TABLET BY MOUTH EVERY DAY 30 tablet 0  . pravastatin (PRAVACHOL) 40 MG tablet Take 1 tablet (40 mg total) by mouth daily. (Patient taking differently: Take 40 mg by mouth every evening. ) 90 tablet 3   No current facility-administered medications for this visit.     PHYSICAL EXAMINATION: ECOG PERFORMANCE STATUS: 1 - Symptomatic but completely ambulatory  Vitals:   08/12/18 0919  BP: (!) 159/95  Pulse: 94  Resp: 17  Temp: 99.1 F (37.3 C)  SpO2: 100%   Filed Weights   08/12/18 0919  Weight: 122 lb 4.8 oz (55.5 kg)    GENERAL: alert, no distress and comfortable SKIN: skin color, texture, turgor are normal, no rashes or significant lesions EYES: normal, Conjunctiva are pink and non-injected, sclera clear OROPHARYNX: no exudate, no erythema and lips, buccal mucosa, and tongue normal  NECK: supple, thyroid normal size, non-tender, without nodularity LYMPH: no palpable lymphadenopathy in the cervical, axillary or inguinal LUNGS: clear to auscultation and percussion with normal breathing effort HEART: regular rate & rhythm and no murmurs and no lower extremity edema ABDOMEN: abdomen soft, non-tender and normal bowel sounds MUSCULOSKELETAL: no cyanosis of digits and no clubbing  NEURO: alert & oriented x 3 with fluent speech, no focal motor/sensory deficits  EXTREMITIES: No lower extremity edema  LABORATORY DATA:  I have reviewed the data as listed CMP Latest Ref Rng & Units 08/05/2018 07/29/2018 07/22/2018  Glucose 70 - 99 mg/dL 102(H) 97 111(H)  BUN 6 - 20 mg/dL _0 Creatinine 0.44 - 1.00 mg/dL 0.62 0.65 0.70  Sodium 135 - 145 mmol/L 143 142 143  Potassium 3.5 - 5.1 mmol/L 3.7 3.7 3.7  Chloride 98 - 111 mmol/L 109 109 109  CO2 22 - 32 mmol/L _1 Calcium 8.9 - 10.3 mg/dL 9.3 9.5 9.3  Total Protein 6.5 - 8.1 g/dL 7.7 7.7 7.7  Total Bilirubin 0.3 - 1.2 mg/dL 0.2(L) 0.3 0.3  Alkaline Phos 38 - 126 U/L 97 102 105  AST 15 - 41 U/L 16 11(L) 14(L)  ALT 0 - 44 U/L _2 Lab Results  Component Value Date   WBC 8.5 08/12/2018   HGB 11.2 (L) 08/12/2018   HCT 34.7 (L) 08/12/2018   MCV 96.1 08/12/2018   PLT 402 (H) 08/12/2018   NEUTROABS 5.8 08/12/2018    ASSESSMENT & PLAN:  Malignant neoplasm of upper-outer quadrant of right breast in female, estrogen receptor positive ([facility]) 05/01/18:Bilateral mastectomies: Left mastectomy: DCIS intermediate grade 0.9 cm margins negative Tis NX stage  0; right mastectomy: ILC, grade 3, 3.5 cm, with LCIS, perineural invasion present, margins negative, 15/22 lymph nodes positive with extracapsular extension, ER 90%, PR 0%, HER-2 +(IHC 3+), Ki-67 5%, T2N3 Stage 3B  06/03/2018 bone scan: Increased tracer uptake in the lumbar spine at L1, L3 and L4, right fourth and seventh ribs, inferior right scapula and distal sternum, T7 and T11 as well.  CT CAPshowed widespread bone metastases with possible pathologic fracture at L3, peripheral right upper lobe nodules nonspecific Patient has cerebral palsy and hersisteris the power of attorney for healthcare Treatment plan: Palliative treatment with Trina Ao, Perjeta ------------------------------------------------------------------------------------------------------------------------ Current treatment: Cycle10day1TaxolKingentiand Perjeta  Echo 07/24/2018 shows EF of 55-60%. Chemo toxicities: 1.Diarrhea--resolved with imodium Denies neuropathy Monitoring labs Monitoring closely for chemo toxicities.   Return to clinic weekly for chemo and every other week for follow-up with me. Once she finishes 12 cycles of Taxol then she will go on Kingenti and Perjeta maintenance vs Herceptin alone maintenance. We will obtain a PET scan to assess treatment response.  No orders of the defined types were placed in this encounter.  The patient has a good understanding of the overall plan. she agrees with it. she will call with any problems that may develop before the next visit here.  Rulon Eisenmenger, MD 08/12/2018  Julious Oka Dorshimer am acting as scribe for Dr. Nicholas Lose.  I have reviewed the above documentation for accuracy and completeness, and I agree with the above.

## 2018-08-12 ENCOUNTER — Other Ambulatory Visit: Payer: Medicare Other

## 2018-08-12 ENCOUNTER — Inpatient Hospital Stay (HOSPITAL_BASED_OUTPATIENT_CLINIC_OR_DEPARTMENT_OTHER): Payer: Medicare Other | Admitting: Hematology and Oncology

## 2018-08-12 ENCOUNTER — Encounter: Payer: Self-pay | Admitting: *Deleted

## 2018-08-12 ENCOUNTER — Inpatient Hospital Stay: Payer: Medicare Other

## 2018-08-12 ENCOUNTER — Inpatient Hospital Stay: Payer: Medicare Other | Attending: Hematology and Oncology

## 2018-08-12 ENCOUNTER — Ambulatory Visit: Payer: Medicare Other | Admitting: Adult Health

## 2018-08-12 ENCOUNTER — Ambulatory Visit: Payer: Medicare Other

## 2018-08-12 ENCOUNTER — Other Ambulatory Visit: Payer: Self-pay

## 2018-08-12 VITALS — BP 155/84

## 2018-08-12 DIAGNOSIS — C50411 Malignant neoplasm of upper-outer quadrant of right female breast: Secondary | ICD-10-CM

## 2018-08-12 DIAGNOSIS — G629 Polyneuropathy, unspecified: Secondary | ICD-10-CM | POA: Diagnosis not present

## 2018-08-12 DIAGNOSIS — G809 Cerebral palsy, unspecified: Secondary | ICD-10-CM | POA: Insufficient documentation

## 2018-08-12 DIAGNOSIS — Z9013 Acquired absence of bilateral breasts and nipples: Secondary | ICD-10-CM | POA: Insufficient documentation

## 2018-08-12 DIAGNOSIS — C7951 Secondary malignant neoplasm of bone: Secondary | ICD-10-CM

## 2018-08-12 DIAGNOSIS — Z5111 Encounter for antineoplastic chemotherapy: Secondary | ICD-10-CM

## 2018-08-12 DIAGNOSIS — Z170 Estrogen receptor positive status [ER+]: Secondary | ICD-10-CM | POA: Insufficient documentation

## 2018-08-12 DIAGNOSIS — Z79899 Other long term (current) drug therapy: Secondary | ICD-10-CM

## 2018-08-12 DIAGNOSIS — Z5112 Encounter for antineoplastic immunotherapy: Secondary | ICD-10-CM | POA: Insufficient documentation

## 2018-08-12 DIAGNOSIS — Z95828 Presence of other vascular implants and grafts: Secondary | ICD-10-CM

## 2018-08-12 LAB — CBC WITH DIFFERENTIAL (CANCER CENTER ONLY)
Abs Immature Granulocytes: 0.07 10*3/uL (ref 0.00–0.07)
Basophils Absolute: 0.1 10*3/uL (ref 0.0–0.1)
Basophils Relative: 1 %
Eosinophils Absolute: 0.1 10*3/uL (ref 0.0–0.5)
Eosinophils Relative: 1 %
HCT: 34.7 % — ABNORMAL LOW (ref 36.0–46.0)
Hemoglobin: 11.2 g/dL — ABNORMAL LOW (ref 12.0–15.0)
Immature Granulocytes: 1 %
Lymphocytes Relative: 24 %
Lymphs Abs: 2 10*3/uL (ref 0.7–4.0)
MCH: 31 pg (ref 26.0–34.0)
MCHC: 32.3 g/dL (ref 30.0–36.0)
MCV: 96.1 fL (ref 80.0–100.0)
Monocytes Absolute: 0.5 10*3/uL (ref 0.1–1.0)
Monocytes Relative: 6 %
Neutro Abs: 5.8 10*3/uL (ref 1.7–7.7)
Neutrophils Relative %: 67 %
Platelet Count: 402 10*3/uL — ABNORMAL HIGH (ref 150–400)
RBC: 3.61 MIL/uL — ABNORMAL LOW (ref 3.87–5.11)
RDW: 14.8 % (ref 11.5–15.5)
WBC Count: 8.5 10*3/uL (ref 4.0–10.5)
nRBC: 0 % (ref 0.0–0.2)

## 2018-08-12 LAB — CMP (CANCER CENTER ONLY)
ALT: 28 U/L (ref 0–44)
AST: 16 U/L (ref 15–41)
Albumin: 3.8 g/dL (ref 3.5–5.0)
Alkaline Phosphatase: 90 U/L (ref 38–126)
Anion gap: 11 (ref 5–15)
BUN: 16 mg/dL (ref 6–20)
CO2: 23 mmol/L (ref 22–32)
Calcium: 9.5 mg/dL (ref 8.9–10.3)
Chloride: 108 mmol/L (ref 98–111)
Creatinine: 0.7 mg/dL (ref 0.44–1.00)
GFR, Est AFR Am: >60 mL/min (ref >60)
GFR, Estimated: >60 mL/min (ref >60)
Glucose, Bld: 109 mg/dL — ABNORMAL HIGH (ref 70–99)
Potassium: 3.9 mmol/L (ref 3.5–5.1)
Sodium: 142 mmol/L (ref 135–145)
Total Bilirubin: 0.2 mg/dL — ABNORMAL LOW (ref 0.3–1.2)
Total Protein: 7.7 g/dL (ref 6.5–8.1)

## 2018-08-12 MED ORDER — ACETAMINOPHEN 325 MG PO TABS
650.0000 mg | ORAL_TABLET | Freq: Once | ORAL | Status: AC
  Administered 2018-08-12: 650 mg via ORAL

## 2018-08-12 MED ORDER — SODIUM CHLORIDE 0.9 % IV SOLN
420.0000 mg | Freq: Once | INTRAVENOUS | Status: AC
  Administered 2018-08-12: 12:00:00 420 mg via INTRAVENOUS
  Filled 2018-08-12: qty 14

## 2018-08-12 MED ORDER — DEXAMETHASONE SODIUM PHOSPHATE 10 MG/ML IJ SOLN
10.0000 mg | Freq: Once | INTRAMUSCULAR | Status: AC
  Administered 2018-08-12: 10 mg via INTRAVENOUS

## 2018-08-12 MED ORDER — SODIUM CHLORIDE 0.9 % IV SOLN
20.0000 mg | Freq: Once | INTRAVENOUS | Status: AC
  Administered 2018-08-12: 11:00:00 20 mg via INTRAVENOUS
  Filled 2018-08-12: qty 2

## 2018-08-12 MED ORDER — FAMOTIDINE IN NACL 20-0.9 MG/50ML-% IV SOLN: 20.0000 mg | Freq: Once | INTRAVENOUS | Status: DC

## 2018-08-12 MED ORDER — HEPARIN SOD (PORK) LOCK FLUSH 100 UNIT/ML IV SOLN
500.0000 [IU] | Freq: Once | INTRAVENOUS | Status: AC | PRN
  Administered 2018-08-12: 500 [IU]
  Filled 2018-08-12: qty 5

## 2018-08-12 MED ORDER — SODIUM CHLORIDE 0.9% FLUSH
10.0000 mL | INTRAVENOUS | Status: DC | PRN
  Administered 2018-08-12: 09:00:00 10 mL
  Filled 2018-08-12: qty 10

## 2018-08-12 MED ORDER — DIPHENHYDRAMINE HCL 50 MG/ML IJ SOLN
INTRAMUSCULAR | Status: AC
  Filled 2018-08-12: qty 1

## 2018-08-12 MED ORDER — TRASTUZUMAB CHEMO 150 MG IV SOLR
6.0000 mg/kg | Freq: Once | INTRAVENOUS | Status: AC
  Administered 2018-08-12: 11:00:00 336 mg via INTRAVENOUS
  Filled 2018-08-12: qty 16

## 2018-08-12 MED ORDER — DIPHENHYDRAMINE HCL 50 MG/ML IJ SOLN
25.0000 mg | Freq: Once | INTRAMUSCULAR | Status: AC
  Administered 2018-08-12: 25 mg via INTRAVENOUS

## 2018-08-12 MED ORDER — ACETAMINOPHEN 325 MG PO TABS
ORAL_TABLET | ORAL | Status: AC
  Filled 2018-08-12: qty 2

## 2018-08-12 MED ORDER — SODIUM CHLORIDE 0.9 % IV SOLN
60.0000 mg/m2 | Freq: Once | INTRAVENOUS | Status: AC
  Administered 2018-08-12: 96 mg via INTRAVENOUS
  Filled 2018-08-12: qty 16

## 2018-08-12 MED ORDER — SODIUM CHLORIDE 0.9 % IV SOLN
Freq: Once | INTRAVENOUS | Status: AC
  Administered 2018-08-12: 10:00:00 via INTRAVENOUS
  Filled 2018-08-12: qty 250

## 2018-08-12 MED ORDER — SODIUM CHLORIDE 0.9% FLUSH
10.0000 mL | INTRAVENOUS | Status: DC | PRN
  Administered 2018-08-12: 10 mL
  Filled 2018-08-12: qty 10

## 2018-08-12 MED ORDER — DEXAMETHASONE SODIUM PHOSPHATE 10 MG/ML IJ SOLN
INTRAMUSCULAR | Status: AC
  Filled 2018-08-12: qty 1

## 2018-08-12 NOTE — Patient Instructions (Signed)
[facility] Discharge Instructions for Patients Receiving Chemotherapy  Today you received the following chemotherapy agents : Herceptin, Perjeta, Taxol.  To help prevent nausea and vomiting after your treatment, we encourage you to take your nausea medication as prescribed.   If you develop nausea and vomiting that is not controlled by your nausea medication, call the clinic.   BELOW ARE SYMPTOMS THAT SHOULD BE REPORTED IMMEDIATELY:  *FEVER GREATER THAN 100.5 F  *CHILLS WITH OR WITHOUT FEVER  NAUSEA AND VOMITING THAT IS NOT CONTROLLED WITH YOUR NAUSEA MEDICATION  *UNUSUAL SHORTNESS OF BREATH  *UNUSUAL BRUISING OR BLEEDING  TENDERNESS IN MOUTH AND THROAT WITH OR WITHOUT PRESENCE OF ULCERS  *URINARY PROBLEMS  *BOWEL PROBLEMS  UNUSUAL RASH Items with * indicate a potential emergency and should be followed up as soon as possible.  Feel free to call the clinic should you have any questions or concerns. The clinic phone number is [phone].  Please show the [facility] at check-in to the Emergency Department and triage nurse.

## 2018-08-12 NOTE — Assessment & Plan Note (Signed)
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  CT CAPshowed widespread bone metastases with possible pathologic fracture at L3, peripheral right upper lobe nodules nonspecific Patient has cerebral palsy and hersisteris the power of attorney for healthcare Treatment plan: Palliative treatment with Trina Ao, Perjeta ------------------------------------------------------------------------------------------------------------------------ Current treatment: Cycle10day1TaxolKingentiand Perjeta Echo 07/24/2018 shows EF of 55-60%. Chemo toxicities: 1.Diarrhea--resolved with imodium Denies neuropathy Monitoring labs Monitoring closely for chemo toxicities.   Return to clinic weekly for chemo and every other week for follow-up with me. Once she finishes 12 cycles of Taxol then she will go on congenitally and Perjeta maintenance. We will obtain a PET scan to assess treatment response.

## 2018-08-19 ENCOUNTER — Inpatient Hospital Stay: Payer: Medicare Other

## 2018-08-19 ENCOUNTER — Ambulatory Visit: Payer: Medicare Other | Admitting: Adult Health

## 2018-08-19 ENCOUNTER — Other Ambulatory Visit: Payer: Self-pay

## 2018-08-19 VITALS — BP 149/94 | HR 86 | Temp 98.6°F | Resp 18

## 2018-08-19 DIAGNOSIS — C50411 Malignant neoplasm of upper-outer quadrant of right female breast: Secondary | ICD-10-CM | POA: Diagnosis not present

## 2018-08-19 DIAGNOSIS — Z79899 Other long term (current) drug therapy: Secondary | ICD-10-CM | POA: Diagnosis not present

## 2018-08-19 DIAGNOSIS — G629 Polyneuropathy, unspecified: Secondary | ICD-10-CM | POA: Diagnosis not present

## 2018-08-19 DIAGNOSIS — Z170 Estrogen receptor positive status [ER+]: Secondary | ICD-10-CM | POA: Diagnosis not present

## 2018-08-19 DIAGNOSIS — Z5112 Encounter for antineoplastic immunotherapy: Secondary | ICD-10-CM | POA: Diagnosis not present

## 2018-08-19 DIAGNOSIS — G809 Cerebral palsy, unspecified: Secondary | ICD-10-CM | POA: Diagnosis not present

## 2018-08-19 DIAGNOSIS — C7951 Secondary malignant neoplasm of bone: Secondary | ICD-10-CM | POA: Diagnosis not present

## 2018-08-19 DIAGNOSIS — Z9013 Acquired absence of bilateral breasts and nipples: Secondary | ICD-10-CM | POA: Diagnosis not present

## 2018-08-19 DIAGNOSIS — Z5111 Encounter for antineoplastic chemotherapy: Secondary | ICD-10-CM | POA: Diagnosis not present

## 2018-08-19 DIAGNOSIS — Z95828 Presence of other vascular implants and grafts: Secondary | ICD-10-CM

## 2018-08-19 LAB — CMP (CANCER CENTER ONLY)
ALT: 47 U/L — ABNORMAL HIGH (ref 0–44)
AST: 13 U/L — ABNORMAL LOW (ref 15–41)
Albumin: 3.8 g/dL (ref 3.5–5.0)
Alkaline Phosphatase: 90 U/L (ref 38–126)
Anion gap: 10 (ref 5–15)
BUN: 21 mg/dL — ABNORMAL HIGH (ref 6–20)
CO2: 25 mmol/L (ref 22–32)
Calcium: 9.4 mg/dL (ref 8.9–10.3)
Chloride: 107 mmol/L (ref 98–111)
Creatinine: 0.71 mg/dL (ref 0.44–1.00)
GFR, Est AFR Am: >60 mL/min (ref >60)
GFR, Estimated: >60 mL/min (ref >60)
Glucose, Bld: 129 mg/dL — ABNORMAL HIGH (ref 70–99)
Potassium: 3.7 mmol/L (ref 3.5–5.1)
Sodium: 142 mmol/L (ref 135–145)
Total Bilirubin: 0.2 mg/dL — ABNORMAL LOW (ref 0.3–1.2)
Total Protein: 7.6 g/dL (ref 6.5–8.1)

## 2018-08-19 LAB — CBC WITH DIFFERENTIAL (CANCER CENTER ONLY)
Abs Immature Granulocytes: 0.29 10*3/uL — ABNORMAL HIGH (ref 0.00–0.07)
Basophils Absolute: 0.1 10*3/uL (ref 0.0–0.1)
Basophils Relative: 0 %
Eosinophils Absolute: 0.1 10*3/uL (ref 0.0–0.5)
Eosinophils Relative: 1 %
HCT: 33.1 % — ABNORMAL LOW (ref 36.0–46.0)
Hemoglobin: 10.9 g/dL — ABNORMAL LOW (ref 12.0–15.0)
Immature Granulocytes: 2 %
Lymphocytes Relative: 19 %
Lymphs Abs: 2.5 10*3/uL (ref 0.7–4.0)
MCH: 31.2 pg (ref 26.0–34.0)
MCHC: 32.9 g/dL (ref 30.0–36.0)
MCV: 94.8 fL (ref 80.0–100.0)
Monocytes Absolute: 0.7 10*3/uL (ref 0.1–1.0)
Monocytes Relative: 6 %
Neutro Abs: 9.5 10*3/uL — ABNORMAL HIGH (ref 1.7–7.7)
Neutrophils Relative %: 72 %
Platelet Count: 420 10*3/uL — ABNORMAL HIGH (ref 150–400)
RBC: 3.49 MIL/uL — ABNORMAL LOW (ref 3.87–5.11)
RDW: 14.8 % (ref 11.5–15.5)
WBC Count: 13.2 10*3/uL — ABNORMAL HIGH (ref 4.0–10.5)
nRBC: 0.2 % (ref 0.0–0.2)

## 2018-08-19 MED ORDER — HEPARIN SOD (PORK) LOCK FLUSH 100 UNIT/ML IV SOLN
500.0000 [IU] | Freq: Once | INTRAVENOUS | Status: AC | PRN
  Administered 2018-08-19: 500 [IU]
  Filled 2018-08-19: qty 5

## 2018-08-19 MED ORDER — DIPHENHYDRAMINE HCL 50 MG/ML IJ SOLN
25.0000 mg | Freq: Once | INTRAMUSCULAR | Status: AC
  Administered 2018-08-19: 25 mg via INTRAVENOUS

## 2018-08-19 MED ORDER — SODIUM CHLORIDE 0.9 % IV SOLN
60.0000 mg/m2 | Freq: Once | INTRAVENOUS | Status: AC
  Administered 2018-08-19: 96 mg via INTRAVENOUS
  Filled 2018-08-19: qty 16

## 2018-08-19 MED ORDER — SODIUM CHLORIDE 0.9 % IV SOLN
20.0000 mg | Freq: Once | INTRAVENOUS | Status: AC
  Administered 2018-08-19: 20 mg via INTRAVENOUS
  Filled 2018-08-19: qty 2

## 2018-08-19 MED ORDER — SODIUM CHLORIDE 0.9% FLUSH
10.0000 mL | INTRAVENOUS | Status: DC | PRN
  Administered 2018-08-19: 10 mL
  Filled 2018-08-19: qty 10

## 2018-08-19 MED ORDER — DEXAMETHASONE SODIUM PHOSPHATE 10 MG/ML IJ SOLN
INTRAMUSCULAR | Status: AC
  Filled 2018-08-19: qty 1

## 2018-08-19 MED ORDER — DEXAMETHASONE SODIUM PHOSPHATE 10 MG/ML IJ SOLN
10.0000 mg | Freq: Once | INTRAMUSCULAR | Status: AC
  Administered 2018-08-19: 10 mg via INTRAVENOUS

## 2018-08-19 MED ORDER — SODIUM CHLORIDE 0.9% FLUSH
10.0000 mL | INTRAVENOUS | Status: DC | PRN
  Administered 2018-08-19: 11:00:00 10 mL
  Filled 2018-08-19: qty 10

## 2018-08-19 MED ORDER — DIPHENHYDRAMINE HCL 50 MG/ML IJ SOLN
INTRAMUSCULAR | Status: AC
  Filled 2018-08-19: qty 1

## 2018-08-19 MED ORDER — FAMOTIDINE IN NACL 20-0.9 MG/50ML-% IV SOLN: 20.0000 mg | Freq: Once | INTRAVENOUS | Status: DC

## 2018-08-19 MED ORDER — SODIUM CHLORIDE 0.9 % IV SOLN
Freq: Once | INTRAVENOUS | Status: AC
  Administered 2018-08-19: 12:00:00 via INTRAVENOUS
  Filled 2018-08-19: qty 250

## 2018-08-19 NOTE — Patient Instructions (Signed)
[facility] Discharge Instructions for Patients Receiving Chemotherapy  Today you received the following chemotherapy agents :  Taxol.  To help prevent nausea and vomiting after your treatment, we encourage you to take your nausea medication as prescribed.   If you develop nausea and vomiting that is not controlled by your nausea medication, call the clinic.   BELOW ARE SYMPTOMS THAT SHOULD BE REPORTED IMMEDIATELY:  *FEVER GREATER THAN 100.5 F  *CHILLS WITH OR WITHOUT FEVER  NAUSEA AND VOMITING THAT IS NOT CONTROLLED WITH YOUR NAUSEA MEDICATION  *UNUSUAL SHORTNESS OF BREATH  *UNUSUAL BRUISING OR BLEEDING  TENDERNESS IN MOUTH AND THROAT WITH OR WITHOUT PRESENCE OF ULCERS  *URINARY PROBLEMS  *BOWEL PROBLEMS  UNUSUAL RASH Items with * indicate a potential emergency and should be followed up as soon as possible.  Feel free to call the clinic should you have any questions or concerns. The clinic phone number is [phone].  Please show the CHEMO ALERT CARD at check-in to the Emergency Department and triage nurse.   

## 2018-08-19 NOTE — Patient Instructions (Signed)

## 2018-08-25 NOTE — Progress Notes (Signed)
Patient Care Team: Janora Norlander, DO as PCP - General (Family Medicine) Danie Binder, MD (Gastroenterology) Alphonsa Overall, MD as Consulting Physician (General Surgery) Nicholas Lose, MD as Consulting Physician (Hematology and Oncology) Kyung Rudd, MD as Consulting Physician (Radiation Oncology)  DIAGNOSIS:    ICD-10-CM   1. Malignant neoplasm of upper-outer quadrant of right breast in female, estrogen receptor positive ([place]) C50.411    Z17.0   2. HLD (hyperlipidemia) E78.5 pravastatin (PRAVACHOL) 40 MG tablet    SUMMARY OF ONCOLOGIC HISTORY:   Malignant neoplasm of upper-outer quadrant of right breast in female, estrogen receptor positive ([place])   03/18/2018 Initial Diagnosis    Screening detected bilateral breast abnormalities.  Right breast mass UOQ 2.1 cm at 10 o'clock position: Grade 1 ILC ER 70%, PR 0%, HER-2 +3+ by IHC, Ki-67 5%, suspected lymph node in the axilla could not be biopsied; left breast calcifications by ultrasound not visible but 5 cm cystic/tubular structure which on biopsy was fibrocystic change with ADHl; T2N0 stage IIa clinical stage    05/01/2018 Surgery    Bilateral mastectomies: Left mastectomy: DCIS intermediate grade 0.9 cm margins negative Tis NX stage 0; right mastectomy: ILC, grade 3, 3.5 cm, with LCIS, perineural invasion present, margins negative, 15/22 lymph nodes positive with extracapsular extension, ER 90%, PR 0%, HER-2 +(IHC 3+), Ki-67 5%, T2N3 Stage 3B     05/11/2018 Cancer Staging    Staging form: Breast, AJCC 8th Edition - Pathologic stage from 05/11/2018: Stage IIIB (pT2, pN3, cM0, G3, ER+, PR-, HER2+) - Signed by Nicholas Lose, MD on 05/11/2018    06/03/2018 Imaging    Bone scan: Increased tracer uptake in the lumbar spine at L1, L3 and L4, right fourth and seventh ribs, inferior right scapula and distal sternum, T7 and T11 as well.   CT CAP showed widespread bone metastases with possible pathologic fracture at L3, peripheral right  upper lobe nodules nonspecific    06/10/2018 -  Chemotherapy    Taxol Kingenti and Perjeta      CHIEF COMPLIANT: Cycle 12 Taxol   INTERVAL HISTORY: [name] is a 53 y.o. with above-mentioned history of metastatic breast cancer currently on treatment with weekly Taxol and Kingenti Perjeta every 3 weeks. She presents to the clinic today for cycle 12.  She is doing extremely well from a chemotherapy standpoint.  Has very minimal neuropathy.  Denies any nausea or vomiting.  Diarrhea was well controlled with Imodium.  REVIEW OF SYSTEMS:   Constitutional: Denies fevers, chills or abnormal weight loss Eyes: Denies blurriness of vision Ears, nose, mouth, throat, and face: Denies mucositis or sore throat Respiratory: Denies cough, dyspnea or wheezes Cardiovascular: Denies palpitation, chest discomfort Gastrointestinal: Diarrhea controlled with Imodium did alert her sister Skin: Denies abnormal skin rashes Lymphatics: Denies new lymphadenopathy or easy bruising Neurological: Denies numbness, tingling or new weaknesses Behavioral/Psych: Mood is stable, no new changes  Extremities: No lower extremity edema Breast: denies any pain or lumps or nodules in either breasts All other systems were reviewed with the patient and are negative.  I have reviewed the past medical history, past surgical history, social history and family history with the patient and they are unchanged from previous note.  ALLERGIES:  is allergic to hydrocodone.  MEDICATIONS:  Current Outpatient Medications  Medication Sig Dispense Refill  . diphenoxylate-atropine (LOMOTIL) 2.5-0.025 MG tablet 1 to 2 tablets 4 times daily prn diarrhea 40 tablet 2  . ibuprofen (ADVIL,MOTRIN) 600 MG tablet Take 1 tablet (600  mg total) by mouth every 6 (six) hours as needed (for mild pain not relieved by other medications.). 30 tablet 0  . lisinopril (PRINIVIL,ZESTRIL) 40 MG tablet Take 1 tablet (40 mg total) by mouth daily. 90 tablet 1  .  megestrol (MEGACE) 40 MG tablet Take 3 tablets every day (Patient taking differently: Take 60 mg by mouth 2 (two) times daily with a meal. Take 3 tablets every day) 270 tablet 11  . ondansetron (ZOFRAN-ODT) 4 MG disintegrating tablet Take 1 tablet (4 mg total) by mouth every 6 (six) hours as needed for nausea. 15 tablet 0  . pantoprazole (PROTONIX) 40 MG tablet TAKE 1 TABLET BY MOUTH 30 MINUTES PRIOR TO BREAKFAST AND SUPPER (Patient taking differently: Take 40 mg by mouth 2 (two) times daily before a meal. TAKE 1 TABLET BY MOUTH 30 MINUTES PRIOR TO BREAKFAST AND SUPPER) 180 tablet 1  . polyethylene glycol (MIRALAX / GLYCOLAX) packet Take 17 g by mouth daily as needed for moderate constipation.    . potassium chloride (K-DUR) 10 MEQ tablet TAKE 1 TABLET BY MOUTH EVERY DAY 30 tablet 0  . pravastatin (PRAVACHOL) 40 MG tablet Take 1 tablet (40 mg total) by mouth daily. (Needs to be seen before next refill) 90 tablet 3  . tamoxifen (NOLVADEX) 20 MG tablet Take 1 tablet (20 mg total) by mouth daily. 90 tablet 3   No current facility-administered medications for this visit.     PHYSICAL EXAMINATION: ECOG PERFORMANCE STATUS: 2 - Symptomatic, <50% confined to bed  Vitals:   08/26/18 1241  BP: (!) 147/116  Pulse: (!) 108  Resp: 17  Temp: 98 F (36.7 C)  SpO2: 97%   Filed Weights   08/26/18 1241  Weight: 124 lb (56.2 kg)    GENERAL: alert, no distress and comfortable SKIN: skin color, texture, turgor are normal, no rashes or significant lesions EYES: normal, Conjunctiva are pink and non-injected, sclera clear OROPHARYNX: no exudate, no erythema and lips, buccal mucosa, and tongue normal  NECK: supple, thyroid normal size, non-tender, without nodularity LYMPH: no palpable lymphadenopathy in the cervical, axillary or inguinal LUNGS: clear to auscultation and percussion with normal breathing effort HEART: regular rate & rhythm and no murmurs and no lower extremity edema ABDOMEN: abdomen soft,  non-tender and normal bowel sounds MUSCULOSKELETAL: no cyanosis of digits and no clubbing  NEURO: alert & oriented x 3 with fluent speech, no focal motor/sensory deficits EXTREMITIES: No lower extremity edema  LABORATORY DATA:  I have reviewed the data as listed CMP Latest Ref Rng & Units 08/19/2018 08/12/2018 08/05/2018  Glucose 70 - 99 mg/dL 129(H) 109(H) 102(H)  BUN 6 - 20 mg/dL 21(H) 16 15  Creatinine 0.44 - 1.00 mg/dL 0.71 0.70 0.62  Sodium 135 - 145 mmol/L 142 142 143  Potassium 3.5 - 5.1 mmol/L 3.7 3.9 3.7  Chloride 98 - 111 mmol/L 107 108 109  CO2 22 - 32 mmol/L _0 Calcium 8.9 - 10.3 mg/dL 9.4 9.5 9.3  Total Protein 6.5 - 8.1 g/dL 7.6 7.7 7.7  Total Bilirubin 0.3 - 1.2 mg/dL 0.2(L) 0.2(L) 0.2(L)  Alkaline Phos 38 - 126 U/L 90 90 97  AST 15 - 41 U/L 13(L) 16 16  ALT 0 - 44 U/L 47(H) 28 28    Lab Results  Component Value Date   WBC 17.3 (H) 08/26/2018   HGB 11.4 (L) 08/26/2018   HCT 35.1 (L) 08/26/2018   MCV 96.2 08/26/2018   PLT 373 08/26/2018  NEUTROABS 13.6 (H) 08/26/2018    ASSESSMENT & PLAN:  Malignant neoplasm of upper-outer quadrant of right breast in female, estrogen receptor positive ([place]) 05/01/18:Bilateral mastectomies: Left mastectomy: DCIS intermediate grade 0.9 cm margins negative Tis NX stage 0; right mastectomy: ILC, grade 3, 3.5 cm, with LCIS, perineural invasion present, margins negative, 15/22 lymph nodes positive with extracapsular extension, ER 90%, PR 0%, HER-2 +(IHC 3+), Ki-67 5%, T2N3M1 Stage IV  06/03/2018 bone scan: Increased tracer uptake in the lumbar spine at L1, L3 and L4, right fourth and seventh ribs, inferior right scapula and distal sternum, T7 and T11 as well.  CT CAPshowed widespread bone metastases with possible pathologic fracture at L3, peripheral right upper lobe nodules nonspecific Patient has cerebral palsy and hersisteris the power of attorney for healthcare Treatment plan: Palliative treatment with Taxol,Kingenti,  Perjeta ------------------------------------------------------------------------------------------------------------------------ Current treatment: Cycle12day1TaxolKingentiand Perjeta Echo3/13/2020shows EF of 55-60%. Chemo toxicities: 1.Diarrhea--resolved with imodium Denies neuropathy Monitoring labs Monitoring closely for chemo toxicities.  Today she finishes 12 cycles of Taxol then she will go on Kingenti maintenance.  RTC in 3 weeks to discuss the results of the PET scan and to start Maintenance therapy along with anti-estrogen therapy. Tamoxifen counseling:We discussed the risks and benefits of tamoxifen. These include but not limited to insomnia, hot flashes, mood changes, vaginal dryness, and weight gain. Although rare, serious side effects including endometrial cancer, risk of blood clots were also discussed. We strongly believe that the benefits far outweigh the risks. Patient understands these risks and consented to starting treatment.    Because of coronavirus, I recommended waiting at least 2 months before obtaining the PET scan. she will continue Kingenti maintenance along with anastrozole. Patient will come back in 3 weeks for infusion and I will see her back in 6 weeks.   No orders of the defined types were placed in this encounter.  The patient has a good understanding of the overall plan. she agrees with it. she will call with any problems that may develop before the next visit here.  Nicholas Lose, MD 08/26/2018  Julious Oka Dorshimer am acting as scribe for Dr. Nicholas Lose.  I have reviewed the above documentation for accuracy and completeness, and I agree with the above.

## 2018-08-26 ENCOUNTER — Inpatient Hospital Stay: Payer: Medicare Other

## 2018-08-26 ENCOUNTER — Other Ambulatory Visit: Payer: Self-pay

## 2018-08-26 ENCOUNTER — Other Ambulatory Visit: Payer: Self-pay | Admitting: *Deleted

## 2018-08-26 ENCOUNTER — Inpatient Hospital Stay (HOSPITAL_BASED_OUTPATIENT_CLINIC_OR_DEPARTMENT_OTHER): Payer: Medicare Other | Admitting: Hematology and Oncology

## 2018-08-26 ENCOUNTER — Telehealth: Payer: Self-pay | Admitting: *Deleted

## 2018-08-26 VITALS — BP 147/116 | HR 108 | Temp 98.0°F | Resp 17 | Ht 62.0 in | Wt 124.0 lb

## 2018-08-26 VITALS — BP 170/88 | HR 108

## 2018-08-26 DIAGNOSIS — Z79899 Other long term (current) drug therapy: Secondary | ICD-10-CM | POA: Diagnosis not present

## 2018-08-26 DIAGNOSIS — Z170 Estrogen receptor positive status [ER+]: Secondary | ICD-10-CM | POA: Diagnosis not present

## 2018-08-26 DIAGNOSIS — Z9013 Acquired absence of bilateral breasts and nipples: Secondary | ICD-10-CM | POA: Diagnosis not present

## 2018-08-26 DIAGNOSIS — E785 Hyperlipidemia, unspecified: Secondary | ICD-10-CM

## 2018-08-26 DIAGNOSIS — C50411 Malignant neoplasm of upper-outer quadrant of right female breast: Secondary | ICD-10-CM

## 2018-08-26 DIAGNOSIS — Z95828 Presence of other vascular implants and grafts: Secondary | ICD-10-CM

## 2018-08-26 DIAGNOSIS — Z5111 Encounter for antineoplastic chemotherapy: Secondary | ICD-10-CM

## 2018-08-26 DIAGNOSIS — C7951 Secondary malignant neoplasm of bone: Secondary | ICD-10-CM

## 2018-08-26 DIAGNOSIS — G629 Polyneuropathy, unspecified: Secondary | ICD-10-CM

## 2018-08-26 DIAGNOSIS — Z5112 Encounter for antineoplastic immunotherapy: Secondary | ICD-10-CM

## 2018-08-26 DIAGNOSIS — G809 Cerebral palsy, unspecified: Secondary | ICD-10-CM

## 2018-08-26 DIAGNOSIS — Z7189 Other specified counseling: Secondary | ICD-10-CM

## 2018-08-26 LAB — CMP (CANCER CENTER ONLY)
ALT: 27 U/L (ref 0–44)
AST: 12 U/L — ABNORMAL LOW (ref 15–41)
Albumin: 3.5 g/dL (ref 3.5–5.0)
Alkaline Phosphatase: 76 U/L (ref 38–126)
Anion gap: 13 (ref 5–15)
BUN: 13 mg/dL (ref 6–20)
CO2: 22 mmol/L (ref 22–32)
Calcium: 9 mg/dL (ref 8.9–10.3)
Chloride: 103 mmol/L (ref 98–111)
Creatinine: 0.62 mg/dL (ref 0.44–1.00)
GFR, Est AFR Am: >60 mL/min (ref >60)
GFR, Estimated: >60 mL/min (ref >60)
Glucose, Bld: 103 mg/dL — ABNORMAL HIGH (ref 70–99)
Potassium: 3.5 mmol/L (ref 3.5–5.1)
Sodium: 138 mmol/L (ref 135–145)
Total Bilirubin: 0.6 mg/dL (ref 0.3–1.2)
Total Protein: 7.2 g/dL (ref 6.5–8.1)

## 2018-08-26 LAB — CBC WITH DIFFERENTIAL (CANCER CENTER ONLY)
Abs Immature Granulocytes: 0.32 10*3/uL — ABNORMAL HIGH (ref 0.00–0.07)
Basophils Absolute: 0.1 10*3/uL (ref 0.0–0.1)
Basophils Relative: 0 %
Eosinophils Absolute: 0.1 10*3/uL (ref 0.0–0.5)
Eosinophils Relative: 1 %
HCT: 35.1 % — ABNORMAL LOW (ref 36.0–46.0)
Hemoglobin: 11.4 g/dL — ABNORMAL LOW (ref 12.0–15.0)
Immature Granulocytes: 2 %
Lymphocytes Relative: 12 %
Lymphs Abs: 2.1 10*3/uL (ref 0.7–4.0)
MCH: 31.2 pg (ref 26.0–34.0)
MCHC: 32.5 g/dL (ref 30.0–36.0)
MCV: 96.2 fL (ref 80.0–100.0)
Monocytes Absolute: 1.1 10*3/uL — ABNORMAL HIGH (ref 0.1–1.0)
Monocytes Relative: 6 %
Neutro Abs: 13.6 10*3/uL — ABNORMAL HIGH (ref 1.7–7.7)
Neutrophils Relative %: 79 %
Platelet Count: 373 10*3/uL (ref 150–400)
RBC: 3.65 MIL/uL — ABNORMAL LOW (ref 3.87–5.11)
RDW: 15.8 % — ABNORMAL HIGH (ref 11.5–15.5)
WBC Count: 17.3 10*3/uL — ABNORMAL HIGH (ref 4.0–10.5)
nRBC: 0 % (ref 0.0–0.2)

## 2018-08-26 MED ORDER — TAMOXIFEN CITRATE 20 MG PO TABS: 20.0000 mg | ORAL_TABLET | Freq: Every day | ORAL | 3 refills | Status: DC

## 2018-08-26 MED ORDER — DEXAMETHASONE SODIUM PHOSPHATE 10 MG/ML IJ SOLN
INTRAMUSCULAR | Status: AC
  Filled 2018-08-26: qty 1

## 2018-08-26 MED ORDER — SODIUM CHLORIDE 0.9 % IV SOLN
20.0000 mg | Freq: Once | INTRAVENOUS | Status: AC
  Administered 2018-08-26: 20 mg via INTRAVENOUS
  Filled 2018-08-26: qty 2

## 2018-08-26 MED ORDER — DEXAMETHASONE SODIUM PHOSPHATE 10 MG/ML IJ SOLN
10.0000 mg | Freq: Once | INTRAMUSCULAR | Status: AC
  Administered 2018-08-26: 10 mg via INTRAVENOUS

## 2018-08-26 MED ORDER — PRAVASTATIN SODIUM 40 MG PO TABS: 40.0000 mg | ORAL_TABLET | Freq: Every day | ORAL | 0 refills | Status: DC

## 2018-08-26 MED ORDER — SODIUM CHLORIDE 0.9 % IV SOLN
60.0000 mg/m2 | Freq: Once | INTRAVENOUS | Status: AC
  Administered 2018-08-26: 96 mg via INTRAVENOUS
  Filled 2018-08-26: qty 16

## 2018-08-26 MED ORDER — SODIUM CHLORIDE 0.9% FLUSH
10.0000 mL | INTRAVENOUS | Status: DC | PRN
  Administered 2018-08-26: 12:00:00 10 mL
  Filled 2018-08-26: qty 10

## 2018-08-26 MED ORDER — DIPHENHYDRAMINE HCL 25 MG PO CAPS
ORAL_CAPSULE | ORAL | Status: AC
  Filled 2018-08-26: qty 1

## 2018-08-26 MED ORDER — SODIUM CHLORIDE 0.9% FLUSH
10.0000 mL | INTRAVENOUS | Status: DC | PRN
  Administered 2018-08-26: 10 mL
  Filled 2018-08-26: qty 10

## 2018-08-26 MED ORDER — HEPARIN SOD (PORK) LOCK FLUSH 100 UNIT/ML IV SOLN
500.0000 [IU] | Freq: Once | INTRAVENOUS | Status: AC | PRN
  Administered 2018-08-26: 500 [IU]
  Filled 2018-08-26: qty 5

## 2018-08-26 MED ORDER — DIPHENHYDRAMINE HCL 50 MG/ML IJ SOLN
25.0000 mg | Freq: Once | INTRAMUSCULAR | Status: AC
  Administered 2018-08-26: 25 mg via INTRAVENOUS

## 2018-08-26 MED ORDER — PRAVASTATIN SODIUM 40 MG PO TABS: 40.0000 mg | ORAL_TABLET | Freq: Every day | ORAL | 3 refills | Status: DC

## 2018-08-26 MED ORDER — SODIUM CHLORIDE 0.9 % IV SOLN
Freq: Once | INTRAVENOUS | Status: AC
  Administered 2018-08-26: 14:00:00 via INTRAVENOUS
  Filled 2018-08-26: qty 250

## 2018-08-26 MED ORDER — DIPHENHYDRAMINE HCL 50 MG/ML IJ SOLN
INTRAMUSCULAR | Status: AC
  Filled 2018-08-26: qty 1

## 2018-08-26 NOTE — Progress Notes (Signed)
Per Dr. Lindi Adie okay to treat with HR 108 and BP 170/88

## 2018-08-26 NOTE — Telephone Encounter (Signed)
Spoke to pt sister, relate pt doing well. Gave congratulations on pt completing final Taxol today. Denies questions at this time. Encourage sister to call with pt needs.

## 2018-08-26 NOTE — Patient Instructions (Signed)
[facility] Discharge Instructions for Patients Receiving Chemotherapy  Today you received the following chemotherapy agents Paclitaxel (TAXOL).  To help prevent nausea and vomiting after your treatment, we encourage you to take your nausea medication as prescribed.  If you develop nausea and vomiting that is not controlled by your nausea medication, call the clinic.   BELOW ARE SYMPTOMS THAT SHOULD BE REPORTED IMMEDIATELY:  *FEVER GREATER THAN 100.5 F  *CHILLS WITH OR WITHOUT FEVER  NAUSEA AND VOMITING THAT IS NOT CONTROLLED WITH YOUR NAUSEA MEDICATION  *UNUSUAL SHORTNESS OF BREATH  *UNUSUAL BRUISING OR BLEEDING  TENDERNESS IN MOUTH AND THROAT WITH OR WITHOUT PRESENCE OF ULCERS  *URINARY PROBLEMS  *BOWEL PROBLEMS  UNUSUAL RASH Items with * indicate a potential emergency and should be followed up as soon as possible.  Feel free to call the clinic should you have any questions or concerns. The clinic phone number is [phone].  Please show the CHEMO ALERT CARD at check-in to the Emergency Department and triage nurse.  Coronavirus (COVID-19) Are you at risk?  Are you at risk for the Coronavirus (COVID-19)?  To be considered HIGH RISK for Coronavirus (COVID-19), you have to meet the following criteria:  . Traveled to [place], [place], [place], [place] or [place]; or in the [place] to [place], [place], [place], or [place]; and have fever, cough, and shortness of breath within the last 2 weeks of travel OR . Been in close contact with a person diagnosed with COVID-19 within the last 2 weeks and have fever, cough, and shortness of breath . IF YOU DO NOT MEET THESE CRITERIA, YOU ARE CONSIDERED LOW RISK FOR COVID-19.  What to do if you are HIGH RISK for COVID-19?  . If you are having a medical emergency, call 911. . Seek medical care right away. Before you go to a doctor's office, urgent care or emergency department, call ahead and tell them about  your recent travel, contact with someone diagnosed with COVID-19, and your symptoms. You should receive instructions from your physician's office regarding next steps of care.  . When you arrive at healthcare provider, tell the healthcare staff immediately you have returned from visiting [place], [place], [place], [place] or [place]; or traveled in the [place] to [place], [place], [place], or [place]; in the last two weeks or you have been in close contact with a person diagnosed with COVID-19 in the last 2 weeks.   . Tell the health care staff about your symptoms: fever, cough and shortness of breath. . After you have been seen by a medical provider, you will be either: o Tested for (COVID-19) and discharged home on quarantine except to seek medical care if symptoms worsen, and asked to  - Stay home and avoid contact with others until you get your results (4-5 days)  - Avoid travel on public transportation if possible (such as bus, train, or airplane) or o Sent to the Emergency Department by EMS for evaluation, COVID-19 testing, and possible admission depending on your condition and test results.  What to do if you are LOW RISK for COVID-19?  Reduce your risk of any infection by using the same precautions used for avoiding the common cold or flu:  . Wash your hands often with soap and warm water for at least 20 seconds.  If soap and water are not readily available, use an alcohol-based hand sanitizer with at least 60% alcohol.  . If coughing or sneezing,   cover your mouth and nose by coughing or sneezing into the elbow areas of your shirt or coat, into a tissue or into your sleeve (not your hands). . Avoid shaking hands with others and consider head nods or verbal greetings only. . Avoid touching your eyes, nose, or mouth with unwashed hands.  . Avoid close contact with people who are sick. . Avoid places or events with large numbers of people in one location, like concerts or sporting  events. . Carefully consider travel plans you have or are making. . If you are planning any travel outside or inside the [place], visit the CDC's Travelers' Health webpage for the latest health notices. . If you have some symptoms but not all symptoms, continue to monitor at home and seek medical attention if your symptoms worsen. . If you are having a medical emergency, call 911.   ADDITIONAL HEALTHCARE OPTIONS FOR PATIENTS  [facility] Tele[facility] / e-Visit: [email or web address]         [facility]: [phone]  [facility]: [phone]                   [facility]: [phone]     

## 2018-08-26 NOTE — Assessment & Plan Note (Addendum)
05/01/18:Bilateral mastectomies: Left mastectomy: DCIS intermediate grade 0.9 cm margins negative Tis NX stage 0; right mastectomy: ILC, grade 3, 3.5 cm, with LCIS, perineural invasion present, margins negative, 15/22 lymph nodes positive with extracapsular extension, ER 90%, PR 0%, HER-2 +(IHC 3+), Ki-67 5%, T2N3M1 Stage IV  06/03/2018 bone scan: Increased tracer uptake in the lumbar spine at L1, L3 and L4, right fourth and seventh ribs, inferior right scapula and distal sternum, T7 and T11 as well.  CT CAPshowed widespread bone metastases with possible pathologic fracture at L3, peripheral right upper lobe nodules nonspecific Patient has cerebral palsy and hersisteris the power of attorney for healthcare Treatment plan: Palliative treatment with Taxol,Kingenti, Perjeta ------------------------------------------------------------------------------------------------------------------------ Current treatment: Cycle12day1TaxolKingentiand Perjeta Echo3/13/2020shows EF of 55-60%. Chemo toxicities: 1.Diarrhea--resolved with imodium Denies neuropathy Monitoring labs Monitoring closely for chemo toxicities.  Today she finishes 12 cycles of Taxol then she will go on Kingenti and Perjeta maintenance vs Herceptin alone maintenance. We will obtain a PET scan to assess treatment response.  RTC in 3 weeks to discuss the results of the PET scan and to start Maintenance therapy along with anti-estrogen therapy

## 2018-08-27 ENCOUNTER — Telehealth: Payer: Self-pay | Admitting: Hematology and Oncology

## 2018-08-27 NOTE — Telephone Encounter (Signed)
Spoke with guardian re next appointment for 5/6. Patient to get updated schedule at 5/6 visit.

## 2018-08-31 ENCOUNTER — Encounter: Payer: Self-pay | Admitting: *Deleted

## 2018-09-02 ENCOUNTER — Other Ambulatory Visit: Payer: Medicare Other

## 2018-09-02 ENCOUNTER — Other Ambulatory Visit: Payer: Self-pay | Admitting: Hematology and Oncology

## 2018-09-02 ENCOUNTER — Ambulatory Visit: Payer: Medicare Other | Admitting: Hematology and Oncology

## 2018-09-02 ENCOUNTER — Ambulatory Visit: Payer: Medicare Other

## 2018-09-07 ENCOUNTER — Telehealth: Payer: Self-pay | Admitting: Family Medicine

## 2018-09-07 MED ORDER — PANTOPRAZOLE SODIUM 40 MG PO TBEC: DELAYED_RELEASE_TABLET | ORAL | 0 refills | Status: DC

## 2018-09-07 NOTE — Telephone Encounter (Signed)
Called sister and sent med

## 2018-09-07 NOTE — Telephone Encounter (Signed)
PT sister is calling states that pharmacy has sent over request pharmacy hasn't heard anything (didn't see anything in epic) pt is needing a refill on pantoprazole (PROTONIX) 40 MG tablet [facility] [place]

## 2018-09-08 ENCOUNTER — Other Ambulatory Visit: Payer: Self-pay

## 2018-09-14 ENCOUNTER — Other Ambulatory Visit: Payer: Self-pay | Admitting: Hematology and Oncology

## 2018-09-14 DIAGNOSIS — Z7189 Other specified counseling: Secondary | ICD-10-CM | POA: Insufficient documentation

## 2018-09-14 NOTE — Addendum Note (Signed)
Addended by: Nicholas Lose on: 09/14/2018 03:08 PM   Modules accepted: Orders

## 2018-09-16 ENCOUNTER — Other Ambulatory Visit: Payer: Self-pay

## 2018-09-16 ENCOUNTER — Inpatient Hospital Stay: Payer: Medicare Other

## 2018-09-16 ENCOUNTER — Other Ambulatory Visit: Payer: Self-pay | Admitting: *Deleted

## 2018-09-16 ENCOUNTER — Inpatient Hospital Stay: Payer: Medicare Other | Attending: Hematology and Oncology

## 2018-09-16 VITALS — BP 111/92 | HR 78 | Temp 98.8°F | Resp 19

## 2018-09-16 DIAGNOSIS — Z5112 Encounter for antineoplastic immunotherapy: Secondary | ICD-10-CM | POA: Insufficient documentation

## 2018-09-16 DIAGNOSIS — Z170 Estrogen receptor positive status [ER+]: Principal | ICD-10-CM

## 2018-09-16 DIAGNOSIS — C7951 Secondary malignant neoplasm of bone: Secondary | ICD-10-CM | POA: Insufficient documentation

## 2018-09-16 DIAGNOSIS — C50411 Malignant neoplasm of upper-outer quadrant of right female breast: Secondary | ICD-10-CM | POA: Diagnosis not present

## 2018-09-16 DIAGNOSIS — Z79899 Other long term (current) drug therapy: Secondary | ICD-10-CM | POA: Insufficient documentation

## 2018-09-16 DIAGNOSIS — Z9013 Acquired absence of bilateral breasts and nipples: Secondary | ICD-10-CM | POA: Insufficient documentation

## 2018-09-16 DIAGNOSIS — Z95828 Presence of other vascular implants and grafts: Secondary | ICD-10-CM

## 2018-09-16 DIAGNOSIS — Z7189 Other specified counseling: Secondary | ICD-10-CM

## 2018-09-16 LAB — CBC WITH DIFFERENTIAL (CANCER CENTER ONLY)
Abs Immature Granulocytes: 0.03 10*3/uL (ref 0.00–0.07)
Basophils Absolute: 0.1 10*3/uL (ref 0.0–0.1)
Basophils Relative: 1 %
Eosinophils Absolute: 0.1 10*3/uL (ref 0.0–0.5)
Eosinophils Relative: 1 %
HCT: 34.8 % — ABNORMAL LOW (ref 36.0–46.0)
Hemoglobin: 11.4 g/dL — ABNORMAL LOW (ref 12.0–15.0)
Immature Granulocytes: 0 %
Lymphocytes Relative: 26 %
Lymphs Abs: 2.4 10*3/uL (ref 0.7–4.0)
MCH: 30.8 pg (ref 26.0–34.0)
MCHC: 32.8 g/dL (ref 30.0–36.0)
MCV: 94.1 fL (ref 80.0–100.0)
Monocytes Absolute: 0.7 10*3/uL (ref 0.1–1.0)
Monocytes Relative: 7 %
Neutro Abs: 5.8 10*3/uL (ref 1.7–7.7)
Neutrophils Relative %: 65 %
Platelet Count: 417 10*3/uL — ABNORMAL HIGH (ref 150–400)
RBC: 3.7 MIL/uL — ABNORMAL LOW (ref 3.87–5.11)
RDW: 14.6 % (ref 11.5–15.5)
WBC Count: 9 10*3/uL (ref 4.0–10.5)
nRBC: 0 % (ref 0.0–0.2)

## 2018-09-16 LAB — CMP (CANCER CENTER ONLY)
ALT: 20 U/L (ref 0–44)
AST: 14 U/L — ABNORMAL LOW (ref 15–41)
Albumin: 3.6 g/dL (ref 3.5–5.0)
Alkaline Phosphatase: 76 U/L (ref 38–126)
Anion gap: 10 (ref 5–15)
BUN: 15 mg/dL (ref 6–20)
CO2: 23 mmol/L (ref 22–32)
Calcium: 8.9 mg/dL (ref 8.9–10.3)
Chloride: 111 mmol/L (ref 98–111)
Creatinine: 0.64 mg/dL (ref 0.44–1.00)
GFR, Est AFR Am: >60 mL/min (ref >60)
GFR, Estimated: >60 mL/min (ref >60)
Glucose, Bld: 112 mg/dL — ABNORMAL HIGH (ref 70–99)
Potassium: 3.8 mmol/L (ref 3.5–5.1)
Sodium: 144 mmol/L (ref 135–145)
Total Bilirubin: <0.2 mg/dL — ABNORMAL LOW (ref 0.3–1.2)
Total Protein: 7.4 g/dL (ref 6.5–8.1)

## 2018-09-16 MED ORDER — ACETAMINOPHEN 325 MG PO TABS
650.0000 mg | ORAL_TABLET | Freq: Once | ORAL | Status: AC
  Administered 2018-09-16: 650 mg via ORAL

## 2018-09-16 MED ORDER — HEPARIN SOD (PORK) LOCK FLUSH 100 UNIT/ML IV SOLN
500.0000 [IU] | Freq: Once | INTRAVENOUS | Status: AC | PRN
  Administered 2018-09-16: 500 [IU]
  Filled 2018-09-16: qty 5

## 2018-09-16 MED ORDER — SODIUM CHLORIDE 0.9 % IV SOLN
420.0000 mg | Freq: Once | INTRAVENOUS | Status: AC
  Administered 2018-09-16: 420 mg via INTRAVENOUS
  Filled 2018-09-16: qty 14

## 2018-09-16 MED ORDER — DIPHENHYDRAMINE HCL 25 MG PO CAPS
ORAL_CAPSULE | ORAL | Status: AC
  Filled 2018-09-16: qty 2

## 2018-09-16 MED ORDER — DIPHENHYDRAMINE HCL 25 MG PO CAPS
50.0000 mg | ORAL_CAPSULE | Freq: Once | ORAL | Status: AC
  Administered 2018-09-16: 50 mg via ORAL

## 2018-09-16 MED ORDER — SODIUM CHLORIDE 0.9 % IV SOLN
Freq: Once | INTRAVENOUS | Status: AC
  Administered 2018-09-16: 15:00:00 via INTRAVENOUS
  Filled 2018-09-16: qty 250

## 2018-09-16 MED ORDER — ACETAMINOPHEN 325 MG PO TABS
ORAL_TABLET | ORAL | Status: AC
  Filled 2018-09-16: qty 1

## 2018-09-16 MED ORDER — SODIUM CHLORIDE 0.9% FLUSH
10.0000 mL | INTRAVENOUS | Status: DC | PRN
  Administered 2018-09-16: 10 mL
  Filled 2018-09-16: qty 10

## 2018-09-16 MED ORDER — ACETAMINOPHEN 325 MG PO TABS
ORAL_TABLET | ORAL | Status: AC
  Filled 2018-09-16: qty 2

## 2018-09-16 MED ORDER — TRASTUZUMAB CHEMO 150 MG IV SOLR
6.0000 mg/kg | Freq: Once | INTRAVENOUS | Status: AC
  Administered 2018-09-16: 336 mg via INTRAVENOUS
  Filled 2018-09-16: qty 16

## 2018-09-16 NOTE — Patient Instructions (Signed)
[facility] Discharge Instructions for Patients Receiving Chemotherapy  Today you received the following immunotherapy agents :  Herceptin & Perjeta  To help prevent nausea and vomiting after your treatment, we encourage you to take your nausea medication if needed.    If you develop nausea and vomiting that is not controlled by your nausea medication, call the clinic.   BELOW ARE SYMPTOMS THAT SHOULD BE REPORTED IMMEDIATELY:  *FEVER GREATER THAN 100.5 F  *CHILLS WITH OR WITHOUT FEVER  NAUSEA AND VOMITING THAT IS NOT CONTROLLED WITH YOUR NAUSEA MEDICATION  *UNUSUAL SHORTNESS OF BREATH  *UNUSUAL BRUISING OR BLEEDING  TENDERNESS IN MOUTH AND THROAT WITH OR WITHOUT PRESENCE OF ULCERS  *URINARY PROBLEMS  *BOWEL PROBLEMS  UNUSUAL RASH Items with * indicate a potential emergency and should be followed up as soon as possible.  Feel free to call the clinic should you have any questions or concerns. The clinic phone number is [phone].  Please show the [place] at check-in to the Emergency Department and triage nurse.

## 2018-09-18 ENCOUNTER — Other Ambulatory Visit: Payer: Self-pay | Admitting: Obstetrics & Gynecology

## 2018-09-18 ENCOUNTER — Other Ambulatory Visit: Payer: Self-pay | Admitting: Family

## 2018-09-23 ENCOUNTER — Ambulatory Visit (INDEPENDENT_AMBULATORY_CARE_PROVIDER_SITE_OTHER): Payer: Medicare Other | Admitting: Family Medicine

## 2018-09-23 ENCOUNTER — Encounter: Payer: Self-pay | Admitting: Family Medicine

## 2018-09-23 ENCOUNTER — Other Ambulatory Visit: Payer: Self-pay

## 2018-09-23 VITALS — BP 111/90 | HR 68 | Temp 98.2°F

## 2018-09-23 DIAGNOSIS — I10 Essential (primary) hypertension: Secondary | ICD-10-CM | POA: Diagnosis not present

## 2018-09-23 DIAGNOSIS — K219 Gastro-esophageal reflux disease without esophagitis: Secondary | ICD-10-CM

## 2018-09-23 DIAGNOSIS — C50911 Malignant neoplasm of unspecified site of right female breast: Secondary | ICD-10-CM

## 2018-09-23 MED ORDER — PANTOPRAZOLE SODIUM 40 MG PO TBEC: DELAYED_RELEASE_TABLET | ORAL | 3 refills | Status: DC

## 2018-09-23 MED ORDER — LISINOPRIL 40 MG PO TABS: 40.0000 mg | ORAL_TABLET | Freq: Every day | ORAL | 3 refills | Status: DC

## 2018-09-23 MED ORDER — LORAZEPAM 0.5 MG PO TABS: ORAL_TABLET | ORAL | 0 refills | Status: DC

## 2018-09-23 NOTE — Progress Notes (Signed)
Telephone visit  Subjective: CC: HTN, breast cancer PCP: Janora Norlander, DO LEX:[name] Remmert is a 53 y.o. female calls for telephone consult today. Patient provides verbal consent for consult held via phone.  Location of patient: home Location of provider: [facility] Others present for call: sister (Evette)  1.  Hypertension Patient is compliant with her lisinopril daily.  Her medications are provided to her by her sister, Dewaine Oats who is her primary caregiver and power of attorney.  She is out of the medicine and in need of refills.  She had a recent renal function panel done with her oncologist which was within her normal range.  She has not complained of any chest pain or shortness of breath.  2.  Breast cancer Patient currently undergoing treatment for a right sided breast cancer noted on mammogram last year.  She is seeing Dr. Lindi Adie for this.  Appetite has been fine.  She has had some diarrhea as a result of treatments and lost her hair which she is not pleased about.  Otherwise she seems to be tolerating the medicine and treatment without difficulty.  They have a scan planned for June.  The Ativan that was given at previous imaging studies seem to be helpful.   ROS: Per HPI  Allergies  Allergen Reactions  . Hydrocodone Nausea And Vomiting and Other (See Comments)    Vomiting and hallunications   Past Medical History:  Diagnosis Date  . Arthritis   . Cerebral palsy ([place])   . Complication of anesthesia 12/09/2013   Respiratory instability with 10 mg Propofol.Poor gag reflex.  . Constipation - functional   . Dysphagia 2003   2O to GERD/HH, ?difficulty with large food bolus due to neuromuscular weakness  . Epigastric pain MAY 2012 ABD [place] NL GBLIVPAN   2o to GERD V. NON-ULCER DYSPEPSIA  . GERD (gastroesophageal reflux disease) 2007   BPE NL ESO, REFLUX  . Hiatal hernia   . HIATAL HERNIA 11/23/2008   Qualifier: Diagnosis of  By: Zeb Comfort    . HTN (hypertension)   .  Hyperlipidemia     Current Outpatient Medications:  .  diphenoxylate-atropine (LOMOTIL) 2.5-0.025 MG tablet, 1 to 2 tablets 4 times daily prn diarrhea, Disp: 40 tablet, Rfl: 2 .  ibuprofen (ADVIL,MOTRIN) 600 MG tablet, Take 1 tablet (600 mg total) by mouth every 6 (six) hours as needed (for mild pain not relieved by other medications.)., Disp: 30 tablet, Rfl: 0 .  lisinopril (PRINIVIL,ZESTRIL) 40 MG tablet, Take 1 tablet (40 mg total) by mouth daily., Disp: 90 tablet, Rfl: 1 .  megestrol (MEGACE) 40 MG tablet, Take 3 tablets every day (Patient taking differently: Take 60 mg by mouth 2 (two) times daily with a meal. Take 3 tablets every day), Disp: 270 tablet, Rfl: 11 .  ondansetron (ZOFRAN-ODT) 4 MG disintegrating tablet, Take 1 tablet (4 mg total) by mouth every 6 (six) hours as needed for nausea., Disp: 15 tablet, Rfl: 0 .  pantoprazole (PROTONIX) 40 MG tablet, TAKE 1 TABLET BY MOUTH 30 MINUTES PRIOR TO BREAKFAST AND SUPPER, Disp: 180 tablet, Rfl: 0 .  polyethylene glycol (MIRALAX / GLYCOLAX) packet, Take 17 g by mouth daily as needed for moderate constipation., Disp: , Rfl:  .  potassium chloride (K-DUR) 10 MEQ tablet, TAKE 1 TABLET BY MOUTH EVERY DAY, Disp: 30 tablet, Rfl: 0 .  pravastatin (PRAVACHOL) 40 MG tablet, Take 1 tablet (40 mg total) by mouth daily. (Needs to be seen before next refill), Disp: 90  tablet, Rfl: 3 .  tamoxifen (NOLVADEX) 20 MG tablet, Take 1 tablet (20 mg total) by mouth daily., Disp: 90 tablet, Rfl: 3  Assessment/ Plan: 53 y.o. female   1. Essential hypertension Controlled.  Continue lisinopril 40 mg daily.  I reviewed her most recent renal function panel from 6 May which was within normal range. - lisinopril (ZESTRIL) 40 MG tablet; Take 1 tablet (40 mg total) by mouth daily.  Dispense: 90 tablet; Refill: 3  2. Breast cancer, stage 2, right ([place]) Is being followed by Dr. Lindi Adie.  I given a small quantity of Ativan to have on hand to take 30 minutes prior to  scheduled procedures and imaging studies.  National narcotic database was reviewed and there were no red flags - LORazepam (ATIVAN) 0.5 MG tablet; Take 1 tablet 30 minutes prior to scheduled procedure.  May repeat 1 time if needed.  Dispense: 5 tablet; Refill: 0  3. Gastroesophageal reflux disease without esophagitis Controlled. - pantoprazole (PROTONIX) 40 MG tablet; TAKE 1 TABLET BY MOUTH 30 MINUTES PRIOR TO BREAKFAST AND SUPPER  Dispense: 180 tablet; Refill: 3   Start time: 2:29pm End time: 2:40pm  Total time spent on patient care (including telephone call/ virtual visit): 16 minutes  [place], [place] [phone]

## 2018-09-28 ENCOUNTER — Other Ambulatory Visit: Payer: Self-pay | Admitting: Hematology and Oncology

## 2018-10-01 NOTE — Assessment & Plan Note (Signed)
05/01/18:Bilateral mastectomies: Left mastectomy: DCIS intermediate grade 0.9 cm margins negative Tis NX stage 0; right mastectomy: ILC, grade 3, 3.5 cm, with LCIS, perineural invasion present, margins negative, 15/22 lymph nodes positive with extracapsular extension, ER 90%, PR 0%, HER-2 +(IHC 3+), Ki-67 5%, T2N3M1 Stage IV  06/03/2018 bone scan: Increased tracer uptake in the lumbar spine at L1, L3 and L4, right fourth and seventh ribs, inferior right scapula and distal sternum, T7 and T11 as well.  CT CAPshowed widespread bone metastases with possible pathologic fracture at L3, peripheral right upper lobe nodules nonspecific Patient has cerebral palsy and hersisteris the power of attorney for healthcare Treatment plan: Palliative treatment with Taxol x12 cycles completed 08/26/2018,Kingenti, Perjeta ------------------------------------------------------------------------------------------------------------------------ PET CT scan: Current treatment: Kingenti maintenance along with anastrozole Patient will come every 3 weeks for Kingenti and every 9 weeks for follow-up with me with labs.

## 2018-10-06 NOTE — Progress Notes (Signed)
Patient Care Team: Janora Norlander, DO as PCP - General (Family Medicine) Danie Binder, MD (Gastroenterology) Alphonsa Overall, MD as Consulting Physician (General Surgery) Nicholas Lose, MD as Consulting Physician (Hematology and Oncology) Kyung Rudd, MD as Consulting Physician (Radiation Oncology) Mauro Kaufmann, RN as Oncology Nurse Navigator Rockwell Germany, RN as Oncology Nurse Navigator  DIAGNOSIS:    ICD-10-CM   1. Malignant neoplasm of upper-outer quadrant of right breast in female, estrogen receptor positive ([place]) C50.411    Z17.0     SUMMARY OF ONCOLOGIC HISTORY:   Malignant neoplasm of upper-outer quadrant of right breast in female, estrogen receptor positive ([place])   03/18/2018 Initial Diagnosis    Screening detected bilateral breast abnormalities.  Right breast mass UOQ 2.1 cm at 10 o'clock position: Grade 1 ILC ER 70%, PR 0%, HER-2 +3+ by IHC, Ki-67 5%, suspected lymph node in the axilla could not be biopsied; left breast calcifications by ultrasound not visible but 5 cm cystic/tubular structure which on biopsy was fibrocystic change with ADHl; T2N0 stage IIa clinical stage    05/01/2018 Surgery    Bilateral mastectomies: Left mastectomy: DCIS intermediate grade 0.9 cm margins negative Tis NX stage 0; right mastectomy: ILC, grade 3, 3.5 cm, with LCIS, perineural invasion present, margins negative, 15/22 lymph nodes positive with extracapsular extension, ER 90%, PR 0%, HER-2 +(IHC 3+), Ki-67 5%, T2N3 Stage 3B     05/11/2018 Cancer Staging    Staging form: Breast, AJCC 8th Edition - Pathologic stage from 05/11/2018: Stage IIIB (pT2, pN3, cM0, G3, ER+, PR-, HER2+) - Signed by Nicholas Lose, MD on 05/11/2018    06/03/2018 Imaging    Bone scan: Increased tracer uptake in the lumbar spine at L1, L3 and L4, right fourth and seventh ribs, inferior right scapula and distal sternum, T7 and T11 as well.   CT CAP showed widespread bone metastases with possible pathologic  fracture at L3, peripheral right upper lobe nodules nonspecific    06/10/2018 -  Chemotherapy    Taxol Kingenti and Perjeta      CHIEF COMPLIANT: Follow-up of Kingenti maintenance and anastrozole  INTERVAL HISTORY: [name] is a 53 y.o. with above-mentioned history of metastatic breast cancer currently on treatment with maintenance Kingenti and on anti-estrogen therapy with tamoxifen. Her PET scan was postponed due to the COVID-19 pandemic. She presents to the clinic today for follow-up.   REVIEW OF SYSTEMS:   Constitutional: Denies fevers, chills or abnormal weight loss Eyes: Denies blurriness of vision Ears, nose, mouth, throat, and face: Denies mucositis or sore throat Respiratory: Denies cough, dyspnea or wheezes Cardiovascular: Denies palpitation, chest discomfort Gastrointestinal: Denies nausea, heartburn or change in bowel habits Skin: Denies abnormal skin rashes Lymphatics: Denies new lymphadenopathy or easy bruising Neurological: Denies numbness, tingling or new weaknesses Behavioral/Psych: Mood is stable, no new changes  Extremities: No lower extremity edema Breast: denies any pain or lumps or nodules in either breasts All other systems were reviewed with the patient and are negative.  I have reviewed the past medical history, past surgical history, social history and family history with the patient and they are unchanged from previous note.  ALLERGIES:  is allergic to hydrocodone.  MEDICATIONS:  Current Outpatient Medications  Medication Sig Dispense Refill  . diphenoxylate-atropine (LOMOTIL) 2.5-0.025 MG tablet 1 to 2 tablets 4 times daily prn diarrhea 40 tablet 2  . ibuprofen (ADVIL,MOTRIN) 600 MG tablet Take 1 tablet (600 mg total) by mouth every 6 (six) hours as needed (  for mild pain not relieved by other medications.). 30 tablet 0  . lisinopril (ZESTRIL) 40 MG tablet Take 1 tablet (40 mg total) by mouth daily. 90 tablet 3  . LORazepam (ATIVAN) 0.5 MG tablet  Take 1 tablet 30 minutes prior to scheduled procedure.  May repeat 1 time if needed. 5 tablet 0  . megestrol (MEGACE) 40 MG tablet Take 3 tablets every day (Patient taking differently: Take 60 mg by mouth 2 (two) times daily with a meal. Take 3 tablets every day) 270 tablet 11  . ondansetron (ZOFRAN-ODT) 4 MG disintegrating tablet Take 1 tablet (4 mg total) by mouth every 6 (six) hours as needed for nausea. 15 tablet 0  . pantoprazole (PROTONIX) 40 MG tablet TAKE 1 TABLET BY MOUTH 30 MINUTES PRIOR TO BREAKFAST AND SUPPER 180 tablet 3  . polyethylene glycol (MIRALAX / GLYCOLAX) packet Take 17 g by mouth daily as needed for moderate constipation.    . potassium chloride (K-DUR) 10 MEQ tablet TAKE 1 TABLET BY MOUTH EVERY DAY 30 tablet 1  . pravastatin (PRAVACHOL) 40 MG tablet Take 1 tablet (40 mg total) by mouth daily. (Needs to be seen before next refill) 90 tablet 3  . tamoxifen (NOLVADEX) 20 MG tablet Take 1 tablet (20 mg total) by mouth daily. 90 tablet 3   No current facility-administered medications for this visit.    Facility-Administered Medications Ordered in Other Visits  Medication Dose Route Frequency Provider Last Rate Last Dose  . sodium chloride flush (NS) 0.9 % injection 10 mL  10 mL Intracatheter PRN Nicholas Lose, MD   10 mL at 10/07/18 1420    PHYSICAL EXAMINATION: ECOG PERFORMANCE STATUS: 1 - Symptomatic but completely ambulatory  There were no vitals filed for this visit. There were no vitals filed for this visit.  GENERAL: alert, no distress and comfortable SKIN: skin color, texture, turgor are normal, no rashes or significant lesions EYES: normal, Conjunctiva are pink and non-injected, sclera clear OROPHARYNX: no exudate, no erythema and lips, buccal mucosa, and tongue normal  NECK: supple, thyroid normal size, non-tender, without nodularity LYMPH: no palpable lymphadenopathy in the cervical, axillary or inguinal LUNGS: clear to auscultation and percussion with normal  breathing effort HEART: regular rate & rhythm and no murmurs and no lower extremity edema ABDOMEN: abdomen soft, non-tender and normal bowel sounds MUSCULOSKELETAL: no cyanosis of digits and no clubbing  NEURO: alert & oriented x 3 with fluent speech, no focal motor/sensory deficits EXTREMITIES: No lower extremity edema  LABORATORY DATA:  I have reviewed the data as listed CMP Latest Ref Rng & Units 09/16/2018 08/26/2018 08/19/2018  Glucose 70 - 99 mg/dL 112(H) 103(H) 129(H)  BUN 6 - 20 mg/dL 15 13 21(H)  Creatinine 0.44 - 1.00 mg/dL 0.64 0.62 0.71  Sodium 135 - 145 mmol/L 144 138 142  Potassium 3.5 - 5.1 mmol/L 3.8 3.5 3.7  Chloride 98 - 111 mmol/L 111 103 107  CO2 22 - 32 mmol/L _0 Calcium 8.9 - 10.3 mg/dL 8.9 9.0 9.4  Total Protein 6.5 - 8.1 g/dL 7.4 7.2 7.6  Total Bilirubin 0.3 - 1.2 mg/dL <0.2(L) 0.6 0.2(L)  Alkaline Phos 38 - 126 U/L 76 76 90  AST 15 - 41 U/L 14(L) 12(L) 13(L)  ALT 0 - 44 U/L 20 27 47(H)    Lab Results  Component Value Date   WBC 9.0 09/16/2018   HGB 11.4 (L) 09/16/2018   HCT 34.8 (L) 09/16/2018   MCV 94.1 09/16/2018  PLT 417 (H) 09/16/2018   NEUTROABS 5.8 09/16/2018    ASSESSMENT & PLAN:  Malignant neoplasm of upper-outer quadrant of right breast in female, estrogen receptor positive ([place]) 05/01/18:Bilateral mastectomies: Left mastectomy: DCIS intermediate grade 0.9 cm margins negative Tis NX stage 0; right mastectomy: ILC, grade 3, 3.5 cm, with LCIS, perineural invasion present, margins negative, 15/22 lymph nodes positive with extracapsular extension, ER 90%, PR 0%, HER-2 +(IHC 3+), Ki-67 5%, T2N3M1 Stage IV  06/03/2018 bone scan: Increased tracer uptake in the lumbar spine at L1, L3 and L4, right fourth and seventh ribs, inferior right scapula and distal sternum, T7 and T11 as well.  CT CAPshowed widespread bone metastases with possible pathologic fracture at L3, peripheral right upper lobe nodules nonspecific Patient has cerebral palsy and  hersisteris the power of attorney for healthcare Treatment plan: Palliative treatment with Taxol x12 cycles completed 08/26/2018,Kingenti, Perjeta ------------------------------------------------------------------------------------------------------------------------ PET CT scan: Will need to be scheduled Current treatment: Kingenti maintenance along with anastrozole Patient will come every 3 weeks for Kingenti and every 9 weeks for follow-up with me with labs. Anastrozole toxicities: Denies any hot flashes or myalgias. Kingenti toxicities: None  Return to clinic every 3 weeks for Kingenti and every 6 weeks for follow-up with me  We will obtain a PET CT scan prior to the next visit to evaluate the response to treatment.  We will add an MD appointment to the next visit.  No orders of the defined types were placed in this encounter.  The patient has a good understanding of the overall plan. she agrees with it. she will call with any problems that may develop before the next visit here.  Nicholas Lose, MD 10/07/2018  Julious Oka Dorshimer am acting as scribe for Dr. Nicholas Lose.  I have reviewed the above documentation for accuracy and completeness, and I agree with the above.

## 2018-10-07 ENCOUNTER — Encounter: Payer: Self-pay | Admitting: *Deleted

## 2018-10-07 ENCOUNTER — Inpatient Hospital Stay (HOSPITAL_BASED_OUTPATIENT_CLINIC_OR_DEPARTMENT_OTHER): Payer: Medicare Other | Admitting: Hematology and Oncology

## 2018-10-07 ENCOUNTER — Inpatient Hospital Stay: Payer: Medicare Other

## 2018-10-07 ENCOUNTER — Other Ambulatory Visit: Payer: Self-pay

## 2018-10-07 VITALS — BP 130/83 | HR 88 | Temp 99.1°F | Resp 18 | Ht 62.0 in | Wt 120.5 lb

## 2018-10-07 VITALS — BP 155/75 | HR 67 | Temp 99.5°F | Resp 17

## 2018-10-07 DIAGNOSIS — C7951 Secondary malignant neoplasm of bone: Secondary | ICD-10-CM

## 2018-10-07 DIAGNOSIS — Z170 Estrogen receptor positive status [ER+]: Secondary | ICD-10-CM

## 2018-10-07 DIAGNOSIS — Z79899 Other long term (current) drug therapy: Secondary | ICD-10-CM

## 2018-10-07 DIAGNOSIS — Z7189 Other specified counseling: Secondary | ICD-10-CM

## 2018-10-07 DIAGNOSIS — Z95828 Presence of other vascular implants and grafts: Secondary | ICD-10-CM

## 2018-10-07 DIAGNOSIS — Z9013 Acquired absence of bilateral breasts and nipples: Secondary | ICD-10-CM

## 2018-10-07 DIAGNOSIS — C50411 Malignant neoplasm of upper-outer quadrant of right female breast: Secondary | ICD-10-CM | POA: Diagnosis not present

## 2018-10-07 DIAGNOSIS — Z5112 Encounter for antineoplastic immunotherapy: Secondary | ICD-10-CM | POA: Diagnosis not present

## 2018-10-07 MED ORDER — ACETAMINOPHEN 325 MG PO TABS
ORAL_TABLET | ORAL | Status: AC
  Filled 2018-10-07: qty 2

## 2018-10-07 MED ORDER — SODIUM CHLORIDE 0.9 % IV SOLN
Freq: Once | INTRAVENOUS | Status: AC
  Administered 2018-10-07: 15:00:00 via INTRAVENOUS
  Filled 2018-10-07: qty 250

## 2018-10-07 MED ORDER — TRASTUZUMAB CHEMO 150 MG IV SOLR
6.0000 mg/kg | Freq: Once | INTRAVENOUS | Status: AC
  Administered 2018-10-07: 16:00:00 336 mg via INTRAVENOUS
  Filled 2018-10-07: qty 16

## 2018-10-07 MED ORDER — DIPHENHYDRAMINE HCL 25 MG PO CAPS
ORAL_CAPSULE | ORAL | Status: AC
  Filled 2018-10-07: qty 2

## 2018-10-07 MED ORDER — SODIUM CHLORIDE 0.9 % IV SOLN
420.0000 mg | Freq: Once | INTRAVENOUS | Status: AC
  Administered 2018-10-07: 17:00:00 420 mg via INTRAVENOUS
  Filled 2018-10-07: qty 14

## 2018-10-07 MED ORDER — DIPHENHYDRAMINE HCL 25 MG PO CAPS
50.0000 mg | ORAL_CAPSULE | Freq: Once | ORAL | Status: AC
  Administered 2018-10-07: 50 mg via ORAL

## 2018-10-07 MED ORDER — ACETAMINOPHEN 325 MG PO TABS
650.0000 mg | ORAL_TABLET | Freq: Once | ORAL | Status: AC
  Administered 2018-10-07: 16:00:00 650 mg via ORAL

## 2018-10-07 MED ORDER — HEPARIN SOD (PORK) LOCK FLUSH 100 UNIT/ML IV SOLN
500.0000 [IU] | Freq: Once | INTRAVENOUS | Status: AC | PRN
  Administered 2018-10-07: 500 [IU]
  Filled 2018-10-07: qty 5

## 2018-10-07 MED ORDER — SODIUM CHLORIDE 0.9% FLUSH
10.0000 mL | INTRAVENOUS | Status: DC | PRN
  Administered 2018-10-07: 18:00:00 10 mL
  Filled 2018-10-07: qty 10

## 2018-10-07 MED ORDER — MEGESTROL ACETATE 40 MG PO TABS: 40.0000 mg | ORAL_TABLET | Freq: Two times a day (BID) | ORAL | 3 refills | Status: DC

## 2018-10-07 MED ORDER — SODIUM CHLORIDE 0.9% FLUSH
10.0000 mL | INTRAVENOUS | Status: DC | PRN
  Administered 2018-10-07: 10 mL
  Filled 2018-10-07: qty 10

## 2018-10-07 NOTE — Patient Instructions (Signed)
[facility] Discharge Instructions for Patients Receiving Chemotherapy  Today you received the following chemotherapy agents: Herceptin, Perjeta  To help prevent nausea and vomiting after your treatment, we encourage you to take your nausea medication as directed.   If you develop nausea and vomiting that is not controlled by your nausea medication, call the clinic.   BELOW ARE SYMPTOMS THAT SHOULD BE REPORTED IMMEDIATELY:  *FEVER GREATER THAN 100.5 F  *CHILLS WITH OR WITHOUT FEVER  NAUSEA AND VOMITING THAT IS NOT CONTROLLED WITH YOUR NAUSEA MEDICATION  *UNUSUAL SHORTNESS OF BREATH  *UNUSUAL BRUISING OR BLEEDING  TENDERNESS IN MOUTH AND THROAT WITH OR WITHOUT PRESENCE OF ULCERS  *URINARY PROBLEMS  *BOWEL PROBLEMS  UNUSUAL RASH Items with * indicate a potential emergency and should be followed up as soon as possible.  Feel free to call the clinic should you have any questions or concerns. The clinic phone number is [phone].  Please show the CHEMO ALERT CARD at check-in to the Emergency Department and triage nurse.   

## 2018-10-08 ENCOUNTER — Telehealth: Payer: Self-pay | Admitting: Hematology and Oncology

## 2018-10-08 NOTE — Telephone Encounter (Signed)
Called regarding schedule °

## 2018-10-21 NOTE — Assessment & Plan Note (Addendum)
05/01/18:Bilateral mastectomies: Left mastectomy: DCIS intermediate grade 0.9 cm margins negative Tis NX stage 0; right mastectomy: ILC, grade 3, 3.5 cm, with LCIS, perineural invasion present, margins negative, 15/22 lymph nodes positive with extracapsular extension, ER 90%, PR 0%, HER-2 +(IHC 3+), Ki-67 5%, T2N3M1Stage IV  06/03/2018 bone scan: Increased tracer uptake in the lumbar spine at L1, L3 and L4, right fourth and seventh ribs, inferior right scapula and distal sternum, T7 and T11 as well.  CT CAPshowed widespread bone metastases with possible pathologic fracture at L3, peripheral right upper lobe nodules nonspecific Patient has cerebral palsy and hersisteris the power of attorney for healthcare Treatment plan: Palliative treatment with Taxol x12 cycles completed 08/26/2018,Kingenti, Perjeta ------------------------------------------------------------------------------------------------------------------------ PET CT scan: 10/27/2018  Radiology review: Widespread bone metastatic disease demonstrates reduced with sclerosis and low metabolic activity with SUV of 3-4.  Left level 4 lymph node 0.5 cm and left deep parotid lymph node SUV 4.1 will need follow-up.  Uterine leiomyomas.  Current treatment: Kingenti maintenance along with anastrozole Patient will come every 3 weeks for Kingenti and every 9 weeks for follow-up with me with labs.  Anastrozole toxicities: Denies any hot flashes or myalgias. Kingenti toxicities: None  Return to clinic every 3 weeks for Kingenti and every 9 weeks for follow-up with me

## 2018-10-26 ENCOUNTER — Other Ambulatory Visit: Payer: Self-pay | Admitting: *Deleted

## 2018-10-27 ENCOUNTER — Ambulatory Visit (HOSPITAL_COMMUNITY)
Admission: RE | Admit: 2018-10-27 | Discharge: 2018-10-27 | Disposition: A | Discharge status: Home / Self Care | Payer: Medicare Other | Source: Ambulatory Visit | Attending: Hematology and Oncology | Admitting: Hematology and Oncology

## 2018-10-27 ENCOUNTER — Other Ambulatory Visit: Payer: Self-pay

## 2018-10-27 DIAGNOSIS — M19012 Primary osteoarthritis, left shoulder: Secondary | ICD-10-CM | POA: Insufficient documentation

## 2018-10-27 DIAGNOSIS — I517 Cardiomegaly: Secondary | ICD-10-CM | POA: Diagnosis not present

## 2018-10-27 DIAGNOSIS — C50411 Malignant neoplasm of upper-outer quadrant of right female breast: Secondary | ICD-10-CM

## 2018-10-27 DIAGNOSIS — N859 Noninflammatory disorder of uterus, unspecified: Secondary | ICD-10-CM | POA: Diagnosis not present

## 2018-10-27 DIAGNOSIS — M419 Scoliosis, unspecified: Secondary | ICD-10-CM | POA: Diagnosis not present

## 2018-10-27 DIAGNOSIS — C7951 Secondary malignant neoplasm of bone: Secondary | ICD-10-CM | POA: Diagnosis not present

## 2018-10-27 DIAGNOSIS — Z79899 Other long term (current) drug therapy: Secondary | ICD-10-CM | POA: Insufficient documentation

## 2018-10-27 DIAGNOSIS — Z170 Estrogen receptor positive status [ER+]: Secondary | ICD-10-CM | POA: Insufficient documentation

## 2018-10-27 DIAGNOSIS — C50911 Malignant neoplasm of unspecified site of right female breast: Secondary | ICD-10-CM | POA: Diagnosis not present

## 2018-10-27 LAB — GLUCOSE, CAPILLARY: Glucose-Capillary: 70 mg/dL (ref 70–99)

## 2018-10-27 MED ORDER — FLUDEOXYGLUCOSE F - 18 (FDG) INJECTION
5.9600 | Freq: Once | INTRAVENOUS | Status: AC
  Administered 2018-10-27: 5.96 via INTRAVENOUS

## 2018-10-27 NOTE — Progress Notes (Signed)
Patient Care Team: Janora Norlander, DO as PCP - General (Family Medicine) Danie Binder, MD (Gastroenterology) Alphonsa Overall, MD as Consulting Physician (General Surgery) Nicholas Lose, MD as Consulting Physician (Hematology and Oncology) Kyung Rudd, MD as Consulting Physician (Radiation Oncology) Mauro Kaufmann, RN as Oncology Nurse Navigator Rockwell Germany, RN as Oncology Nurse Navigator  DIAGNOSIS:    ICD-10-CM   1. Malignant neoplasm of upper-outer quadrant of right breast in female, estrogen receptor positive ([place])  C50.411    Z17.0     SUMMARY OF ONCOLOGIC HISTORY: Oncology History  Malignant neoplasm of upper-outer quadrant of right breast in female, estrogen receptor positive ([place])  03/18/2018 Initial Diagnosis   Screening detected bilateral breast abnormalities.  Right breast mass UOQ 2.1 cm at 10 o'clock position: Grade 1 ILC ER 70%, PR 0%, HER-2 +3+ by IHC, Ki-67 5%, suspected lymph node in the axilla could not be biopsied; left breast calcifications by ultrasound not visible but 5 cm cystic/tubular structure which on biopsy was fibrocystic change with ADHl; T2N0 stage IIa clinical stage   05/01/2018 Surgery   Bilateral mastectomies: Left mastectomy: DCIS intermediate grade 0.9 cm margins negative Tis NX stage 0; right mastectomy: ILC, grade 3, 3.5 cm, with LCIS, perineural invasion present, margins negative, 15/22 lymph nodes positive with extracapsular extension, ER 90%, PR 0%, HER-2 +(IHC 3+), Ki-67 5%, T2N3 Stage 3B    05/11/2018 Cancer Staging   Staging form: Breast, AJCC 8th Edition - Pathologic stage from 05/11/2018: Stage IIIB (pT2, pN3, cM0, G3, ER+, PR-, HER2+) - Signed by Nicholas Lose, MD on 05/11/2018   06/03/2018 Imaging   Bone scan: Increased tracer uptake in the lumbar spine at L1, L3 and L4, right fourth and seventh ribs, inferior right scapula and distal sternum, T7 and T11 as well.   CT CAP showed widespread bone metastases with possible  pathologic fracture at L3, peripheral right upper lobe nodules nonspecific   06/10/2018 -  Chemotherapy   Taxol Kingenti and Perjeta    10/27/2018 PET scan   Bone metastases less metabolically active.  Couple of lymph nodes with low-level metabolic activity noted.     CHIEF COMPLIANT: Follow-up of Kingenti maintenance and anastrozole  INTERVAL HISTORY: [name] is a 53 y.o. with above-mentioned history of metastatic breast cancer currently on treatment with maintenance Kingenti and on anti-estrogen therapy with tamoxifen. PET scan on 10/27/18 showed reduced activity in the osseous metastases and low-grade metabolic activity in a left level IV lymph node and left deep parotid lymph node. There were multiple masses in the uterus, possible for hypermetabolic leiomyomas or more aggressive tumors. She presents to the clinic today for treatment and to discuss her PET scan results.   REVIEW OF SYSTEMS:   Constitutional: Denies fevers, chills or abnormal weight loss Eyes: Denies blurriness of vision Ears, nose, mouth, throat, and face: Denies mucositis or sore throat Respiratory: Denies cough, dyspnea or wheezes Cardiovascular: Denies palpitation, chest discomfort Gastrointestinal: Denies nausea, heartburn or change in bowel habits Skin: Denies abnormal skin rashes Lymphatics: Denies new lymphadenopathy or easy bruising Neurological: Denies numbness, tingling or new weaknesses Behavioral/Psych: Mood is stable, no new changes  Extremities: No lower extremity edema Breast: denies any pain or lumps or nodules in either breasts All other systems were reviewed with the patient and are negative.  I have reviewed the past medical history, past surgical history, social history and family history with the patient and they are unchanged from previous note.  ALLERGIES:  is  allergic to hydrocodone.  MEDICATIONS:  Current Outpatient Medications  Medication Sig Dispense Refill  .  diphenoxylate-atropine (LOMOTIL) 2.5-0.025 MG tablet 1 to 2 tablets 4 times daily prn diarrhea 40 tablet 2  . ibuprofen (ADVIL,MOTRIN) 600 MG tablet Take 1 tablet (600 mg total) by mouth every 6 (six) hours as needed (for mild pain not relieved by other medications.). 30 tablet 0  . lisinopril (ZESTRIL) 40 MG tablet Take 1 tablet (40 mg total) by mouth daily. 90 tablet 3  . megestrol (MEGACE) 40 MG tablet TAKE 3 TABLETS EVERY DAY 270 tablet 11  . megestrol (MEGACE) 40 MG tablet Take 1 tablet (40 mg total) by mouth 2 (two) times daily. Take 3 tablets every day 180 tablet 3  . ondansetron (ZOFRAN-ODT) 4 MG disintegrating tablet Take 1 tablet (4 mg total) by mouth every 6 (six) hours as needed for nausea. 15 tablet 0  . pantoprazole (PROTONIX) 40 MG tablet TAKE 1 TABLET BY MOUTH 30 MINUTES PRIOR TO BREAKFAST AND SUPPER 180 tablet 3  . polyethylene glycol (MIRALAX / GLYCOLAX) packet Take 17 g by mouth daily as needed for moderate constipation.    . potassium chloride (K-DUR) 10 MEQ tablet TAKE 1 TABLET BY MOUTH EVERY DAY 30 tablet 1  . pravastatin (PRAVACHOL) 40 MG tablet Take 1 tablet (40 mg total) by mouth daily. (Needs to be seen before next refill) 90 tablet 3  . tamoxifen (NOLVADEX) 20 MG tablet Take 1 tablet (20 mg total) by mouth daily. 90 tablet 3   No current facility-administered medications for this visit.     PHYSICAL EXAMINATION: ECOG PERFORMANCE STATUS: 1 - Symptomatic but completely ambulatory  Vitals:   10/28/18 1321  BP: (!) 142/87  Pulse: (!) 51  Resp: 17  Temp: 98.7 F (37.1 C)  SpO2: 97%   Filed Weights   10/28/18 1321  Weight: 125 lb 1.6 oz (56.7 kg)   Physical exam not done due to COVID-19 precautions  LABORATORY DATA:  I have reviewed the data as listed CMP Latest Ref Rng & Units 09/16/2018 08/26/2018 08/19/2018  Glucose 70 - 99 mg/dL 112(H) 103(H) 129(H)  BUN 6 - 20 mg/dL 15 13 21(H)  Creatinine 0.44 - 1.00 mg/dL 0.64 0.62 0.71  Sodium 135 - 145 mmol/L 144 138  142  Potassium 3.5 - 5.1 mmol/L 3.8 3.5 3.7  Chloride 98 - 111 mmol/L 111 103 107  CO2 22 - 32 mmol/L 23 22 25   Calcium 8.9 - 10.3 mg/dL 8.9 9.0 9.4  Total Protein 6.5 - 8.1 g/dL 7.4 7.2 7.6  Total Bilirubin 0.3 - 1.2 mg/dL <0.2(L) 0.6 0.2(L)  Alkaline Phos 38 - 126 U/L 76 76 90  AST 15 - 41 U/L 14(L) 12(L) 13(L)  ALT 0 - 44 U/L 20 27 47(H)    Lab Results  Component Value Date   WBC 7.7 10/28/2018   HGB 11.0 (L) 10/28/2018   HCT 34.2 (L) 10/28/2018   MCV 93.7 10/28/2018   PLT 328 10/28/2018   NEUTROABS 4.7 10/28/2018    ASSESSMENT & PLAN:  Malignant neoplasm of upper-outer quadrant of right breast in female, estrogen receptor positive ([place]) 05/01/18:Bilateral mastectomies: Left mastectomy: DCIS intermediate grade 0.9 cm margins negative Tis NX stage 0; right mastectomy: ILC, grade 3, 3.5 cm, with LCIS, perineural invasion present, margins negative, 15/22 lymph nodes positive with extracapsular extension, ER 90%, PR 0%, HER-2 +(IHC 3+), Ki-67 5%, T2N3M1Stage IV  06/03/2018 bone scan: Increased tracer uptake in the lumbar spine at L1, L3  and L4, right fourth and seventh ribs, inferior right scapula and distal sternum, T7 and T11 as well.  CT CAPshowed widespread bone metastases with possible pathologic fracture at L3, peripheral right upper lobe nodules nonspecific Patient has cerebral palsy and hersisteris the power of attorney for healthcare Treatment plan: Palliative treatment with Taxol x12 cycles completed 08/26/2018,Kingenti, Perjeta ------------------------------------------------------------------------------------------------------------------------ PET CT scan: 10/27/2018  Radiology review: Widespread bone metastatic disease demonstrates reduced with sclerosis and low metabolic activity with SUV of 3-4.  Left level 4 lymph node 0.5 cm and left deep parotid lymph node SUV 4.1 will need follow-up.  Uterine leiomyomas.  Current treatment: Kingenti maintenance along with  anastrozole  Anastrozole toxicities: Denies any hot flashes or myalgias. Kingenti toxicities: None  Return to clinic every 3 weeks for Kingenti and every 6 weeks for follow-up with me  After 3 months we will repeat another scan. Subsequently we can change Kingenti to every 4 weeks.    No orders of the defined types were placed in this encounter.  The patient has a good understanding of the overall plan. she agrees with it. she will call with any problems that may develop before the next visit here.  Nicholas Lose, MD 10/28/2018  Julious Oka Dorshimer am acting as scribe for Dr. Nicholas Lose.  I have reviewed the above documentation for accuracy and completeness, and I agree with the above.

## 2018-10-28 ENCOUNTER — Inpatient Hospital Stay: Payer: Medicare Other | Attending: Hematology and Oncology

## 2018-10-28 ENCOUNTER — Other Ambulatory Visit: Payer: Medicare Other

## 2018-10-28 ENCOUNTER — Inpatient Hospital Stay (HOSPITAL_BASED_OUTPATIENT_CLINIC_OR_DEPARTMENT_OTHER): Payer: Medicare Other | Admitting: Hematology and Oncology

## 2018-10-28 ENCOUNTER — Other Ambulatory Visit: Payer: Self-pay

## 2018-10-28 ENCOUNTER — Other Ambulatory Visit: Payer: Self-pay | Admitting: Hematology and Oncology

## 2018-10-28 ENCOUNTER — Inpatient Hospital Stay: Payer: Medicare Other

## 2018-10-28 ENCOUNTER — Encounter: Payer: Self-pay | Admitting: *Deleted

## 2018-10-28 VITALS — BP 176/91 | HR 75 | Temp 99.5°F | Resp 20

## 2018-10-28 DIAGNOSIS — C7951 Secondary malignant neoplasm of bone: Secondary | ICD-10-CM | POA: Diagnosis not present

## 2018-10-28 DIAGNOSIS — Z5112 Encounter for antineoplastic immunotherapy: Secondary | ICD-10-CM | POA: Diagnosis not present

## 2018-10-28 DIAGNOSIS — Z9013 Acquired absence of bilateral breasts and nipples: Secondary | ICD-10-CM | POA: Diagnosis not present

## 2018-10-28 DIAGNOSIS — Z79899 Other long term (current) drug therapy: Secondary | ICD-10-CM | POA: Diagnosis not present

## 2018-10-28 DIAGNOSIS — C50411 Malignant neoplasm of upper-outer quadrant of right female breast: Secondary | ICD-10-CM | POA: Insufficient documentation

## 2018-10-28 DIAGNOSIS — G809 Cerebral palsy, unspecified: Secondary | ICD-10-CM | POA: Diagnosis not present

## 2018-10-28 DIAGNOSIS — Z170 Estrogen receptor positive status [ER+]: Secondary | ICD-10-CM | POA: Insufficient documentation

## 2018-10-28 DIAGNOSIS — Z9221 Personal history of antineoplastic chemotherapy: Secondary | ICD-10-CM

## 2018-10-28 DIAGNOSIS — R1900 Intra-abdominal and pelvic swelling, mass and lump, unspecified site: Secondary | ICD-10-CM

## 2018-10-28 DIAGNOSIS — Z7189 Other specified counseling: Secondary | ICD-10-CM

## 2018-10-28 DIAGNOSIS — Z95828 Presence of other vascular implants and grafts: Secondary | ICD-10-CM

## 2018-10-28 LAB — CMP (CANCER CENTER ONLY)
ALT: 12 U/L (ref 0–44)
AST: 12 U/L — ABNORMAL LOW (ref 15–41)
Albumin: 3.7 g/dL (ref 3.5–5.0)
Alkaline Phosphatase: 61 U/L (ref 38–126)
Anion gap: 6 (ref 5–15)
BUN: 14 mg/dL (ref 6–20)
CO2: 25 mmol/L (ref 22–32)
Calcium: 9 mg/dL (ref 8.9–10.3)
Chloride: 112 mmol/L — ABNORMAL HIGH (ref 98–111)
Creatinine: 0.69 mg/dL (ref 0.44–1.00)
GFR, Est AFR Am: >60 mL/min (ref >60)
GFR, Estimated: >60 mL/min (ref >60)
Glucose, Bld: 115 mg/dL — ABNORMAL HIGH (ref 70–99)
Potassium: 3.5 mmol/L (ref 3.5–5.1)
Sodium: 143 mmol/L (ref 135–145)
Total Bilirubin: 0.3 mg/dL (ref 0.3–1.2)
Total Protein: 7.3 g/dL (ref 6.5–8.1)

## 2018-10-28 LAB — CBC WITH DIFFERENTIAL (CANCER CENTER ONLY)
Abs Immature Granulocytes: 0.02 10*3/uL (ref 0.00–0.07)
Basophils Absolute: 0 10*3/uL (ref 0.0–0.1)
Basophils Relative: 1 %
Eosinophils Absolute: 0.1 10*3/uL (ref 0.0–0.5)
Eosinophils Relative: 1 %
HCT: 34.2 % — ABNORMAL LOW (ref 36.0–46.0)
Hemoglobin: 11 g/dL — ABNORMAL LOW (ref 12.0–15.0)
Immature Granulocytes: 0 %
Lymphocytes Relative: 31 %
Lymphs Abs: 2.4 10*3/uL (ref 0.7–4.0)
MCH: 30.1 pg (ref 26.0–34.0)
MCHC: 32.2 g/dL (ref 30.0–36.0)
MCV: 93.7 fL (ref 80.0–100.0)
Monocytes Absolute: 0.5 10*3/uL (ref 0.1–1.0)
Monocytes Relative: 7 %
Neutro Abs: 4.7 10*3/uL (ref 1.7–7.7)
Neutrophils Relative %: 60 %
Platelet Count: 328 10*3/uL (ref 150–400)
RBC: 3.65 MIL/uL — ABNORMAL LOW (ref 3.87–5.11)
RDW: 13.3 % (ref 11.5–15.5)
WBC Count: 7.7 10*3/uL (ref 4.0–10.5)
nRBC: 0 % (ref 0.0–0.2)

## 2018-10-28 MED ORDER — DIPHENHYDRAMINE HCL 25 MG PO CAPS
ORAL_CAPSULE | ORAL | Status: AC
  Filled 2018-10-28: qty 1

## 2018-10-28 MED ORDER — SODIUM CHLORIDE 0.9 % IV SOLN
420.0000 mg | Freq: Once | INTRAVENOUS | Status: AC
  Administered 2018-10-28: 420 mg via INTRAVENOUS
  Filled 2018-10-28: qty 14

## 2018-10-28 MED ORDER — SODIUM CHLORIDE 0.9% FLUSH
10.0000 mL | INTRAVENOUS | Status: DC | PRN
  Administered 2018-10-28: 10 mL
  Filled 2018-10-28: qty 10

## 2018-10-28 MED ORDER — TRASTUZUMAB CHEMO 150 MG IV SOLR
6.0000 mg/kg | Freq: Once | INTRAVENOUS | Status: AC
  Administered 2018-10-28: 336 mg via INTRAVENOUS
  Filled 2018-10-28: qty 16

## 2018-10-28 MED ORDER — ACETAMINOPHEN 325 MG PO TABS
ORAL_TABLET | ORAL | Status: AC
  Filled 2018-10-28: qty 2

## 2018-10-28 MED ORDER — SODIUM CHLORIDE 0.9 % IV SOLN
Freq: Once | INTRAVENOUS | Status: AC
  Administered 2018-10-28: 14:00:00 via INTRAVENOUS
  Filled 2018-10-28: qty 250

## 2018-10-28 MED ORDER — HEPARIN SOD (PORK) LOCK FLUSH 100 UNIT/ML IV SOLN
500.0000 [IU] | Freq: Once | INTRAVENOUS | Status: AC | PRN
  Administered 2018-10-28: 500 [IU]
  Filled 2018-10-28: qty 5

## 2018-10-28 MED ORDER — DIPHENHYDRAMINE HCL 25 MG PO CAPS
50.0000 mg | ORAL_CAPSULE | Freq: Once | ORAL | Status: AC
  Administered 2018-10-28: 50 mg via ORAL

## 2018-10-28 MED ORDER — ACETAMINOPHEN 325 MG PO TABS
650.0000 mg | ORAL_TABLET | Freq: Once | ORAL | Status: AC
  Administered 2018-10-28: 650 mg via ORAL

## 2018-10-28 NOTE — Progress Notes (Signed)
Okay to treat today with echocardiogram from 07/2018 per Dr. Lindi Adie.  Orders placed for future echocardiogram.

## 2018-10-28 NOTE — Patient Instructions (Signed)
[facility] Discharge Instructions for Patients Receiving Chemotherapy  Today you received the following chemotherapy agents: Herceptin, Perjeta  To help prevent nausea and vomiting after your treatment, we encourage you to take your nausea medication as directed.   If you develop nausea and vomiting that is not controlled by your nausea medication, call the clinic.   BELOW ARE SYMPTOMS THAT SHOULD BE REPORTED IMMEDIATELY:  *FEVER GREATER THAN 100.5 F  *CHILLS WITH OR WITHOUT FEVER  NAUSEA AND VOMITING THAT IS NOT CONTROLLED WITH YOUR NAUSEA MEDICATION  *UNUSUAL SHORTNESS OF BREATH  *UNUSUAL BRUISING OR BLEEDING  TENDERNESS IN MOUTH AND THROAT WITH OR WITHOUT PRESENCE OF ULCERS  *URINARY PROBLEMS  *BOWEL PROBLEMS  UNUSUAL RASH Items with * indicate a potential emergency and should be followed up as soon as possible.  Feel free to call the clinic should you have any questions or concerns. The clinic phone number is [phone].  Please show the CHEMO ALERT CARD at check-in to the Emergency Department and triage nurse.   

## 2018-11-04 ENCOUNTER — Other Ambulatory Visit: Payer: Self-pay

## 2018-11-04 ENCOUNTER — Ambulatory Visit (HOSPITAL_COMMUNITY)
Admission: RE | Admit: 2018-11-04 | Discharge: 2018-11-04 | Disposition: A | Discharge status: Home / Self Care | Payer: Medicare Other | Source: Ambulatory Visit | Attending: Hematology and Oncology | Admitting: Hematology and Oncology

## 2018-11-04 DIAGNOSIS — K219 Gastro-esophageal reflux disease without esophagitis: Secondary | ICD-10-CM | POA: Insufficient documentation

## 2018-11-04 DIAGNOSIS — Z170 Estrogen receptor positive status [ER+]: Secondary | ICD-10-CM | POA: Diagnosis not present

## 2018-11-04 DIAGNOSIS — E785 Hyperlipidemia, unspecified: Secondary | ICD-10-CM | POA: Insufficient documentation

## 2018-11-04 DIAGNOSIS — I10 Essential (primary) hypertension: Secondary | ICD-10-CM | POA: Insufficient documentation

## 2018-11-04 DIAGNOSIS — I358 Other nonrheumatic aortic valve disorders: Secondary | ICD-10-CM | POA: Diagnosis not present

## 2018-11-04 DIAGNOSIS — G809 Cerebral palsy, unspecified: Secondary | ICD-10-CM | POA: Diagnosis not present

## 2018-11-04 DIAGNOSIS — C50411 Malignant neoplasm of upper-outer quadrant of right female breast: Secondary | ICD-10-CM | POA: Insufficient documentation

## 2018-11-04 NOTE — Progress Notes (Signed)
  Echocardiogram 2D Echocardiogram has been performed.  [name] [name] 11/04/2018, 11:00 AM

## 2018-11-18 ENCOUNTER — Ambulatory Visit: Payer: Medicare Other | Admitting: Hematology and Oncology

## 2018-11-18 ENCOUNTER — Inpatient Hospital Stay: Payer: Medicare Other | Attending: Hematology and Oncology

## 2018-11-18 ENCOUNTER — Other Ambulatory Visit: Payer: Self-pay

## 2018-11-18 ENCOUNTER — Inpatient Hospital Stay: Payer: Medicare Other

## 2018-11-18 VITALS — BP 150/94 | HR 78 | Temp 98.9°F | Resp 18

## 2018-11-18 DIAGNOSIS — Z5112 Encounter for antineoplastic immunotherapy: Secondary | ICD-10-CM | POA: Insufficient documentation

## 2018-11-18 DIAGNOSIS — C7951 Secondary malignant neoplasm of bone: Secondary | ICD-10-CM | POA: Insufficient documentation

## 2018-11-18 DIAGNOSIS — Z79899 Other long term (current) drug therapy: Secondary | ICD-10-CM | POA: Insufficient documentation

## 2018-11-18 DIAGNOSIS — Z7189 Other specified counseling: Secondary | ICD-10-CM

## 2018-11-18 DIAGNOSIS — Z170 Estrogen receptor positive status [ER+]: Secondary | ICD-10-CM | POA: Insufficient documentation

## 2018-11-18 DIAGNOSIS — Z95828 Presence of other vascular implants and grafts: Secondary | ICD-10-CM

## 2018-11-18 DIAGNOSIS — G809 Cerebral palsy, unspecified: Secondary | ICD-10-CM | POA: Diagnosis not present

## 2018-11-18 DIAGNOSIS — Z79811 Long term (current) use of aromatase inhibitors: Secondary | ICD-10-CM | POA: Insufficient documentation

## 2018-11-18 DIAGNOSIS — C50411 Malignant neoplasm of upper-outer quadrant of right female breast: Secondary | ICD-10-CM | POA: Insufficient documentation

## 2018-11-18 MED ORDER — ACETAMINOPHEN 325 MG PO TABS
ORAL_TABLET | ORAL | Status: AC
  Filled 2018-11-18: qty 2

## 2018-11-18 MED ORDER — TRASTUZUMAB CHEMO 150 MG IV SOLR
6.0000 mg/kg | Freq: Once | INTRAVENOUS | Status: AC
  Administered 2018-11-18: 336 mg via INTRAVENOUS
  Filled 2018-11-18: qty 16

## 2018-11-18 MED ORDER — DIPHENHYDRAMINE HCL 25 MG PO CAPS
50.0000 mg | ORAL_CAPSULE | Freq: Once | ORAL | Status: AC
  Administered 2018-11-18: 50 mg via ORAL

## 2018-11-18 MED ORDER — SODIUM CHLORIDE 0.9% FLUSH
10.0000 mL | INTRAVENOUS | Status: DC | PRN
  Administered 2018-11-18: 10 mL
  Filled 2018-11-18: qty 10

## 2018-11-18 MED ORDER — DIPHENHYDRAMINE HCL 25 MG PO CAPS
ORAL_CAPSULE | ORAL | Status: AC
  Filled 2018-11-18: qty 2

## 2018-11-18 MED ORDER — SODIUM CHLORIDE 0.9 % IV SOLN
Freq: Once | INTRAVENOUS | Status: AC
  Administered 2018-11-18: 15:00:00 via INTRAVENOUS
  Filled 2018-11-18: qty 250

## 2018-11-18 MED ORDER — ACETAMINOPHEN 325 MG PO TABS
650.0000 mg | ORAL_TABLET | Freq: Once | ORAL | Status: AC
  Administered 2018-11-18: 650 mg via ORAL

## 2018-11-18 MED ORDER — SODIUM CHLORIDE 0.9 % IV SOLN
420.0000 mg | Freq: Once | INTRAVENOUS | Status: AC
  Administered 2018-11-18: 420 mg via INTRAVENOUS
  Filled 2018-11-18: qty 14

## 2018-11-18 MED ORDER — HEPARIN SOD (PORK) LOCK FLUSH 100 UNIT/ML IV SOLN
500.0000 [IU] | Freq: Once | INTRAVENOUS | Status: AC | PRN
  Administered 2018-11-18: 500 [IU]
  Filled 2018-11-18: qty 5

## 2018-11-18 NOTE — Patient Instructions (Signed)
[facility] Discharge Instructions for Patients Receiving Chemotherapy  Today you received the following chemotherapy agents Herceptin and Perjeta.   To help prevent nausea and vomiting after your treatment, we encourage you to take your nausea medication as directed.   If you develop nausea and vomiting that is not controlled by your nausea medication, call the clinic.   BELOW ARE SYMPTOMS THAT SHOULD BE REPORTED IMMEDIATELY:  *FEVER GREATER THAN 100.5 F  *CHILLS WITH OR WITHOUT FEVER  NAUSEA AND VOMITING THAT IS NOT CONTROLLED WITH YOUR NAUSEA MEDICATION  *UNUSUAL SHORTNESS OF BREATH  *UNUSUAL BRUISING OR BLEEDING  TENDERNESS IN MOUTH AND THROAT WITH OR WITHOUT PRESENCE OF ULCERS  *URINARY PROBLEMS  *BOWEL PROBLEMS  UNUSUAL RASH Items with * indicate a potential emergency and should be followed up as soon as possible.  Feel free to call the clinic should you have any questions or concerns. The clinic phone number is [phone].  Please show the CHEMO ALERT CARD at check-in to the Emergency Department and triage nurse.   

## 2018-11-25 ENCOUNTER — Other Ambulatory Visit: Payer: Self-pay | Admitting: Hematology and Oncology

## 2018-12-04 NOTE — Assessment & Plan Note (Signed)
05/01/18:Bilateral mastectomies: Left mastectomy: DCIS intermediate grade 0.9 cm margins negative Tis NX stage 0; right mastectomy: ILC, grade 3, 3.5 cm, with LCIS, perineural invasion present, margins negative, 15/22 lymph nodes positive with extracapsular extension, ER 90%, PR 0%, HER-2 +(IHC 3+), Ki-67 5%, T2N3M1Stage IV  06/03/2018 bone scan: Increased tracer uptake in the lumbar spine at L1, L3 and L4, right fourth and seventh ribs, inferior right scapula and distal sternum, T7 and T11 as well.  CT CAPshowed widespread bone metastases with possible pathologic fracture at L3, peripheral right upper lobe nodules nonspecific Patient has cerebral palsy and hersisteris the power of attorney for healthcare Treatment plan: Palliative treatment with Taxolx12 cycles completed 08/26/2018,Kingenti, Perjeta ------------------------------------------------------------------------------------------------------------------------ PET CT scan: 10/27/2018  Radiology review: Widespread bone metastatic disease demonstrates reduced with sclerosis and low metabolic activity with SUV of 3-4.  Left level 4 lymph node 0.5 cm and left deep parotid lymph node SUV 4.1 will need follow-up.  Uterine leiomyomas.  Current treatment: Kingenti maintenance along with anastrozole  Anastrozole toxicities: Denies any hot flashes or myalgias. Kingenti toxicities: None  Return to clinic every 4 weeks for Kingenti and every 8 weeks for follow-up with me  Repeat scan towards the end of September  

## 2018-12-08 NOTE — Progress Notes (Signed)
Patient Care Team: Janora Norlander, DO as PCP - General (Family Medicine) Danie Binder, MD (Gastroenterology) Alphonsa Overall, MD as Consulting Physician (General Surgery) Nicholas Lose, MD as Consulting Physician (Hematology and Oncology) Kyung Rudd, MD as Consulting Physician (Radiation Oncology) Mauro Kaufmann, RN as Oncology Nurse Navigator Rockwell Germany, RN as Oncology Nurse Navigator  DIAGNOSIS:    ICD-10-CM   1. Malignant neoplasm of upper-outer quadrant of right breast in female, estrogen receptor positive ([place])  C50.411    Z17.0     SUMMARY OF ONCOLOGIC HISTORY: Oncology History  Malignant neoplasm of upper-outer quadrant of right breast in female, estrogen receptor positive ([place])  03/18/2018 Initial Diagnosis   Screening detected bilateral breast abnormalities.  Right breast mass UOQ 2.1 cm at 10 o'clock position: Grade 1 ILC ER 70%, PR 0%, HER-2 +3+ by IHC, Ki-67 5%, suspected lymph node in the axilla could not be biopsied; left breast calcifications by ultrasound not visible but 5 cm cystic/tubular structure which on biopsy was fibrocystic change with ADHl; T2N0 stage IIa clinical stage   05/01/2018 Surgery   Bilateral mastectomies: Left mastectomy: DCIS intermediate grade 0.9 cm margins negative Tis NX stage 0; right mastectomy: ILC, grade 3, 3.5 cm, with LCIS, perineural invasion present, margins negative, 15/22 lymph nodes positive with extracapsular extension, ER 90%, PR 0%, HER-2 +(IHC 3+), Ki-67 5%, T2N3 Stage 3B    05/11/2018 Cancer Staging   Staging form: Breast, AJCC 8th Edition - Pathologic stage from 05/11/2018: Stage IIIB (pT2, pN3, cM0, G3, ER+, PR-, HER2+) - Signed by Nicholas Lose, MD on 05/11/2018   06/03/2018 Imaging   Bone scan: Increased tracer uptake in the lumbar spine at L1, L3 and L4, right fourth and seventh ribs, inferior right scapula and distal sternum, T7 and T11 as well.   CT CAP showed widespread bone metastases with possible  pathologic fracture at L3, peripheral right upper lobe nodules nonspecific   06/10/2018 -  Chemotherapy   Taxol Kingenti and Perjeta    10/27/2018 PET scan   Bone metastases less metabolically active.  Couple of lymph nodes with low-level metabolic activity noted.     CHIEF COMPLIANT: Follow-up of Kingenti maintenance and tamoxifen  INTERVAL HISTORY: [name] is a 53 y.o. with above-mentioned history of metastatic breast cancer currently on treatment withmaintenance Kingenti and on anti-estrogen therapy with tamoxifen. Echo on 11/04/18 showed an ejection fraction in the range of 55-60%. She presents to the clinic today for treatment.  Her mom is with her today.  She has not had any problems from anastrozole or Kingenti.  REVIEW OF SYSTEMS:   Constitutional: Denies fevers, chills or abnormal weight loss Eyes: Denies blurriness of vision Ears, nose, mouth, throat, and face: Denies mucositis or sore throat Respiratory: Denies cough, dyspnea or wheezes Cardiovascular: Denies palpitation, chest discomfort Gastrointestinal: Denies nausea, heartburn or change in bowel habits Skin: Denies abnormal skin rashes Lymphatics: Denies new lymphadenopathy or easy bruising Extremities: No lower extremity edema Breast: denies any pain or lumps or nodules in either breasts All other systems were reviewed with the patient and are negative.  I have reviewed the past medical history, past surgical history, social history and family history with the patient and they are unchanged from previous note.  ALLERGIES:  is allergic to hydrocodone.  MEDICATIONS:  Current Outpatient Medications  Medication Sig Dispense Refill  . diphenoxylate-atropine (LOMOTIL) 2.5-0.025 MG tablet 1 to 2 tablets 4 times daily prn diarrhea 40 tablet 2  . ibuprofen (ADVIL,MOTRIN)  600 MG tablet Take 1 tablet (600 mg total) by mouth every 6 (six) hours as needed (for mild pain not relieved by other medications.). 30 tablet 0  .  lisinopril (ZESTRIL) 40 MG tablet Take 1 tablet (40 mg total) by mouth daily. 90 tablet 3  . megestrol (MEGACE) 40 MG tablet TAKE 3 TABLETS EVERY DAY 270 tablet 11  . megestrol (MEGACE) 40 MG tablet Take 1 tablet (40 mg total) by mouth 2 (two) times daily. Take 3 tablets every day 180 tablet 3  . ondansetron (ZOFRAN-ODT) 4 MG disintegrating tablet Take 1 tablet (4 mg total) by mouth every 6 (six) hours as needed for nausea. 15 tablet 0  . pantoprazole (PROTONIX) 40 MG tablet TAKE 1 TABLET BY MOUTH 30 MINUTES PRIOR TO BREAKFAST AND SUPPER 180 tablet 3  . polyethylene glycol (MIRALAX / GLYCOLAX) packet Take 17 g by mouth daily as needed for moderate constipation.    . potassium chloride (K-DUR) 10 MEQ tablet TAKE 1 TABLET BY MOUTH EVERY DAY 30 tablet 1  . pravastatin (PRAVACHOL) 40 MG tablet Take 1 tablet (40 mg total) by mouth daily. (Needs to be seen before next refill) 90 tablet 3  . tamoxifen (NOLVADEX) 20 MG tablet Take 1 tablet (20 mg total) by mouth daily. 90 tablet 3   No current facility-administered medications for this visit.     PHYSICAL EXAMINATION: ECOG PERFORMANCE STATUS: 0 - Asymptomatic  Vitals:   12/09/18 1350  BP: (!) 163/98  Pulse: 78  Resp: 17  Temp: 98.3 F (36.8 C)  SpO2: 100%   Filed Weights   12/09/18 1350  Weight: 121 lb 11.2 oz (55.2 kg)    Physical exam not done due to COVID-19 precautions  LABORATORY DATA:  I have reviewed the data as listed CMP Latest Ref Rng & Units 10/28/2018 09/16/2018 08/26/2018  Glucose 70 - 99 mg/dL 115(H) 112(H) 103(H)  BUN 6 - 20 mg/dL _0 Creatinine 0.44 - 1.00 mg/dL 0.69 0.64 0.62  Sodium 135 - 145 mmol/L 143 144 138  Potassium 3.5 - 5.1 mmol/L 3.5 3.8 3.5  Chloride 98 - 111 mmol/L 112(H) 111 103  CO2 22 - 32 mmol/L _1 Calcium 8.9 - 10.3 mg/dL 9.0 8.9 9.0  Total Protein 6.5 - 8.1 g/dL 7.3 7.4 7.2  Total Bilirubin 0.3 - 1.2 mg/dL 0.3 <0.2(L) 0.6  Alkaline Phos 38 - 126 U/L 61 76 76  AST 15 - 41 U/L 12(L)  14(L) 12(L)  ALT 0 - 44 U/L _2 Lab Results  Component Value Date   WBC 7.1 12/09/2018   HGB 11.1 (L) 12/09/2018   HCT 34.1 (L) 12/09/2018   MCV 89.7 12/09/2018   PLT 320 12/09/2018   NEUTROABS 4.0 12/09/2018    ASSESSMENT & PLAN:  Malignant neoplasm of upper-outer quadrant of right breast in female, estrogen receptor positive ([place]) 05/01/18:Bilateral mastectomies: Left mastectomy: DCIS intermediate grade 0.9 cm margins negative Tis NX stage 0; right mastectomy: ILC, grade 3, 3.5 cm, with LCIS, perineural invasion present, margins negative, 15/22 lymph nodes positive with extracapsular extension, ER 90%, PR 0%, HER-2 +(IHC 3+), Ki-67 5%, T2N3M1Stage IV  06/03/2018 bone scan: Increased tracer uptake in the lumbar spine at L1, L3 and L4, right fourth and seventh ribs, inferior right scapula and distal sternum, T7 and T11 as well.  CT CAPshowed widespread bone metastases with possible pathologic fracture at L3, peripheral right upper lobe nodules nonspecific Patient has cerebral palsy  and hersisteris the power of attorney for healthcare Treatment plan: Palliative treatment with Taxolx12 cycles completed 08/26/2018,Kingenti, Perjeta ------------------------------------------------------------------------------------------------------------------------ PET CT scan: 10/27/2018: Widespread bone metastatic disease demonstrates reduced with sclerosis and low metabolic activity with SUV of 3-4.  Left level 4 lymph node 0.5 cm and left deep parotid lymph node SUV 4.1 will need follow-up.  Uterine leiomyomas.  Current treatment: Kingenti maintenance along with anastrozole  Anastrozole toxicities: Denies any hot flashes or myalgias. Kingenti toxicities: None  Return to clinic every 4 weeks for Kingenti and every 8 weeks for follow-up with me  PET CT scan at the end of the year.  No orders of the defined types were placed in this encounter.  The patient has a good understanding  of the overall plan. she agrees with it. she will call with any problems that may develop before the next visit here.  Nicholas Lose, MD 12/09/2018  Julious Oka Dorshimer am acting as scribe for Dr. Nicholas Lose.  I have reviewed the above documentation for accuracy and completeness, and I agree with the above.

## 2018-12-09 ENCOUNTER — Inpatient Hospital Stay: Payer: Medicare Other

## 2018-12-09 ENCOUNTER — Encounter: Payer: Self-pay | Admitting: *Deleted

## 2018-12-09 ENCOUNTER — Other Ambulatory Visit: Payer: Self-pay

## 2018-12-09 ENCOUNTER — Inpatient Hospital Stay (HOSPITAL_BASED_OUTPATIENT_CLINIC_OR_DEPARTMENT_OTHER): Payer: Medicare Other | Admitting: Hematology and Oncology

## 2018-12-09 VITALS — BP 146/90

## 2018-12-09 DIAGNOSIS — Z79899 Other long term (current) drug therapy: Secondary | ICD-10-CM

## 2018-12-09 DIAGNOSIS — Z170 Estrogen receptor positive status [ER+]: Secondary | ICD-10-CM

## 2018-12-09 DIAGNOSIS — Z79811 Long term (current) use of aromatase inhibitors: Secondary | ICD-10-CM

## 2018-12-09 DIAGNOSIS — C50411 Malignant neoplasm of upper-outer quadrant of right female breast: Secondary | ICD-10-CM

## 2018-12-09 DIAGNOSIS — Z5112 Encounter for antineoplastic immunotherapy: Secondary | ICD-10-CM | POA: Diagnosis not present

## 2018-12-09 DIAGNOSIS — G809 Cerebral palsy, unspecified: Secondary | ICD-10-CM

## 2018-12-09 DIAGNOSIS — Z95828 Presence of other vascular implants and grafts: Secondary | ICD-10-CM

## 2018-12-09 DIAGNOSIS — C7951 Secondary malignant neoplasm of bone: Secondary | ICD-10-CM | POA: Diagnosis not present

## 2018-12-09 DIAGNOSIS — Z7189 Other specified counseling: Secondary | ICD-10-CM

## 2018-12-09 LAB — CMP (CANCER CENTER ONLY)
ALT: 12 U/L (ref 0–44)
AST: 12 U/L — ABNORMAL LOW (ref 15–41)
Albumin: 3.7 g/dL (ref 3.5–5.0)
Alkaline Phosphatase: 72 U/L (ref 38–126)
Anion gap: 7 (ref 5–15)
BUN: 13 mg/dL (ref 6–20)
CO2: 26 mmol/L (ref 22–32)
Calcium: 9.1 mg/dL (ref 8.9–10.3)
Chloride: 109 mmol/L (ref 98–111)
Creatinine: 0.71 mg/dL (ref 0.44–1.00)
GFR, Est AFR Am: >60 mL/min (ref >60)
GFR, Estimated: >60 mL/min (ref >60)
Glucose, Bld: 91 mg/dL (ref 70–99)
Potassium: 3.2 mmol/L — ABNORMAL LOW (ref 3.5–5.1)
Sodium: 142 mmol/L (ref 135–145)
Total Bilirubin: 0.3 mg/dL (ref 0.3–1.2)
Total Protein: 7.2 g/dL (ref 6.5–8.1)

## 2018-12-09 LAB — CBC WITH DIFFERENTIAL (CANCER CENTER ONLY)
Abs Immature Granulocytes: 0.03 10*3/uL (ref 0.00–0.07)
Basophils Absolute: 0 10*3/uL (ref 0.0–0.1)
Basophils Relative: 0 %
Eosinophils Absolute: 0.1 10*3/uL (ref 0.0–0.5)
Eosinophils Relative: 1 %
HCT: 34.1 % — ABNORMAL LOW (ref 36.0–46.0)
Hemoglobin: 11.1 g/dL — ABNORMAL LOW (ref 12.0–15.0)
Immature Granulocytes: 0 %
Lymphocytes Relative: 36 %
Lymphs Abs: 2.5 10*3/uL (ref 0.7–4.0)
MCH: 29.2 pg (ref 26.0–34.0)
MCHC: 32.6 g/dL (ref 30.0–36.0)
MCV: 89.7 fL (ref 80.0–100.0)
Monocytes Absolute: 0.5 10*3/uL (ref 0.1–1.0)
Monocytes Relative: 7 %
Neutro Abs: 4 10*3/uL (ref 1.7–7.7)
Neutrophils Relative %: 56 %
Platelet Count: 320 10*3/uL (ref 150–400)
RBC: 3.8 MIL/uL — ABNORMAL LOW (ref 3.87–5.11)
RDW: 13.2 % (ref 11.5–15.5)
WBC Count: 7.1 10*3/uL (ref 4.0–10.5)
nRBC: 0 % (ref 0.0–0.2)

## 2018-12-09 MED ORDER — HEPARIN SOD (PORK) LOCK FLUSH 100 UNIT/ML IV SOLN
500.0000 [IU] | Freq: Once | INTRAVENOUS | Status: AC | PRN
  Administered 2018-12-09: 17:00:00 500 [IU]
  Filled 2018-12-09: qty 5

## 2018-12-09 MED ORDER — DIPHENHYDRAMINE HCL 25 MG PO CAPS
50.0000 mg | ORAL_CAPSULE | Freq: Once | ORAL | Status: AC
  Administered 2018-12-09: 50 mg via ORAL

## 2018-12-09 MED ORDER — DIPHENHYDRAMINE HCL 25 MG PO CAPS
ORAL_CAPSULE | ORAL | Status: AC
  Filled 2018-12-09: qty 2

## 2018-12-09 MED ORDER — ACETAMINOPHEN 325 MG PO TABS
ORAL_TABLET | ORAL | Status: AC
  Filled 2018-12-09: qty 2

## 2018-12-09 MED ORDER — ACETAMINOPHEN 325 MG PO TABS
650.0000 mg | ORAL_TABLET | Freq: Once | ORAL | Status: AC
  Administered 2018-12-09: 650 mg via ORAL

## 2018-12-09 MED ORDER — TRASTUZUMAB CHEMO 150 MG IV SOLR
6.0000 mg/kg | Freq: Once | INTRAVENOUS | Status: AC
  Administered 2018-12-09: 336 mg via INTRAVENOUS
  Filled 2018-12-09: qty 16

## 2018-12-09 MED ORDER — SODIUM CHLORIDE 0.9% FLUSH
10.0000 mL | INTRAVENOUS | Status: DC | PRN
  Administered 2018-12-09: 10 mL
  Filled 2018-12-09: qty 10

## 2018-12-09 MED ORDER — SODIUM CHLORIDE 0.9 % IV SOLN
420.0000 mg | Freq: Once | INTRAVENOUS | Status: AC
  Administered 2018-12-09: 420 mg via INTRAVENOUS
  Filled 2018-12-09: qty 14

## 2018-12-09 MED ORDER — SODIUM CHLORIDE 0.9% FLUSH
10.0000 mL | INTRAVENOUS | Status: DC | PRN
  Administered 2018-12-09: 17:00:00 10 mL
  Filled 2018-12-09: qty 10

## 2018-12-09 MED ORDER — SODIUM CHLORIDE 0.9 % IV SOLN
Freq: Once | INTRAVENOUS | Status: AC
  Administered 2018-12-09: 15:00:00 via INTRAVENOUS
  Filled 2018-12-09: qty 250

## 2018-12-09 NOTE — Patient Instructions (Signed)
[facility] Discharge Instructions for Patients Receiving Chemotherapy  Today you received the following chemotherapy agents Herceptin and Perjeta.   To help prevent nausea and vomiting after your treatment, we encourage you to take your nausea medication as directed.   If you develop nausea and vomiting that is not controlled by your nausea medication, call the clinic.   BELOW ARE SYMPTOMS THAT SHOULD BE REPORTED IMMEDIATELY:  *FEVER GREATER THAN 100.5 F  *CHILLS WITH OR WITHOUT FEVER  NAUSEA AND VOMITING THAT IS NOT CONTROLLED WITH YOUR NAUSEA MEDICATION  *UNUSUAL SHORTNESS OF BREATH  *UNUSUAL BRUISING OR BLEEDING  TENDERNESS IN MOUTH AND THROAT WITH OR WITHOUT PRESENCE OF ULCERS  *URINARY PROBLEMS  *BOWEL PROBLEMS  UNUSUAL RASH Items with * indicate a potential emergency and should be followed up as soon as possible.  Feel free to call the clinic should you have any questions or concerns. The clinic phone number is [phone].  Please show the CHEMO ALERT CARD at check-in to the Emergency Department and triage nurse.   

## 2018-12-10 ENCOUNTER — Telehealth: Payer: Self-pay | Admitting: Hematology and Oncology

## 2018-12-10 NOTE — Telephone Encounter (Signed)
I talk with patient regarding schedule  

## 2018-12-18 ENCOUNTER — Other Ambulatory Visit: Payer: Self-pay | Admitting: Hematology and Oncology

## 2018-12-30 ENCOUNTER — Ambulatory Visit: Payer: Medicare Other

## 2019-01-04 ENCOUNTER — Other Ambulatory Visit: Payer: Self-pay | Admitting: Hematology and Oncology

## 2019-01-04 NOTE — Progress Notes (Signed)
The following biosimilar Kanjinti (trastuzumab-anns) has been selected for use in this patient.  Kennith Center, Pharm.D., CPP 01/04/2019@4 :54 PM

## 2019-01-05 ENCOUNTER — Encounter: Payer: Self-pay | Admitting: *Deleted

## 2019-01-06 ENCOUNTER — Ambulatory Visit: Payer: Medicare Other

## 2019-01-06 ENCOUNTER — Other Ambulatory Visit: Payer: Self-pay

## 2019-01-06 ENCOUNTER — Inpatient Hospital Stay: Payer: Medicare Other | Attending: Hematology and Oncology

## 2019-01-06 VITALS — BP 140/77 | HR 75 | Temp 98.6°F | Resp 17

## 2019-01-06 DIAGNOSIS — Z9013 Acquired absence of bilateral breasts and nipples: Secondary | ICD-10-CM | POA: Insufficient documentation

## 2019-01-06 DIAGNOSIS — C7951 Secondary malignant neoplasm of bone: Secondary | ICD-10-CM | POA: Insufficient documentation

## 2019-01-06 DIAGNOSIS — Z9221 Personal history of antineoplastic chemotherapy: Secondary | ICD-10-CM | POA: Diagnosis not present

## 2019-01-06 DIAGNOSIS — Z7189 Other specified counseling: Secondary | ICD-10-CM

## 2019-01-06 DIAGNOSIS — Z5112 Encounter for antineoplastic immunotherapy: Secondary | ICD-10-CM | POA: Insufficient documentation

## 2019-01-06 DIAGNOSIS — Z170 Estrogen receptor positive status [ER+]: Secondary | ICD-10-CM | POA: Diagnosis not present

## 2019-01-06 DIAGNOSIS — C50411 Malignant neoplasm of upper-outer quadrant of right female breast: Secondary | ICD-10-CM | POA: Insufficient documentation

## 2019-01-06 MED ORDER — SODIUM CHLORIDE 0.9% FLUSH
10.0000 mL | INTRAVENOUS | Status: DC | PRN
  Administered 2019-01-06: 10 mL
  Filled 2019-01-06: qty 10

## 2019-01-06 MED ORDER — DIPHENHYDRAMINE HCL 25 MG PO CAPS
50.0000 mg | ORAL_CAPSULE | Freq: Once | ORAL | Status: AC
  Administered 2019-01-06: 10:00:00 50 mg via ORAL

## 2019-01-06 MED ORDER — TRASTUZUMAB CHEMO 150 MG IV SOLR
6.0000 mg/kg | Freq: Once | INTRAVENOUS | Status: AC
  Administered 2019-01-06: 10:00:00 336 mg via INTRAVENOUS
  Filled 2019-01-06: qty 16

## 2019-01-06 MED ORDER — ACETAMINOPHEN 325 MG PO TABS
650.0000 mg | ORAL_TABLET | Freq: Once | ORAL | Status: AC
  Administered 2019-01-06: 10:00:00 650 mg via ORAL

## 2019-01-06 MED ORDER — DIPHENHYDRAMINE HCL 25 MG PO CAPS
ORAL_CAPSULE | ORAL | Status: AC
  Filled 2019-01-06: qty 2

## 2019-01-06 MED ORDER — HEPARIN SOD (PORK) LOCK FLUSH 100 UNIT/ML IV SOLN
500.0000 [IU] | Freq: Once | INTRAVENOUS | Status: AC | PRN
  Administered 2019-01-06: 12:00:00 500 [IU]
  Filled 2019-01-06: qty 5

## 2019-01-06 MED ORDER — ACETAMINOPHEN 325 MG PO TABS
ORAL_TABLET | ORAL | Status: AC
  Filled 2019-01-06: qty 2

## 2019-01-06 MED ORDER — SODIUM CHLORIDE 0.9 % IV SOLN
Freq: Once | INTRAVENOUS | Status: AC
  Administered 2019-01-06: 10:00:00 via INTRAVENOUS
  Filled 2019-01-06: qty 250

## 2019-01-06 MED ORDER — SODIUM CHLORIDE 0.9 % IV SOLN
420.0000 mg | Freq: Once | INTRAVENOUS | Status: AC
  Administered 2019-01-06: 11:00:00 420 mg via INTRAVENOUS
  Filled 2019-01-06: qty 14

## 2019-01-06 NOTE — Patient Instructions (Signed)
[facility] Discharge Instructions for Patients Receiving Chemotherapy  Today you received the following chemotherapy agents Herceptin and Perjeta.   To help prevent nausea and vomiting after your treatment, we encourage you to take your nausea medication as directed.   If you develop nausea and vomiting that is not controlled by your nausea medication, call the clinic.   BELOW ARE SYMPTOMS THAT SHOULD BE REPORTED IMMEDIATELY:  *FEVER GREATER THAN 100.5 F  *CHILLS WITH OR WITHOUT FEVER  NAUSEA AND VOMITING THAT IS NOT CONTROLLED WITH YOUR NAUSEA MEDICATION  *UNUSUAL SHORTNESS OF BREATH  *UNUSUAL BRUISING OR BLEEDING  TENDERNESS IN MOUTH AND THROAT WITH OR WITHOUT PRESENCE OF ULCERS  *URINARY PROBLEMS  *BOWEL PROBLEMS  UNUSUAL RASH Items with * indicate a potential emergency and should be followed up as soon as possible.  Feel free to call the clinic should you have any questions or concerns. The clinic phone number is [phone].  Please show the CHEMO ALERT CARD at check-in to the Emergency Department and triage nurse.   

## 2019-01-07 ENCOUNTER — Other Ambulatory Visit: Payer: Self-pay | Admitting: Hematology and Oncology

## 2019-01-16 ENCOUNTER — Other Ambulatory Visit: Payer: Self-pay | Admitting: Hematology and Oncology

## 2019-02-02 NOTE — Progress Notes (Signed)
Patient Care Team: Janora Norlander, DO as PCP - General (Family Medicine) Danie Binder, MD (Gastroenterology) Alphonsa Overall, MD as Consulting Physician (General Surgery) Nicholas Lose, MD as Consulting Physician (Hematology and Oncology) Kyung Rudd, MD as Consulting Physician (Radiation Oncology) Mauro Kaufmann, RN as Oncology Nurse Navigator Rockwell Germany, RN as Oncology Nurse Navigator  DIAGNOSIS:    ICD-10-CM   1. Bone metastases ([facility])  C79.51 NM PET Image Restag (PS) Skull Base To Thigh  2. Malignant neoplasm of upper-outer quadrant of right breast in female, estrogen receptor positive ([place])  C50.411 NM PET Image Restag (PS) Skull Base To Thigh   Z17.0     SUMMARY OF ONCOLOGIC HISTORY: Oncology History  Malignant neoplasm of upper-outer quadrant of right breast in female, estrogen receptor positive ([place])  03/18/2018 Initial Diagnosis   Screening detected bilateral breast abnormalities.  Right breast mass UOQ 2.1 cm at 10 o'clock position: Grade 1 ILC ER 70%, PR 0%, HER-2 +3+ by IHC, Ki-67 5%, suspected lymph node in the axilla could not be biopsied; left breast calcifications by ultrasound not visible but 5 cm cystic/tubular structure which on biopsy was fibrocystic change with ADHl; T2N0 stage IIa clinical stage   05/01/2018 Surgery   Bilateral mastectomies: Left mastectomy: DCIS intermediate grade 0.9 cm margins negative Tis NX stage 0; right mastectomy: ILC, grade 3, 3.5 cm, with LCIS, perineural invasion present, margins negative, 15/22 lymph nodes positive with extracapsular extension, ER 90%, PR 0%, HER-2 +(IHC 3+), Ki-67 5%, T2N3 Stage 3B    05/11/2018 Cancer Staging   Staging form: Breast, AJCC 8th Edition - Pathologic stage from 05/11/2018: Stage IIIB (pT2, pN3, cM0, G3, ER+, PR-, HER2+) - Signed by Nicholas Lose, MD on 05/11/2018   06/03/2018 Imaging   Bone scan: Increased tracer uptake in the lumbar spine at L1, L3 and L4, right fourth and seventh ribs,  inferior right scapula and distal sternum, T7 and T11 as well.   CT CAP showed widespread bone metastases with possible pathologic fracture at L3, peripheral right upper lobe nodules nonspecific   06/10/2018 -  Chemotherapy   Taxol Kingenti and Perjeta    10/27/2018 PET scan   Bone metastases less metabolically active.  Couple of lymph nodes with low-level metabolic activity noted.     CHIEF COMPLIANT: Follow-up of Kingenti maintenance and anastrozole  INTERVAL HISTORY: [name] is a 53 y.o. with above-mentioned history of metastatic breast cancer currently on treatment withmaintenance Kingenti and on anti-estrogen therapy with anastrozole. She presents to the clinic today for treatment.   Patient is complaining of sternal pain intermittently.  She takes occasional Tylenol.  Her mom feels that she sleeps in a sitting position and that is why it hurts her there.  She does have metastatic disease in the sternum.  REVIEW OF SYSTEMS:   Constitutional: Denies fevers, chills or abnormal weight loss, sternal pain Eyes: Denies blurriness of vision Ears, nose, mouth, throat, and face: Denies mucositis or sore throat Respiratory: Denies cough, dyspnea or wheezes Cardiovascular: Denies palpitation, chest discomfort Gastrointestinal: Denies nausea, heartburn or change in bowel habits Skin: Denies abnormal skin rashes Lymphatics: Denies new lymphadenopathy or easy bruising Neurological: Denies numbness, tingling or new weaknesses Behavioral/Psych: Mood is stable, no new changes  Extremities: No lower extremity edema Breast: denies any pain or lumps or nodules in either breasts All other systems were reviewed with the patient and are negative.  I have reviewed the past medical history, past surgical history, social history and  family history with the patient and they are unchanged from previous note.  ALLERGIES:  is allergic to hydrocodone.  MEDICATIONS:  Current Outpatient Medications   Medication Sig Dispense Refill  . diphenoxylate-atropine (LOMOTIL) 2.5-0.025 MG tablet TAKE 1 TO 2 TABLETS BY MOUTH 4 TIMES DAILY AS NEEDED DIARRHEA 40 tablet 2  . ibuprofen (ADVIL,MOTRIN) 600 MG tablet Take 1 tablet (600 mg total) by mouth every 6 (six) hours as needed (for mild pain not relieved by other medications.). 30 tablet 0  . lisinopril (ZESTRIL) 40 MG tablet Take 1 tablet (40 mg total) by mouth daily. 90 tablet 3  . megestrol (MEGACE) 40 MG tablet TAKE 3 TABLETS EVERY DAY 270 tablet 11  . megestrol (MEGACE) 40 MG tablet Take 1 tablet (40 mg total) by mouth 2 (two) times daily. Take 3 tablets every day 180 tablet 3  . ondansetron (ZOFRAN-ODT) 4 MG disintegrating tablet Take 1 tablet (4 mg total) by mouth every 6 (six) hours as needed for nausea. 15 tablet 0  . pantoprazole (PROTONIX) 40 MG tablet TAKE 1 TABLET BY MOUTH 30 MINUTES PRIOR TO BREAKFAST AND SUPPER 180 tablet 3  . polyethylene glycol (MIRALAX / GLYCOLAX) packet Take 17 g by mouth daily as needed for moderate constipation.    . potassium chloride (K-DUR) 10 MEQ tablet TAKE 1 TABLET BY MOUTH EVERY DAY 30 tablet 1  . pravastatin (PRAVACHOL) 40 MG tablet Take 1 tablet (40 mg total) by mouth daily. (Needs to be seen before next refill) 90 tablet 3  . tamoxifen (NOLVADEX) 20 MG tablet Take 1 tablet (20 mg total) by mouth daily. 90 tablet 3   No current facility-administered medications for this visit.    Facility-Administered Medications Ordered in Other Visits  Medication Dose Route Frequency Provider Last Rate Last Dose  . acetaminophen (TYLENOL) tablet 650 mg  650 mg Oral Once Nicholas Lose, MD      . diphenhydrAMINE (BENADRYL) capsule 50 mg  50 mg Oral Once Nicholas Lose, MD      . heparin lock flush 100 unit/mL  500 Units Intracatheter Once PRN Nicholas Lose, MD      . pertuzumab (PERJETA) 420 mg in sodium chloride 0.9 % 250 mL chemo infusion  420 mg Intravenous Once Nicholas Lose, MD      . sodium chloride flush (NS) 0.9 %  injection 10 mL  10 mL Intracatheter PRN Nicholas Lose, MD      . Theotis [facility]) 336 mg in sodium chloride 0.9 % 250 mL chemo infusion  6 mg/kg (Treatment Plan Recorded) Intravenous Once Nicholas Lose, MD        PHYSICAL EXAMINATION: ECOG PERFORMANCE STATUS: 1 - Symptomatic but completely ambulatory  Vitals:   02/03/19 1405  BP: (!) 154/112  Pulse: 90  Resp: 18  Temp: 99.1 F (37.3 C)  SpO2: 100%   Filed Weights   02/03/19 1405  Weight: 123 lb 9.6 oz (56.1 kg)    GENERAL: alert, no distress and comfortable SKIN: skin color, texture, turgor are normal, no rashes or significant lesions EYES: normal, Conjunctiva are pink and non-injected, sclera clear OROPHARYNX: no exudate, no erythema and lips, buccal mucosa, and tongue normal  NECK: supple, thyroid normal size, non-tender, without nodularity LYMPH: no palpable lymphadenopathy in the cervical, axillary or inguinal LUNGS: clear to auscultation and percussion with normal breathing effort HEART: regular rate & rhythm and no murmurs and no lower extremity edema ABDOMEN: abdomen soft, non-tender and normal bowel sounds MUSCULOSKELETAL: no cyanosis of digits and  no clubbing  NEURO: alert & oriented x 3 with fluent speech, no focal motor/sensory deficits EXTREMITIES: No lower extremity edema  LABORATORY DATA:  I have reviewed the data as listed CMP Latest Ref Rng & Units 02/03/2019 12/09/2018 10/28/2018  Glucose 70 - 99 mg/dL 97 91 115(H)  BUN 6 - 20 mg/dL _0 Creatinine 0.44 - 1.00 mg/dL 0.66 0.71 0.69  Sodium 135 - 145 mmol/L 141 142 143  Potassium 3.5 - 5.1 mmol/L 3.6 3.2(L) 3.5  Chloride 98 - 111 mmol/L 109 109 112(H)  CO2 22 - 32 mmol/L _1 Calcium 8.9 - 10.3 mg/dL 9.1 9.1 9.0  Total Protein 6.5 - 8.1 g/dL 7.2 7.2 7.3  Total Bilirubin 0.3 - 1.2 mg/dL 0.3 0.3 0.3  Alkaline Phos 38 - 126 U/L 71 72 61  AST 15 - 41 U/L 11(L) 12(L) 12(L)  ALT 0 - 44 U/L _2 Lab Results  Component Value Date    WBC 7.6 02/03/2019   HGB 11.7 (L) 02/03/2019   HCT 35.6 (L) 02/03/2019   MCV 91.0 02/03/2019   PLT 341 02/03/2019   NEUTROABS 4.3 02/03/2019    ASSESSMENT & PLAN:  Malignant neoplasm of upper-outer quadrant of right breast in female, estrogen receptor positive ([place]) 05/01/18:Bilateral mastectomies: Left mastectomy: DCIS intermediate grade 0.9 cm margins negative Tis NX stage 0; right mastectomy: ILC, grade 3, 3.5 cm, with LCIS, perineural invasion present, margins negative, 15/22 lymph nodes positive with extracapsular extension, ER 90%, PR 0%, HER-2 +(IHC 3+), Ki-67 5%, T2N3M1Stage IV  06/03/2018 bone scan: Increased tracer uptake in the lumbar spine at L1, L3 and L4, right fourth and seventh ribs, inferior right scapula and distal sternum, T7 and T11 as well.  CT CAPshowed widespread bone metastases with possible pathologic fracture at L3, peripheral right upper lobe nodules nonspecific Patient has cerebral palsy and hersisteris the power of attorney for healthcare Treatment plan: Palliative treatment with Taxolx12 cycles completed 08/26/2018,Kingenti, Perjeta ------------------------------------------------------------------------------------------------------------------------ PET CT scan:10/27/2018:Widespread bone metastatic disease demonstrates reduced with sclerosis and low metabolic activity with SUV of 3-4. Left level 4 lymph node 0.5 cm and left deep parotid lymph node SUV 4.1 will need follow-up. Uterine leiomyomas.  Current treatment:Kingenti maintenance along with anastrozole  Anastrozole toxicities: Denies any hot flashes or myalgias. Kingenti toxicities: None  Return to clinic every 4 weeks for Kingenti and every8weeks for follow-up with me  PET CT scan at the end of the year.    Orders Placed This Encounter  Procedures  . NM PET Image Restag (PS) Skull Base To Thigh    Standing Status:   Future    Standing Expiration Date:   02/03/2020    Order  Specific Question:   ** REASON FOR EXAM (FREE TEXT)    Answer:   Met breast cancer restaging    Order Specific Question:   If indicated for the ordered procedure, I authorize the administration of a radiopharmaceutical per Radiology protocol    Answer:   Yes    Order Specific Question:   Is the patient pregnant?    Answer:   No    Order Specific Question:   Preferred imaging location?    Answer:   [facility]    Order Specific Question:   Radiology Contrast Protocol - do NOT remove file path    Answer:   [email or web address]   The patient has a good understanding of the overall plan. she agrees with it. she will call  with any problems that may develop before the next visit here.  Nicholas Lose, MD 02/03/2019  Julious Oka Dorshimer am acting as scribe for Dr. Nicholas Lose.  I have reviewed the above documentation for accuracy and completeness, and I agree with the above.

## 2019-02-03 ENCOUNTER — Inpatient Hospital Stay: Payer: Medicare Other

## 2019-02-03 ENCOUNTER — Inpatient Hospital Stay: Payer: Medicare Other | Attending: Hematology and Oncology

## 2019-02-03 ENCOUNTER — Other Ambulatory Visit: Payer: Self-pay

## 2019-02-03 ENCOUNTER — Inpatient Hospital Stay (HOSPITAL_BASED_OUTPATIENT_CLINIC_OR_DEPARTMENT_OTHER): Payer: Medicare Other | Admitting: Hematology and Oncology

## 2019-02-03 VITALS — BP 160/90 | HR 87 | Temp 98.5°F | Resp 24

## 2019-02-03 VITALS — BP 154/112 | HR 90 | Temp 99.1°F | Resp 18 | Ht 62.0 in | Wt 123.6 lb

## 2019-02-03 DIAGNOSIS — Z170 Estrogen receptor positive status [ER+]: Secondary | ICD-10-CM

## 2019-02-03 DIAGNOSIS — Z5112 Encounter for antineoplastic immunotherapy: Secondary | ICD-10-CM | POA: Insufficient documentation

## 2019-02-03 DIAGNOSIS — G809 Cerebral palsy, unspecified: Secondary | ICD-10-CM | POA: Diagnosis not present

## 2019-02-03 DIAGNOSIS — Z23 Encounter for immunization: Secondary | ICD-10-CM | POA: Insufficient documentation

## 2019-02-03 DIAGNOSIS — C7951 Secondary malignant neoplasm of bone: Secondary | ICD-10-CM | POA: Insufficient documentation

## 2019-02-03 DIAGNOSIS — Z95828 Presence of other vascular implants and grafts: Secondary | ICD-10-CM

## 2019-02-03 DIAGNOSIS — Z791 Long term (current) use of non-steroidal anti-inflammatories (NSAID): Secondary | ICD-10-CM | POA: Diagnosis not present

## 2019-02-03 DIAGNOSIS — Z7189 Other specified counseling: Secondary | ICD-10-CM

## 2019-02-03 DIAGNOSIS — Z79810 Long term (current) use of selective estrogen receptor modulators (SERMs): Secondary | ICD-10-CM | POA: Diagnosis not present

## 2019-02-03 DIAGNOSIS — C50411 Malignant neoplasm of upper-outer quadrant of right female breast: Secondary | ICD-10-CM

## 2019-02-03 DIAGNOSIS — Z79899 Other long term (current) drug therapy: Secondary | ICD-10-CM | POA: Insufficient documentation

## 2019-02-03 DIAGNOSIS — Z9013 Acquired absence of bilateral breasts and nipples: Secondary | ICD-10-CM | POA: Insufficient documentation

## 2019-02-03 LAB — CBC WITH DIFFERENTIAL (CANCER CENTER ONLY)
Abs Immature Granulocytes: 0.02 10*3/uL (ref 0.00–0.07)
Basophils Absolute: 0.1 10*3/uL (ref 0.0–0.1)
Basophils Relative: 1 %
Eosinophils Absolute: 0.1 10*3/uL (ref 0.0–0.5)
Eosinophils Relative: 2 %
HCT: 35.6 % — ABNORMAL LOW (ref 36.0–46.0)
Hemoglobin: 11.7 g/dL — ABNORMAL LOW (ref 12.0–15.0)
Immature Granulocytes: 0 %
Lymphocytes Relative: 34 %
Lymphs Abs: 2.6 10*3/uL (ref 0.7–4.0)
MCH: 29.9 pg (ref 26.0–34.0)
MCHC: 32.9 g/dL (ref 30.0–36.0)
MCV: 91 fL (ref 80.0–100.0)
Monocytes Absolute: 0.5 10*3/uL (ref 0.1–1.0)
Monocytes Relative: 7 %
Neutro Abs: 4.3 10*3/uL (ref 1.7–7.7)
Neutrophils Relative %: 56 %
Platelet Count: 341 10*3/uL (ref 150–400)
RBC: 3.91 MIL/uL (ref 3.87–5.11)
RDW: 13.8 % (ref 11.5–15.5)
WBC Count: 7.6 10*3/uL (ref 4.0–10.5)
nRBC: 0 % (ref 0.0–0.2)

## 2019-02-03 LAB — CMP (CANCER CENTER ONLY)
ALT: 10 U/L (ref 0–44)
AST: 11 U/L — ABNORMAL LOW (ref 15–41)
Albumin: 3.9 g/dL (ref 3.5–5.0)
Alkaline Phosphatase: 71 U/L (ref 38–126)
Anion gap: 7 (ref 5–15)
BUN: 9 mg/dL (ref 6–20)
CO2: 25 mmol/L (ref 22–32)
Calcium: 9.1 mg/dL (ref 8.9–10.3)
Chloride: 109 mmol/L (ref 98–111)
Creatinine: 0.66 mg/dL (ref 0.44–1.00)
GFR, Est AFR Am: >60 mL/min (ref >60)
GFR, Estimated: >60 mL/min (ref >60)
Glucose, Bld: 97 mg/dL (ref 70–99)
Potassium: 3.6 mmol/L (ref 3.5–5.1)
Sodium: 141 mmol/L (ref 135–145)
Total Bilirubin: 0.3 mg/dL (ref 0.3–1.2)
Total Protein: 7.2 g/dL (ref 6.5–8.1)

## 2019-02-03 MED ORDER — DIPHENHYDRAMINE HCL 25 MG PO CAPS
50.0000 mg | ORAL_CAPSULE | Freq: Once | ORAL | Status: AC
  Administered 2019-02-03: 50 mg via ORAL

## 2019-02-03 MED ORDER — SODIUM CHLORIDE 0.9% FLUSH
10.0000 mL | INTRAVENOUS | Status: DC | PRN
  Administered 2019-02-03: 10 mL
  Filled 2019-02-03: qty 10

## 2019-02-03 MED ORDER — ACETAMINOPHEN 325 MG PO TABS
650.0000 mg | ORAL_TABLET | Freq: Once | ORAL | Status: AC
  Administered 2019-02-03: 650 mg via ORAL

## 2019-02-03 MED ORDER — SODIUM CHLORIDE 0.9% FLUSH
10.0000 mL | INTRAVENOUS | Status: DC | PRN
  Administered 2019-02-03: 18:00:00 10 mL
  Filled 2019-02-03: qty 10

## 2019-02-03 MED ORDER — TRASTUZUMAB-ANNS CHEMO 150 MG IV SOLR
6.0000 mg/kg | Freq: Once | INTRAVENOUS | Status: AC
  Administered 2019-02-03: 336 mg via INTRAVENOUS
  Filled 2019-02-03: qty 16

## 2019-02-03 MED ORDER — SODIUM CHLORIDE 0.9 % IV SOLN
Freq: Once | INTRAVENOUS | Status: AC
  Administered 2019-02-03: 15:00:00 via INTRAVENOUS
  Filled 2019-02-03: qty 250

## 2019-02-03 MED ORDER — DIPHENHYDRAMINE HCL 25 MG PO CAPS
ORAL_CAPSULE | ORAL | Status: AC
  Filled 2019-02-03: qty 2

## 2019-02-03 MED ORDER — HEPARIN SOD (PORK) LOCK FLUSH 100 UNIT/ML IV SOLN
500.0000 [IU] | Freq: Once | INTRAVENOUS | Status: AC | PRN
  Administered 2019-02-03: 500 [IU]
  Filled 2019-02-03: qty 5

## 2019-02-03 MED ORDER — ACETAMINOPHEN 325 MG PO TABS
ORAL_TABLET | ORAL | Status: AC
  Filled 2019-02-03: qty 2

## 2019-02-03 MED ORDER — INFLUENZA VAC SPLIT QUAD 0.5 ML IM SUSY
PREFILLED_SYRINGE | INTRAMUSCULAR | Status: AC
  Filled 2019-02-03: qty 0.5

## 2019-02-03 MED ORDER — SODIUM CHLORIDE 0.9 % IV SOLN
420.0000 mg | Freq: Once | INTRAVENOUS | Status: AC
  Administered 2019-02-03: 420 mg via INTRAVENOUS
  Filled 2019-02-03: qty 14

## 2019-02-03 MED ORDER — INFLUENZA VAC SPLIT QUAD 0.5 ML IM SUSY
0.5000 mL | PREFILLED_SYRINGE | Freq: Once | INTRAMUSCULAR | Status: AC
  Administered 2019-02-03: 0.5 mL via INTRAMUSCULAR

## 2019-02-03 NOTE — Assessment & Plan Note (Signed)
05/01/18:Bilateral mastectomies: Left mastectomy: DCIS intermediate grade 0.9 cm margins negative Tis NX stage 0; right mastectomy: ILC, grade 3, 3.5 cm, with LCIS, perineural invasion present, margins negative, 15/22 lymph nodes positive with extracapsular extension, ER 90%, PR 0%, HER-2 +(IHC 3+), Ki-67 5%, T2N3M1Stage IV  06/03/2018 bone scan: Increased tracer uptake in the lumbar spine at L1, L3 and L4, right fourth and seventh ribs, inferior right scapula and distal sternum, T7 and T11 as well.  CT CAPshowed widespread bone metastases with possible pathologic fracture at L3, peripheral right upper lobe nodules nonspecific Patient has cerebral palsy and hersisteris the power of attorney for healthcare Treatment plan: Palliative treatment with Taxolx12 cycles completed 08/26/2018,Kingenti, Perjeta ------------------------------------------------------------------------------------------------------------------------ PET CT scan:10/27/2018:Widespread bone metastatic disease demonstrates reduced with sclerosis and low metabolic activity with SUV of 3-4. Left level 4 lymph node 0.5 cm and left deep parotid lymph node SUV 4.1 will need follow-up. Uterine leiomyomas.  Current treatment:Kingenti maintenance along with anastrozole  Anastrozole toxicities: Denies any hot flashes or myalgias. Kingenti toxicities: None  Return to clinic every 4 weeks for Kingenti and every8weeks for follow-up with me  PET CT scan at the end of the year.

## 2019-02-03 NOTE — Progress Notes (Signed)
Echocardiogram orders placed.  

## 2019-02-03 NOTE — Patient Instructions (Signed)
[facility] Discharge Instructions for Patients Receiving Chemotherapy  Today you received the following chemotherapy agents Herceptin and Perjeta.   To help prevent nausea and vomiting after your treatment, we encourage you to take your nausea medication as directed.   If you develop nausea and vomiting that is not controlled by your nausea medication, call the clinic.   BELOW ARE SYMPTOMS THAT SHOULD BE REPORTED IMMEDIATELY:  *FEVER GREATER THAN 100.5 F  *CHILLS WITH OR WITHOUT FEVER  NAUSEA AND VOMITING THAT IS NOT CONTROLLED WITH YOUR NAUSEA MEDICATION  *UNUSUAL SHORTNESS OF BREATH  *UNUSUAL BRUISING OR BLEEDING  TENDERNESS IN MOUTH AND THROAT WITH OR WITHOUT PRESENCE OF ULCERS  *URINARY PROBLEMS  *BOWEL PROBLEMS  UNUSUAL RASH Items with * indicate a potential emergency and should be followed up as soon as possible.  Feel free to call the clinic should you have any questions or concerns. The clinic phone number is [phone].  Please show the CHEMO ALERT CARD at check-in to the Emergency Department and triage nurse.   

## 2019-02-10 ENCOUNTER — Other Ambulatory Visit: Payer: Self-pay | Admitting: Hematology and Oncology

## 2019-02-11 ENCOUNTER — Ambulatory Visit (HOSPITAL_COMMUNITY)
Admission: RE | Admit: 2019-02-11 | Discharge: 2019-02-11 | Disposition: A | Discharge status: Home / Self Care | Payer: Medicare Other | Source: Ambulatory Visit | Attending: Hematology and Oncology | Admitting: Hematology and Oncology

## 2019-02-11 ENCOUNTER — Other Ambulatory Visit: Payer: Self-pay

## 2019-02-11 DIAGNOSIS — C50411 Malignant neoplasm of upper-outer quadrant of right female breast: Secondary | ICD-10-CM | POA: Diagnosis not present

## 2019-02-11 DIAGNOSIS — Z170 Estrogen receptor positive status [ER+]: Secondary | ICD-10-CM | POA: Insufficient documentation

## 2019-02-11 DIAGNOSIS — I340 Nonrheumatic mitral (valve) insufficiency: Secondary | ICD-10-CM | POA: Insufficient documentation

## 2019-02-11 NOTE — Progress Notes (Signed)
  Echocardiogram 2D Echocardiogram has been performed.  [name] 02/11/2019, 9:54 AM

## 2019-03-02 NOTE — Progress Notes (Signed)
Patient Care Team: Janora Norlander, DO as PCP - General (Family Medicine) Danie Binder, MD (Gastroenterology) Alphonsa Overall, MD as Consulting Physician (General Surgery) Nicholas Lose, MD as Consulting Physician (Hematology and Oncology) Kyung Rudd, MD as Consulting Physician (Radiation Oncology) Mauro Kaufmann, RN as Oncology Nurse Navigator Rockwell Germany, RN as Oncology Nurse Navigator  DIAGNOSIS:    ICD-10-CM   1. Bone metastases (HCC)  C79.51 NM PET Image Restag (PS) Skull Base To Thigh  2. Malignant neoplasm of upper-outer quadrant of right breast in female, estrogen receptor positive ([place])  C50.411 NM PET Image Restag (PS) Skull Base To Thigh   Z17.0     SUMMARY OF ONCOLOGIC HISTORY: Oncology History  Malignant neoplasm of upper-outer quadrant of right breast in female, estrogen receptor positive ([place])  03/18/2018 Initial Diagnosis   Screening detected bilateral breast abnormalities.  Right breast mass UOQ 2.1 cm at 10 o'clock position: Grade 1 ILC ER 70%, PR 0%, HER-2 +3+ by IHC, Ki-67 5%, suspected lymph node in the axilla could not be biopsied; left breast calcifications by ultrasound not visible but 5 cm cystic/tubular structure which on biopsy was fibrocystic change with ADHl; T2N0 stage IIa clinical stage   05/01/2018 Surgery   Bilateral mastectomies: Left mastectomy: DCIS intermediate grade 0.9 cm margins negative Tis NX stage 0; right mastectomy: ILC, grade 3, 3.5 cm, with LCIS, perineural invasion present, margins negative, 15/22 lymph nodes positive with extracapsular extension, ER 90%, PR 0%, HER-2 +(IHC 3+), Ki-67 5%, T2N3 Stage 3B    05/11/2018 Cancer Staging   Staging form: Breast, AJCC 8th Edition - Pathologic stage from 05/11/2018: Stage IIIB (pT2, pN3, cM0, G3, ER+, PR-, HER2+) - Signed by Nicholas Lose, MD on 05/11/2018   06/03/2018 Imaging   Bone scan: Increased tracer uptake in the lumbar spine at L1, L3 and L4, right fourth and seventh ribs,  inferior right scapula and distal sternum, T7 and T11 as well.   CT CAP showed widespread bone metastases with possible pathologic fracture at L3, peripheral right upper lobe nodules nonspecific   06/10/2018 -  Chemotherapy   Taxol Kingenti and Perjeta    10/27/2018 PET scan   Bone metastases less metabolically active.  Couple of lymph nodes with low-level metabolic activity noted.     CHIEF COMPLIANT: Follow-up of Kingenti maintenance andanastrozole  INTERVAL HISTORY: [name] is a 53 y.o. with above-mentioned history of metastatic breast cancer currently on treatment withmaintenance Kingenti and on anti-estrogen therapy with anastrozole. Echo on 02/11/19 showed an ejection fraction of 60-65%. She presents to the clinic today for treatment.   She has had intermittent diarrhea but overall she is able to manage her diarrhea very well.  REVIEW OF SYSTEMS:   Constitutional: Denies fevers, chills or abnormal weight loss Eyes: Denies blurriness of vision Ears, nose, mouth, throat, and face: Denies mucositis or sore throat Respiratory: Denies cough, dyspnea or wheezes Cardiovascular: Denies palpitation, chest discomfort Gastrointestinal: Denies nausea, heartburn or change in bowel habits Skin: Denies abnormal skin rashes Lymphatics: Denies new lymphadenopathy or easy bruising Neurological: Generalized weakness Behavioral/Psych: Mood is stable, no new changes  Extremities: No lower extremity edema Breast: denies any pain or lumps or nodules in either breasts All other systems were reviewed with the patient and are negative.  I have reviewed the past medical history, past surgical history, social history and family history with the patient and they are unchanged from previous note.  ALLERGIES:  is allergic to hydrocodone.  MEDICATIONS:  Current Outpatient Medications  Medication Sig Dispense Refill  . diphenoxylate-atropine (LOMOTIL) 2.5-0.025 MG tablet TAKE 1 TO 2 TABLETS BY MOUTH 4  TIMES DAILY AS NEEDED DIARRHEA 40 tablet 2  . ibuprofen (ADVIL,MOTRIN) 600 MG tablet Take 1 tablet (600 mg total) by mouth every 6 (six) hours as needed (for mild pain not relieved by other medications.). 30 tablet 0  . lisinopril (ZESTRIL) 40 MG tablet Take 1 tablet (40 mg total) by mouth daily. 90 tablet 3  . megestrol (MEGACE) 40 MG tablet TAKE 3 TABLETS EVERY DAY 270 tablet 11  . megestrol (MEGACE) 40 MG tablet Take 1 tablet (40 mg total) by mouth 2 (two) times daily. Take 3 tablets every day 180 tablet 3  . ondansetron (ZOFRAN-ODT) 4 MG disintegrating tablet Take 1 tablet (4 mg total) by mouth every 6 (six) hours as needed for nausea. 15 tablet 0  . pantoprazole (PROTONIX) 40 MG tablet TAKE 1 TABLET BY MOUTH 30 MINUTES PRIOR TO BREAKFAST AND SUPPER 180 tablet 3  . polyethylene glycol (MIRALAX / GLYCOLAX) packet Take 17 g by mouth daily as needed for moderate constipation.    . potassium chloride (KLOR-CON) 10 MEQ tablet TAKE 1 TABLET BY MOUTH EVERY DAY 30 tablet 1  . pravastatin (PRAVACHOL) 40 MG tablet Take 1 tablet (40 mg total) by mouth daily. (Needs to be seen before next refill) 90 tablet 3  . tamoxifen (NOLVADEX) 20 MG tablet Take 1 tablet (20 mg total) by mouth daily. 90 tablet 3   No current facility-administered medications for this visit.    Facility-Administered Medications Ordered in Other Visits  Medication Dose Route Frequency Provider Last Rate Last Dose  . heparin lock flush 100 unit/mL  500 Units Intracatheter Once PRN Nicholas Lose, MD      . pertuzumab (PERJETA) 420 mg in sodium chloride 0.9 % 250 mL chemo infusion  420 mg Intravenous Once Nicholas Lose, MD      . sodium chloride flush (NS) 0.9 % injection 10 mL  10 mL Intracatheter PRN Nicholas Lose, MD      . trastuzumab (HERCEPTIN) 336 mg in sodium chloride 0.9 % 250 mL chemo infusion  6 mg/kg (Treatment Plan Recorded) Intravenous Once Nicholas Lose, MD        PHYSICAL EXAMINATION: ECOG PERFORMANCE STATUS: 1 -  Symptomatic but completely ambulatory  Vitals:   03/03/19 1041  BP: (!) 154/100  Pulse: 92  Resp: 18  Temp: 98.3 F (36.8 C)  SpO2: 100%   Filed Weights   03/03/19 1041  Weight: 125 lb 1.6 oz (56.7 kg)    GENERAL: alert, no distress and comfortable SKIN: skin color, texture, turgor are normal, no rashes or significant lesions EYES: normal, Conjunctiva are pink and non-injected, sclera clear OROPHARYNX: no exudate, no erythema and lips, buccal mucosa, and tongue normal  NECK: supple, thyroid normal size, non-tender, without nodularity LYMPH: no palpable lymphadenopathy in the cervical, axillary or inguinal LUNGS: clear to auscultation and percussion with normal breathing effort HEART: regular rate & rhythm and no murmurs and no lower extremity edema ABDOMEN: abdomen soft, non-tender and normal bowel sounds MUSCULOSKELETAL: no cyanosis of digits and no clubbing  NEURO: alert & oriented x 3 with fluent speech, no focal motor/sensory deficits EXTREMITIES: No lower extremity edema  LABORATORY DATA:  I have reviewed the data as listed CMP Latest Ref Rng & Units 03/03/2019 02/03/2019 12/09/2018  Glucose 70 - 99 mg/dL 94 97 91  BUN 6 - 20 mg/dL 12 9 13  Creatinine 0.44 - 1.00 mg/dL 0.63 0.66 0.71  Sodium 135 - 145 mmol/L 143 141 142  Potassium 3.5 - 5.1 mmol/L 3.6 3.6 3.2(L)  Chloride 98 - 111 mmol/L 109 109 109  CO2 22 - 32 mmol/L 23 25 26   Calcium 8.9 - 10.3 mg/dL 8.8(L) 9.1 9.1  Total Protein 6.5 - 8.1 g/dL 7.2 7.2 7.2  Total Bilirubin 0.3 - 1.2 mg/dL 0.4 0.3 0.3  Alkaline Phos 38 - 126 U/L 65 71 72  AST 15 - 41 U/L 11(L) 11(L) 12(L)  ALT 0 - 44 U/L 14 10 12     Lab Results  Component Value Date   WBC 9.4 03/03/2019   HGB 11.8 (L) 03/03/2019   HCT 35.5 (L) 03/03/2019   MCV 92.7 03/03/2019   PLT 331 03/03/2019   NEUTROABS 6.4 03/03/2019    ASSESSMENT & PLAN:  Malignant neoplasm of upper-outer quadrant of right breast in female, estrogen receptor positive ([place])  05/01/18:Bilateral mastectomies: Left mastectomy: DCIS intermediate grade 0.9 cm margins negative Tis NX stage 0; right mastectomy: ILC, grade 3, 3.5 cm, with LCIS, perineural invasion present, margins negative, 15/22 lymph nodes positive with extracapsular extension, ER 90%, PR 0%, HER-2 +(IHC 3+), Ki-67 5%, T2N3M1Stage IV  06/03/2018 bone scan: Increased tracer uptake in the lumbar spine at L1, L3 and L4, right fourth and seventh ribs, inferior right scapula and distal sternum, T7 and T11 as well.  CT CAPshowed widespread bone metastases with possible pathologic fracture at L3, peripheral right upper lobe nodules nonspecific Patient has cerebral palsy and hersisteris the power of attorney for healthcare Treatment plan: Palliative treatment with Taxolx12 cycles completed 08/26/2018,Kingenti, Perjeta ------------------------------------------------------------------------------------------------------------------------ PET CT scan:10/27/2018:Widespread bone metastatic disease demonstrates reduced with sclerosis and low metabolic activity with SUV of 3-4. Left level 4 lymph node 0.5 cm and left deep parotid lymph node SUV 4.1 will need follow-up. Uterine leiomyomas.  Current treatment:Kingenti maintenance along with anastrozole  Anastrozole toxicities: Denies any hot flashes or myalgias. Kingenti toxicities: None Intermittent diarrhea  Return to clinic in 2 months with PET CT scan and follow-up      Orders Placed This Encounter  Procedures  . NM PET Image Restag (PS) Skull Base To Thigh    Standing Status:   Future    Standing Expiration Date:   03/02/2020    Order Specific Question:   ** REASON FOR EXAM (FREE TEXT)    Answer:   Met Breast cancer restaging    Order Specific Question:   If indicated for the ordered procedure, I authorize the administration of a radiopharmaceutical per Radiology protocol    Answer:   Yes    Order Specific Question:   Is the patient pregnant?     Answer:   No    Order Specific Question:   Preferred imaging location?    Answer:   [facility]    Order Specific Question:   Radiology Contrast Protocol - do NOT remove file path    Answer:   [email or web address]   The patient has a good understanding of the overall plan. she agrees with it. she will call with any problems that may develop before the next visit here.  Nicholas Lose, MD 03/03/2019  Julious Oka Dorshimer am acting as scribe for Dr. Nicholas Lose.  I have reviewed the above documentation for accuracy and completeness, and I agree with the above.

## 2019-03-03 ENCOUNTER — Inpatient Hospital Stay: Payer: Medicare Other

## 2019-03-03 ENCOUNTER — Encounter: Payer: Self-pay | Admitting: *Deleted

## 2019-03-03 ENCOUNTER — Ambulatory Visit: Payer: Medicare Other

## 2019-03-03 ENCOUNTER — Inpatient Hospital Stay: Payer: Medicare Other | Attending: Hematology and Oncology

## 2019-03-03 ENCOUNTER — Other Ambulatory Visit: Payer: Self-pay

## 2019-03-03 ENCOUNTER — Inpatient Hospital Stay (HOSPITAL_BASED_OUTPATIENT_CLINIC_OR_DEPARTMENT_OTHER): Payer: Medicare Other | Admitting: Hematology and Oncology

## 2019-03-03 VITALS — BP 147/88 | HR 86 | Temp 98.6°F | Resp 20

## 2019-03-03 VITALS — BP 154/100 | HR 92 | Temp 98.3°F | Resp 18 | Ht 62.0 in | Wt 125.1 lb

## 2019-03-03 DIAGNOSIS — C7951 Secondary malignant neoplasm of bone: Secondary | ICD-10-CM | POA: Insufficient documentation

## 2019-03-03 DIAGNOSIS — C50411 Malignant neoplasm of upper-outer quadrant of right female breast: Secondary | ICD-10-CM | POA: Insufficient documentation

## 2019-03-03 DIAGNOSIS — Z5112 Encounter for antineoplastic immunotherapy: Secondary | ICD-10-CM | POA: Insufficient documentation

## 2019-03-03 DIAGNOSIS — Z79899 Other long term (current) drug therapy: Secondary | ICD-10-CM | POA: Diagnosis not present

## 2019-03-03 DIAGNOSIS — Z170 Estrogen receptor positive status [ER+]: Secondary | ICD-10-CM | POA: Insufficient documentation

## 2019-03-03 DIAGNOSIS — Z9013 Acquired absence of bilateral breasts and nipples: Secondary | ICD-10-CM | POA: Diagnosis not present

## 2019-03-03 DIAGNOSIS — Z95828 Presence of other vascular implants and grafts: Secondary | ICD-10-CM

## 2019-03-03 DIAGNOSIS — Z791 Long term (current) use of non-steroidal anti-inflammatories (NSAID): Secondary | ICD-10-CM | POA: Insufficient documentation

## 2019-03-03 DIAGNOSIS — Z7189 Other specified counseling: Secondary | ICD-10-CM

## 2019-03-03 DIAGNOSIS — G809 Cerebral palsy, unspecified: Secondary | ICD-10-CM | POA: Diagnosis not present

## 2019-03-03 LAB — CBC WITH DIFFERENTIAL (CANCER CENTER ONLY)
Abs Immature Granulocytes: 0.03 10*3/uL (ref 0.00–0.07)
Basophils Absolute: 0 10*3/uL (ref 0.0–0.1)
Basophils Relative: 0 %
Eosinophils Absolute: 0.1 10*3/uL (ref 0.0–0.5)
Eosinophils Relative: 1 %
HCT: 35.5 % — ABNORMAL LOW (ref 36.0–46.0)
Hemoglobin: 11.8 g/dL — ABNORMAL LOW (ref 12.0–15.0)
Immature Granulocytes: 0 %
Lymphocytes Relative: 24 %
Lymphs Abs: 2.2 10*3/uL (ref 0.7–4.0)
MCH: 30.8 pg (ref 26.0–34.0)
MCHC: 33.2 g/dL (ref 30.0–36.0)
MCV: 92.7 fL (ref 80.0–100.0)
Monocytes Absolute: 0.5 10*3/uL (ref 0.1–1.0)
Monocytes Relative: 6 %
Neutro Abs: 6.4 10*3/uL (ref 1.7–7.7)
Neutrophils Relative %: 69 %
Platelet Count: 331 10*3/uL (ref 150–400)
RBC: 3.83 MIL/uL — ABNORMAL LOW (ref 3.87–5.11)
RDW: 13.8 % (ref 11.5–15.5)
WBC Count: 9.4 10*3/uL (ref 4.0–10.5)
nRBC: 0 % (ref 0.0–0.2)

## 2019-03-03 LAB — CMP (CANCER CENTER ONLY)
ALT: 14 U/L (ref 0–44)
AST: 11 U/L — ABNORMAL LOW (ref 15–41)
Albumin: 3.6 g/dL (ref 3.5–5.0)
Alkaline Phosphatase: 65 U/L (ref 38–126)
Anion gap: 11 (ref 5–15)
BUN: 12 mg/dL (ref 6–20)
CO2: 23 mmol/L (ref 22–32)
Calcium: 8.8 mg/dL — ABNORMAL LOW (ref 8.9–10.3)
Chloride: 109 mmol/L (ref 98–111)
Creatinine: 0.63 mg/dL (ref 0.44–1.00)
GFR, Est AFR Am: >60 mL/min (ref >60)
GFR, Estimated: >60 mL/min (ref >60)
Glucose, Bld: 94 mg/dL (ref 70–99)
Potassium: 3.6 mmol/L (ref 3.5–5.1)
Sodium: 143 mmol/L (ref 135–145)
Total Bilirubin: 0.4 mg/dL (ref 0.3–1.2)
Total Protein: 7.2 g/dL (ref 6.5–8.1)

## 2019-03-03 MED ORDER — ACETAMINOPHEN 325 MG PO TABS
ORAL_TABLET | ORAL | Status: AC
  Filled 2019-03-03: qty 2

## 2019-03-03 MED ORDER — HEPARIN SOD (PORK) LOCK FLUSH 100 UNIT/ML IV SOLN
500.0000 [IU] | Freq: Once | INTRAVENOUS | Status: AC | PRN
  Administered 2019-03-03: 500 [IU]
  Filled 2019-03-03: qty 5

## 2019-03-03 MED ORDER — SODIUM CHLORIDE 0.9 % IV SOLN
Freq: Once | INTRAVENOUS | Status: AC
  Administered 2019-03-03: 12:00:00 via INTRAVENOUS
  Filled 2019-03-03: qty 250

## 2019-03-03 MED ORDER — TRASTUZUMAB CHEMO 150 MG IV SOLR
6.0000 mg/kg | Freq: Once | INTRAVENOUS | Status: AC
  Administered 2019-03-03: 336 mg via INTRAVENOUS
  Filled 2019-03-03: qty 16

## 2019-03-03 MED ORDER — DIPHENHYDRAMINE HCL 25 MG PO CAPS
50.0000 mg | ORAL_CAPSULE | Freq: Once | ORAL | Status: AC
  Administered 2019-03-03: 50 mg via ORAL

## 2019-03-03 MED ORDER — SODIUM CHLORIDE 0.9 % IV SOLN
420.0000 mg | Freq: Once | INTRAVENOUS | Status: AC
  Administered 2019-03-03: 420 mg via INTRAVENOUS
  Filled 2019-03-03: qty 14

## 2019-03-03 MED ORDER — SODIUM CHLORIDE 0.9% FLUSH
10.0000 mL | INTRAVENOUS | Status: DC | PRN
  Administered 2019-03-03: 10 mL
  Filled 2019-03-03: qty 10

## 2019-03-03 MED ORDER — ACETAMINOPHEN 325 MG PO TABS
650.0000 mg | ORAL_TABLET | Freq: Once | ORAL | Status: AC
  Administered 2019-03-03: 650 mg via ORAL

## 2019-03-03 MED ORDER — DIPHENHYDRAMINE HCL 25 MG PO CAPS
ORAL_CAPSULE | ORAL | Status: AC
  Filled 2019-03-03: qty 2

## 2019-03-03 MED ORDER — TRASTUZUMAB-ANNS CHEMO 150 MG IV SOLR: 6.0000 mg/kg | Freq: Once | INTRAVENOUS | Status: DC

## 2019-03-03 NOTE — Assessment & Plan Note (Signed)
05/01/18:Bilateral mastectomies: Left mastectomy: DCIS intermediate grade 0.9 cm margins negative Tis NX stage 0; right mastectomy: ILC, grade 3, 3.5 cm, with LCIS, perineural invasion present, margins negative, 15/22 lymph nodes positive with extracapsular extension, ER 90%, PR 0%, HER-2 +(IHC 3+), Ki-67 5%, T2N3M1Stage IV  06/03/2018 bone scan: Increased tracer uptake in the lumbar spine at L1, L3 and L4, right fourth and seventh ribs, inferior right scapula and distal sternum, T7 and T11 as well.  CT CAPshowed widespread bone metastases with possible pathologic fracture at L3, peripheral right upper lobe nodules nonspecific Patient has cerebral palsy and hersisteris the power of attorney for healthcare Treatment plan: Palliative treatment with Taxolx12 cycles completed 08/26/2018,Kingenti, Perjeta ------------------------------------------------------------------------------------------------------------------------ PET CT scan:10/27/2018:Widespread bone metastatic disease demonstrates reduced with sclerosis and low metabolic activity with SUV of 3-4. Left level 4 lymph node 0.5 cm and left deep parotid lymph node SUV 4.1 will need follow-up. Uterine leiomyomas.  Current treatment:Kingenti maintenance along with anastrozole  Anastrozole toxicities: Denies any hot flashes or myalgias. Kingenti toxicities: None  Return to clinic every 4 weeks for Kingenti and every8weeks for follow-up with me  PET CT scan at the end of the year. 

## 2019-03-03 NOTE — Patient Instructions (Signed)

## 2019-03-03 NOTE — Patient Instructions (Signed)
[facility] Discharge Instructions for Patients Receiving Chemotherapy  Today you received the following chemotherapy agents Herceptin and Perjeta.   To help prevent nausea and vomiting after your treatment, we encourage you to take your nausea medication as directed.   If you develop nausea and vomiting that is not controlled by your nausea medication, call the clinic.   BELOW ARE SYMPTOMS THAT SHOULD BE REPORTED IMMEDIATELY:  *FEVER GREATER THAN 100.5 F  *CHILLS WITH OR WITHOUT FEVER  NAUSEA AND VOMITING THAT IS NOT CONTROLLED WITH YOUR NAUSEA MEDICATION  *UNUSUAL SHORTNESS OF BREATH  *UNUSUAL BRUISING OR BLEEDING  TENDERNESS IN MOUTH AND THROAT WITH OR WITHOUT PRESENCE OF ULCERS  *URINARY PROBLEMS  *BOWEL PROBLEMS  UNUSUAL RASH Items with * indicate a potential emergency and should be followed up as soon as possible.  Feel free to call the clinic should you have any questions or concerns. The clinic phone number is [phone].  Please show the CHEMO ALERT CARD at check-in to the Emergency Department and triage nurse.   

## 2019-03-07 ENCOUNTER — Other Ambulatory Visit: Payer: Self-pay | Admitting: Hematology and Oncology

## 2019-03-31 ENCOUNTER — Inpatient Hospital Stay: Payer: Medicare Other

## 2019-03-31 ENCOUNTER — Inpatient Hospital Stay: Payer: Medicare Other | Attending: Hematology and Oncology

## 2019-03-31 ENCOUNTER — Ambulatory Visit: Payer: Medicare Other | Admitting: Adult Health

## 2019-03-31 ENCOUNTER — Ambulatory Visit: Payer: Medicare Other

## 2019-03-31 ENCOUNTER — Other Ambulatory Visit: Payer: Self-pay | Admitting: Hematology and Oncology

## 2019-03-31 ENCOUNTER — Ambulatory Visit: Payer: Medicare Other | Admitting: Hematology and Oncology

## 2019-03-31 ENCOUNTER — Other Ambulatory Visit: Payer: Self-pay

## 2019-03-31 VITALS — BP 140/60 | HR 95 | Temp 98.5°F | Resp 18

## 2019-03-31 DIAGNOSIS — C7951 Secondary malignant neoplasm of bone: Secondary | ICD-10-CM | POA: Insufficient documentation

## 2019-03-31 DIAGNOSIS — Z9013 Acquired absence of bilateral breasts and nipples: Secondary | ICD-10-CM | POA: Insufficient documentation

## 2019-03-31 DIAGNOSIS — Z5112 Encounter for antineoplastic immunotherapy: Secondary | ICD-10-CM | POA: Insufficient documentation

## 2019-03-31 DIAGNOSIS — C50411 Malignant neoplasm of upper-outer quadrant of right female breast: Secondary | ICD-10-CM | POA: Diagnosis not present

## 2019-03-31 DIAGNOSIS — Z170 Estrogen receptor positive status [ER+]: Secondary | ICD-10-CM | POA: Diagnosis not present

## 2019-03-31 DIAGNOSIS — Z95828 Presence of other vascular implants and grafts: Secondary | ICD-10-CM

## 2019-03-31 DIAGNOSIS — Z7189 Other specified counseling: Secondary | ICD-10-CM

## 2019-03-31 LAB — CMP (CANCER CENTER ONLY)
ALT: 12 U/L (ref 0–44)
AST: 10 U/L — ABNORMAL LOW (ref 15–41)
Albumin: 3.7 g/dL (ref 3.5–5.0)
Alkaline Phosphatase: 61 U/L (ref 38–126)
Anion gap: 10 (ref 5–15)
BUN: 14 mg/dL (ref 6–20)
CO2: 23 mmol/L (ref 22–32)
Calcium: 8.8 mg/dL — ABNORMAL LOW (ref 8.9–10.3)
Chloride: 108 mmol/L (ref 98–111)
Creatinine: 0.72 mg/dL (ref 0.44–1.00)
GFR, Est AFR Am: >60 mL/min (ref >60)
GFR, Estimated: >60 mL/min (ref >60)
Glucose, Bld: 118 mg/dL — ABNORMAL HIGH (ref 70–99)
Potassium: 4 mmol/L (ref 3.5–5.1)
Sodium: 141 mmol/L (ref 135–145)
Total Bilirubin: 0.3 mg/dL (ref 0.3–1.2)
Total Protein: 7.4 g/dL (ref 6.5–8.1)

## 2019-03-31 LAB — CBC WITH DIFFERENTIAL (CANCER CENTER ONLY)
Abs Immature Granulocytes: 0.03 10*3/uL (ref 0.00–0.07)
Basophils Absolute: 0.1 10*3/uL (ref 0.0–0.1)
Basophils Relative: 1 %
Eosinophils Absolute: 0.1 10*3/uL (ref 0.0–0.5)
Eosinophils Relative: 2 %
HCT: 36.6 % (ref 36.0–46.0)
Hemoglobin: 11.8 g/dL — ABNORMAL LOW (ref 12.0–15.0)
Immature Granulocytes: 0 %
Lymphocytes Relative: 30 %
Lymphs Abs: 2.3 10*3/uL (ref 0.7–4.0)
MCH: 29.8 pg (ref 26.0–34.0)
MCHC: 32.2 g/dL (ref 30.0–36.0)
MCV: 92.4 fL (ref 80.0–100.0)
Monocytes Absolute: 0.5 10*3/uL (ref 0.1–1.0)
Monocytes Relative: 6 %
Neutro Abs: 4.6 10*3/uL (ref 1.7–7.7)
Neutrophils Relative %: 61 %
Platelet Count: 311 10*3/uL (ref 150–400)
RBC: 3.96 MIL/uL (ref 3.87–5.11)
RDW: 13.9 % (ref 11.5–15.5)
WBC Count: 7.6 10*3/uL (ref 4.0–10.5)
nRBC: 0 % (ref 0.0–0.2)

## 2019-03-31 MED ORDER — HEPARIN SOD (PORK) LOCK FLUSH 100 UNIT/ML IV SOLN
500.0000 [IU] | Freq: Once | INTRAVENOUS | Status: AC | PRN
  Administered 2019-03-31: 500 [IU]
  Filled 2019-03-31: qty 5

## 2019-03-31 MED ORDER — SODIUM CHLORIDE 0.9 % IV SOLN
420.0000 mg | Freq: Once | INTRAVENOUS | Status: AC
  Administered 2019-03-31: 420 mg via INTRAVENOUS
  Filled 2019-03-31: qty 14

## 2019-03-31 MED ORDER — SODIUM CHLORIDE 0.9% FLUSH
10.0000 mL | INTRAVENOUS | Status: DC | PRN
  Administered 2019-03-31: 10 mL
  Filled 2019-03-31: qty 10

## 2019-03-31 MED ORDER — ACETAMINOPHEN 325 MG PO TABS
ORAL_TABLET | ORAL | Status: AC
  Filled 2019-03-31: qty 2

## 2019-03-31 MED ORDER — TRASTUZUMAB-ANNS CHEMO 150 MG IV SOLR
6.0000 mg/kg | Freq: Once | INTRAVENOUS | Status: AC
  Administered 2019-03-31: 336 mg via INTRAVENOUS
  Filled 2019-03-31: qty 16

## 2019-03-31 MED ORDER — SODIUM CHLORIDE 0.9 % IV SOLN
Freq: Once | INTRAVENOUS | Status: AC
  Administered 2019-03-31: 09:00:00 via INTRAVENOUS
  Filled 2019-03-31: qty 250

## 2019-03-31 MED ORDER — DIPHENHYDRAMINE HCL 25 MG PO CAPS
50.0000 mg | ORAL_CAPSULE | Freq: Once | ORAL | Status: AC
  Administered 2019-03-31: 50 mg via ORAL

## 2019-03-31 MED ORDER — ACETAMINOPHEN 325 MG PO TABS
650.0000 mg | ORAL_TABLET | Freq: Once | ORAL | Status: AC
  Administered 2019-03-31: 650 mg via ORAL

## 2019-03-31 MED ORDER — DIPHENHYDRAMINE HCL 25 MG PO CAPS
ORAL_CAPSULE | ORAL | Status: AC
  Filled 2019-03-31: qty 2

## 2019-03-31 NOTE — Patient Instructions (Signed)
[facility]cer Center Discharge Instructions for Patients Receiving Chemotherapy  Today you received the following chemotherapy agents:  Trastuzumab, Pertuzumab  To help prevent nausea and vomiting after your treatment, we encourage you to take your nausea medication as prescribed.   If you develop nausea and vomiting that is not controlled by your nausea medication, call the clinic.   BELOW ARE SYMPTOMS THAT SHOULD BE REPORTED IMMEDIATELY:  *FEVER GREATER THAN 100.5 F  *CHILLS WITH OR WITHOUT FEVER  NAUSEA AND VOMITING THAT IS NOT CONTROLLED WITH YOUR NAUSEA MEDICATION  *UNUSUAL SHORTNESS OF BREATH  *UNUSUAL BRUISING OR BLEEDING  TENDERNESS IN MOUTH AND THROAT WITH OR WITHOUT PRESENCE OF ULCERS  *URINARY PROBLEMS  *BOWEL PROBLEMS  UNUSUAL RASH Items with * indicate a potential emergency and should be followed up as soon as possible.  Feel free to call the clinic should you have any questions or concerns. The clinic phone number is [phone].  Please show the CHEMO ALERT CARD at check-in to the Emergency Department and triage nurse.   

## 2019-04-01 ENCOUNTER — Other Ambulatory Visit: Payer: Self-pay | Admitting: Hematology and Oncology

## 2019-04-12 ENCOUNTER — Telehealth: Payer: Self-pay | Admitting: Family Medicine

## 2019-04-12 NOTE — Chronic Care Management (AMB) (Signed)
°  Chronic Care Management   Outreach Note  04/12/2019 Name: [name] MRN: [id number] DOB: 08/18/1965  Referred by: Janora Norlander, DO Reason for referral : Chronic Care Management (Initial CCM outreach )   A telephone outreach was attempted today. Per patients sister [name] would like for me to call her back in 14 days.  The patient was referred to the case management team by for assistance with care management and care coordination.   Follow Up Plan: The care management team will reach out to the patient again over the next 14 days.   [place], [place] [id number] Direct Dial: [email or web address]  Website: [email or web address]

## 2019-04-26 ENCOUNTER — Other Ambulatory Visit: Payer: Self-pay

## 2019-04-26 ENCOUNTER — Ambulatory Visit (HOSPITAL_COMMUNITY)
Admission: RE | Admit: 2019-04-26 | Discharge: 2019-04-26 | Disposition: A | Discharge status: Home / Self Care | Payer: Medicare Other | Source: Ambulatory Visit | Attending: Hematology and Oncology | Admitting: Hematology and Oncology

## 2019-04-26 DIAGNOSIS — N852 Hypertrophy of uterus: Secondary | ICD-10-CM | POA: Insufficient documentation

## 2019-04-26 DIAGNOSIS — C50411 Malignant neoplasm of upper-outer quadrant of right female breast: Secondary | ICD-10-CM | POA: Insufficient documentation

## 2019-04-26 DIAGNOSIS — Z170 Estrogen receptor positive status [ER+]: Secondary | ICD-10-CM | POA: Diagnosis not present

## 2019-04-26 DIAGNOSIS — C7951 Secondary malignant neoplasm of bone: Secondary | ICD-10-CM | POA: Diagnosis not present

## 2019-04-26 DIAGNOSIS — Z79899 Other long term (current) drug therapy: Secondary | ICD-10-CM | POA: Insufficient documentation

## 2019-04-26 DIAGNOSIS — C50919 Malignant neoplasm of unspecified site of unspecified female breast: Secondary | ICD-10-CM | POA: Diagnosis not present

## 2019-04-26 LAB — GLUCOSE, CAPILLARY: Glucose-Capillary: 122 mg/dL — ABNORMAL HIGH (ref 70–99)

## 2019-04-26 MED ORDER — FLUDEOXYGLUCOSE F - 18 (FDG) INJECTION: 6.2000 | Freq: Once | INTRAVENOUS | Status: DC | PRN

## 2019-04-27 ENCOUNTER — Other Ambulatory Visit: Payer: Self-pay | Admitting: Hematology and Oncology

## 2019-04-27 NOTE — Progress Notes (Signed)
Patient Care Team: Janora Norlander, DO as PCP - General (Family Medicine) Danie Binder, MD (Gastroenterology) Alphonsa Overall, MD as Consulting Physician (General Surgery) Nicholas Lose, MD as Consulting Physician (Hematology and Oncology) Kyung Rudd, MD as Consulting Physician (Radiation Oncology) Mauro Kaufmann, RN as Oncology Nurse Navigator Rockwell Germany, RN as Oncology Nurse Navigator  DIAGNOSIS:    ICD-10-CM   1. 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 Malignant neoplasm of upper-outer quadrant of right breast in female, estrogen receptor positive ([place])  C50.411    Z17.0     SUMMARY OF ONCOLOGIC HISTORY: Oncology History  Malignant neoplasm of upper-outer quadrant of right breast in female, estrogen receptor positive ([place])  03/18/2018 Initial Diagnosis   Screening detected bilateral breast abnormalities.  Right breast mass UOQ 2.1 cm at 10 o'clock position: Grade 1 ILC ER 70%, PR 0%, HER-2 +3+ by IHC, Ki-67 5%, suspected lymph node in the axilla could not be biopsied; left breast calcifications by ultrasound not visible but 5 cm cystic/tubular structure which on biopsy was fibrocystic change with ADHl; T2N0 stage IIa clinical stage   05/01/2018 Surgery   Bilateral mastectomies: Left mastectomy: DCIS intermediate grade 0.9 cm margins negative Tis NX stage 0; right mastectomy: ILC, grade 3, 3.5 cm, with LCIS, perineural invasion present, margins negative, 15/22 lymph nodes positive with extracapsular extension, ER 90%, PR 0%, HER-2 +(IHC 3+), Ki-67 5%, T2N3 Stage 3B    05/11/2018 Cancer Staging   Staging form: Breast, AJCC 8th Edition - Pathologic stage from 05/11/2018: Stage IIIB (pT2, pN3, cM0, G3, ER+, PR-, HER2+) - Signed by Nicholas Lose, MD on 05/11/2018   06/03/2018 Imaging   Bone scan: Increased tracer uptake in the lumbar spine at L1, L3 and L4, right fourth and seventh ribs, inferior right scapula and distal  sternum, T7 and T11 as well.   CT CAP showed widespread bone metastases with possible pathologic fracture at L3, peripheral right upper lobe nodules nonspecific   06/10/2018 -  Chemotherapy   Taxol Kingenti and Perjeta    10/27/2018 PET scan   Bone metastases less metabolically active.  Couple of lymph nodes with low-level metabolic activity noted.     CHIEF COMPLIANT: Follow-up of Kingenti maintenance andanastrozole  INTERVAL HISTORY: [name] is a 53 y.o. with above-mentioned history of metastatic breast cancer currently on treatment withmaintenance Kingenti and on anti-estrogen therapy withanastrozole. PET scan on 04/26/19 showed no significant residual hypermetabolic activity associated with known widespread osseous metastatic disease and enlargement of a left cervical lymph node. She presents to the clinic todayfor treatment and to review her scan.  REVIEW OF SYSTEMS:   Constitutional: Denies fevers, chills or abnormal weight loss Eyes: Denies blurriness of vision Ears, nose, mouth, throat, and face: Denies mucositis or sore throat Respiratory: Denies cough, dyspnea or wheezes Cardiovascular: Denies palpitation, chest discomfort Gastrointestinal: Denies nausea, heartburn or change in bowel habits Skin: Denies abnormal skin rashes Lymphatics: Denies new lymphadenopathy or easy bruising Neurological: Denies numbness, tingling or new weaknesses Behavioral/Psych: Mood is stable, no new changes  Extremities: No lower extremity edema Breast: denies any pain or lumps or nodules in either breasts All other systems were reviewed with the patient and are negative.  I have reviewed the past medical history, past surgical history, social history and family history with the patient and they are unchanged from previous note.  ALLERGIES:  is allergic to hydrocodone.  MEDICATIONS:  Current Outpatient Medications  Medication Sig Dispense Refill  . diphenoxylate-atropine (LOMOTIL)  2.5-0.025 MG tablet TAKE 1 TO 2 TABLETS BY MOUTH 4 TIMES DAILY AS NEEDED DIARRHEA 40 tablet 2  . ibuprofen (ADVIL,MOTRIN) 600 MG tablet Take 1 tablet (600 mg total) by mouth every 6 (six) hours as needed (for mild pain not relieved by other medications.). 30 tablet 0  . lisinopril (ZESTRIL) 40 MG tablet Take 1 tablet (40 mg total) by mouth daily. 90 tablet 3  . megestrol (MEGACE) 40 MG tablet TAKE 3 TABLETS EVERY DAY 270 tablet 11  . megestrol (MEGACE) 40 MG tablet Take 1 tablet (40 mg total) by mouth 2 (two) times daily. Take 3 tablets every day 180 tablet 3  . ondansetron (ZOFRAN-ODT) 4 MG disintegrating tablet Take 1 tablet (4 mg total) by mouth every 6 (six) hours as needed for nausea. 15 tablet 0  . pantoprazole (PROTONIX) 40 MG tablet TAKE 1 TABLET BY MOUTH 30 MINUTES PRIOR TO BREAKFAST AND SUPPER 180 tablet 3  . polyethylene glycol (MIRALAX / GLYCOLAX) packet Take 17 g by mouth daily as needed for moderate constipation.    . potassium chloride (KLOR-CON) 10 MEQ tablet TAKE 1 TABLET BY MOUTH EVERY DAY 30 tablet 1  . pravastatin (PRAVACHOL) 40 MG tablet Take 1 tablet (40 mg total) by mouth daily. (Needs to be seen before next refill) 90 tablet 3  . tamoxifen (NOLVADEX) 20 MG tablet Take 1 tablet (20 mg total) by mouth daily. 90 tablet 3   No current facility-administered medications for this visit.   Facility-Administered Medications Ordered in Other Visits  Medication Dose Route Frequency Provider Last Rate Last Admin  . fludeoxyglucose F - 18 (FDG) injection 6.2 millicurie  6.2 millicurie Intravenous Once PRN Gaspar Cola, MD      . heparin lock flush 100 unit/mL  500 Units Intracatheter Once PRN Nicholas Lose, MD      . pertuzumab (PERJETA) 420 mg in sodium chloride 0.9 % 250 mL chemo infusion  420 mg Intravenous Once Nicholas Lose, MD      . sodium chloride flush (NS) 0.9 % injection 10 mL  10 mL Intracatheter PRN Nicholas Lose, MD      . Theotis Burrow [facility]) 336 mg in sodium  chloride 0.9 % 250 mL chemo infusion  6 mg/kg (Treatment Plan Recorded) Intravenous Once Nicholas Lose, MD        PHYSICAL EXAMINATION: ECOG PERFORMANCE STATUS: 1 - Symptomatic but completely ambulatory  Vitals:   04/28/19 0929  BP: (!) 138/110  Pulse: 86  Resp: 17  Temp: 98 F (36.7 C)  SpO2: 100%   Filed Weights   04/28/19 0929  Weight: 129 lb 11.2 oz (58.8 kg)    GENERAL: alert, no distress and comfortable SKIN: skin color, texture, turgor are normal, no rashes or significant lesions EYES: normal, Conjunctiva are pink and non-injected, sclera clear OROPHARYNX: no exudate, no erythema and lips, buccal mucosa, and tongue normal  NECK: supple, thyroid normal size, non-tender, without nodularity LYMPH: no palpable lymphadenopathy in the cervical, axillary or inguinal LUNGS: clear to auscultation and percussion with normal breathing effort HEART: regular rate & rhythm and no murmurs and no lower extremity edema ABDOMEN: abdomen soft, non-tender and normal bowel sounds MUSCULOSKELETAL: no cyanosis of digits and no clubbing  NEURO: alert & oriented x 3 with fluent speech, no focal motor/sensory deficits EXTREMITIES: No lower extremity edema  LABORATORY DATA:  I have reviewed the data as listed CMP Latest Ref Rng & Units 04/28/2019 03/31/2019 03/03/2019  Glucose 70 - 99 mg/dL 106(H) 118(H) 94  BUN 6 - 20 mg/dL _0 Creatinine 0.44 - 1.00 mg/dL 0.68 0.72 0.63  Sodium 135 - 145 mmol/L 142 141 143  Potassium 3.5 - 5.1 mmol/L 3.8 4.0 3.6  Chloride 98 - 111 mmol/L 110 108 109  CO2 22 - 32 mmol/L _1 Calcium 8.9 - 10.3 mg/dL 8.9 8.8(L) 8.8(L)  Total Protein 6.5 - 8.1 g/dL 7.4 7.4 7.2  Total Bilirubin 0.3 - 1.2 mg/dL 0.3 0.3 0.4  Alkaline Phos 38 - 126 U/L 63 61 65  AST 15 - 41 U/L 14(L) 10(L) 11(L)  ALT 0 - 44 U/L _2 Lab Results  Component Value Date   WBC 8.4 04/28/2019   HGB 11.9 (L) 04/28/2019   HCT 36.4 04/28/2019   MCV 93.6 04/28/2019   PLT 335  04/28/2019   NEUTROABS 5.4 04/28/2019    ASSESSMENT & PLAN:  Malignant neoplasm of upper-outer quadrant of right breast in female, estrogen receptor positive ([place]) 05/01/18:Bilateral mastectomies: Left mastectomy: DCIS intermediate grade 0.9 cm margins negative Tis NX stage 0; right mastectomy: ILC, grade 3, 3.5 cm, with LCIS, perineural invasion present, margins negative, 15/22 lymph nodes positive with extracapsular extension, ER 90%, PR 0%, HER-2 +(IHC 3+), Ki-67 5%, T2N3M1Stage IV  06/03/2018 bone scan: Increased tracer uptake in the lumbar spine at L1, L3 and L4, right fourth and seventh ribs, inferior right scapula and distal sternum, T7 and T11 as well.  CT CAPshowed widespread bone metastases with possible pathologic fracture at L3, peripheral right upper lobe nodules nonspecific Patient has cerebral palsy and hersisteris the power of attorney for healthcare Treatment plan: Palliative treatment with Taxolx12 cycles completed 08/26/2018,Kingenti, Perjeta ------------------------------------------------------------------------------------------------------------------------ PET CT scan:10/27/2018:Widespread bone metastatic disease demonstrates reduced with sclerosis and low metabolic activity with SUV of 3-4. Left level 4 lymph node 0.5 cm and left deep parotid lymph node SUV 4.1 will need follow-up. Uterine leiomyomas.  Current treatment:Kingenti maintenance along with anastrozole  Anastrozole toxicities: Denies any hot flashes or myalgias. Kingenti toxicities: None Intermittent diarrhea  PET CT scan 04/26/2019: No significant residual hypermetabolic activity associated with the bone metastases. Interval enlargement and increased metabolic activity in the level 4 left cervical lymph node measuring 8 mm was previously 5 mm with SUV 5.9.  Recommendation: ENT referral Continue with current management of metastatic breast cancer with Kingenti and anastrozole  Return to  clinic every 4 weeks for Kingenti and every 12 weeks for follow-up with me with labs    Orders Placed This Encounter  Procedures  . Ambulatory referral to ENT    Referral Priority:   Routine    Referral Type:   Consultation    Referral Reason:   Specialty Services Required    Referred to Provider:   Rozetta Nunnery, MD    Requested Specialty:   Otolaryngology    Number of Visits Requested:   1   The patient has a good understanding of the overall plan. she agrees with it. she will call with any problems that may develop before the next visit here.  Nicholas Lose, MD 04/28/2019  Julious Oka Dorshimer, am acting as scribe for Dr. Nicholas Lose.  I have reviewed the above document for accuracy and completeness, and I agree with the above.

## 2019-04-28 ENCOUNTER — Inpatient Hospital Stay: Payer: Medicare Other

## 2019-04-28 ENCOUNTER — Encounter: Payer: Self-pay | Admitting: *Deleted

## 2019-04-28 ENCOUNTER — Inpatient Hospital Stay (HOSPITAL_BASED_OUTPATIENT_CLINIC_OR_DEPARTMENT_OTHER): Payer: Medicare Other | Admitting: Hematology and Oncology

## 2019-04-28 ENCOUNTER — Inpatient Hospital Stay: Payer: Medicare Other | Attending: Hematology and Oncology

## 2019-04-28 ENCOUNTER — Other Ambulatory Visit: Payer: Self-pay

## 2019-04-28 VITALS — BP 138/110 | HR 86 | Temp 98.0°F | Resp 17 | Ht 62.0 in | Wt 129.7 lb

## 2019-04-28 VITALS — BP 142/71

## 2019-04-28 DIAGNOSIS — C770 Secondary and unspecified malignant neoplasm of lymph nodes of head, face and neck: Secondary | ICD-10-CM | POA: Insufficient documentation

## 2019-04-28 DIAGNOSIS — Z170 Estrogen receptor positive status [ER+]: Secondary | ICD-10-CM

## 2019-04-28 DIAGNOSIS — G809 Cerebral palsy, unspecified: Secondary | ICD-10-CM | POA: Insufficient documentation

## 2019-04-28 DIAGNOSIS — Z791 Long term (current) use of non-steroidal anti-inflammatories (NSAID): Secondary | ICD-10-CM | POA: Diagnosis not present

## 2019-04-28 DIAGNOSIS — Z9221 Personal history of antineoplastic chemotherapy: Secondary | ICD-10-CM | POA: Insufficient documentation

## 2019-04-28 DIAGNOSIS — C50411 Malignant neoplasm of upper-outer quadrant of right female breast: Secondary | ICD-10-CM | POA: Diagnosis not present

## 2019-04-28 DIAGNOSIS — Z9013 Acquired absence of bilateral breasts and nipples: Secondary | ICD-10-CM | POA: Diagnosis not present

## 2019-04-28 DIAGNOSIS — Z79899 Other long term (current) drug therapy: Secondary | ICD-10-CM | POA: Insufficient documentation

## 2019-04-28 DIAGNOSIS — C7951 Secondary malignant neoplasm of bone: Secondary | ICD-10-CM | POA: Insufficient documentation

## 2019-04-28 DIAGNOSIS — Z95828 Presence of other vascular implants and grafts: Secondary | ICD-10-CM

## 2019-04-28 DIAGNOSIS — Z7189 Other specified counseling: Secondary | ICD-10-CM

## 2019-04-28 DIAGNOSIS — Z5112 Encounter for antineoplastic immunotherapy: Secondary | ICD-10-CM | POA: Diagnosis not present

## 2019-04-28 LAB — CBC WITH DIFFERENTIAL (CANCER CENTER ONLY)
Abs Immature Granulocytes: 0.02 10*3/uL (ref 0.00–0.07)
Basophils Absolute: 0.1 10*3/uL (ref 0.0–0.1)
Basophils Relative: 1 %
Eosinophils Absolute: 0.1 10*3/uL (ref 0.0–0.5)
Eosinophils Relative: 1 %
HCT: 36.4 % (ref 36.0–46.0)
Hemoglobin: 11.9 g/dL — ABNORMAL LOW (ref 12.0–15.0)
Immature Granulocytes: 0 %
Lymphocytes Relative: 28 %
Lymphs Abs: 2.4 10*3/uL (ref 0.7–4.0)
MCH: 30.6 pg (ref 26.0–34.0)
MCHC: 32.7 g/dL (ref 30.0–36.0)
MCV: 93.6 fL (ref 80.0–100.0)
Monocytes Absolute: 0.5 10*3/uL (ref 0.1–1.0)
Monocytes Relative: 6 %
Neutro Abs: 5.4 10*3/uL (ref 1.7–7.7)
Neutrophils Relative %: 64 %
Platelet Count: 335 10*3/uL (ref 150–400)
RBC: 3.89 MIL/uL (ref 3.87–5.11)
RDW: 13.4 % (ref 11.5–15.5)
WBC Count: 8.4 10*3/uL (ref 4.0–10.5)
nRBC: 0 % (ref 0.0–0.2)

## 2019-04-28 LAB — CMP (CANCER CENTER ONLY)
ALT: 13 U/L (ref 0–44)
AST: 14 U/L — ABNORMAL LOW (ref 15–41)
Albumin: 3.8 g/dL (ref 3.5–5.0)
Alkaline Phosphatase: 63 U/L (ref 38–126)
Anion gap: 10 (ref 5–15)
BUN: 17 mg/dL (ref 6–20)
CO2: 22 mmol/L (ref 22–32)
Calcium: 8.9 mg/dL (ref 8.9–10.3)
Chloride: 110 mmol/L (ref 98–111)
Creatinine: 0.68 mg/dL (ref 0.44–1.00)
GFR, Est AFR Am: >60 mL/min (ref >60)
GFR, Estimated: >60 mL/min (ref >60)
Glucose, Bld: 106 mg/dL — ABNORMAL HIGH (ref 70–99)
Potassium: 3.8 mmol/L (ref 3.5–5.1)
Sodium: 142 mmol/L (ref 135–145)
Total Bilirubin: 0.3 mg/dL (ref 0.3–1.2)
Total Protein: 7.4 g/dL (ref 6.5–8.1)

## 2019-04-28 MED ORDER — DIPHENHYDRAMINE HCL 25 MG PO CAPS
50.0000 mg | ORAL_CAPSULE | Freq: Once | ORAL | Status: AC
  Administered 2019-04-28: 10:00:00 50 mg via ORAL

## 2019-04-28 MED ORDER — ACETAMINOPHEN 325 MG PO TABS
650.0000 mg | ORAL_TABLET | Freq: Once | ORAL | Status: AC
  Administered 2019-04-28: 650 mg via ORAL

## 2019-04-28 MED ORDER — SODIUM CHLORIDE 0.9 % IV SOLN
Freq: Once | INTRAVENOUS | Status: AC
  Filled 2019-04-28: qty 250

## 2019-04-28 MED ORDER — HEPARIN SOD (PORK) LOCK FLUSH 100 UNIT/ML IV SOLN
500.0000 [IU] | Freq: Once | INTRAVENOUS | Status: AC | PRN
  Administered 2019-04-28: 500 [IU]
  Filled 2019-04-28: qty 5

## 2019-04-28 MED ORDER — DIPHENHYDRAMINE HCL 25 MG PO CAPS
ORAL_CAPSULE | ORAL | Status: AC
  Filled 2019-04-28: qty 1

## 2019-04-28 MED ORDER — TRASTUZUMAB-ANNS CHEMO 150 MG IV SOLR
6.0000 mg/kg | Freq: Once | INTRAVENOUS | Status: AC
  Administered 2019-04-28: 11:00:00 336 mg via INTRAVENOUS
  Filled 2019-04-28: qty 16

## 2019-04-28 MED ORDER — ACETAMINOPHEN 325 MG PO TABS
ORAL_TABLET | ORAL | Status: AC
  Filled 2019-04-28: qty 2

## 2019-04-28 MED ORDER — SODIUM CHLORIDE 0.9% FLUSH
10.0000 mL | INTRAVENOUS | Status: DC | PRN
  Administered 2019-04-28: 10 mL
  Filled 2019-04-28: qty 10

## 2019-04-28 MED ORDER — SODIUM CHLORIDE 0.9% FLUSH
10.0000 mL | INTRAVENOUS | Status: DC | PRN
  Administered 2019-04-28: 09:00:00 10 mL
  Filled 2019-04-28: qty 10

## 2019-04-28 MED ORDER — SODIUM CHLORIDE 0.9 % IV SOLN
420.0000 mg | Freq: Once | INTRAVENOUS | Status: AC
  Administered 2019-04-28: 420 mg via INTRAVENOUS
  Filled 2019-04-28: qty 14

## 2019-04-28 NOTE — Patient Instructions (Signed)
[facility] Discharge Instructions for Patients Receiving Chemotherapy  Today you received the following chemotherapy agents: trastuzumab and pertuzumab.  To help prevent nausea and vomiting after your treatment, we encourage you to take your nausea medication as directed.   If you develop nausea and vomiting that is not controlled by your nausea medication, call the clinic.   BELOW ARE SYMPTOMS THAT SHOULD BE REPORTED IMMEDIATELY:  *FEVER GREATER THAN 100.5 F  *CHILLS WITH OR WITHOUT FEVER  NAUSEA AND VOMITING THAT IS NOT CONTROLLED WITH YOUR NAUSEA MEDICATION  *UNUSUAL SHORTNESS OF BREATH  *UNUSUAL BRUISING OR BLEEDING  TENDERNESS IN MOUTH AND THROAT WITH OR WITHOUT PRESENCE OF ULCERS  *URINARY PROBLEMS  *BOWEL PROBLEMS  UNUSUAL RASH Items with * indicate a potential emergency and should be followed up as soon as possible.  Feel free to call the clinic should you have any questions or concerns. The clinic phone number is [phone].  Please show the CHEMO ALERT CARD at check-in to the Emergency Department and triage nurse.   

## 2019-04-28 NOTE — Assessment & Plan Note (Signed)
05/01/18:Bilateral mastectomies: Left mastectomy: DCIS intermediate grade 0.9 cm margins negative Tis NX stage 0; right mastectomy: ILC, grade 3, 3.5 cm, with LCIS, perineural invasion present, margins negative, 15/22 lymph nodes positive with extracapsular extension, ER 90%, PR 0%, HER-2 +(IHC 3+), Ki-67 5%, T2N3M1Stage IV  06/03/2018 bone scan: Increased tracer uptake in the lumbar spine at L1, L3 and L4, right fourth and seventh ribs, inferior right scapula and distal sternum, T7 and T11 as well.  CT CAPshowed widespread bone metastases with possible pathologic fracture at L3, peripheral right upper lobe nodules nonspecific Patient has cerebral palsy and hersisteris the power of attorney for healthcare Treatment plan: Palliative treatment with Taxolx12 cycles completed 08/26/2018,Kingenti, Perjeta ------------------------------------------------------------------------------------------------------------------------ PET CT scan:10/27/2018:Widespread bone metastatic disease demonstrates reduced with sclerosis and low metabolic activity with SUV of 3-4. Left level 4 lymph node 0.5 cm and left deep parotid lymph node SUV 4.1 will need follow-up. Uterine leiomyomas.  Current treatment:Kingenti maintenance along with anastrozole  Anastrozole toxicities: Denies any hot flashes or myalgias. Kingenti toxicities: None Intermittent diarrhea  PET CT scan 04/26/2019: No significant residual hypermetabolic activity associated with the bone metastases. Interval enlargement and increased metabolic activity in the level 4 left cervical lymph node measuring 8 mm was previously 5 mm with SUV 5.9.  Recommendation: ENT referral Continue with current management of metastatic breast cancer with Kingenti and anastrozole  Return to clinic every 9 weeks for follow-up with me.

## 2019-05-04 NOTE — Chronic Care Management (AMB) (Signed)
  Chronic Care Management   Note  05/04/2019 Name: [name] MRN: [id number] DOB: [birth year]  [name] is a 53 y.o. year old female who is a primary care patient of Janora Norlander, DO. I reached out to Huey Bienenstock by phone today in response to a referral sent by [name] [employer]'s health plan.     [name] was given information about Chronic Care Management services today including:  1. CCM service includes personalized support from designated clinical staff supervised by her physician, including individualized plan of care and coordination with other care providers 2. 24/7 contact phone numbers for assistance for urgent and routine care needs. 3. Service will only be billed when office clinical staff spend 20 minutes or more in a month to coordinate care. 4. Only one practitioner may furnish and bill the service in a calendar month. 5. The patient may stop CCM services at any time (effective at the end of the month) by phone call to the office staff. 6. The patient will be responsible for cost sharing (co-pay) of up to 20% of the service fee (after annual deductible is met).  Patients sister [name] did not agree to enrollment in care management services and does not wish to consider at this time.  Follow up plan: [name] has been provided with contact information for the chronic care management team and has been advised to call with any health related questions or concerns.   [place], [place] [address] Direct Dial: [name]  Website: [email or web address]

## 2019-05-05 ENCOUNTER — Inpatient Hospital Stay: Payer: Medicare Other

## 2019-05-18 ENCOUNTER — Other Ambulatory Visit: Payer: Self-pay

## 2019-05-18 ENCOUNTER — Ambulatory Visit (INDEPENDENT_AMBULATORY_CARE_PROVIDER_SITE_OTHER): Payer: Medicare Other | Admitting: Otolaryngology

## 2019-05-18 ENCOUNTER — Encounter (INDEPENDENT_AMBULATORY_CARE_PROVIDER_SITE_OTHER): Payer: Self-pay | Admitting: Otolaryngology

## 2019-05-18 VITALS — Temp 97.5°F

## 2019-05-18 DIAGNOSIS — R590 Localized enlarged lymph nodes: Secondary | ICD-10-CM

## 2019-05-18 NOTE — Progress Notes (Signed)
HPI: [name] is a 54 y.o. female who presents is referred by Dr.Gudena for evaluation of left neck lymph node that was found on recent PET scan for follow-up of metastatic breast cancer. On review of the PET scan patient has increased activity and a small lymph node in the left lower neck level 4 measuring less than 1 cm in size. However has hypermetabolic activity. On the reading of the PET scan this suggested possible upper airway etiology.. She has had no specific throat symptoms. No sore throat.  Past Medical History:  Diagnosis Date  . Arthritis   . Cerebral palsy ([place])   . Complication of anesthesia 12/09/2013   Respiratory instability with 10 mg Propofol.Poor gag reflex.  . Constipation - functional   . Dysphagia 2003   2O to GERD/HH, ?difficulty with large food bolus due to neuromuscular weakness  . Epigastric pain MAY 2012 ABD [place] NL GBLIVPAN   2o to GERD V. NON-ULCER DYSPEPSIA  . GERD (gastroesophageal reflux disease) 2007   BPE NL ESO, REFLUX  . Hiatal hernia   . HIATAL HERNIA 11/23/2008   Qualifier: Diagnosis of  By: Zeb Comfort    . HTN (hypertension)   . Hyperlipidemia    Past Surgical History:  Procedure Laterality Date  . COLONOSCOPY  APR 2009 with proprofol   SLF: SIMPLE ADENOMAS  . MASTECTOMY W/ SENTINEL NODE BIOPSY Bilateral 05/01/2018   Procedure: BILATERAL MASTECTOMIES WITH LEFT SENTINEL LYMPH NODE BIOPSIES AND RIGHT AXILLARY LYMPH NODE DISSECTION;  Surgeon: Alphonsa Overall, MD;  Location: [facility];  Service: General;  Laterality: Bilateral;  . PORTACATH PLACEMENT N/A 05/01/2018   Procedure: INSERTION PORT-A-CATH WITH [place];  Surgeon: Alphonsa Overall, MD;  Location: [facility];  Service: General;  Laterality: N/A;  . UPPER GASTROINTESTINAL ENDOSCOPY  MAY 2012 DIL 16 mm   SLF: NO DEFINITE STRICTURE APPRECIATED  . UPPER GASTROINTESTINAL ENDOSCOPY  FEB 2009-HYPOXIA REQUIRING NARCAN, & VERSED, D50V4   SLF: NL ESO, FUNDIC GLAND POLYPS  . UPPER GASTROINTESTINAL ENDOSCOPY  w/  DIL 2003, 2004, 2006   Social History   Socioeconomic History  . Marital status: Single    Spouse name: Not on file  . Number of children: Not on file  . Years of education: Not on file  . Highest education level: Not on file  Occupational History  . Not on file  Tobacco Use  . Smoking status: Never Smoker  . Smokeless tobacco: Never Used  Substance and Sexual Activity  . Alcohol use: No  . Drug use: Yes    Types: PCP  . Sexual activity: Not Currently    Birth control/protection: None  Other Topics Concern  . Not on file  Social History Narrative  . Not on file   Social Determinants of Health   Financial Resource Strain:   . Difficulty of Paying Living Expenses: Not on file  Food Insecurity:   . Worried About Charity fundraiser in the Last Year: Not on file  . Ran Out of Food in the Last Year: Not on file  Transportation Needs:   . Lack of Transportation (Medical): Not on file  . Lack of Transportation (Non-Medical): Not on file  Physical Activity:   . Days of Exercise per Week: Not on file  . Minutes of Exercise per Session: Not on file  Stress:   . Feeling of Stress : Not on file  Social Connections:   . Frequency of Communication with Friends and Family: Not on file  . Frequency  of Social Gatherings with Friends and Family: Not on file  . Attends Religious Services: Not on file  . Active Member of Clubs or Organizations: Not on file  . Attends Archivist Meetings: Not on file  . Marital Status: Not on file   Family History  Problem Relation Age of Onset  . Colon cancer Mother        LATE 50S   Allergies  Allergen Reactions  . Hydrocodone Nausea And Vomiting and Other (See Comments)    Vomiting and hallunications   Prior to Admission medications   Medication Sig Start Date End Date Taking? Authorizing Provider  diphenoxylate-atropine (LOMOTIL) 2.5-0.025 MG tablet TAKE 1 TO 2 TABLETS BY MOUTH 4 TIMES DAILY AS NEEDED DIARRHEA 01/07/19  Yes  Nicholas Lose, MD  ibuprofen (ADVIL,MOTRIN) 600 MG tablet Take 1 tablet (600 mg total) by mouth every 6 (six) hours as needed (for mild pain not relieved by other medications.). 05/02/18  Yes Meuth, Brooke A, PA-C  lisinopril (ZESTRIL) 40 MG tablet Take 1 tablet (40 mg total) by mouth daily. 09/23/18  Yes Gottschalk, Leatrice Jewels M, DO  megestrol (MEGACE) 40 MG tablet Take 1 tablet (40 mg total) by mouth 2 (two) times daily. Take 3 tablets every day 10/07/18  Yes Nicholas Lose, MD  ondansetron (ZOFRAN-ODT) 4 MG disintegrating tablet Take 1 tablet (4 mg total) by mouth every 6 (six) hours as needed for nausea. 05/02/18  Yes Meuth, Brooke A, PA-C  pantoprazole (PROTONIX) 40 MG tablet TAKE 1 TABLET BY MOUTH 30 MINUTES PRIOR TO BREAKFAST AND SUPPER 09/23/18  Yes Gottschalk, Ashly M, DO  polyethylene glycol (MIRALAX / GLYCOLAX) packet Take 17 g by mouth daily as needed for moderate constipation.   Yes [provider]  potassium chloride (KLOR-CON) 10 MEQ tablet TAKE 1 TABLET BY MOUTH EVERY DAY 04/27/19  Yes Nicholas Lose, MD  pravastatin (PRAVACHOL) 40 MG tablet Take 1 tablet (40 mg total) by mouth daily. (Needs to be seen before next refill) 08/26/18  Yes Nicholas Lose, MD  tamoxifen (NOLVADEX) 20 MG tablet Take 1 tablet (20 mg total) by mouth daily. 08/26/18  Yes Nicholas Lose, MD  megestrol (MEGACE) 40 MG tablet TAKE 3 TABLETS EVERY DAY 10/21/18   Florian Buff, MD  prochlorperazine (COMPAZINE) 10 MG tablet Take 1 tablet (10 mg total) by mouth every 6 (six) hours as needed (Nausea or vomiting). 05/11/18 06/04/18  Nicholas Lose, MD     Positive ROS: Otherwise negative  All other systems have been reviewed and were otherwise negative with the exception of those mentioned in the HPI and as above.  Physical Exam: Constitutional: Patient has cerebral palsy but understands conversation well. Ears: External ears without lesions or tenderness. Ear canals are clear bilaterally with intact, clear TMs.  Nasal:  External nose without lesions. Septum with minimal deformity.. Clear nasal passages. No signs of infection. Oral: Lips and gums without lesions. Tongue and palate mucosa without lesions. Posterior oropharynx clear. Tonsils appear symmetric and benign bilaterally. They are soft to palpation bilaterally. No palpable masses along the base of tongue. Unable to adequately perform indirect laryngoscopy and fiberoptic laryngoscopy was performed through the right nostril. Patient had mildly prominent adenoid tissue. The base of tongue vallecula epiglottis were normal. AE folds were clear bilaterally. Vocal cords were clear with normal vocal cord mobility. Both piriform sinuses were clear. Neck: The lymph node noted on the recent PET scan is barely palpable measuring approximately 1 cm in size just lateral to the sternocleidomastoid  muscle at approximate level of the lower larynx. Respiratory: Breathing comfortably  Skin: No facial/neck lesions or rash noted.  Laryngoscopy  Date/Time: 05/18/2019 6:27 PM Performed by: Rozetta Nunnery, MD Authorized by: Rozetta Nunnery, MD   Consent:    Consent obtained:  Verbal   Consent given by:  Patient Procedure details:    Indications: difficult indirect mirror examination     Medication:  Afrin   Instrument: flexible fiberoptic laryngoscope   Sinus:    Right nasopharynx: normal     Left nasopharynx: normal   Mouth:    Oropharynx: normal     Vallecula: normal     Base of tongue: normal     Epiglottis: normal   Throat:    Pyriform sinus: normal     False vocal cords: normal     True vocal cords: normal   Comments:     Patient with clear upper airway examination on fiberoptic laryngoscopy with no identifiable abnormalities noted. She had mildly prominent adenoid tissue.    Assessment: Left neck hypermetabolic lymphadenopathy noted on recent PET scan. Questionable etiology. Normal upper airway examination in the office.  Plan: Would  recommend obtaining ultrasound-guided FNA of the left neck node if possible. We will try to schedule this.   Radene Journey, MD   CC:

## 2019-05-19 ENCOUNTER — Other Ambulatory Visit (INDEPENDENT_AMBULATORY_CARE_PROVIDER_SITE_OTHER): Payer: Self-pay

## 2019-05-19 DIAGNOSIS — R590 Localized enlarged lymph nodes: Secondary | ICD-10-CM

## 2019-05-21 ENCOUNTER — Encounter (HOSPITAL_COMMUNITY): Payer: Self-pay | Admitting: Radiology

## 2019-05-21 NOTE — Progress Notes (Signed)
Huey Bienenstock "Renette" Female, 54 y.o., 17-Aug-1965 MRN:  [id number] Phone:  [phone] Jerilynn Mages) PCP:  Janora Norlander, DO Primary Cvg:  [place] Medicare Next Appt With Oncology 05/26/2019 at 9:00 AM  RE: [place] Core Biopsy (Soft Tissue) Received: Today Message Contents  Markus Daft, MD  Arlyn Leak for US guided biopsy of left cervical lymph node.   [place]       Previous Messages   ----- Message -----  From: Garth Bigness D  Sent: 05/21/2019 11:17 AM EST  To: [facility] Procedure Requests  Subject: [place] Core Biopsy (Soft Tissue)           Procedure:  [place] CORE BIOPSY (SOFT TISSUE)   Reason: Left Cervical Lymphadenopathy   History:  CT, NM Bone, NM PET in computer   Provider: [place], [place]:  [id number]

## 2019-05-24 ENCOUNTER — Other Ambulatory Visit: Payer: Self-pay | Admitting: Hematology and Oncology

## 2019-05-26 ENCOUNTER — Other Ambulatory Visit: Payer: Self-pay

## 2019-05-26 ENCOUNTER — Inpatient Hospital Stay: Payer: Medicare Other | Attending: Hematology and Oncology

## 2019-05-26 VITALS — BP 130/82 | HR 89 | Temp 98.2°F | Resp 16 | Wt 126.0 lb

## 2019-05-26 DIAGNOSIS — Z170 Estrogen receptor positive status [ER+]: Secondary | ICD-10-CM | POA: Insufficient documentation

## 2019-05-26 DIAGNOSIS — C770 Secondary and unspecified malignant neoplasm of lymph nodes of head, face and neck: Secondary | ICD-10-CM | POA: Insufficient documentation

## 2019-05-26 DIAGNOSIS — C7951 Secondary malignant neoplasm of bone: Secondary | ICD-10-CM | POA: Diagnosis not present

## 2019-05-26 DIAGNOSIS — Z5112 Encounter for antineoplastic immunotherapy: Secondary | ICD-10-CM | POA: Insufficient documentation

## 2019-05-26 DIAGNOSIS — C50411 Malignant neoplasm of upper-outer quadrant of right female breast: Secondary | ICD-10-CM | POA: Insufficient documentation

## 2019-05-26 DIAGNOSIS — Z7189 Other specified counseling: Secondary | ICD-10-CM

## 2019-05-26 MED ORDER — HEPARIN SOD (PORK) LOCK FLUSH 100 UNIT/ML IV SOLN
500.0000 [IU] | Freq: Once | INTRAVENOUS | Status: AC | PRN
  Administered 2019-05-26: 12:00:00 500 [IU]
  Filled 2019-05-26: qty 5

## 2019-05-26 MED ORDER — SODIUM CHLORIDE 0.9 % IV SOLN
420.0000 mg | Freq: Once | INTRAVENOUS | Status: AC
  Administered 2019-05-26: 11:00:00 420 mg via INTRAVENOUS
  Filled 2019-05-26: qty 14

## 2019-05-26 MED ORDER — SODIUM CHLORIDE 0.9% FLUSH
10.0000 mL | INTRAVENOUS | Status: DC | PRN
  Administered 2019-05-26: 10 mL
  Filled 2019-05-26: qty 10

## 2019-05-26 MED ORDER — SODIUM CHLORIDE 0.9 % IV SOLN
Freq: Once | INTRAVENOUS | Status: AC
  Filled 2019-05-26: qty 250

## 2019-05-26 MED ORDER — ACETAMINOPHEN 325 MG PO TABS
ORAL_TABLET | ORAL | Status: AC
  Filled 2019-05-26: qty 2

## 2019-05-26 MED ORDER — ACETAMINOPHEN 325 MG PO TABS
650.0000 mg | ORAL_TABLET | Freq: Once | ORAL | Status: AC
  Administered 2019-05-26: 10:00:00 650 mg via ORAL

## 2019-05-26 MED ORDER — DIPHENHYDRAMINE HCL 25 MG PO CAPS
50.0000 mg | ORAL_CAPSULE | Freq: Once | ORAL | Status: AC
  Administered 2019-05-26: 10:00:00 50 mg via ORAL

## 2019-05-26 MED ORDER — DIPHENHYDRAMINE HCL 25 MG PO CAPS
ORAL_CAPSULE | ORAL | Status: AC
  Filled 2019-05-26: qty 2

## 2019-05-26 MED ORDER — TRASTUZUMAB-ANNS CHEMO 150 MG IV SOLR
6.0000 mg/kg | Freq: Once | INTRAVENOUS | Status: AC
  Administered 2019-05-26: 11:00:00 336 mg via INTRAVENOUS
  Filled 2019-05-26: qty 16

## 2019-05-26 NOTE — Patient Instructions (Signed)
[facility] Discharge Instructions for Patients Receiving Chemotherapy  Today you received the following chemotherapy agents:  Trastuzumab, Pertuzumab  To help prevent nausea and vomiting after your treatment, we encourage you to take your nausea medication as prescribed.   If you develop nausea and vomiting that is not controlled by your nausea medication, call the clinic.   BELOW ARE SYMPTOMS THAT SHOULD BE REPORTED IMMEDIATELY:  *FEVER GREATER THAN 100.5 F  *CHILLS WITH OR WITHOUT FEVER  NAUSEA AND VOMITING THAT IS NOT CONTROLLED WITH YOUR NAUSEA MEDICATION  *UNUSUAL SHORTNESS OF BREATH  *UNUSUAL BRUISING OR BLEEDING  TENDERNESS IN MOUTH AND THROAT WITH OR WITHOUT PRESENCE OF ULCERS  *URINARY PROBLEMS  *BOWEL PROBLEMS  UNUSUAL RASH Items with * indicate a potential emergency and should be followed up as soon as possible.  Feel free to call the clinic should you have any questions or concerns. The clinic phone number is [phone].  Please show the CHEMO ALERT CARD at check-in to the Emergency Department and triage nurse.   

## 2019-05-31 ENCOUNTER — Other Ambulatory Visit: Payer: Self-pay | Admitting: Student

## 2019-06-01 ENCOUNTER — Ambulatory Visit (HOSPITAL_COMMUNITY)
Admission: RE | Admit: 2019-06-01 | Discharge: 2019-06-01 | Disposition: A | Discharge status: Home / Self Care | Payer: Medicare Other | Source: Ambulatory Visit | Attending: Otolaryngology | Admitting: Otolaryngology

## 2019-06-01 ENCOUNTER — Encounter (HOSPITAL_COMMUNITY): Payer: Self-pay

## 2019-06-01 ENCOUNTER — Other Ambulatory Visit: Payer: Self-pay

## 2019-06-01 DIAGNOSIS — Z885 Allergy status to narcotic agent status: Secondary | ICD-10-CM | POA: Diagnosis not present

## 2019-06-01 DIAGNOSIS — R590 Localized enlarged lymph nodes: Secondary | ICD-10-CM | POA: Insufficient documentation

## 2019-06-01 DIAGNOSIS — G809 Cerebral palsy, unspecified: Secondary | ICD-10-CM | POA: Diagnosis not present

## 2019-06-01 DIAGNOSIS — C50919 Malignant neoplasm of unspecified site of unspecified female breast: Secondary | ICD-10-CM | POA: Diagnosis not present

## 2019-06-01 DIAGNOSIS — Z800 Family history of malignant neoplasm of digestive organs: Secondary | ICD-10-CM | POA: Insufficient documentation

## 2019-06-01 DIAGNOSIS — E785 Hyperlipidemia, unspecified: Secondary | ICD-10-CM | POA: Insufficient documentation

## 2019-06-01 DIAGNOSIS — Z853 Personal history of malignant neoplasm of breast: Secondary | ICD-10-CM | POA: Diagnosis not present

## 2019-06-01 DIAGNOSIS — I10 Essential (primary) hypertension: Secondary | ICD-10-CM | POA: Diagnosis not present

## 2019-06-01 DIAGNOSIS — K219 Gastro-esophageal reflux disease without esophagitis: Secondary | ICD-10-CM | POA: Insufficient documentation

## 2019-06-01 DIAGNOSIS — Z9013 Acquired absence of bilateral breasts and nipples: Secondary | ICD-10-CM | POA: Insufficient documentation

## 2019-06-01 DIAGNOSIS — M1990 Unspecified osteoarthritis, unspecified site: Secondary | ICD-10-CM | POA: Insufficient documentation

## 2019-06-01 DIAGNOSIS — Z79899 Other long term (current) drug therapy: Secondary | ICD-10-CM | POA: Diagnosis not present

## 2019-06-01 MED ORDER — MIDAZOLAM HCL 2 MG/2ML IJ SOLN
INTRAMUSCULAR | Status: AC
  Filled 2019-06-01: qty 2

## 2019-06-01 MED ORDER — MIDAZOLAM HCL 2 MG/2ML IJ SOLN
INTRAMUSCULAR | Status: AC | PRN
  Administered 2019-06-01 (×3): 0.5 mg via INTRAVENOUS

## 2019-06-01 MED ORDER — SODIUM CHLORIDE 0.9 % IV SOLN
INTRAVENOUS | Status: AC | PRN
  Administered 2019-06-01: 10 mL/h via INTRAVENOUS

## 2019-06-01 MED ORDER — FENTANYL CITRATE (PF) 100 MCG/2ML IJ SOLN
INTRAMUSCULAR | Status: AC
  Filled 2019-06-01: qty 2

## 2019-06-01 MED ORDER — LIDOCAINE HCL (PF) 1 % IJ SOLN
INTRAMUSCULAR | Status: AC
  Filled 2019-06-01: qty 30

## 2019-06-01 MED ORDER — SODIUM CHLORIDE 0.9 % IV SOLN: INTRAVENOUS | Status: DC

## 2019-06-01 NOTE — H&P (Signed)
Chief Complaint: Patient was seen in consultation today for left cervical LN biopsy at the request of Newman,Christopher E  Referring Physician(s): Newman,Christopher E  Supervising Physician: Arne Cleveland  Patient Status: [facility] - Out-pt  History of Present Illness: [name] is a 54 y.o. female   Pt with Cerebral palsy Hx Breast Ca 2019  Follow up imaging 04/2019: PET IMPRESSION: 1. No significant residual hypermetabolic activity associated with the known widespread osseous metastatic disease. No acute pathologic fractures. 2. Interval enlargement and increased hypermetabolic activity associated with a left level 4 cervical lymph node. This could reflect a nodal metastasis, potentially from a head and neck primary malignancy. ENT evaluation recommended. 3. Stable enlargement of the uterus with multiple hypermetabolic masses, likely due to atypical fibroids.  Follows with Dr Lindi Adie Referred to Dr Lucia Gaskins for Laryngoscopy  05/18/19: Patient with clear upper airway examination on fiberoptic laryngoscopy with no identifiable abnormalities noted. She had mildly prominent adenoid tissue. Assessment: Left neck hypermetabolic lymphadenopathy noted on recent PET scan. Questionable etiology. Normal upper airway examination in the office. Plan: Would recommend obtaining ultrasound-guided FNA of the left neck node if possible. We will try to schedule this.  Scheduled now for same  Past Medical History:  Diagnosis Date  . Arthritis   . Cerebral palsy ([place])   . Complication of anesthesia 12/09/2013   Respiratory instability with 10 mg Propofol.Poor gag reflex.  . Constipation - functional   . Dysphagia 2003   2O to GERD/HH, ?difficulty with large food bolus due to neuromuscular weakness  . Epigastric pain MAY 2012 ABD [place] NL GBLIVPAN   2o to GERD V. NON-ULCER DYSPEPSIA  . GERD (gastroesophageal reflux disease) 2007   BPE NL ESO, REFLUX  . Hiatal hernia   . HIATAL  HERNIA 11/23/2008   Qualifier: Diagnosis of  By: Zeb Comfort    . HTN (hypertension)   . Hyperlipidemia     Past Surgical History:  Procedure Laterality Date  . COLONOSCOPY  APR 2009 with proprofol   SLF: SIMPLE ADENOMAS  . MASTECTOMY W/ SENTINEL NODE BIOPSY Bilateral 05/01/2018   Procedure: BILATERAL MASTECTOMIES WITH LEFT SENTINEL LYMPH NODE BIOPSIES AND RIGHT AXILLARY LYMPH NODE DISSECTION;  Surgeon: Alphonsa Overall, MD;  Location: [facility];  Service: General;  Laterality: Bilateral;  . PORTACATH PLACEMENT N/A 05/01/2018   Procedure: INSERTION PORT-A-CATH WITH [place];  Surgeon: Alphonsa Overall, MD;  Location: [facility];  Service: General;  Laterality: N/A;  . UPPER GASTROINTESTINAL ENDOSCOPY  MAY 2012 DIL 16 mm   SLF: NO DEFINITE STRICTURE APPRECIATED  . UPPER GASTROINTESTINAL ENDOSCOPY  FEB 2009-HYPOXIA REQUIRING NARCAN, & VERSED, D50V4   SLF: NL ESO, FUNDIC GLAND POLYPS  . UPPER GASTROINTESTINAL ENDOSCOPY  w/ DIL 2003, 2004, 2006    Allergies: Hydrocodone  Medications: Prior to Admission medications   Medication Sig Start Date End Date Taking? Authorizing Provider  diphenoxylate-atropine (LOMOTIL) 2.5-0.025 MG tablet TAKE 1 TO 2 TABLETS BY MOUTH 4 TIMES DAILY AS NEEDED DIARRHEA 01/07/19   Nicholas Lose, MD  ibuprofen (ADVIL,MOTRIN) 600 MG tablet Take 1 tablet (600 mg total) by mouth every 6 (six) hours as needed (for mild pain not relieved by other medications.). 05/02/18   Meuth, Brooke A, PA-C  lisinopril (ZESTRIL) 40 MG tablet Take 1 tablet (40 mg total) by mouth daily. 09/23/18   Janora Norlander, DO  megestrol (MEGACE) 40 MG tablet TAKE 3 TABLETS EVERY DAY 10/21/18   Florian Buff, MD  megestrol (MEGACE) 40 MG tablet Take 1 tablet (  40 mg total) by mouth 2 (two) times daily. Take 3 tablets every day 10/07/18   Nicholas Lose, MD  ondansetron (ZOFRAN-ODT) 4 MG disintegrating tablet Take 1 tablet (4 mg total) by mouth every 6 (six) hours as needed for nausea. 05/02/18   Meuth, Brooke A, PA-C   pantoprazole (PROTONIX) 40 MG tablet TAKE 1 TABLET BY MOUTH 30 MINUTES PRIOR TO BREAKFAST AND SUPPER 09/23/18   Ronnie Doss M, DO  polyethylene glycol (MIRALAX / GLYCOLAX) packet Take 17 g by mouth daily as needed for moderate constipation.    [provider]  potassium chloride (KLOR-CON) 10 MEQ tablet TAKE 1 TABLET BY MOUTH EVERY DAY 05/24/19   Nicholas Lose, MD  pravastatin (PRAVACHOL) 40 MG tablet Take 1 tablet (40 mg total) by mouth daily. (Needs to be seen before next refill) 08/26/18   Nicholas Lose, MD  tamoxifen (NOLVADEX) 20 MG tablet Take 1 tablet (20 mg total) by mouth daily. 08/26/18   Nicholas Lose, MD  prochlorperazine (COMPAZINE) 10 MG tablet Take 1 tablet (10 mg total) by mouth every 6 (six) hours as needed (Nausea or vomiting). 05/11/18 06/04/18  Nicholas Lose, MD     Family History  Problem Relation Age of Onset  . Colon cancer Mother        LATE 50S    Social History   Socioeconomic History  . Marital status: Single    Spouse name: Not on file  . Number of children: Not on file  . Years of education: Not on file  . Highest education level: Not on file  Occupational History  . Not on file  Tobacco Use  . Smoking status: Never Smoker  . Smokeless tobacco: Never Used  Substance and Sexual Activity  . Alcohol use: No  . Drug use: Yes    Types: PCP  . Sexual activity: Not Currently    Birth control/protection: None  Other Topics Concern  . Not on file  Social History Narrative  . Not on file   Social Determinants of Health   Financial Resource Strain:   . Difficulty of Paying Living Expenses: Not on file  Food Insecurity:   . Worried About Charity fundraiser in the Last Year: Not on file  . Ran Out of Food in the Last Year: Not on file  Transportation Needs:   . Lack of Transportation (Medical): Not on file  . Lack of Transportation (Non-Medical): Not on file  Physical Activity:   . Days of Exercise per Week: Not on file  . Minutes of  Exercise per Session: Not on file  Stress:   . Feeling of Stress : Not on file  Social Connections:   . Frequency of Communication with Friends and Family: Not on file  . Frequency of Social Gatherings with Friends and Family: Not on file  . Attends Religious Services: Not on file  . Active Member of Clubs or Organizations: Not on file  . Attends Archivist Meetings: Not on file  . Marital Status: Not on file    Review of Systems: A 12 point ROS discussed and pertinent positives are indicated in the HPI above.  All other systems are negative.  Review of Systems  Constitutional: Negative for activity change, fatigue and fever.  HENT: Negative for sore throat.   Gastrointestinal: Negative for abdominal pain.  Neurological:       Pt with Cerebral palsy  Psychiatric/Behavioral: Negative for agitation.    Vital Signs: BP (!) 164/100 Comment: notified  rn, manually  Pulse 92   Temp 98.8 F (37.1 C)   Ht 5\' 2"  (1.575 m)   Wt 126 lb (57.2 kg)   SpO2 98%   BMI 23.05 kg/m   Physical Exam Vitals reviewed.  Cardiovascular:     Rate and Rhythm: Normal rate and regular rhythm.     Heart sounds: Normal heart sounds.  Pulmonary:     Effort: Pulmonary effort is normal.     Breath sounds: Normal breath sounds.  Abdominal:     Palpations: Abdomen is soft.  Skin:    General: Skin is warm and dry.  Neurological:     Mental Status: She is alert.  Psychiatric:     Comments: Sister/caretaker at bedside     Imaging: No results found.  Labs:  CBC: Recent Labs    02/03/19 1340 03/03/19 1014 03/31/19 0900 04/28/19 0913  WBC 7.6 9.4 7.6 8.4  HGB 11.7* 11.8* 11.8* 11.9*  HCT 35.6* 35.5* 36.6 36.4  PLT 341 331 311 335    COAGS: No results for input(s): INR, APTT in the last 8760 hours.  BMP: Recent Labs    02/03/19 1340 03/03/19 1014 03/31/19 0900 04/28/19 0913  NA 141 143 141 142  K 3.6 3.6 4.0 3.8  CL 109 109 108 110  CO2 25 23 23 22   GLUCOSE 97 94  118* 106*  BUN 9 12 14 17   CALCIUM 9.1 8.8* 8.8* 8.9  CREATININE 0.66 0.63 0.72 0.68  GFRNONAA >60 >60 >60 >60  GFRAA >60 >60 >60 >60    LIVER FUNCTION TESTS: Recent Labs    02/03/19 1340 03/03/19 1014 03/31/19 0900 04/28/19 0913  BILITOT 0.3 0.4 0.3 0.3  AST 11* 11* 10* 14*  ALT 10 14 12 13   ALKPHOS 71 65 61 63  PROT 7.2 7.2 7.4 7.4  ALBUMIN 3.9 3.6 3.7 3.8    TUMOR MARKERS: No results for input(s): AFPTM, CEA, CA199, CHROMGRNA in the last 8760 hours.  Assessment and Plan:  Cerebral palsy Hx Breast Ca 2019 1 yr follow up PET revealing + cervical Lymphadenopathy Laryngoscopy wnl Now scheduled for left cervical LN bx Risks and benefits of left cervical Lymph node biopsy was discussed with the patient and/or patient's family including, but not limited to bleeding, infection, damage to adjacent structures or low yield requiring additional tests.  All of the questions were answered and there is agreement to proceed.  Consent signed and in chart.   Thank you for this interesting consult.  I greatly enjoyed meeting KERIANN GALLOGLY and look forward to participating in their care.  A copy of this report was sent to the requesting provider on this date.  Electronically Signed: Lavonia Drafts, PA-C 06/01/2019, 11:48 AM   I spent a total of  30 Minutes   in face to face in clinical consultation, greater than 50% of which was counseling/coordinating care for L cervical LN bx

## 2019-06-01 NOTE — Procedures (Signed)
  Procedure: [place] FNA L cervical LAN   EBL:   minimal Complications:  none immediate  See full dictation in [employer].  Dillard Cannon MD Main # [phone] Pager  [phone]

## 2019-06-01 NOTE — Discharge Instructions (Signed)

## 2019-06-02 LAB — CYTOLOGY - NON PAP

## 2019-06-15 ENCOUNTER — Telehealth (INDEPENDENT_AMBULATORY_CARE_PROVIDER_SITE_OTHER): Payer: Self-pay | Admitting: Otolaryngology

## 2019-06-15 NOTE — Telephone Encounter (Signed)
Discussed with sister who is her caregiver concerning results of the cytology from FNA.  This showed mixed lymphoid population with no malignant cells identified.  Did not recommend any further intervention unless this substantially enlarges.

## 2019-06-17 ENCOUNTER — Other Ambulatory Visit: Payer: Self-pay | Admitting: *Deleted

## 2019-06-17 DIAGNOSIS — C50411 Malignant neoplasm of upper-outer quadrant of right female breast: Secondary | ICD-10-CM

## 2019-06-17 DIAGNOSIS — Z5181 Encounter for therapeutic drug level monitoring: Secondary | ICD-10-CM

## 2019-06-19 ENCOUNTER — Other Ambulatory Visit: Payer: Self-pay | Admitting: Hematology and Oncology

## 2019-06-22 ENCOUNTER — Ambulatory Visit (HOSPITAL_COMMUNITY)
Admission: RE | Admit: 2019-06-22 | Discharge: 2019-06-22 | Disposition: A | Discharge status: Home / Self Care | Payer: Medicare Other | Source: Ambulatory Visit | Attending: Hematology and Oncology | Admitting: Hematology and Oncology

## 2019-06-22 ENCOUNTER — Other Ambulatory Visit: Payer: Self-pay

## 2019-06-22 DIAGNOSIS — Z170 Estrogen receptor positive status [ER+]: Secondary | ICD-10-CM | POA: Diagnosis not present

## 2019-06-22 DIAGNOSIS — Z5181 Encounter for therapeutic drug level monitoring: Secondary | ICD-10-CM | POA: Insufficient documentation

## 2019-06-22 DIAGNOSIS — C50411 Malignant neoplasm of upper-outer quadrant of right female breast: Secondary | ICD-10-CM | POA: Diagnosis not present

## 2019-06-22 DIAGNOSIS — Z79899 Other long term (current) drug therapy: Secondary | ICD-10-CM | POA: Insufficient documentation

## 2019-06-22 DIAGNOSIS — I080 Rheumatic disorders of both mitral and aortic valves: Secondary | ICD-10-CM | POA: Insufficient documentation

## 2019-06-22 NOTE — Progress Notes (Signed)
  Echocardiogram 2D Echocardiogram has been performed.  Jennette Dubin 06/22/2019, 10:46 AM

## 2019-06-23 ENCOUNTER — Inpatient Hospital Stay: Payer: Medicare Other | Attending: Hematology and Oncology

## 2019-06-23 ENCOUNTER — Other Ambulatory Visit: Payer: Self-pay

## 2019-06-23 VITALS — BP 120/71 | HR 83 | Temp 98.2°F | Resp 18

## 2019-06-23 DIAGNOSIS — C7951 Secondary malignant neoplasm of bone: Secondary | ICD-10-CM | POA: Insufficient documentation

## 2019-06-23 DIAGNOSIS — Z79811 Long term (current) use of aromatase inhibitors: Secondary | ICD-10-CM | POA: Insufficient documentation

## 2019-06-23 DIAGNOSIS — C50411 Malignant neoplasm of upper-outer quadrant of right female breast: Secondary | ICD-10-CM | POA: Diagnosis not present

## 2019-06-23 DIAGNOSIS — Z5112 Encounter for antineoplastic immunotherapy: Secondary | ICD-10-CM | POA: Insufficient documentation

## 2019-06-23 DIAGNOSIS — Z170 Estrogen receptor positive status [ER+]: Secondary | ICD-10-CM | POA: Insufficient documentation

## 2019-06-23 DIAGNOSIS — Z7189 Other specified counseling: Secondary | ICD-10-CM

## 2019-06-23 MED ORDER — TRASTUZUMAB-ANNS CHEMO 150 MG IV SOLR
6.0000 mg/kg | Freq: Once | INTRAVENOUS | Status: AC
  Administered 2019-06-23: 10:00:00 336 mg via INTRAVENOUS
  Filled 2019-06-23: qty 16

## 2019-06-23 MED ORDER — DIPHENHYDRAMINE HCL 25 MG PO CAPS
50.0000 mg | ORAL_CAPSULE | Freq: Once | ORAL | Status: AC
  Administered 2019-06-23: 50 mg via ORAL

## 2019-06-23 MED ORDER — HEPARIN SOD (PORK) LOCK FLUSH 100 UNIT/ML IV SOLN
500.0000 [IU] | Freq: Once | INTRAVENOUS | Status: AC | PRN
  Administered 2019-06-23: 500 [IU]
  Filled 2019-06-23: qty 5

## 2019-06-23 MED ORDER — SODIUM CHLORIDE 0.9% FLUSH
10.0000 mL | INTRAVENOUS | Status: DC | PRN
  Administered 2019-06-23: 10 mL
  Filled 2019-06-23: qty 10

## 2019-06-23 MED ORDER — SODIUM CHLORIDE 0.9 % IV SOLN
Freq: Once | INTRAVENOUS | Status: AC
  Filled 2019-06-23: qty 250

## 2019-06-23 MED ORDER — DIPHENHYDRAMINE HCL 25 MG PO CAPS
ORAL_CAPSULE | ORAL | Status: AC
  Filled 2019-06-23: qty 2

## 2019-06-23 MED ORDER — ACETAMINOPHEN 325 MG PO TABS
ORAL_TABLET | ORAL | Status: AC
  Filled 2019-06-23: qty 2

## 2019-06-23 MED ORDER — SODIUM CHLORIDE 0.9 % IV SOLN
420.0000 mg | Freq: Once | INTRAVENOUS | Status: AC
  Administered 2019-06-23: 420 mg via INTRAVENOUS
  Filled 2019-06-23: qty 14

## 2019-06-23 MED ORDER — ACETAMINOPHEN 325 MG PO TABS
650.0000 mg | ORAL_TABLET | Freq: Once | ORAL | Status: AC
  Administered 2019-06-23: 650 mg via ORAL

## 2019-06-23 NOTE — Patient Instructions (Signed)
[facility] Discharge Instructions for Patients Receiving Chemotherapy  Today you received the following chemotherapy agents:  Trastuzumab, Pertuzumab  To help prevent nausea and vomiting after your treatment, we encourage you to take your nausea medication as prescribed.   If you develop nausea and vomiting that is not controlled by your nausea medication, call the clinic.   BELOW ARE SYMPTOMS THAT SHOULD BE REPORTED IMMEDIATELY:  *FEVER GREATER THAN 100.5 F  *CHILLS WITH OR WITHOUT FEVER  NAUSEA AND VOMITING THAT IS NOT CONTROLLED WITH YOUR NAUSEA MEDICATION  *UNUSUAL SHORTNESS OF BREATH  *UNUSUAL BRUISING OR BLEEDING  TENDERNESS IN MOUTH AND THROAT WITH OR WITHOUT PRESENCE OF ULCERS  *URINARY PROBLEMS  *BOWEL PROBLEMS  UNUSUAL RASH Items with * indicate a potential emergency and should be followed up as soon as possible.  Feel free to call the clinic should you have any questions or concerns. The clinic phone number is [phone].  Please show the CHEMO ALERT CARD at check-in to the Emergency Department and triage nurse.   

## 2019-07-13 ENCOUNTER — Telehealth: Payer: Self-pay

## 2019-07-13 DIAGNOSIS — D126 Benign neoplasm of colon, unspecified: Secondary | ICD-10-CM

## 2019-07-13 NOTE — Addendum Note (Signed)
Addended by: Cheron Every on: 07/13/2019 03:00 PM   Modules accepted: Orders

## 2019-07-13 NOTE — Telephone Encounter (Signed)
Please refer to [facility] for repeat tcs.

## 2019-07-13 NOTE — Telephone Encounter (Signed)
Referral sent 

## 2019-07-13 NOTE — Telephone Encounter (Signed)
-----   Message from Danie Binder, MD sent at 07/09/2019 12:30 PM EST ----- yes ----- Message ----- From: Claudina Lick, LPN Sent: [id number]   4:05 PM EST To: Danie Binder, MD  Dr.Fields,  This pt is on our recall for a tcs in April. The last time we tried a tcs in 2015, she developed some breathing issues and you sent her to [facility] to have it done. Should she go back to [facility] again for repeat tcs?

## 2019-07-15 ENCOUNTER — Other Ambulatory Visit: Payer: Self-pay | Admitting: Hematology and Oncology

## 2019-07-20 NOTE — Progress Notes (Signed)
Patient Care Team: Janora Norlander, DO as PCP - General (Family Medicine) Danie Binder, MD (Gastroenterology) Alphonsa Overall, MD as Consulting Physician (General Surgery) Nicholas Lose, MD as Consulting Physician (Hematology and Oncology) Kyung Rudd, MD as Consulting Physician (Radiation Oncology) Mauro Kaufmann, RN as Oncology Nurse Navigator Rockwell Germany, RN as Oncology Nurse Navigator  DIAGNOSIS:    ICD-10-CM   1. Malignant neoplasm of upper-outer quadrant of right breast in female, estrogen receptor positive ([place])  C50.411    Z17.0     SUMMARY OF ONCOLOGIC HISTORY: Oncology History  Malignant neoplasm of upper-outer quadrant of right breast in female, estrogen receptor positive ([place])  03/18/2018 Initial Diagnosis   Screening detected bilateral breast abnormalities.  Right breast mass UOQ 2.1 cm at 10 o'clock position: Grade 1 ILC ER 70%, PR 0%, HER-2 +3+ by IHC, Ki-67 5%, suspected lymph node in the axilla could not be biopsied; left breast calcifications by ultrasound not visible but 5 cm cystic/tubular structure which on biopsy was fibrocystic change with ADHl; T2N0 stage IIa clinical stage   05/01/2018 Surgery   Bilateral mastectomies: Left mastectomy: DCIS intermediate grade 0.9 cm margins negative Tis NX stage 0; right mastectomy: ILC, grade 3, 3.5 cm, with LCIS, perineural invasion present, margins negative, 15/22 lymph nodes positive with extracapsular extension, ER 90%, PR 0%, HER-2 +(IHC 3+), Ki-67 5%, T2N3 Stage 3B    05/11/2018 Cancer Staging   Staging form: Breast, AJCC 8th Edition - Pathologic stage from 05/11/2018: Stage IIIB (pT2, pN3, cM0, G3, ER+, PR-, HER2+) - Signed by Nicholas Lose, MD on 05/11/2018   06/03/2018 Imaging   Bone scan: Increased tracer uptake in the lumbar spine at L1, L3 and L4, right fourth and seventh ribs, inferior right scapula and distal sternum, T7 and T11 as well.   CT CAP showed widespread bone metastases with possible  pathologic fracture at L3, peripheral right upper lobe nodules nonspecific   06/10/2018 -  Chemotherapy   Taxol Kingenti and Perjeta    10/27/2018 PET scan   Bone metastases less metabolically active.  Couple of lymph nodes with low-level metabolic activity noted.     CHIEF COMPLIANT: Follow-up of Kingenti maintenance andanastrozole  INTERVAL HISTORY: [name] is a 54 y.o. with above-mentioned history of metastatic breast cancer currently on treatment withmaintenance Kingenti and on anti-estrogen therapy withanastrozole. Left cervical lymph node biopsy on 06/01/19 showed no evidence of malignancy. Echo on 06/22/19 showed an ejection fraction of 60-65%. She presents to the clinic today for treatment.   ALLERGIES:  is allergic to hydrocodone.  MEDICATIONS:  Current Outpatient Medications  Medication Sig Dispense Refill  . diphenoxylate-atropine (LOMOTIL) 2.5-0.025 MG tablet TAKE 1 TO 2 TABLETS BY MOUTH 4 TIMES DAILY AS NEEDED DIARRHEA 40 tablet 2  . ibuprofen (ADVIL,MOTRIN) 600 MG tablet Take 1 tablet (600 mg total) by mouth every 6 (six) hours as needed (for mild pain not relieved by other medications.). 30 tablet 0  . lisinopril (ZESTRIL) 40 MG tablet Take 1 tablet (40 mg total) by mouth daily. 90 tablet 3  . megestrol (MEGACE) 40 MG tablet TAKE 3 TABLETS EVERY DAY 270 tablet 11  . megestrol (MEGACE) 40 MG tablet Take 1 tablet (40 mg total) by mouth 2 (two) times daily. Take 3 tablets every day 180 tablet 3  . ondansetron (ZOFRAN-ODT) 4 MG disintegrating tablet Take 1 tablet (4 mg total) by mouth every 6 (six) hours as needed for nausea. 15 tablet 0  . pantoprazole (PROTONIX) 40  MG tablet TAKE 1 TABLET BY MOUTH 30 MINUTES PRIOR TO BREAKFAST AND SUPPER 180 tablet 3  . polyethylene glycol (MIRALAX / GLYCOLAX) packet Take 17 g by mouth daily as needed for moderate constipation.    . potassium chloride (KLOR-CON) 10 MEQ tablet TAKE 1 TABLET BY MOUTH EVERY DAY 30 tablet 1  . pravastatin  (PRAVACHOL) 40 MG tablet Take 1 tablet (40 mg total) by mouth daily. (Needs to be seen before next refill) 90 tablet 3  . tamoxifen (NOLVADEX) 20 MG tablet Take 1 tablet (20 mg total) by mouth daily. 90 tablet 3   No current facility-administered medications for this visit.    PHYSICAL EXAMINATION: ECOG PERFORMANCE STATUS: 1 - Symptomatic but completely ambulatory  Vitals:   07/21/19 0911  BP: (!) 155/93  Pulse: 90  Resp: 17  Temp: 98.3 F (36.8 C)  SpO2: 100%   Filed Weights   07/21/19 0911  Weight: 134 lb 8 oz (61 kg)    LABORATORY DATA:  I have reviewed the data as listed CMP Latest Ref Rng & Units 07/21/2019 04/28/2019 03/31/2019  Glucose 70 - 99 mg/dL 134(H) 106(H) 118(H)  BUN 6 - 20 mg/dL 15 17 14   Creatinine 0.44 - 1.00 mg/dL 0.71 0.68 0.72  Sodium 135 - 145 mmol/L 141 142 141  Potassium 3.5 - 5.1 mmol/L 3.9 3.8 4.0  Chloride 98 - 111 mmol/L 109 110 108  CO2 22 - 32 mmol/L 25 22 23   Calcium 8.9 - 10.3 mg/dL 8.8(L) 8.9 8.8(L)  Total Protein 6.5 - 8.1 g/dL 7.3 7.4 7.4  Total Bilirubin 0.3 - 1.2 mg/dL 0.3 0.3 0.3  Alkaline Phos 38 - 126 U/L 65 63 61  AST 15 - 41 U/L 14(L) 14(L) 10(L)  ALT 0 - 44 U/L 15 13 12     Lab Results  Component Value Date   WBC 10.0 07/21/2019   HGB 12.4 07/21/2019   HCT 37.1 07/21/2019   MCV 92.8 07/21/2019   PLT 332 07/21/2019   NEUTROABS 6.9 07/21/2019    ASSESSMENT & PLAN:  Malignant neoplasm of upper-outer quadrant of right breast in female, estrogen receptor positive ([place]) 05/01/18:Bilateral mastectomies: Left mastectomy: DCIS intermediate grade 0.9 cm margins negative Tis NX stage 0; right mastectomy: ILC, grade 3, 3.5 cm, with LCIS, perineural invasion present, margins negative, 15/22 lymph nodes positive with extracapsular extension, ER 90%, PR 0%, HER-2 +(IHC 3+), Ki-67 5%, T2N3M1Stage IV  06/03/2018 bone scan: Increased tracer uptake in the lumbar spine at L1, L3 and L4, right fourth and seventh ribs, inferior right scapula  and distal sternum, T7 and T11 as well.  CT CAPshowed widespread bone metastases with possible pathologic fracture at L3, peripheral right upper lobe nodules nonspecific Patient has cerebral palsy and hersisteris the power of attorney for healthcare Treatment plan: Palliative treatment with Taxolx12 cycles completed 08/26/2018,Kingenti, Perjeta ------------------------------------------------------------------------------------------------------------------------ PET CT scan:10/27/2018:Widespread bone metastatic disease demonstrates reduced with sclerosis and low metabolic activity with SUV of 3-4. Left level 4 lymph node 0.5 cm and left deep parotid lymph node SUV 4.1 will need follow-up. Uterine leiomyomas.  Current treatment:Kingenti maintenance along with anastrozole  Anastrozole toxicities: Denies any hot flashes or myalgias. Kingenti toxicities: None Intermittent diarrhea  PET CT scan 04/26/2019: No significant residual hypermetabolic activity associated with the bone metastases. Interval enlargement and increased metabolic activity in the level 4 left cervical lymph node measuring 8 mm was previously 5 mm with SUV 5.9.  Return to clinic every 4 weeks for Kingenti and every 12 weeks  for follow-up with me with labs Scans will be done in June.   No orders of the defined types were placed in this encounter.  The patient has a good understanding of the overall plan. she agrees with it. she will call with any problems that may develop before the next visit here.  Total time spent: 30 mins including face to face time and time spent for planning, charting and coordination of care  Nicholas Lose, MD 07/21/2019  I, Cloyde Reams Dorshimer, am acting as scribe for Dr. Nicholas Lose.  I have reviewed the above documentation for accuracy and completeness, and I agree with the above.

## 2019-07-21 ENCOUNTER — Inpatient Hospital Stay (HOSPITAL_BASED_OUTPATIENT_CLINIC_OR_DEPARTMENT_OTHER): Payer: Medicare Other | Admitting: Hematology and Oncology

## 2019-07-21 ENCOUNTER — Inpatient Hospital Stay: Payer: Medicare Other

## 2019-07-21 ENCOUNTER — Other Ambulatory Visit: Payer: Self-pay

## 2019-07-21 ENCOUNTER — Inpatient Hospital Stay: Payer: Medicare Other | Attending: Hematology and Oncology

## 2019-07-21 ENCOUNTER — Encounter (INDEPENDENT_AMBULATORY_CARE_PROVIDER_SITE_OTHER): Payer: Self-pay

## 2019-07-21 DIAGNOSIS — C7951 Secondary malignant neoplasm of bone: Secondary | ICD-10-CM | POA: Insufficient documentation

## 2019-07-21 DIAGNOSIS — Z791 Long term (current) use of non-steroidal anti-inflammatories (NSAID): Secondary | ICD-10-CM | POA: Insufficient documentation

## 2019-07-21 DIAGNOSIS — C50411 Malignant neoplasm of upper-outer quadrant of right female breast: Secondary | ICD-10-CM | POA: Insufficient documentation

## 2019-07-21 DIAGNOSIS — Z5112 Encounter for antineoplastic immunotherapy: Secondary | ICD-10-CM | POA: Insufficient documentation

## 2019-07-21 DIAGNOSIS — Z9221 Personal history of antineoplastic chemotherapy: Secondary | ICD-10-CM | POA: Insufficient documentation

## 2019-07-21 DIAGNOSIS — Z79810 Long term (current) use of selective estrogen receptor modulators (SERMs): Secondary | ICD-10-CM | POA: Insufficient documentation

## 2019-07-21 DIAGNOSIS — Z9013 Acquired absence of bilateral breasts and nipples: Secondary | ICD-10-CM | POA: Diagnosis not present

## 2019-07-21 DIAGNOSIS — Z7189 Other specified counseling: Secondary | ICD-10-CM

## 2019-07-21 DIAGNOSIS — Z170 Estrogen receptor positive status [ER+]: Secondary | ICD-10-CM

## 2019-07-21 DIAGNOSIS — Z86000 Personal history of in-situ neoplasm of breast: Secondary | ICD-10-CM | POA: Insufficient documentation

## 2019-07-21 DIAGNOSIS — Z79899 Other long term (current) drug therapy: Secondary | ICD-10-CM | POA: Diagnosis not present

## 2019-07-21 LAB — CMP (CANCER CENTER ONLY)
ALT: 15 U/L (ref 0–44)
AST: 14 U/L — ABNORMAL LOW (ref 15–41)
Albumin: 3.6 g/dL (ref 3.5–5.0)
Alkaline Phosphatase: 65 U/L (ref 38–126)
Anion gap: 7 (ref 5–15)
BUN: 15 mg/dL (ref 6–20)
CO2: 25 mmol/L (ref 22–32)
Calcium: 8.8 mg/dL — ABNORMAL LOW (ref 8.9–10.3)
Chloride: 109 mmol/L (ref 98–111)
Creatinine: 0.71 mg/dL (ref 0.44–1.00)
GFR, Est AFR Am: >60 mL/min (ref >60)
GFR, Estimated: >60 mL/min (ref >60)
Glucose, Bld: 134 mg/dL — ABNORMAL HIGH (ref 70–99)
Potassium: 3.9 mmol/L (ref 3.5–5.1)
Sodium: 141 mmol/L (ref 135–145)
Total Bilirubin: 0.3 mg/dL (ref 0.3–1.2)
Total Protein: 7.3 g/dL (ref 6.5–8.1)

## 2019-07-21 LAB — CBC WITH DIFFERENTIAL (CANCER CENTER ONLY)
Abs Immature Granulocytes: 0.04 10*3/uL (ref 0.00–0.07)
Basophils Absolute: 0.1 10*3/uL (ref 0.0–0.1)
Basophils Relative: 1 %
Eosinophils Absolute: 0.1 10*3/uL (ref 0.0–0.5)
Eosinophils Relative: 1 %
HCT: 37.1 % (ref 36.0–46.0)
Hemoglobin: 12.4 g/dL (ref 12.0–15.0)
Immature Granulocytes: 0 %
Lymphocytes Relative: 23 %
Lymphs Abs: 2.3 10*3/uL (ref 0.7–4.0)
MCH: 31 pg (ref 26.0–34.0)
MCHC: 33.4 g/dL (ref 30.0–36.0)
MCV: 92.8 fL (ref 80.0–100.0)
Monocytes Absolute: 0.5 10*3/uL (ref 0.1–1.0)
Monocytes Relative: 5 %
Neutro Abs: 6.9 10*3/uL (ref 1.7–7.7)
Neutrophils Relative %: 70 %
Platelet Count: 332 10*3/uL (ref 150–400)
RBC: 4 MIL/uL (ref 3.87–5.11)
RDW: 13.1 % (ref 11.5–15.5)
WBC Count: 10 10*3/uL (ref 4.0–10.5)
nRBC: 0 % (ref 0.0–0.2)

## 2019-07-21 MED ORDER — DIPHENHYDRAMINE HCL 25 MG PO CAPS
50.0000 mg | ORAL_CAPSULE | Freq: Once | ORAL | Status: AC
  Administered 2019-07-21: 50 mg via ORAL

## 2019-07-21 MED ORDER — ACETAMINOPHEN 325 MG PO TABS
650.0000 mg | ORAL_TABLET | Freq: Once | ORAL | Status: AC
  Administered 2019-07-21: 10:00:00 650 mg via ORAL

## 2019-07-21 MED ORDER — SODIUM CHLORIDE 0.9 % IV SOLN
Freq: Once | INTRAVENOUS | Status: AC
  Filled 2019-07-21: qty 250

## 2019-07-21 MED ORDER — SODIUM CHLORIDE 0.9 % IV SOLN
420.0000 mg | Freq: Once | INTRAVENOUS | Status: AC
  Administered 2019-07-21: 11:00:00 420 mg via INTRAVENOUS
  Filled 2019-07-21: qty 14

## 2019-07-21 MED ORDER — ACETAMINOPHEN 325 MG PO TABS
ORAL_TABLET | ORAL | Status: AC
  Filled 2019-07-21: qty 2

## 2019-07-21 MED ORDER — DIPHENHYDRAMINE HCL 25 MG PO CAPS
ORAL_CAPSULE | ORAL | Status: AC
  Filled 2019-07-21: qty 2

## 2019-07-21 MED ORDER — SODIUM CHLORIDE 0.9% FLUSH
10.0000 mL | INTRAVENOUS | Status: DC | PRN
  Administered 2019-07-21: 10 mL
  Filled 2019-07-21: qty 10

## 2019-07-21 MED ORDER — HEPARIN SOD (PORK) LOCK FLUSH 100 UNIT/ML IV SOLN
500.0000 [IU] | Freq: Once | INTRAVENOUS | Status: AC | PRN
  Administered 2019-07-21: 12:00:00 500 [IU]
  Filled 2019-07-21: qty 5

## 2019-07-21 MED ORDER — TRASTUZUMAB-ANNS CHEMO 150 MG IV SOLR
6.0000 mg/kg | Freq: Once | INTRAVENOUS | Status: AC
  Administered 2019-07-21: 336 mg via INTRAVENOUS
  Filled 2019-07-21: qty 16

## 2019-07-21 NOTE — Patient Instructions (Signed)
[facility] Discharge Instructions for Patients Receiving Chemotherapy  Today you received the following chemotherapy agents:  Trastuzumab, Pertuzumab  To help prevent nausea and vomiting after your treatment, we encourage you to take your nausea medication as prescribed.   If you develop nausea and vomiting that is not controlled by your nausea medication, call the clinic.   BELOW ARE SYMPTOMS THAT SHOULD BE REPORTED IMMEDIATELY:  *FEVER GREATER THAN 100.5 F  *CHILLS WITH OR WITHOUT FEVER  NAUSEA AND VOMITING THAT IS NOT CONTROLLED WITH YOUR NAUSEA MEDICATION  *UNUSUAL SHORTNESS OF BREATH  *UNUSUAL BRUISING OR BLEEDING  TENDERNESS IN MOUTH AND THROAT WITH OR WITHOUT PRESENCE OF ULCERS  *URINARY PROBLEMS  *BOWEL PROBLEMS  UNUSUAL RASH Items with * indicate a potential emergency and should be followed up as soon as possible.  Feel free to call the clinic should you have any questions or concerns. The clinic phone number is [phone].  Please show the CHEMO ALERT CARD at check-in to the Emergency Department and triage nurse.   

## 2019-07-21 NOTE — Assessment & Plan Note (Signed)
05/01/18:Bilateral mastectomies: Left mastectomy: DCIS intermediate grade 0.9 cm margins negative Tis NX stage 0; right mastectomy: ILC, grade 3, 3.5 cm, with LCIS, perineural invasion present, margins negative, 15/22 lymph nodes positive with extracapsular extension, ER 90%, PR 0%, HER-2 +(IHC 3+), Ki-67 5%, T2N3M1Stage IV  06/03/2018 bone scan: Increased tracer uptake in the lumbar spine at L1, L3 and L4, right fourth and seventh ribs, inferior right scapula and distal sternum, T7 and T11 as well.  CT CAPshowed widespread bone metastases with possible pathologic fracture at L3, peripheral right upper lobe nodules nonspecific Patient has cerebral palsy and hersisteris the power of attorney for healthcare Treatment plan: Palliative treatment with Taxolx12 cycles completed 08/26/2018,Kingenti, Perjeta ------------------------------------------------------------------------------------------------------------------------ PET CT scan:10/27/2018:Widespread bone metastatic disease demonstrates reduced with sclerosis and low metabolic activity with SUV of 3-4. Left level 4 lymph node 0.5 cm and left deep parotid lymph node SUV 4.1 will need follow-up. Uterine leiomyomas.  Current treatment:Kingenti maintenance along with anastrozole  Anastrozole toxicities: Denies any hot flashes or myalgias. Kingenti toxicities: None Intermittent diarrhea  PET CT scan 04/26/2019: No significant residual hypermetabolic activity associated with the bone metastases. Interval enlargement and increased metabolic activity in the level 4 left cervical lymph node measuring 8 mm was previously 5 mm with SUV 5.9.  Return to clinic every 4 weeks for Kingenti and every 12 weeks for follow-up with me with labs

## 2019-07-21 NOTE — Patient Instructions (Signed)
Tunneled Central Venous Catheter Flushing Guide  It is important to flush your tunneled central venous catheter each time you use it, both before and after you use it. Flushing your catheter will help prevent it from clogging. What are the risks? Risks may include:  Infection.  Air getting into the catheter and bloodstream. Supplies needed:  A clean pair of gloves.  A disinfecting wipe. Use an alcohol wipe, chlorhexidine wipe, or iodine wipe as told by your health care provider.  A 10 mL syringe that has been prefilled with saline solution.  An empty 10 mL syringe, if a substance called heparin was injected into your catheter. How to flush your catheter When you flush your catheter, make sure you follow any specific instructions from your health care provider or the manufacturer. These are general guidelines. Flushing your catheter before use If there is heparin in your catheter: 1. Wash your hands with soap and water. 2. Put on gloves. 3. Scrub the injection cap for a minimum of 15 seconds with a disinfecting wipe. 4. Unclamp the catheter. 5. Attach the empty syringe to the injection cap. 6. Pull the syringe plunger back and withdraw 10 mL of blood. 7. Place the syringe into an appropriate waste container. 8. Scrub the injection cap for 15 seconds with a disinfecting wipe. 9. Attach the prefilled syringe to the injection cap. 10. Flush the catheter by pushing the plunger forward until all the liquid from the syringe is in the catheter. 11. Remove the syringe from the injection cap. 12. Clamp the catheter. If there is no heparin in your catheter: 1. Wash your hands with soap and water. 2. Put on gloves. 3. Scrub the injection cap for 15 seconds with a disinfecting wipe. 4. Unclamp the catheter. 5. Attach the prefilled syringe to the injection cap. 6. Flush the catheter by pushing the plunger forward until 5 mL of the liquid from the syringe is in the catheter. 7. Pull back on  the syringe until you see blood in the catheter. 8. If you have been asked to collect any blood, follow your health care provider's instructions. Otherwise, flush the catheter with the rest of the solution from the syringe. 9. Remove the syringe from the injection cap. 10. Clamp the catheter.  Flushing your catheter after use 1. Wash your hands with soap and water. 2. Put on gloves. 3. Scrub the injection cap for 15 seconds with a disinfecting wipe. 4. Unclamp the catheter. 5. Attach the prefilled syringe to the injection cap. 6. Flush the catheter by pushing the plunger forward until all of the liquid from the syringe is in the catheter. 7. Remove the syringe from the injection cap. 8. Clamp the catheter. Problems and solutions  If blood cannot be completely cleared from the injection cap, you may need to have the injection cap replaced.  If the catheter is difficult to flush, use the pulsing method. The pulsing method involves pushing only a few milliliters of solution into the catheter at a time and pausing between pushes.  If you do not see blood in the catheter when you pull back on the syringe, change your body position, such as by raising your arms above your head. Take a deep breath and cough. Then, pull back on the syringe. If you still do not see blood, flush the catheter with a small amount of solution. Then, change positions again and take a breath or cough. Pull back on the syringe again. If you still do not see   blood, finish flushing the catheter and contact your health care provider. Do not use your catheter until your health care provider says it is okay. General tips  Have someone help you flush your catheter, if possible.  Do not force fluid through your catheter.  Do not use a syringe that is larger or smaller than 10 mL. Using a smaller syringe can make the catheter burst.  Do not use your catheter without flushing it first if it has heparin in it. Contact a health  care provider if:  You cannot see any blood in the catheter when you flush it before using it.  Your catheter is difficult to flush. Get help right away if:  You cannot flush the catheter.  The catheter leaks when you flush it or when there is fluid in it.  There are cracks or breaks in the catheter. Summary  It is important to flush your tunneled central venous catheter each time you use it, both before and after you use it.  Scrub the injection cap for 15 seconds with a disinfecting wipe before and after you flush it.  When you flush your catheter, make sure you follow any specific instructions from your health care provider or the manufacturer.  Get help right away if you cannot flush the catheter. This information is not intended to replace advice given to you by your health care provider. Make sure you discuss any questions you have with your health care provider. Document Revised: 01/22/2019 Document Reviewed: 07/15/2018 Elsevier Patient Education  2020 [employer]vier Inc.  

## 2019-07-22 ENCOUNTER — Telehealth: Payer: Self-pay | Admitting: Hematology and Oncology

## 2019-07-22 NOTE — Telephone Encounter (Signed)
I talk with patient regarding schedule MD said to see patient with earlier month because he is not available on 3rd treatment

## 2019-07-31 ENCOUNTER — Other Ambulatory Visit: Payer: Self-pay | Admitting: Hematology and Oncology

## 2019-08-02 ENCOUNTER — Other Ambulatory Visit: Payer: Self-pay | Admitting: Hematology and Oncology

## 2019-08-09 ENCOUNTER — Other Ambulatory Visit: Payer: Self-pay | Admitting: Hematology and Oncology

## 2019-08-12 NOTE — Progress Notes (Signed)

## 2019-08-13 ENCOUNTER — Ambulatory Visit: Payer: Medicare Other | Attending: Internal Medicine

## 2019-08-13 DIAGNOSIS — Z23 Encounter for immunization: Secondary | ICD-10-CM

## 2019-08-13 NOTE — Progress Notes (Signed)
   Covid-19 Vaccination Clinic  Name:  [name]    MRN: [id number] DOB: July 11, 1965  08/13/2019  [name] was observed post Covid-19 immunization for 15 minutes without incident. She was provided with Vaccine Information Sheet and instruction to access the V-Safe system.   [name] was instructed to call 911 with any severe reactions post vaccine: Marland Kitchen Difficulty breathing  . Swelling of face and throat  . A fast heartbeat  . A bad rash all over body  . Dizziness and weakness   Immunizations Administered    Name Date Dose VIS Date Route   Moderna COVID-19 Vaccine 08/13/2019  9:44 AM 0.5 mL 04/13/2019 Intramuscular   Manufacturer: Moderna   Lot: [id number]   [id number]

## 2019-08-18 ENCOUNTER — Inpatient Hospital Stay: Payer: Medicare Other | Attending: Hematology and Oncology

## 2019-08-18 ENCOUNTER — Other Ambulatory Visit: Payer: Self-pay

## 2019-08-18 VITALS — BP 138/84 | HR 78 | Temp 98.5°F | Resp 18

## 2019-08-18 DIAGNOSIS — Z170 Estrogen receptor positive status [ER+]: Secondary | ICD-10-CM | POA: Insufficient documentation

## 2019-08-18 DIAGNOSIS — C50411 Malignant neoplasm of upper-outer quadrant of right female breast: Secondary | ICD-10-CM | POA: Insufficient documentation

## 2019-08-18 DIAGNOSIS — Z5112 Encounter for antineoplastic immunotherapy: Secondary | ICD-10-CM | POA: Diagnosis not present

## 2019-08-18 DIAGNOSIS — C7951 Secondary malignant neoplasm of bone: Secondary | ICD-10-CM | POA: Insufficient documentation

## 2019-08-18 DIAGNOSIS — Z7189 Other specified counseling: Secondary | ICD-10-CM

## 2019-08-18 DIAGNOSIS — Z9013 Acquired absence of bilateral breasts and nipples: Secondary | ICD-10-CM | POA: Insufficient documentation

## 2019-08-18 MED ORDER — SODIUM CHLORIDE 0.9 % IV SOLN
420.0000 mg | Freq: Once | INTRAVENOUS | Status: AC
  Administered 2019-08-18: 420 mg via INTRAVENOUS
  Filled 2019-08-18: qty 14

## 2019-08-18 MED ORDER — ACETAMINOPHEN 325 MG PO TABS
650.0000 mg | ORAL_TABLET | Freq: Once | ORAL | Status: AC
  Administered 2019-08-18: 650 mg via ORAL

## 2019-08-18 MED ORDER — TRASTUZUMAB-ANNS CHEMO 150 MG IV SOLR
6.0000 mg/kg | Freq: Once | INTRAVENOUS | Status: AC
  Administered 2019-08-18: 336 mg via INTRAVENOUS
  Filled 2019-08-18: qty 16

## 2019-08-18 MED ORDER — ACETAMINOPHEN 325 MG PO TABS
ORAL_TABLET | ORAL | Status: AC
  Filled 2019-08-18: qty 2

## 2019-08-18 MED ORDER — SODIUM CHLORIDE 0.9 % IV SOLN
Freq: Once | INTRAVENOUS | Status: AC
  Filled 2019-08-18: qty 250

## 2019-08-18 MED ORDER — DIPHENHYDRAMINE HCL 25 MG PO CAPS
50.0000 mg | ORAL_CAPSULE | Freq: Once | ORAL | Status: AC
  Administered 2019-08-18: 50 mg via ORAL

## 2019-08-18 MED ORDER — DIPHENHYDRAMINE HCL 25 MG PO CAPS
ORAL_CAPSULE | ORAL | Status: AC
  Filled 2019-08-18: qty 2

## 2019-08-18 MED ORDER — HEPARIN SOD (PORK) LOCK FLUSH 100 UNIT/ML IV SOLN
500.0000 [IU] | Freq: Once | INTRAVENOUS | Status: AC | PRN
  Administered 2019-08-18: 500 [IU]
  Filled 2019-08-18: qty 5

## 2019-08-18 MED ORDER — SODIUM CHLORIDE 0.9% FLUSH
10.0000 mL | INTRAVENOUS | Status: DC | PRN
  Administered 2019-08-18: 10 mL
  Filled 2019-08-18: qty 10

## 2019-08-18 NOTE — Patient Instructions (Signed)
[facility] Discharge Instructions for Patients Receiving Chemotherapy  Today you received the following chemotherapy agents:  Trastuzumab, Pertuzumab  To help prevent nausea and vomiting after your treatment, we encourage you to take your nausea medication as prescribed.   If you develop nausea and vomiting that is not controlled by your nausea medication, call the clinic.   BELOW ARE SYMPTOMS THAT SHOULD BE REPORTED IMMEDIATELY:  *FEVER GREATER THAN 100.5 F  *CHILLS WITH OR WITHOUT FEVER  NAUSEA AND VOMITING THAT IS NOT CONTROLLED WITH YOUR NAUSEA MEDICATION  *UNUSUAL SHORTNESS OF BREATH  *UNUSUAL BRUISING OR BLEEDING  TENDERNESS IN MOUTH AND THROAT WITH OR WITHOUT PRESENCE OF ULCERS  *URINARY PROBLEMS  *BOWEL PROBLEMS  UNUSUAL RASH Items with * indicate a potential emergency and should be followed up as soon as possible.  Feel free to call the clinic should you have any questions or concerns. The clinic phone number is [phone].  Please show the CHEMO ALERT CARD at check-in to the Emergency Department and triage nurse.   

## 2019-08-20 NOTE — Telephone Encounter (Signed)
Spoke with [facility]. They made several calls to patient. They spoke with caregiver Stefan Church and they did not want to schedule TCS until they spoke with oncology. FYI to [facility]

## 2019-08-22 ENCOUNTER — Other Ambulatory Visit: Payer: Self-pay | Admitting: Hematology and Oncology

## 2019-08-22 DIAGNOSIS — E785 Hyperlipidemia, unspecified: Secondary | ICD-10-CM

## 2019-08-27 NOTE — Telephone Encounter (Signed)
Called patient TO FOLLOW UP. GOING THROUGH BREAST CA TREATMENT. TOLERATING PRETTY WELL. FAVORITE AUNTIE  AND UNCLE PASSED [place] 2020.

## 2019-09-04 ENCOUNTER — Other Ambulatory Visit: Payer: Self-pay | Admitting: Oncology

## 2019-09-09 NOTE — Progress Notes (Signed)
Pharmacist Chemotherapy Monitoring - Follow Up Assessment    I verify that I have reviewed each item in the below checklist:  . Regimen for the patient is scheduled for the appropriate day and plan matches scheduled date. Marland Kitchen Appropriate non-routine labs are ordered dependent on drug ordered. . If applicable, additional medications reviewed and ordered per protocol based on lifetime cumulative doses and/or treatment regimen.   Plan for follow-up and/or issues identified: No . I-vent associated with next due treatment: No . MD and/or nursing notified: No  [name] K 09/09/2019 9:20 AM

## 2019-09-14 ENCOUNTER — Ambulatory Visit: Payer: Medicare Other

## 2019-09-14 NOTE — Progress Notes (Signed)
Patient Care Team: Janora Norlander, DO as PCP - General (Family Medicine) Danie Binder, MD (Gastroenterology) Alphonsa Overall, MD as Consulting Physician (General Surgery) Nicholas Lose, MD as Consulting Physician (Hematology and Oncology) Kyung Rudd, MD as Consulting Physician (Radiation Oncology) Mauro Kaufmann, RN as Oncology Nurse Navigator Rockwell Germany, RN as Oncology Nurse Navigator  DIAGNOSIS:    ICD-10-CM   1. Bone metastases (HCC)  C79.51   2. Malignant neoplasm of upper-outer quadrant of right breast in female, estrogen receptor positive ([place])  C50.411    Z17.0     SUMMARY OF ONCOLOGIC HISTORY: Oncology History  Malignant neoplasm of upper-outer quadrant of right breast in female, estrogen receptor positive ([place])  03/18/2018 Initial Diagnosis   Screening detected bilateral breast abnormalities.  Right breast mass UOQ 2.1 cm at 10 o'clock position: Grade 1 ILC ER 70%, PR 0%, HER-2 +3+ by IHC, Ki-67 5%, suspected lymph node in the axilla could not be biopsied; left breast calcifications by ultrasound not visible but 5 cm cystic/tubular structure which on biopsy was fibrocystic change with ADHl; T2N0 stage IIa clinical stage   05/01/2018 Surgery   Bilateral mastectomies: Left mastectomy: DCIS intermediate grade 0.9 cm margins negative Tis NX stage 0; right mastectomy: ILC, grade 3, 3.5 cm, with LCIS, perineural invasion present, margins negative, 15/22 lymph nodes positive with extracapsular extension, ER 90%, PR 0%, HER-2 +(IHC 3+), Ki-67 5%, T2N3 Stage 3B    05/11/2018 Cancer Staging   Staging form: Breast, AJCC 8th Edition - Pathologic stage from 05/11/2018: Stage IIIB (pT2, pN3, cM0, G3, ER+, PR-, HER2+) - Signed by Nicholas Lose, MD on 05/11/2018   06/03/2018 Imaging   Bone scan: Increased tracer uptake in the lumbar spine at L1, L3 and L4, right fourth and seventh ribs, inferior right scapula and distal sternum, T7 and T11 as well.   CT CAP showed widespread  bone metastases with possible pathologic fracture at L3, peripheral right upper lobe nodules nonspecific   06/10/2018 -  Chemotherapy   Taxol Kingenti and Perjeta    10/27/2018 PET scan   Bone metastases less metabolically active.  Couple of lymph nodes with low-level metabolic activity noted.     CHIEF COMPLIANT: Follow-up of Kingenti maintenance andanastrozole  INTERVAL HISTORY: [name] is a 54 y.o. with above-mentioned history of metastatic breast cancer currently on treatment withmaintenance Kingenti and on anti-estrogen therapy withanastrozole. She presents to the clinic today for treatment.   ALLERGIES:  is allergic to hydrocodone.  MEDICATIONS:  Current Outpatient Medications  Medication Sig Dispense Refill  . diphenoxylate-atropine (LOMOTIL) 2.5-0.025 MG tablet TAKE 1 TO 2 TABLETS BY MOUTH 4 TIMES A DAY AS NEEDED FOR DIARRHEA 40 tablet 1  . ibuprofen (ADVIL,MOTRIN) 600 MG tablet Take 1 tablet (600 mg total) by mouth every 6 (six) hours as needed (for mild pain not relieved by other medications.). 30 tablet 0  . lisinopril (ZESTRIL) 40 MG tablet Take 1 tablet (40 mg total) by mouth daily. 90 tablet 3  . megestrol (MEGACE) 40 MG tablet TAKE 3 TABLETS EVERY DAY 270 tablet 11  . megestrol (MEGACE) 40 MG tablet Take 1 tablet (40 mg total) by mouth 2 (two) times daily. Take 3 tablets every day 180 tablet 3  . ondansetron (ZOFRAN-ODT) 4 MG disintegrating tablet Take 1 tablet (4 mg total) by mouth every 6 (six) hours as needed for nausea. 15 tablet 0  . pantoprazole (PROTONIX) 40 MG tablet TAKE 1 TABLET BY MOUTH 30 MINUTES PRIOR TO  BREAKFAST AND SUPPER 180 tablet 3  . polyethylene glycol (MIRALAX / GLYCOLAX) packet Take 17 g by mouth daily as needed for moderate constipation.    . potassium chloride (KLOR-CON) 10 MEQ tablet TAKE 1 TABLET BY MOUTH EVERY DAY 30 tablet 1  . pravastatin (PRAVACHOL) 40 MG tablet TAKE 1 TABLET BY MOUTH DAILY 90 tablet 3  . tamoxifen (NOLVADEX) 20 MG  tablet TAKE 1 TABLET BY MOUTH EVERY DAY 90 tablet 3   No current facility-administered medications for this visit.   Facility-Administered Medications Ordered in Other Visits  Medication Dose Route Frequency Provider Last Rate Last Admin  . sodium chloride flush (NS) 0.9 % injection 10 mL  10 mL Intracatheter PRN Nicholas Lose, MD   10 mL at 09/15/19 1140    PHYSICAL EXAMINATION: ECOG PERFORMANCE STATUS: 1 - Symptomatic but completely ambulatory  Vitals:   09/15/19 0908  BP: 132/80  Pulse: 80  Resp: 18  Temp: 98.7 F (37.1 C)  SpO2: 99%   Filed Weights   09/15/19 0908  Weight: 134 lb 1.6 oz (60.8 kg)    LABORATORY DATA:  I have reviewed the data as listed CMP Latest Ref Rng & Units 07/21/2019 04/28/2019 03/31/2019  Glucose 70 - 99 mg/dL 134(H) 106(H) 118(H)  BUN 6 - 20 mg/dL 15 17 14   Creatinine 0.44 - 1.00 mg/dL 0.71 0.68 0.72  Sodium 135 - 145 mmol/L 141 142 141  Potassium 3.5 - 5.1 mmol/L 3.9 3.8 4.0  Chloride 98 - 111 mmol/L 109 110 108  CO2 22 - 32 mmol/L 25 22 23   Calcium 8.9 - 10.3 mg/dL 8.8(L) 8.9 8.8(L)  Total Protein 6.5 - 8.1 g/dL 7.3 7.4 7.4  Total Bilirubin 0.3 - 1.2 mg/dL 0.3 0.3 0.3  Alkaline Phos 38 - 126 U/L 65 63 61  AST 15 - 41 U/L 14(L) 14(L) 10(L)  ALT 0 - 44 U/L 15 13 12     Lab Results  Component Value Date   WBC 10.0 07/21/2019   HGB 12.4 07/21/2019   HCT 37.1 07/21/2019   MCV 92.8 07/21/2019   PLT 332 07/21/2019   NEUTROABS 6.9 07/21/2019    ASSESSMENT & PLAN:  Malignant neoplasm of upper-outer quadrant of right breast in female, estrogen receptor positive ([name]) 05/01/18:Bilateral mastectomies: Left mastectomy: DCIS intermediate grade 0.9 cm margins negative Tis NX stage 0; right mastectomy: ILC, grade 3, 3.5 cm, with LCIS, perineural invasion present, margins negative, 15/22 lymph nodes positive with extracapsular extension, ER 90%, PR 0%, HER-2 +(IHC 3+), Ki-67 5%, T2N3M1Stage IV  06/03/2018 bone scan: Increased tracer uptake in the  lumbar spine at L1, L3 and L4, right fourth and seventh ribs, inferior right scapula and distal sternum, T7 and T11 as well.  CT CAPshowed widespread bone metastases with possible pathologic fracture at L3, peripheral right upper lobe nodules nonspecific Patient has cerebral palsy and hersisteris the power of attorney for healthcare Treatment plan: Palliative treatment with Taxolx12 cycles completed 08/26/2018,Kingenti, Perjeta ------------------------------------------------------------------------------------------------------------------------ PET CT scan:10/27/2018:Widespread bone metastatic disease demonstrates reduced with sclerosis and low metabolic activity with SUV of 3-4. Left level 4 lymph node 0.5 cm and left deep parotid lymph node SUV 4.1 will need follow-up. Uterine leiomyomas.  Current treatment:Kingenti maintenance along with anastrozole  Anastrozole toxicities: Denies any hot flashes or myalgias. Kingenti toxicities: None Intermittent diarrhea  PET CT scan 04/26/2019: No significant residual hypermetabolic activity associated with the bone metastases. Interval enlargement and increased metabolic activity in the level 4 left cervical lymph node measuring 8 mm  was previously 5 mm with SUV 5.9.  Return to clinic every4 weeks for Kingenti and every 12 weeks for follow-up with me with labs Scans will be done in June.    No orders of the defined types were placed in this encounter.  The patient has a good understanding of the overall plan. she agrees with it. she will call with any problems that may develop before the next visit here.  Total time spent: 30 mins including face to face time and time spent for planning, charting and coordination of care  Nicholas Lose, MD 09/15/2019  I, Cloyde Reams Dorshimer, am acting as scribe for Dr. Nicholas Lose.  I have reviewed the above documentation for accuracy and completeness, and I agree with the above.

## 2019-09-15 ENCOUNTER — Ambulatory Visit: Payer: Medicare Other

## 2019-09-15 ENCOUNTER — Inpatient Hospital Stay: Payer: Medicare Other | Attending: Hematology and Oncology

## 2019-09-15 ENCOUNTER — Other Ambulatory Visit: Payer: Self-pay

## 2019-09-15 ENCOUNTER — Inpatient Hospital Stay (HOSPITAL_BASED_OUTPATIENT_CLINIC_OR_DEPARTMENT_OTHER): Payer: Medicare Other | Admitting: Hematology and Oncology

## 2019-09-15 VITALS — BP 132/80 | HR 80 | Temp 98.7°F | Resp 18 | Ht 62.0 in | Wt 134.1 lb

## 2019-09-15 DIAGNOSIS — C7951 Secondary malignant neoplasm of bone: Secondary | ICD-10-CM | POA: Diagnosis not present

## 2019-09-15 DIAGNOSIS — Z1501 Genetic susceptibility to malignant neoplasm of breast: Secondary | ICD-10-CM | POA: Insufficient documentation

## 2019-09-15 DIAGNOSIS — Z170 Estrogen receptor positive status [ER+]: Secondary | ICD-10-CM | POA: Diagnosis not present

## 2019-09-15 DIAGNOSIS — Z9013 Acquired absence of bilateral breasts and nipples: Secondary | ICD-10-CM | POA: Insufficient documentation

## 2019-09-15 DIAGNOSIS — C50411 Malignant neoplasm of upper-outer quadrant of right female breast: Secondary | ICD-10-CM | POA: Insufficient documentation

## 2019-09-15 DIAGNOSIS — G809 Cerebral palsy, unspecified: Secondary | ICD-10-CM | POA: Diagnosis not present

## 2019-09-15 DIAGNOSIS — Z791 Long term (current) use of non-steroidal anti-inflammatories (NSAID): Secondary | ICD-10-CM | POA: Insufficient documentation

## 2019-09-15 DIAGNOSIS — Z79899 Other long term (current) drug therapy: Secondary | ICD-10-CM | POA: Diagnosis not present

## 2019-09-15 DIAGNOSIS — Z5112 Encounter for antineoplastic immunotherapy: Secondary | ICD-10-CM | POA: Insufficient documentation

## 2019-09-15 DIAGNOSIS — Z7189 Other specified counseling: Secondary | ICD-10-CM

## 2019-09-15 MED ORDER — SODIUM CHLORIDE 0.9 % IV SOLN
Freq: Once | INTRAVENOUS | Status: AC
  Filled 2019-09-15: qty 250

## 2019-09-15 MED ORDER — TRASTUZUMAB-ANNS CHEMO 150 MG IV SOLR
6.0000 mg/kg | Freq: Once | INTRAVENOUS | Status: AC
  Administered 2019-09-15: 336 mg via INTRAVENOUS
  Filled 2019-09-15: qty 16

## 2019-09-15 MED ORDER — SODIUM CHLORIDE 0.9% FLUSH
10.0000 mL | INTRAVENOUS | Status: DC | PRN
  Administered 2019-09-15: 10 mL
  Filled 2019-09-15: qty 10

## 2019-09-15 MED ORDER — SODIUM CHLORIDE 0.9 % IV SOLN
420.0000 mg | Freq: Once | INTRAVENOUS | Status: AC
  Administered 2019-09-15: 420 mg via INTRAVENOUS
  Filled 2019-09-15: qty 14

## 2019-09-15 MED ORDER — HEPARIN SOD (PORK) LOCK FLUSH 100 UNIT/ML IV SOLN
500.0000 [IU] | Freq: Once | INTRAVENOUS | Status: AC | PRN
  Administered 2019-09-15: 500 [IU]
  Filled 2019-09-15: qty 5

## 2019-09-15 MED ORDER — DIPHENOXYLATE-ATROPINE 2.5-0.025 MG PO TABS: ORAL_TABLET | ORAL | 1 refills | Status: DC

## 2019-09-15 MED ORDER — ACETAMINOPHEN 325 MG PO TABS
650.0000 mg | ORAL_TABLET | Freq: Once | ORAL | Status: AC
  Administered 2019-09-15: 10:00:00 650 mg via ORAL

## 2019-09-15 MED ORDER — DIPHENHYDRAMINE HCL 25 MG PO CAPS
ORAL_CAPSULE | ORAL | Status: AC
  Filled 2019-09-15: qty 2

## 2019-09-15 MED ORDER — DIPHENHYDRAMINE HCL 25 MG PO CAPS
50.0000 mg | ORAL_CAPSULE | Freq: Once | ORAL | Status: AC
  Administered 2019-09-15: 50 mg via ORAL

## 2019-09-15 MED ORDER — ACETAMINOPHEN 325 MG PO TABS
ORAL_TABLET | ORAL | Status: AC
  Filled 2019-09-15: qty 2

## 2019-09-15 NOTE — Assessment & Plan Note (Signed)
05/01/18:Bilateral mastectomies: Left mastectomy: DCIS intermediate grade 0.9 cm margins negative Tis NX stage 0; right mastectomy: ILC, grade 3, 3.5 cm, with LCIS, perineural invasion present, margins negative, 15/22 lymph nodes positive with extracapsular extension, ER 90%, PR 0%, HER-2 +(IHC 3+), Ki-67 5%, T2N3M1Stage IV  06/03/2018 bone scan: Increased tracer uptake in the lumbar spine at L1, L3 and L4, right fourth and seventh ribs, inferior right scapula and distal sternum, T7 and T11 as well.  CT CAPshowed widespread bone metastases with possible pathologic fracture at L3, peripheral right upper lobe nodules nonspecific Patient has cerebral palsy and hersisteris the power of attorney for healthcare Treatment plan: Palliative treatment with Taxolx12 cycles completed 08/26/2018,Kingenti, Perjeta ------------------------------------------------------------------------------------------------------------------------ PET CT scan:10/27/2018:Widespread bone metastatic disease demonstrates reduced with sclerosis and low metabolic activity with SUV of 3-4. Left level 4 lymph node 0.5 cm and left deep parotid lymph node SUV 4.1 will need follow-up. Uterine leiomyomas.  Current treatment:Kingenti maintenance along with anastrozole  Anastrozole toxicities: Denies any hot flashes or myalgias. Kingenti toxicities: None Intermittent diarrhea  PET CT scan 04/26/2019: No significant residual hypermetabolic activity associated with the bone metastases. Interval enlargement and increased metabolic activity in the level 4 left cervical lymph node measuring 8 mm was previously 5 mm with SUV 5.9.  Return to clinic every4 weeks for Kingenti and every 12 weeks for follow-up with me with labs Scans will be done in June.

## 2019-09-15 NOTE — Patient Instructions (Signed)
[facility] Discharge Instructions for Patients Receiving Chemotherapy  Today you received the following chemotherapy agents kanjinti, perjeta   To help prevent nausea and vomiting after your treatment, we encourage you to take your nausea medication as directed  If you develop nausea and vomiting that is not controlled by your nausea medication, call the clinic.   BELOW ARE SYMPTOMS THAT SHOULD BE REPORTED IMMEDIATELY:  *FEVER GREATER THAN 100.5 F  *CHILLS WITH OR WITHOUT FEVER  NAUSEA AND VOMITING THAT IS NOT CONTROLLED WITH YOUR NAUSEA MEDICATION  *UNUSUAL SHORTNESS OF BREATH  *UNUSUAL BRUISING OR BLEEDING  TENDERNESS IN MOUTH AND THROAT WITH OR WITHOUT PRESENCE OF ULCERS  *URINARY PROBLEMS  *BOWEL PROBLEMS  UNUSUAL RASH Items with * indicate a potential emergency and should be followed up as soon as possible.  Feel free to call the clinic should you have any questions or concerns. The clinic phone number is [phone].  Please show the [place] at check-in to the Emergency Department and triage nurse.

## 2019-09-17 ENCOUNTER — Other Ambulatory Visit: Payer: Self-pay | Admitting: Family Medicine

## 2019-09-17 DIAGNOSIS — I10 Essential (primary) hypertension: Secondary | ICD-10-CM

## 2019-09-17 NOTE — Telephone Encounter (Signed)
Needs to be seen

## 2019-09-22 ENCOUNTER — Other Ambulatory Visit: Payer: Self-pay | Admitting: *Deleted

## 2019-09-22 DIAGNOSIS — I10 Essential (primary) hypertension: Secondary | ICD-10-CM

## 2019-09-22 MED ORDER — LISINOPRIL 40 MG PO TABS: 40.0000 mg | ORAL_TABLET | Freq: Every day | ORAL | 3 refills | Status: DC

## 2019-09-23 ENCOUNTER — Other Ambulatory Visit: Payer: Self-pay

## 2019-09-23 ENCOUNTER — Ambulatory Visit: Payer: Medicare Other | Attending: Internal Medicine

## 2019-09-23 DIAGNOSIS — Z23 Encounter for immunization: Secondary | ICD-10-CM

## 2019-09-23 NOTE — Progress Notes (Signed)
   Covid-19 Vaccination Clinic  Name:  [name]    MRN: [id number] DOB: 04-12-1966  09/23/2019  [name] was observed post Covid-19 immunization for 15 minutes without incident. She was provided with Vaccine Information Sheet and instruction to access the V-Safe system.   [name] was instructed to call 911 with any severe reactions post vaccine: Marland Kitchen Difficulty breathing  . Swelling of face and throat  . A fast heartbeat  . A bad rash all over body  . Dizziness and weakness   Immunizations Administered    Name Date Dose VIS Date Route   Moderna COVID-19 Vaccine 09/23/2019  9:33 AM 0.5 mL 04/2019 Intramuscular   Manufacturer: Moderna   Lot: [id number]   [id number]

## 2019-10-03 ENCOUNTER — Other Ambulatory Visit: Payer: Self-pay | Admitting: Oncology

## 2019-10-12 ENCOUNTER — Other Ambulatory Visit: Payer: Self-pay | Admitting: *Deleted

## 2019-10-12 DIAGNOSIS — Z170 Estrogen receptor positive status [ER+]: Secondary | ICD-10-CM

## 2019-10-13 ENCOUNTER — Inpatient Hospital Stay: Payer: Medicare Other

## 2019-10-13 ENCOUNTER — Inpatient Hospital Stay: Payer: Medicare Other | Attending: Hematology and Oncology

## 2019-10-13 ENCOUNTER — Other Ambulatory Visit: Payer: Self-pay

## 2019-10-13 ENCOUNTER — Other Ambulatory Visit: Payer: Self-pay | Admitting: *Deleted

## 2019-10-13 VITALS — BP 158/96 | HR 78 | Temp 98.7°F | Resp 18

## 2019-10-13 DIAGNOSIS — C50411 Malignant neoplasm of upper-outer quadrant of right female breast: Secondary | ICD-10-CM | POA: Insufficient documentation

## 2019-10-13 DIAGNOSIS — Z5112 Encounter for antineoplastic immunotherapy: Secondary | ICD-10-CM | POA: Diagnosis not present

## 2019-10-13 DIAGNOSIS — Z9013 Acquired absence of bilateral breasts and nipples: Secondary | ICD-10-CM | POA: Insufficient documentation

## 2019-10-13 DIAGNOSIS — Z79811 Long term (current) use of aromatase inhibitors: Secondary | ICD-10-CM | POA: Diagnosis not present

## 2019-10-13 DIAGNOSIS — Z7189 Other specified counseling: Secondary | ICD-10-CM

## 2019-10-13 DIAGNOSIS — Z5181 Encounter for therapeutic drug level monitoring: Secondary | ICD-10-CM

## 2019-10-13 DIAGNOSIS — C7951 Secondary malignant neoplasm of bone: Secondary | ICD-10-CM | POA: Diagnosis not present

## 2019-10-13 DIAGNOSIS — Z170 Estrogen receptor positive status [ER+]: Secondary | ICD-10-CM | POA: Diagnosis not present

## 2019-10-13 LAB — CMP (CANCER CENTER ONLY)
ALT: 19 U/L (ref 0–44)
AST: 13 U/L — ABNORMAL LOW (ref 15–41)
Albumin: 3.6 g/dL (ref 3.5–5.0)
Alkaline Phosphatase: 59 U/L (ref 38–126)
Anion gap: 9 (ref 5–15)
BUN: 17 mg/dL (ref 6–20)
CO2: 22 mmol/L (ref 22–32)
Calcium: 8.9 mg/dL (ref 8.9–10.3)
Chloride: 112 mmol/L — ABNORMAL HIGH (ref 98–111)
Creatinine: 0.7 mg/dL (ref 0.44–1.00)
GFR, Est AFR Am: >60 mL/min (ref >60)
GFR, Estimated: >60 mL/min (ref >60)
Glucose, Bld: 129 mg/dL — ABNORMAL HIGH (ref 70–99)
Potassium: 3.8 mmol/L (ref 3.5–5.1)
Sodium: 143 mmol/L (ref 135–145)
Total Bilirubin: 0.2 mg/dL — ABNORMAL LOW (ref 0.3–1.2)
Total Protein: 7.6 g/dL (ref 6.5–8.1)

## 2019-10-13 LAB — CBC WITH DIFFERENTIAL (CANCER CENTER ONLY)
Abs Immature Granulocytes: 0.03 10*3/uL (ref 0.00–0.07)
Basophils Absolute: 0 10*3/uL (ref 0.0–0.1)
Basophils Relative: 0 %
Eosinophils Absolute: 0.1 10*3/uL (ref 0.0–0.5)
Eosinophils Relative: 1 %
HCT: 36.8 % (ref 36.0–46.0)
Hemoglobin: 11.9 g/dL — ABNORMAL LOW (ref 12.0–15.0)
Immature Granulocytes: 0 %
Lymphocytes Relative: 25 %
Lymphs Abs: 2.3 10*3/uL (ref 0.7–4.0)
MCH: 30.2 pg (ref 26.0–34.0)
MCHC: 32.3 g/dL (ref 30.0–36.0)
MCV: 93.4 fL (ref 80.0–100.0)
Monocytes Absolute: 0.6 10*3/uL (ref 0.1–1.0)
Monocytes Relative: 6 %
Neutro Abs: 6.3 10*3/uL (ref 1.7–7.7)
Neutrophils Relative %: 68 %
Platelet Count: 329 10*3/uL (ref 150–400)
RBC: 3.94 MIL/uL (ref 3.87–5.11)
RDW: 13.3 % (ref 11.5–15.5)
WBC Count: 9.4 10*3/uL (ref 4.0–10.5)
nRBC: 0 % (ref 0.0–0.2)

## 2019-10-13 MED ORDER — ACETAMINOPHEN 325 MG PO TABS
ORAL_TABLET | ORAL | Status: AC
  Filled 2019-10-13: qty 2

## 2019-10-13 MED ORDER — SODIUM CHLORIDE 0.9% FLUSH
10.0000 mL | INTRAVENOUS | Status: DC | PRN
  Administered 2019-10-13: 10 mL
  Filled 2019-10-13: qty 10

## 2019-10-13 MED ORDER — DIPHENHYDRAMINE HCL 25 MG PO CAPS
50.0000 mg | ORAL_CAPSULE | Freq: Once | ORAL | Status: AC
  Administered 2019-10-13: 50 mg via ORAL

## 2019-10-13 MED ORDER — SODIUM CHLORIDE 0.9 % IV SOLN
420.0000 mg | Freq: Once | INTRAVENOUS | Status: AC
  Administered 2019-10-13: 420 mg via INTRAVENOUS
  Filled 2019-10-13: qty 14

## 2019-10-13 MED ORDER — DIPHENHYDRAMINE HCL 25 MG PO CAPS
ORAL_CAPSULE | ORAL | Status: AC
  Filled 2019-10-13: qty 2

## 2019-10-13 MED ORDER — ACETAMINOPHEN 325 MG PO TABS
650.0000 mg | ORAL_TABLET | Freq: Once | ORAL | Status: AC
  Administered 2019-10-13: 650 mg via ORAL

## 2019-10-13 MED ORDER — SODIUM CHLORIDE 0.9 % IV SOLN
Freq: Once | INTRAVENOUS | Status: AC
  Filled 2019-10-13: qty 250

## 2019-10-13 MED ORDER — TRASTUZUMAB-ANNS CHEMO 150 MG IV SOLR
6.0000 mg/kg | Freq: Once | INTRAVENOUS | Status: AC
  Administered 2019-10-13: 336 mg via INTRAVENOUS
  Filled 2019-10-13: qty 16

## 2019-10-13 MED ORDER — HEPARIN SOD (PORK) LOCK FLUSH 100 UNIT/ML IV SOLN
500.0000 [IU] | Freq: Once | INTRAVENOUS | Status: AC | PRN
  Administered 2019-10-13: 500 [IU]
  Filled 2019-10-13: qty 5

## 2019-10-13 NOTE — Patient Instructions (Signed)

## 2019-10-13 NOTE — Patient Instructions (Signed)
[facility] Discharge Instructions for Patients Receiving Chemotherapy  Today you received the following chemotherapy agents kanjinti, perjeta   To help prevent nausea and vomiting after your treatment, we encourage you to take your nausea medication as directed  If you develop nausea and vomiting that is not controlled by your nausea medication, call the clinic.   BELOW ARE SYMPTOMS THAT SHOULD BE REPORTED IMMEDIATELY:  *FEVER GREATER THAN 100.5 F  *CHILLS WITH OR WITHOUT FEVER  NAUSEA AND VOMITING THAT IS NOT CONTROLLED WITH YOUR NAUSEA MEDICATION  *UNUSUAL SHORTNESS OF BREATH  *UNUSUAL BRUISING OR BLEEDING  TENDERNESS IN MOUTH AND THROAT WITH OR WITHOUT PRESENCE OF ULCERS  *URINARY PROBLEMS  *BOWEL PROBLEMS  UNUSUAL RASH Items with * indicate a potential emergency and should be followed up as soon as possible.  Feel free to call the clinic should you have any questions or concerns. The clinic phone number is [phone].  Please show the [place] at check-in to the Emergency Department and triage nurse.

## 2019-10-27 ENCOUNTER — Other Ambulatory Visit: Payer: Self-pay

## 2019-10-27 ENCOUNTER — Ambulatory Visit (HOSPITAL_COMMUNITY)
Admission: RE | Admit: 2019-10-27 | Discharge: 2019-10-27 | Disposition: A | Discharge status: Home / Self Care | Payer: Medicare Other | Source: Ambulatory Visit | Attending: Hematology and Oncology | Admitting: Hematology and Oncology

## 2019-10-27 DIAGNOSIS — I10 Essential (primary) hypertension: Secondary | ICD-10-CM | POA: Insufficient documentation

## 2019-10-27 DIAGNOSIS — Z79899 Other long term (current) drug therapy: Secondary | ICD-10-CM | POA: Diagnosis not present

## 2019-10-27 DIAGNOSIS — K219 Gastro-esophageal reflux disease without esophagitis: Secondary | ICD-10-CM | POA: Diagnosis not present

## 2019-10-27 DIAGNOSIS — C50919 Malignant neoplasm of unspecified site of unspecified female breast: Secondary | ICD-10-CM | POA: Insufficient documentation

## 2019-10-27 DIAGNOSIS — E785 Hyperlipidemia, unspecified: Secondary | ICD-10-CM | POA: Insufficient documentation

## 2019-10-27 DIAGNOSIS — G809 Cerebral palsy, unspecified: Secondary | ICD-10-CM | POA: Insufficient documentation

## 2019-10-27 DIAGNOSIS — Z5181 Encounter for therapeutic drug level monitoring: Secondary | ICD-10-CM | POA: Insufficient documentation

## 2019-10-27 NOTE — Progress Notes (Signed)
  Echocardiogram 2D Echocardiogram with 3D has been performed.  [name] 10/27/2019, 9:58 AM

## 2019-10-29 ENCOUNTER — Other Ambulatory Visit: Payer: Self-pay | Admitting: Hematology and Oncology

## 2019-11-01 DIAGNOSIS — I10 Essential (primary) hypertension: Secondary | ICD-10-CM | POA: Diagnosis not present

## 2019-11-01 DIAGNOSIS — K219 Gastro-esophageal reflux disease without esophagitis: Secondary | ICD-10-CM | POA: Diagnosis not present

## 2019-11-01 DIAGNOSIS — G809 Cerebral palsy, unspecified: Secondary | ICD-10-CM | POA: Diagnosis not present

## 2019-11-01 DIAGNOSIS — Z1211 Encounter for screening for malignant neoplasm of colon: Secondary | ICD-10-CM | POA: Diagnosis not present

## 2019-11-01 DIAGNOSIS — K641 Second degree hemorrhoids: Secondary | ICD-10-CM | POA: Diagnosis not present

## 2019-11-01 DIAGNOSIS — Z86010 Personal history of colonic polyps: Secondary | ICD-10-CM | POA: Diagnosis not present

## 2019-11-01 DIAGNOSIS — Z853 Personal history of malignant neoplasm of breast: Secondary | ICD-10-CM | POA: Diagnosis not present

## 2019-11-01 DIAGNOSIS — K449 Diaphragmatic hernia without obstruction or gangrene: Secondary | ICD-10-CM | POA: Diagnosis not present

## 2019-11-03 ENCOUNTER — Other Ambulatory Visit: Payer: Self-pay | Admitting: *Deleted

## 2019-11-03 DIAGNOSIS — C770 Secondary and unspecified malignant neoplasm of lymph nodes of head, face and neck: Secondary | ICD-10-CM

## 2019-11-11 ENCOUNTER — Ambulatory Visit (HOSPITAL_COMMUNITY)
Admission: RE | Admit: 2019-11-11 | Discharge: 2019-11-11 | Disposition: A | Discharge status: Home / Self Care | Payer: Medicare Other | Source: Ambulatory Visit | Attending: Hematology and Oncology | Admitting: Hematology and Oncology

## 2019-11-11 ENCOUNTER — Other Ambulatory Visit: Payer: Self-pay

## 2019-11-11 DIAGNOSIS — M899 Disorder of bone, unspecified: Secondary | ICD-10-CM | POA: Diagnosis not present

## 2019-11-11 DIAGNOSIS — I700 Atherosclerosis of aorta: Secondary | ICD-10-CM | POA: Diagnosis not present

## 2019-11-11 DIAGNOSIS — C770 Secondary and unspecified malignant neoplasm of lymph nodes of head, face and neck: Secondary | ICD-10-CM | POA: Insufficient documentation

## 2019-11-11 DIAGNOSIS — C50919 Malignant neoplasm of unspecified site of unspecified female breast: Secondary | ICD-10-CM | POA: Diagnosis not present

## 2019-11-11 DIAGNOSIS — D259 Leiomyoma of uterus, unspecified: Secondary | ICD-10-CM | POA: Insufficient documentation

## 2019-11-11 DIAGNOSIS — J984 Other disorders of lung: Secondary | ICD-10-CM | POA: Diagnosis not present

## 2019-11-11 LAB — GLUCOSE, CAPILLARY: Glucose-Capillary: 139 mg/dL — ABNORMAL HIGH (ref 70–99)

## 2019-11-11 MED ORDER — FLUDEOXYGLUCOSE F - 18 (FDG) INJECTION: 6.6000 | Freq: Once | INTRAVENOUS | Status: DC | PRN

## 2019-11-25 ENCOUNTER — Other Ambulatory Visit: Payer: Self-pay | Admitting: Family Medicine

## 2019-11-25 DIAGNOSIS — K219 Gastro-esophageal reflux disease without esophagitis: Secondary | ICD-10-CM

## 2019-11-25 NOTE — Telephone Encounter (Signed)
Last seen by [place] 10/2018

## 2019-12-19 ENCOUNTER — Other Ambulatory Visit: Payer: Self-pay | Admitting: Hematology and Oncology

## 2020-01-21 ENCOUNTER — Telehealth: Payer: Self-pay | Admitting: Hematology and Oncology

## 2020-01-21 ENCOUNTER — Other Ambulatory Visit: Payer: Self-pay | Admitting: Hematology and Oncology

## 2020-01-21 NOTE — Telephone Encounter (Signed)
Scheduled appt per 9/10 sch msg - pt is aware of apt date and time

## 2020-02-09 ENCOUNTER — Other Ambulatory Visit: Payer: Self-pay | Admitting: *Deleted

## 2020-02-09 DIAGNOSIS — Z170 Estrogen receptor positive status [ER+]: Secondary | ICD-10-CM

## 2020-02-09 NOTE — Progress Notes (Signed)
Patient Care Team: Janora Norlander, DO as PCP - General (Family Medicine) Danie Binder, MD (Inactive) (Gastroenterology) Alphonsa Overall, MD as Consulting Physician (General Surgery) Nicholas Lose, MD as Consulting Physician (Hematology and Oncology) Kyung Rudd, MD as Consulting Physician (Radiation Oncology) Mauro Kaufmann, RN as Oncology Nurse Navigator Rockwell Germany, RN as Oncology Nurse Navigator  DIAGNOSIS:    ICD-10-CM   1. Malignant neoplasm of upper-outer quadrant of right breast in female, estrogen receptor positive ([place])  C50.411    Z17.0     SUMMARY OF ONCOLOGIC HISTORY: Oncology History  Malignant neoplasm of upper-outer quadrant of right breast in female, estrogen receptor positive ([place])  03/18/2018 Initial Diagnosis   Screening detected bilateral breast abnormalities.  Right breast mass UOQ 2.1 cm at 10 o'clock position: Grade 1 ILC ER 70%, PR 0%, HER-2 +3+ by IHC, Ki-67 5%, suspected lymph node in the axilla could not be biopsied; left breast calcifications by ultrasound not visible but 5 cm cystic/tubular structure which on biopsy was fibrocystic change with ADHl; T2N0 stage IIa clinical stage   05/01/2018 Surgery   Bilateral mastectomies: Left mastectomy: DCIS intermediate grade 0.9 cm margins negative Tis NX stage 0; right mastectomy: ILC, grade 3, 3.5 cm, with LCIS, perineural invasion present, margins negative, 15/22 lymph nodes positive with extracapsular extension, ER 90%, PR 0%, HER-2 +(IHC 3+), Ki-67 5%, T2N3 Stage 3B    05/11/2018 Cancer Staging   Staging form: Breast, AJCC 8th Edition - Pathologic stage from 05/11/2018: Stage IIIB (pT2, pN3, cM0, G3, ER+, PR-, HER2+) - Signed by Nicholas Lose, MD on 05/11/2018   06/03/2018 Imaging   Bone scan: Increased tracer uptake in the lumbar spine at L1, L3 and L4, right fourth and seventh ribs, inferior right scapula and distal sternum, T7 and T11 as well.   CT CAP showed widespread bone metastases with  possible pathologic fracture at L3, peripheral right upper lobe nodules nonspecific   06/10/2018 -  Chemotherapy   Taxol Kingenti and Perjeta    10/27/2018 PET scan   Bone metastases less metabolically active.  Couple of lymph nodes with low-level metabolic activity noted.     CHIEF COMPLIANT: Follow-up of Kingenti maintenance andanastrozole  INTERVAL HISTORY: [name] is a 54 y.o. with above-mentioned history of metastatic breast cancer currently on treatment withmaintenance Kingenti and on anti-estrogen therapy withanastrozole.Echo on 10/27/19 showed an ejection fraction of 55-60%. PET scan on 11/11/19 showed mild hypermetabolic activity at the left supraclavicular node and stable sclerotic lesions in the skeleton. She presents to the clinic todayfor treatment. She is doing quite well with regards to her health.  Denies any pain or discomfort.  ALLERGIES:  is allergic to hydrocodone.  MEDICATIONS:  Current Outpatient Medications  Medication Sig Dispense Refill  . diphenoxylate-atropine (LOMOTIL) 2.5-0.025 MG tablet TAKE 1 TO 2 TABLETS BY MOUTH 4 TIMES A DAY AS NEEDED FOR DIARRHEA 40 tablet 1  . ibuprofen (ADVIL,MOTRIN) 600 MG tablet Take 1 tablet (600 mg total) by mouth every 6 (six) hours as needed (for mild pain not relieved by other medications.). 30 tablet 0  . lisinopril (ZESTRIL) 40 MG tablet Take 1 tablet (40 mg total) by mouth daily. 90 tablet 3  . megestrol (MEGACE) 40 MG tablet TAKE 3 TABLETS EVERY DAY 270 tablet 11  . megestrol (MEGACE) 40 MG tablet TAKE 1 TABLET (40 MG TOTAL) BY MOUTH 2 (TWO) TIMES DAILY. TAKE 3 TABLETS EVERY DAY 180 tablet 3  . ondansetron (ZOFRAN-ODT) 4 MG disintegrating tablet  Take 1 tablet (4 mg total) by mouth every 6 (six) hours as needed for nausea. 15 tablet 0  . pantoprazole (PROTONIX) 40 MG tablet TAKE 1 TABLET BY MOUTH 30 MINUTES PRIOR TO BREAKFAST AND SUPPER 180 tablet 3  . polyethylene glycol (MIRALAX / GLYCOLAX) packet Take 17 g by mouth  daily as needed for moderate constipation.    . potassium chloride (KLOR-CON) 10 MEQ tablet TAKE 1 TABLET BY MOUTH EVERY DAY 30 tablet 1  . pravastatin (PRAVACHOL) 40 MG tablet TAKE 1 TABLET BY MOUTH DAILY 90 tablet 3  . tamoxifen (NOLVADEX) 20 MG tablet TAKE 1 TABLET BY MOUTH EVERY DAY 90 tablet 3   No current facility-administered medications for this visit.   Facility-Administered Medications Ordered in Other Visits  Medication Dose Route Frequency Provider Last Rate Last Admin  . sodium chloride flush (NS) 0.9 % injection 10 mL  10 mL Intracatheter PRN Nicholas Lose, MD   10 mL at 02/10/20 1052    PHYSICAL EXAMINATION: ECOG PERFORMANCE STATUS: 1 - Symptomatic but completely ambulatory  There were no vitals filed for this visit. There were no vitals filed for this visit.  LABORATORY DATA:  I have reviewed the data as listed CMP Latest Ref Rng & Units 10/13/2019 07/21/2019 04/28/2019  Glucose 70 - 99 mg/dL 129(H) 134(H) 106(H)  BUN 6 - 20 mg/dL _0 Creatinine 0.44 - 1.00 mg/dL 0.70 0.71 0.68  Sodium 135 - 145 mmol/L 143 141 142  Potassium 3.5 - 5.1 mmol/L 3.8 3.9 3.8  Chloride 98 - 111 mmol/L 112(H) 109 110  CO2 22 - 32 mmol/L _1 Calcium 8.9 - 10.3 mg/dL 8.9 8.8(L) 8.9  Total Protein 6.5 - 8.1 g/dL 7.6 7.3 7.4  Total Bilirubin 0.3 - 1.2 mg/dL 0.2(L) 0.3 0.3  Alkaline Phos 38 - 126 U/L 59 65 63  AST 15 - 41 U/L 13(L) 14(L) 14(L)  ALT 0 - 44 U/L _2 Lab Results  Component Value Date   WBC 9.3 02/10/2020   HGB 12.5 02/10/2020   HCT 37.9 02/10/2020   MCV 92.2 02/10/2020   PLT 317 02/10/2020   NEUTROABS 5.7 02/10/2020    ASSESSMENT & PLAN:  Malignant neoplasm of upper-outer quadrant of right breast in female, estrogen receptor positive ([place]) 05/01/18:Bilateral mastectomies: Left mastectomy: DCIS intermediate grade 0.9 cm margins negative Tis NX stage 0; right mastectomy: ILC, grade 3, 3.5 cm, with LCIS, perineural invasion present, margins negative,  15/22 lymph nodes positive with extracapsular extension, ER 90%, PR 0%, HER-2 +(IHC 3+), Ki-67 5%, T2N3M1Stage IV  06/03/2018 bone scan: Increased tracer uptake in the lumbar spine at L1, L3 and L4, right fourth and seventh ribs, inferior right scapula and distal sternum, T7 and T11 as well.  CT CAPshowed widespread bone metastases with possible pathologic fracture at L3, peripheral right upper lobe nodules nonspecific Patient has cerebral palsy and hersisteris the power of attorney for healthcare Treatment plan: Palliative treatment with Taxolx12 cycles completed 08/26/2018,Kingenti, Perjeta ------------------------------------------------------------------------------------------------------------------------ PET CT scan:10/27/2018:Widespread bone metastatic disease demonstrates reduced with sclerosis and low metabolic activity with SUV of 3-4. Left level 4 lymph node 0.5 cm and left deep parotid lymph node SUV 4.1 will need follow-up. Uterine leiomyomas.  Current treatment:Kingenti maintenance along with anastrozole  Anastrozole toxicities: Denies any hot flashes or myalgias. Kingenti toxicities: None Intermittent diarrhea  PET CT scan 04/26/2019: No significant residual hypermetabolic activity associated with the bone metastases. Interval enlargement and increased metabolic activity in  the level 4 left cervical lymph node measuring 8 mm was previously 5 mm with SUV 5.9. PET/CT 11/11/2019: Continued mild hypermetabolic activity left supraclavicular lymph node 0.7 cm, old sclerotic bone lesions are stable and not hypermetabolic.  Uterine fibroids  Return to clinic every6 weeks for Kingenti and follow-up     No orders of the defined types were placed in this encounter.  The patient has a good understanding of the overall plan. she agrees with it. she will call with any problems that may develop before the next visit here.  Total time spent: 30 mins including face to face  time and time spent for planning, charting and coordination of care  Nicholas Lose, MD 02/10/2020  I, Cloyde Reams Dorshimer, am acting as scribe for Dr. Nicholas Lose.  I have reviewed the above documentation for accuracy and completeness, and I agree with the above.

## 2020-02-10 ENCOUNTER — Inpatient Hospital Stay: Payer: Medicare Other

## 2020-02-10 ENCOUNTER — Other Ambulatory Visit: Payer: Self-pay

## 2020-02-10 ENCOUNTER — Inpatient Hospital Stay: Payer: Medicare Other | Attending: Hematology and Oncology | Admitting: Hematology and Oncology

## 2020-02-10 DIAGNOSIS — Z9221 Personal history of antineoplastic chemotherapy: Secondary | ICD-10-CM | POA: Diagnosis not present

## 2020-02-10 DIAGNOSIS — Z170 Estrogen receptor positive status [ER+]: Secondary | ICD-10-CM | POA: Insufficient documentation

## 2020-02-10 DIAGNOSIS — Z23 Encounter for immunization: Secondary | ICD-10-CM | POA: Insufficient documentation

## 2020-02-10 DIAGNOSIS — Z5112 Encounter for antineoplastic immunotherapy: Secondary | ICD-10-CM | POA: Diagnosis not present

## 2020-02-10 DIAGNOSIS — C7951 Secondary malignant neoplasm of bone: Secondary | ICD-10-CM | POA: Diagnosis not present

## 2020-02-10 DIAGNOSIS — Z9013 Acquired absence of bilateral breasts and nipples: Secondary | ICD-10-CM | POA: Diagnosis not present

## 2020-02-10 DIAGNOSIS — Z7189 Other specified counseling: Secondary | ICD-10-CM

## 2020-02-10 DIAGNOSIS — Z95828 Presence of other vascular implants and grafts: Secondary | ICD-10-CM

## 2020-02-10 DIAGNOSIS — C50411 Malignant neoplasm of upper-outer quadrant of right female breast: Secondary | ICD-10-CM | POA: Insufficient documentation

## 2020-02-10 DIAGNOSIS — Z79811 Long term (current) use of aromatase inhibitors: Secondary | ICD-10-CM | POA: Insufficient documentation

## 2020-02-10 LAB — CMP (CANCER CENTER ONLY)
ALT: 15 U/L (ref 0–44)
AST: 14 U/L — ABNORMAL LOW (ref 15–41)
Albumin: 3.8 g/dL (ref 3.5–5.0)
Alkaline Phosphatase: 64 U/L (ref 38–126)
Anion gap: 6 (ref 5–15)
BUN: 19 mg/dL (ref 6–20)
CO2: 25 mmol/L (ref 22–32)
Calcium: 9.4 mg/dL (ref 8.9–10.3)
Chloride: 109 mmol/L (ref 98–111)
Creatinine: 0.75 mg/dL (ref 0.44–1.00)
GFR, Est AFR Am: >60 mL/min (ref >60)
GFR, Estimated: >60 mL/min (ref >60)
Glucose, Bld: 123 mg/dL — ABNORMAL HIGH (ref 70–99)
Potassium: 4.1 mmol/L (ref 3.5–5.1)
Sodium: 140 mmol/L (ref 135–145)
Total Bilirubin: 0.3 mg/dL (ref 0.3–1.2)
Total Protein: 7.9 g/dL (ref 6.5–8.1)

## 2020-02-10 LAB — CBC WITH DIFFERENTIAL (CANCER CENTER ONLY)
Abs Immature Granulocytes: 0.03 10*3/uL (ref 0.00–0.07)
Basophils Absolute: 0.1 10*3/uL (ref 0.0–0.1)
Basophils Relative: 1 %
Eosinophils Absolute: 0.1 10*3/uL (ref 0.0–0.5)
Eosinophils Relative: 1 %
HCT: 37.9 % (ref 36.0–46.0)
Hemoglobin: 12.5 g/dL (ref 12.0–15.0)
Immature Granulocytes: 0 %
Lymphocytes Relative: 31 %
Lymphs Abs: 2.8 10*3/uL (ref 0.7–4.0)
MCH: 30.4 pg (ref 26.0–34.0)
MCHC: 33 g/dL (ref 30.0–36.0)
MCV: 92.2 fL (ref 80.0–100.0)
Monocytes Absolute: 0.6 10*3/uL (ref 0.1–1.0)
Monocytes Relative: 6 %
Neutro Abs: 5.7 10*3/uL (ref 1.7–7.7)
Neutrophils Relative %: 61 %
Platelet Count: 317 10*3/uL (ref 150–400)
RBC: 4.11 MIL/uL (ref 3.87–5.11)
RDW: 13.2 % (ref 11.5–15.5)
WBC Count: 9.3 10*3/uL (ref 4.0–10.5)
nRBC: 0 % (ref 0.0–0.2)

## 2020-02-10 MED ORDER — DIPHENHYDRAMINE HCL 25 MG PO CAPS
ORAL_CAPSULE | ORAL | Status: AC
  Filled 2020-02-10: qty 1

## 2020-02-10 MED ORDER — SODIUM CHLORIDE 0.9 % IV SOLN
Freq: Once | INTRAVENOUS | Status: AC
  Filled 2020-02-10: qty 250

## 2020-02-10 MED ORDER — ACETAMINOPHEN 325 MG PO TABS
650.0000 mg | ORAL_TABLET | Freq: Once | ORAL | Status: AC
  Administered 2020-02-10: 650 mg via ORAL

## 2020-02-10 MED ORDER — TRASTUZUMAB-ANNS CHEMO 150 MG IV SOLR
6.0000 mg/kg | Freq: Once | INTRAVENOUS | Status: AC
  Administered 2020-02-10: 336 mg via INTRAVENOUS
  Filled 2020-02-10: qty 16

## 2020-02-10 MED ORDER — HEPARIN SOD (PORK) LOCK FLUSH 100 UNIT/ML IV SOLN
500.0000 [IU] | Freq: Once | INTRAVENOUS | Status: AC | PRN
  Administered 2020-02-10: 500 [IU]
  Filled 2020-02-10: qty 5

## 2020-02-10 MED ORDER — INFLUENZA VAC SPLIT QUAD 0.5 ML IM SUSY
PREFILLED_SYRINGE | INTRAMUSCULAR | Status: AC
  Filled 2020-02-10: qty 0.5

## 2020-02-10 MED ORDER — ACETAMINOPHEN 325 MG PO TABS
ORAL_TABLET | ORAL | Status: AC
  Filled 2020-02-10: qty 2

## 2020-02-10 MED ORDER — SODIUM CHLORIDE 0.9% FLUSH
10.0000 mL | INTRAVENOUS | Status: DC | PRN
  Administered 2020-02-10: 10 mL
  Filled 2020-02-10: qty 10

## 2020-02-10 MED ORDER — DIPHENHYDRAMINE HCL 25 MG PO CAPS
25.0000 mg | ORAL_CAPSULE | Freq: Once | ORAL | Status: AC
  Administered 2020-02-10: 25 mg via ORAL

## 2020-02-10 MED ORDER — INFLUENZA VAC SPLIT QUAD 0.5 ML IM SUSY
0.5000 mL | PREFILLED_SYRINGE | Freq: Once | INTRAMUSCULAR | Status: AC
  Administered 2020-02-10: 0.5 mL via INTRAMUSCULAR

## 2020-02-10 NOTE — Assessment & Plan Note (Signed)
05/01/18:Bilateral mastectomies: Left mastectomy: DCIS intermediate grade 0.9 cm margins negative Tis NX stage 0; right mastectomy: ILC, grade 3, 3.5 cm, with LCIS, perineural invasion present, margins negative, 15/22 lymph nodes positive with extracapsular extension, ER 90%, PR 0%, HER-2 +(IHC 3+), Ki-67 5%, T2N3M1Stage IV  06/03/2018 bone scan: Increased tracer uptake in the lumbar spine at L1, L3 and L4, right fourth and seventh ribs, inferior right scapula and distal sternum, T7 and T11 as well.  CT CAPshowed widespread bone metastases with possible pathologic fracture at L3, peripheral right upper lobe nodules nonspecific Patient has cerebral palsy and hersisteris the power of attorney for healthcare Treatment plan: Palliative treatment with Taxolx12 cycles completed 08/26/2018,Kingenti, Perjeta ------------------------------------------------------------------------------------------------------------------------ PET CT scan:10/27/2018:Widespread bone metastatic disease demonstrates reduced with sclerosis and low metabolic activity with SUV of 3-4. Left level 4 lymph node 0.5 cm and left deep parotid lymph node SUV 4.1 will need follow-up. Uterine leiomyomas.  Current treatment:Kingenti maintenance along with anastrozole  Anastrozole toxicities: Denies any hot flashes or myalgias. Kingenti toxicities: None Intermittent diarrhea  PET CT scan 04/26/2019: No significant residual hypermetabolic activity associated with the bone metastases. Interval enlargement and increased metabolic activity in the level 4 left cervical lymph node measuring 8 mm was previously 5 mm with SUV 5.9. PET/CT 11/11/2019: Continued mild hypermetabolic activity left supraclavicular lymph node 0.7 cm, old sclerotic bone lesions are stable and not hypermetabolic.  Uterine fibroids  Return to clinic every4 weeks for Kingenti and every 12 weeks for follow-up with me with labs

## 2020-02-10 NOTE — Patient Instructions (Signed)
[facility] Discharge Instructions for Patients Receiving Chemotherapy  Today you received the following chemotherapy agents trastuzumab.  To help prevent nausea and vomiting after your treatment, we encourage you to take your nausea medication as directed.    If you develop nausea and vomiting that is not controlled by your nausea medication, call the clinic.   BELOW ARE SYMPTOMS THAT SHOULD BE REPORTED IMMEDIATELY:  *FEVER GREATER THAN 100.5 F  *CHILLS WITH OR WITHOUT FEVER  NAUSEA AND VOMITING THAT IS NOT CONTROLLED WITH YOUR NAUSEA MEDICATION  *UNUSUAL SHORTNESS OF BREATH  *UNUSUAL BRUISING OR BLEEDING  TENDERNESS IN MOUTH AND THROAT WITH OR WITHOUT PRESENCE OF ULCERS  *URINARY PROBLEMS  *BOWEL PROBLEMS  UNUSUAL RASH Items with * indicate a potential emergency and should be followed up as soon as possible.  Feel free to call the clinic should you have any questions or concerns. The clinic phone number is [phone].  Please show the CHEMO ALERT CARD at check-in to the Emergency Department and triage nurse.   

## 2020-02-11 ENCOUNTER — Telehealth: Payer: Self-pay | Admitting: Hematology and Oncology

## 2020-02-11 NOTE — Telephone Encounter (Signed)
Scheduled appts per 9/30 los. Pt's sister confirmed appt date and time.

## 2020-02-14 ENCOUNTER — Other Ambulatory Visit: Payer: Self-pay | Admitting: Hematology and Oncology

## 2020-03-08 ENCOUNTER — Other Ambulatory Visit: Payer: Self-pay | Admitting: Hematology and Oncology

## 2020-03-22 ENCOUNTER — Inpatient Hospital Stay: Payer: Medicare Other

## 2020-03-22 ENCOUNTER — Inpatient Hospital Stay: Payer: Medicare Other | Attending: Hematology and Oncology

## 2020-03-22 ENCOUNTER — Other Ambulatory Visit: Payer: Medicare Other

## 2020-03-22 ENCOUNTER — Other Ambulatory Visit: Payer: Self-pay

## 2020-03-22 ENCOUNTER — Ambulatory Visit: Payer: Medicare Other

## 2020-03-22 VITALS — BP 150/98 | HR 73 | Temp 98.7°F | Resp 18

## 2020-03-22 DIAGNOSIS — Z9012 Acquired absence of left breast and nipple: Secondary | ICD-10-CM | POA: Diagnosis not present

## 2020-03-22 DIAGNOSIS — Z5181 Encounter for therapeutic drug level monitoring: Secondary | ICD-10-CM

## 2020-03-22 DIAGNOSIS — Z5112 Encounter for antineoplastic immunotherapy: Secondary | ICD-10-CM | POA: Insufficient documentation

## 2020-03-22 DIAGNOSIS — C50411 Malignant neoplasm of upper-outer quadrant of right female breast: Secondary | ICD-10-CM | POA: Insufficient documentation

## 2020-03-22 DIAGNOSIS — Z170 Estrogen receptor positive status [ER+]: Secondary | ICD-10-CM | POA: Insufficient documentation

## 2020-03-22 DIAGNOSIS — Z7189 Other specified counseling: Secondary | ICD-10-CM

## 2020-03-22 DIAGNOSIS — C7951 Secondary malignant neoplasm of bone: Secondary | ICD-10-CM | POA: Diagnosis not present

## 2020-03-22 LAB — CMP (CANCER CENTER ONLY)
ALT: 11 U/L (ref 0–44)
AST: 12 U/L — ABNORMAL LOW (ref 15–41)
Albumin: 3.7 g/dL (ref 3.5–5.0)
Alkaline Phosphatase: 56 U/L (ref 38–126)
Anion gap: 10 (ref 5–15)
BUN: 13 mg/dL (ref 6–20)
CO2: 22 mmol/L (ref 22–32)
Calcium: 8.7 mg/dL — ABNORMAL LOW (ref 8.9–10.3)
Chloride: 110 mmol/L (ref 98–111)
Creatinine: 0.71 mg/dL (ref 0.44–1.00)
GFR, Estimated: >60 mL/min (ref >60)
Glucose, Bld: 123 mg/dL — ABNORMAL HIGH (ref 70–99)
Potassium: 3.8 mmol/L (ref 3.5–5.1)
Sodium: 142 mmol/L (ref 135–145)
Total Bilirubin: 0.4 mg/dL (ref 0.3–1.2)
Total Protein: 7.7 g/dL (ref 6.5–8.1)

## 2020-03-22 LAB — CBC WITH DIFFERENTIAL (CANCER CENTER ONLY)
Abs Immature Granulocytes: 0.04 10*3/uL (ref 0.00–0.07)
Basophils Absolute: 0.1 10*3/uL (ref 0.0–0.1)
Basophils Relative: 1 %
Eosinophils Absolute: 0.1 10*3/uL (ref 0.0–0.5)
Eosinophils Relative: 1 %
HCT: 36.1 % (ref 36.0–46.0)
Hemoglobin: 12.1 g/dL (ref 12.0–15.0)
Immature Granulocytes: 0 %
Lymphocytes Relative: 24 %
Lymphs Abs: 2.4 10*3/uL (ref 0.7–4.0)
MCH: 30.8 pg (ref 26.0–34.0)
MCHC: 33.5 g/dL (ref 30.0–36.0)
MCV: 91.9 fL (ref 80.0–100.0)
Monocytes Absolute: 0.5 10*3/uL (ref 0.1–1.0)
Monocytes Relative: 5 %
Neutro Abs: 6.9 10*3/uL (ref 1.7–7.7)
Neutrophils Relative %: 69 %
Platelet Count: 321 10*3/uL (ref 150–400)
RBC: 3.93 MIL/uL (ref 3.87–5.11)
RDW: 13 % (ref 11.5–15.5)
WBC Count: 9.9 10*3/uL (ref 4.0–10.5)
nRBC: 0 % (ref 0.0–0.2)

## 2020-03-22 MED ORDER — ACETAMINOPHEN 325 MG PO TABS
ORAL_TABLET | ORAL | Status: AC
  Filled 2020-03-22: qty 2

## 2020-03-22 MED ORDER — SODIUM CHLORIDE 0.9% FLUSH
10.0000 mL | INTRAVENOUS | Status: DC | PRN
  Administered 2020-03-22: 10 mL
  Filled 2020-03-22: qty 10

## 2020-03-22 MED ORDER — TRASTUZUMAB-ANNS CHEMO 150 MG IV SOLR
6.0000 mg/kg | Freq: Once | INTRAVENOUS | Status: AC
  Administered 2020-03-22: 336 mg via INTRAVENOUS
  Filled 2020-03-22: qty 16

## 2020-03-22 MED ORDER — ACETAMINOPHEN 325 MG PO TABS
650.0000 mg | ORAL_TABLET | Freq: Once | ORAL | Status: AC
  Administered 2020-03-22: 650 mg via ORAL

## 2020-03-22 MED ORDER — HEPARIN SOD (PORK) LOCK FLUSH 100 UNIT/ML IV SOLN
500.0000 [IU] | Freq: Once | INTRAVENOUS | Status: AC | PRN
  Administered 2020-03-22: 500 [IU]
  Filled 2020-03-22: qty 5

## 2020-03-22 MED ORDER — DIPHENHYDRAMINE HCL 25 MG PO CAPS
ORAL_CAPSULE | ORAL | Status: AC
  Filled 2020-03-22: qty 1

## 2020-03-22 MED ORDER — DIPHENHYDRAMINE HCL 25 MG PO CAPS
25.0000 mg | ORAL_CAPSULE | Freq: Once | ORAL | Status: AC
  Administered 2020-03-22: 25 mg via ORAL

## 2020-03-22 MED ORDER — SODIUM CHLORIDE 0.9 % IV SOLN
Freq: Once | INTRAVENOUS | Status: AC
  Filled 2020-03-22: qty 250

## 2020-03-22 NOTE — Patient Instructions (Signed)
[facility] Discharge Instructions for Patients Receiving Chemotherapy  Today you received the following chemotherapy agents trastuzumab.  To help prevent nausea and vomiting after your treatment, we encourage you to take your nausea medication as directed.    If you develop nausea and vomiting that is not controlled by your nausea medication, call the clinic.   BELOW ARE SYMPTOMS THAT SHOULD BE REPORTED IMMEDIATELY:  *FEVER GREATER THAN 100.5 F  *CHILLS WITH OR WITHOUT FEVER  NAUSEA AND VOMITING THAT IS NOT CONTROLLED WITH YOUR NAUSEA MEDICATION  *UNUSUAL SHORTNESS OF BREATH  *UNUSUAL BRUISING OR BLEEDING  TENDERNESS IN MOUTH AND THROAT WITH OR WITHOUT PRESENCE OF ULCERS  *URINARY PROBLEMS  *BOWEL PROBLEMS  UNUSUAL RASH Items with * indicate a potential emergency and should be followed up as soon as possible.  Feel free to call the clinic should you have any questions or concerns. The clinic phone number is [phone].  Please show the CHEMO ALERT CARD at check-in to the Emergency Department and triage nurse.   

## 2020-03-23 ENCOUNTER — Ambulatory Visit (HOSPITAL_COMMUNITY)
Admission: RE | Admit: 2020-03-23 | Discharge: 2020-03-23 | Disposition: A | Discharge status: Home / Self Care | Payer: Medicare Other | Source: Ambulatory Visit | Attending: Hematology and Oncology | Admitting: Hematology and Oncology

## 2020-03-23 DIAGNOSIS — E785 Hyperlipidemia, unspecified: Secondary | ICD-10-CM | POA: Diagnosis not present

## 2020-03-23 DIAGNOSIS — Z0189 Encounter for other specified special examinations: Secondary | ICD-10-CM | POA: Diagnosis not present

## 2020-03-23 DIAGNOSIS — I10 Essential (primary) hypertension: Secondary | ICD-10-CM | POA: Insufficient documentation

## 2020-03-23 DIAGNOSIS — Z5181 Encounter for therapeutic drug level monitoring: Secondary | ICD-10-CM | POA: Insufficient documentation

## 2020-03-23 DIAGNOSIS — Z79899 Other long term (current) drug therapy: Secondary | ICD-10-CM | POA: Insufficient documentation

## 2020-03-23 DIAGNOSIS — G809 Cerebral palsy, unspecified: Secondary | ICD-10-CM | POA: Diagnosis not present

## 2020-03-23 DIAGNOSIS — C50919 Malignant neoplasm of unspecified site of unspecified female breast: Secondary | ICD-10-CM | POA: Diagnosis not present

## 2020-03-23 DIAGNOSIS — Z9013 Acquired absence of bilateral breasts and nipples: Secondary | ICD-10-CM | POA: Diagnosis not present

## 2020-03-23 LAB — ECHOCARDIOGRAM COMPLETE
Area-P 1/2: 4.06 cm2
S' Lateral: 2.3 cm

## 2020-03-23 NOTE — Progress Notes (Signed)
  Echocardiogram 2D Echocardiogram has been performed.  [name] 03/23/2020, 9:42 AM

## 2020-04-01 ENCOUNTER — Other Ambulatory Visit: Payer: Self-pay | Admitting: Hematology and Oncology

## 2020-05-01 IMAGING — CT CT CHEST W/ CM
2 of 5 series · 13 of 36 positions shown, 16 images · IV contrast (OMNIPAQUE)
Comparison: CT abdomen 01/10/2006.

CLINICAL DATA: Bilateral breast cancer.

EXAM:
CT CHEST, ABDOMEN, AND PELVIS WITH CONTRAST
TECHNIQUE: Multidetector CT imaging of the chest, abdomen and pelvis was
performed following the standard protocol during bolus
administration of intravenous contrast.
CONTRAST:  80mL OMNIPAQUE IOHEXOL 300 MG/ML  SOLN

[Series 2: cap with · axial · 0.70mm/px · z∈[-666,-196]mm · 10 of 116 slices shown, 13 images]
[im 11/116  mediastinal]
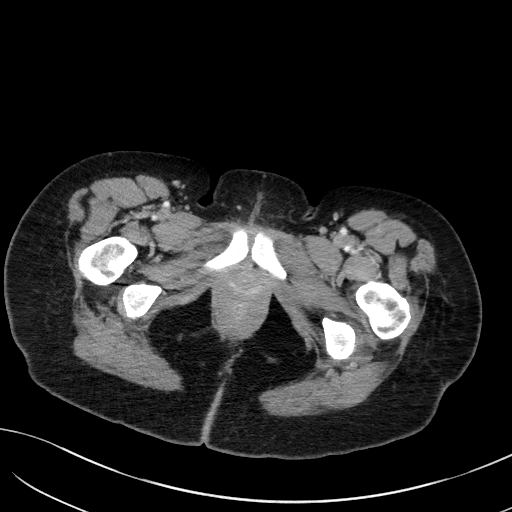
[im 11/116  lung]
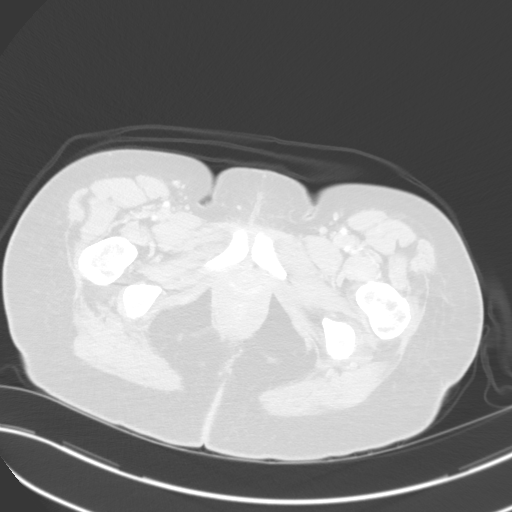
[im 21/116  lung]
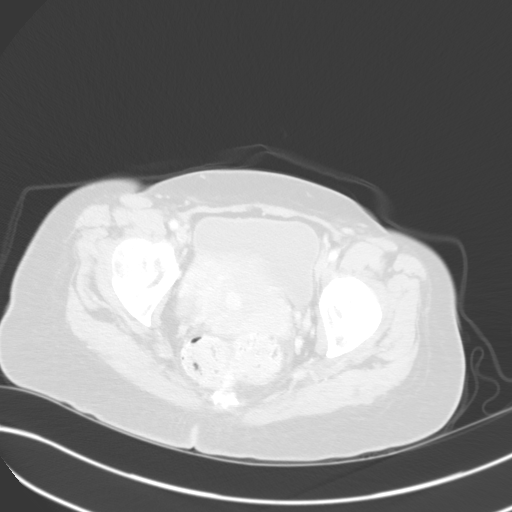
[im 32/116  lung]
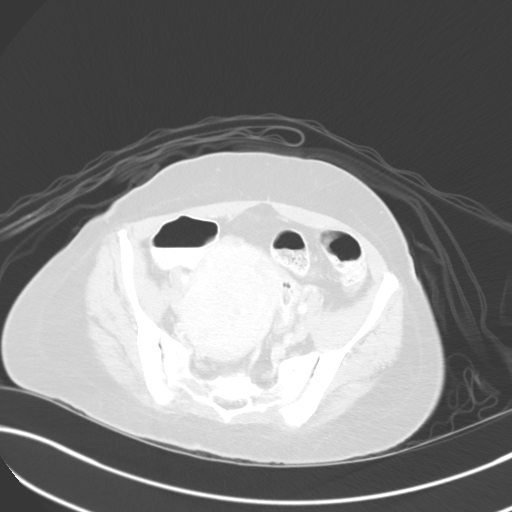
[im 42/116  lung]
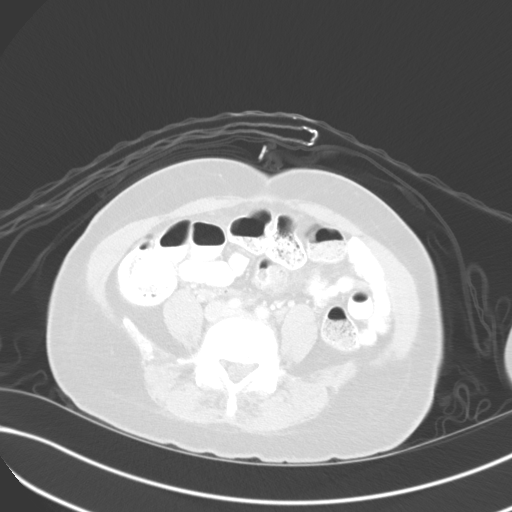
[im 53/116  mediastinal]
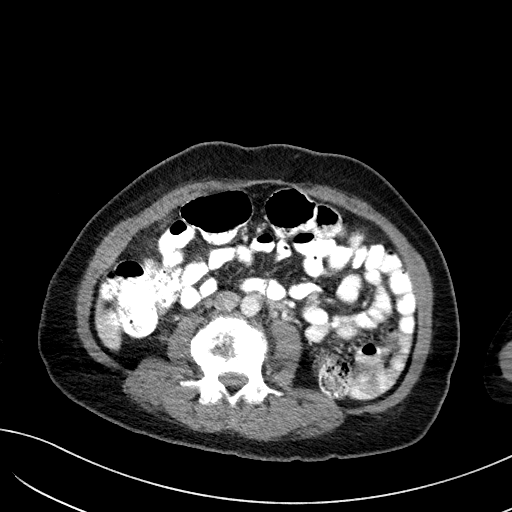
[im 53/116  lung]
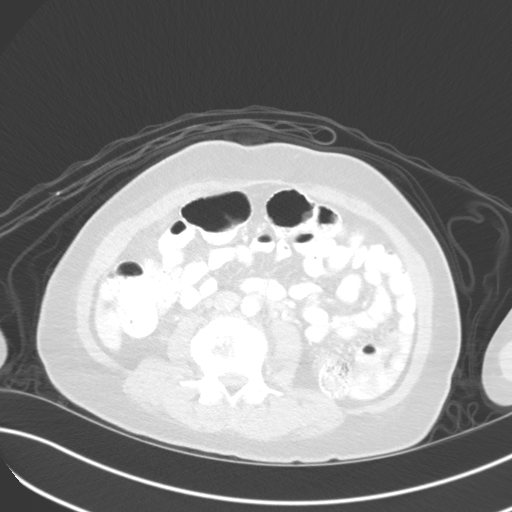
[im 63/116  lung]
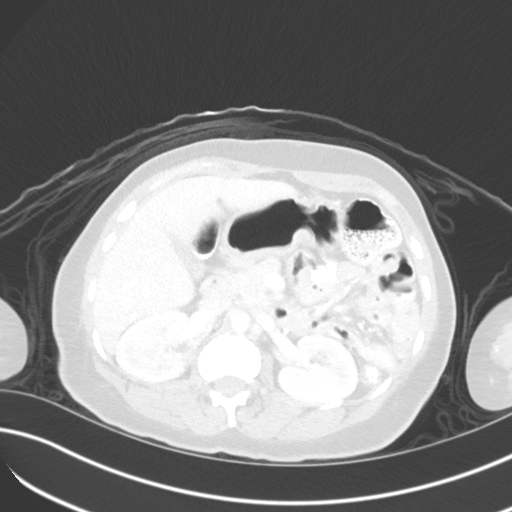
[im 74/116  lung]
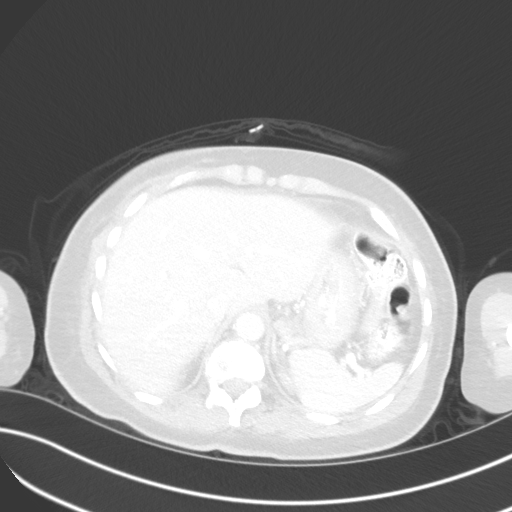
[im 84/116  lung]
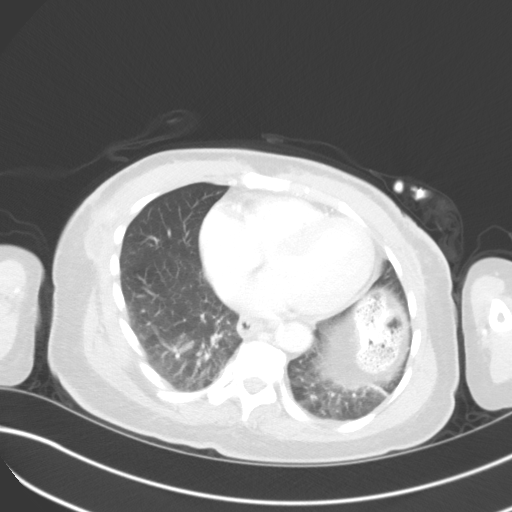
[im 95/116  mediastinal]
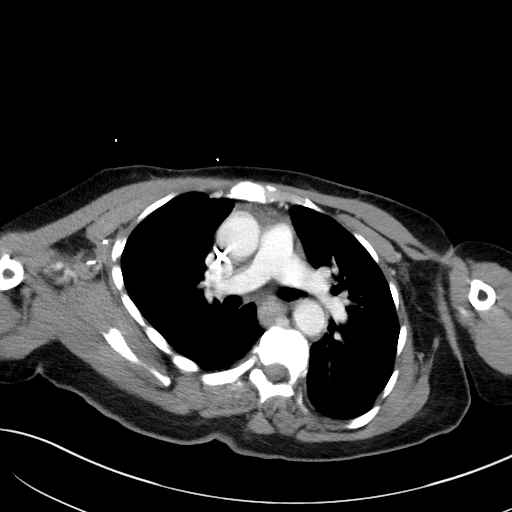
[im 95/116  lung]
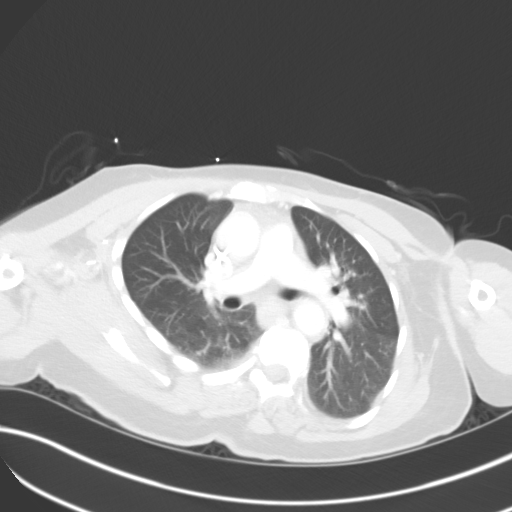
[im 105/116  lung]
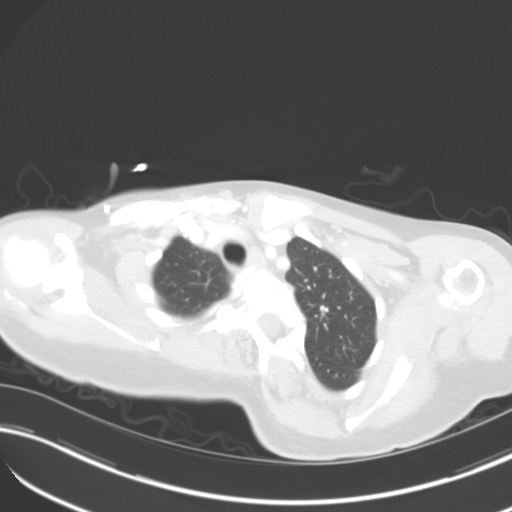

[Series 4: coronals · coronal · 0.82mm/px · 3 of 102 slices shown]
[im 21/102  lung]
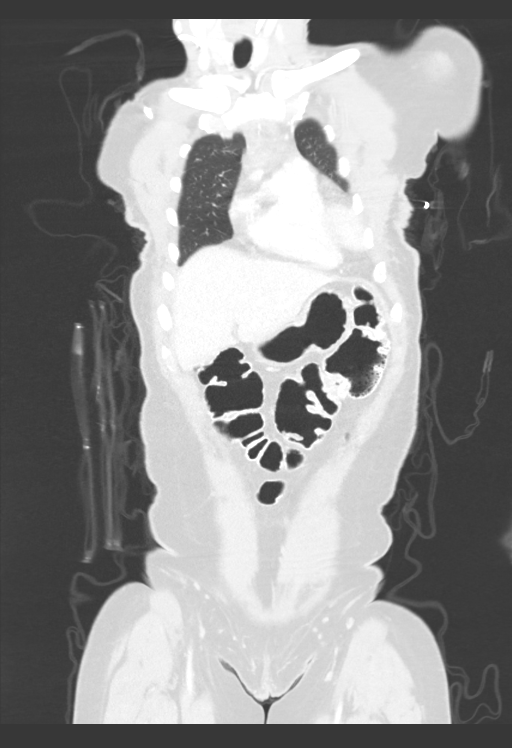
[im 41/102  lung]
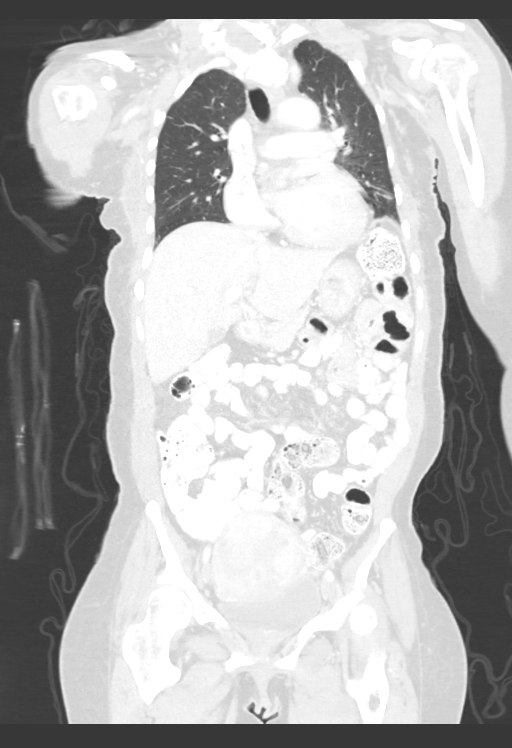
[im 61/102  lung]
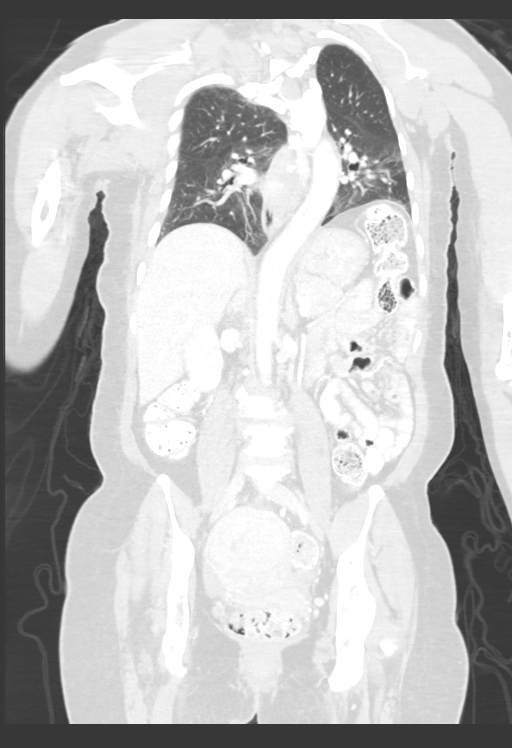

[13 of 36 positions shown; findings below may reference images not displayed]

FINDINGS: CT CHEST FINDINGS

Cardiovascular: Right IJ Port-A-Cath terminates in the high right
atrium. Vascular structures are unremarkable. Heart is enlarged. No
pericardial effusion.

Mediastinum/Nodes: No pathologically enlarged mediastinal, hilar or
axillary lymph nodes. Surgical clips in the right axilla with
adjacent fluid collections measuring 2.0 cm (series 2, image 18) and
2.8 cm (31), respectively. A thin collection of fluid in the left
lateral chest wall is seen as well (32). Right internal mammary
lymph nodes measure up to 5 mm. Esophagus is grossly unremarkable.

Lungs/Pleura: Image quality is degraded by expiratory phase imaging.
Peripheral right lung nodules measure up to 5 mm (series 6, image
40) and are nonspecific. Mild scarring or volume loss in the lingula
and left lower lobe. No pleural fluid. Airway is unremarkable.

Musculoskeletal: There sclerotic lesions throughout the visualized
osseous structures. Nodular areas of hyper attenuation are seen in
the lateral aspect of the right pectoralis musculature, measuring up
to 9 mm (series 2, image 32).

CT ABDOMEN PELVIS FINDINGS

Hepatobiliary: Liver and gallbladder are unremarkable. No biliary
ductal dilatation.

Pancreas: Negative.

Spleen: Negative.

Adrenals/Urinary Tract: Adrenal glands are unremarkable. 1.9 cm
low-attenuation lesion in the inferior right kidney is likely a
cyst. Subcentimeter low-attenuation lesion in the left kidney is too
small to characterize but statistically, a cyst is most likely as
well. Ureters are decompressed. Bladder is unremarkable.

Stomach/Bowel: Stomach, small bowel, appendix and colon are
unremarkable.

Vascular/Lymphatic: Vascular structures are unremarkable. No
pathologically enlarged lymph nodes.

Reproductive: Uterus is enlarged, heterogeneous and contains nodules
and masses measuring up to 6.9 cm, indicative of fibroids. No
adnexal mass.

Other: Trace pelvic free fluid. Mesenteries and peritoneum are
unremarkable. Small umbilical hernia contains fat.

Musculoskeletal: Sclerotic lesions are seen throughout the
visualized osseous structures. L3 anterior compression deformity is
new from lumbar spine series 06/26/2017 and is likely pathologic,
given lucency and sclerosis throughout the vertebral body.
IMPRESSION: 1. Widespread osseous metastatic disease with a probable pathologic
fracture of L3.
2. Subcentimeter right internal mammary lymph nodes.
Hyperattenuating nodules in the lateral right pectoralis musculature
are nonspecific and may be postoperative in etiology. Continued
attention on follow-up exams is warranted.
3. Postoperative changes of bilateral mastectomies with associated
fluid collections bilaterally, as described above.
4. Peripheral right upper lobe nodules are nonspecific and not
favored to be metastatic. Attention on follow-up recommended.
5. Enlarged, heterogeneous and fibroid uterus.

## 2020-05-02 NOTE — Progress Notes (Signed)
Patient Care Team: Janora Norlander, DO as PCP - General (Family Medicine) Danie Binder, MD (Inactive) (Gastroenterology) Alphonsa Overall, MD as Consulting Physician (General Surgery) Nicholas Lose, MD as Consulting Physician (Hematology and Oncology) Kyung Rudd, MD as Consulting Physician (Radiation Oncology) Mauro Kaufmann, RN as Oncology Nurse Navigator Rockwell Germany, RN as Oncology Nurse Navigator  DIAGNOSIS:    ICD-10-CM   1. Malignant neoplasm of upper-outer quadrant of right breast in female, estrogen receptor positive ([place])  C50.411    Z17.0     SUMMARY OF ONCOLOGIC HISTORY: Oncology History  Malignant neoplasm of upper-outer quadrant of right breast in female, estrogen receptor positive ([place])  03/18/2018 Initial Diagnosis   Screening detected bilateral breast abnormalities.  Right breast mass UOQ 2.1 cm at 10 o'clock position: Grade 1 ILC ER 70%, PR 0%, HER-2 +3+ by IHC, Ki-67 5%, suspected lymph node in the axilla could not be biopsied; left breast calcifications by ultrasound not visible but 5 cm cystic/tubular structure which on biopsy was fibrocystic change with ADHl; T2N0 stage IIa clinical stage   05/01/2018 Surgery   Bilateral mastectomies: Left mastectomy: DCIS intermediate grade 0.9 cm margins negative Tis NX stage 0; right mastectomy: ILC, grade 3, 3.5 cm, with LCIS, perineural invasion present, margins negative, 15/22 lymph nodes positive with extracapsular extension, ER 90%, PR 0%, HER-2 +(IHC 3+), Ki-67 5%, T2N3 Stage 3B    05/11/2018 Cancer Staging   Staging form: Breast, AJCC 8th Edition - Pathologic stage from 05/11/2018: Stage IIIB (pT2, pN3, cM0, G3, ER+, PR-, HER2+) - Signed by Nicholas Lose, MD on 05/11/2018   06/03/2018 Imaging   Bone scan: Increased tracer uptake in the lumbar spine at L1, L3 and L4, right fourth and seventh ribs, inferior right scapula and distal sternum, T7 and T11 as well.   CT CAP showed widespread bone metastases with  possible pathologic fracture at L3, peripheral right upper lobe nodules nonspecific   06/10/2018 -  Chemotherapy   Taxol Kingenti and Perjeta    10/27/2018 PET scan   Bone metastases less metabolically active.  Couple of lymph nodes with low-level metabolic activity noted.     CHIEF COMPLIANT: Follow-up of Kingenti maintenance andanastrozole  INTERVAL HISTORY: [name] is a 54 y.o. with above-mentioned history of metastatic breast cancer currently on treatment withmaintenance Kingenti and on anti-estrogen therapy withanastrozole.Echo on 03/23/20 showed an ejection fraction of 60-65%. She presents to the clinic todayfor treatment.  ALLERGIES:  is allergic to hydrocodone.  MEDICATIONS:  Current Outpatient Medications  Medication Sig Dispense Refill  . diphenoxylate-atropine (LOMOTIL) 2.5-0.025 MG tablet TAKE 1 TO 2 TABLETS BY MOUTH 4 TIMES A DAY AS NEEDED FOR DIARRHEA 40 tablet 1  . ibuprofen (ADVIL,MOTRIN) 600 MG tablet Take 1 tablet (600 mg total) by mouth every 6 (six) hours as needed (for mild pain not relieved by other medications.). 30 tablet 0  . lisinopril (ZESTRIL) 40 MG tablet Take 1 tablet (40 mg total) by mouth daily. 90 tablet 3  . megestrol (MEGACE) 40 MG tablet TAKE 3 TABLETS EVERY DAY 270 tablet 11  . megestrol (MEGACE) 40 MG tablet TAKE 1 TABLET (40 MG TOTAL) BY MOUTH 2 (TWO) TIMES DAILY. TAKE 3 TABLETS EVERY DAY 180 tablet 3  . ondansetron (ZOFRAN-ODT) 4 MG disintegrating tablet Take 1 tablet (4 mg total) by mouth every 6 (six) hours as needed for nausea. 15 tablet 0  . pantoprazole (PROTONIX) 40 MG tablet TAKE 1 TABLET BY MOUTH 30 MINUTES PRIOR TO BREAKFAST  AND SUPPER 180 tablet 3  . polyethylene glycol (MIRALAX / GLYCOLAX) packet Take 17 g by mouth daily as needed for moderate constipation.    . potassium chloride (KLOR-CON) 10 MEQ tablet TAKE 1 TABLET BY MOUTH EVERY DAY 30 tablet 1  . pravastatin (PRAVACHOL) 40 MG tablet TAKE 1 TABLET BY MOUTH DAILY 90 tablet 3   . tamoxifen (NOLVADEX) 20 MG tablet TAKE 1 TABLET BY MOUTH EVERY DAY 90 tablet 3   No current facility-administered medications for this visit.   Facility-Administered Medications Ordered in Other Visits  Medication Dose Route Frequency Provider Last Rate Last Admin  . sodium chloride flush (NS) 0.9 % injection 10 mL  10 mL Intracatheter PRN Nicholas Lose, MD   10 mL at 05/03/20 0847    PHYSICAL EXAMINATION: ECOG PERFORMANCE STATUS: 1 - Symptomatic but completely ambulatory  There were no vitals filed for this visit. There were no vitals filed for this visit.  LABORATORY DATA:  I have reviewed the data as listed CMP Latest Ref Rng & Units 03/22/2020 02/10/2020 10/13/2019  Glucose 70 - 99 mg/dL 123(H) 123(H) 129(H)  BUN 6 - 20 mg/dL 13 19 17   Creatinine 0.44 - 1.00 mg/dL 0.71 0.75 0.70  Sodium 135 - 145 mmol/L 142 140 143  Potassium 3.5 - 5.1 mmol/L 3.8 4.1 3.8  Chloride 98 - 111 mmol/L 110 109 112(H)  CO2 22 - 32 mmol/L 22 25 22   Calcium 8.9 - 10.3 mg/dL 8.7(L) 9.4 8.9  Total Protein 6.5 - 8.1 g/dL 7.7 7.9 7.6  Total Bilirubin 0.3 - 1.2 mg/dL 0.4 0.3 0.2(L)  Alkaline Phos 38 - 126 U/L 56 64 59  AST 15 - 41 U/L 12(L) 14(L) 13(L)  ALT 0 - 44 U/L 11 15 19     Lab Results  Component Value Date   WBC 7.6 05/03/2020   HGB 12.3 05/03/2020   HCT 37.1 05/03/2020   MCV 91.4 05/03/2020   PLT 321 05/03/2020   NEUTROABS 4.6 05/03/2020    ASSESSMENT & PLAN:  Malignant neoplasm of upper-outer quadrant of right breast in female, estrogen receptor positive ([place]) 05/01/18:Bilateral mastectomies: Left mastectomy: DCIS intermediate grade 0.9 cm margins negative Tis NX stage 0; right mastectomy: ILC, grade 3, 3.5 cm, with LCIS, perineural invasion present, margins negative, 15/22 lymph nodes positive with extracapsular extension, ER 90%, PR 0%, HER-2 +(IHC 3+), Ki-67 5%, T2N3M1Stage IV  06/03/2018 bone scan: Increased tracer uptake in the lumbar spine at L1, L3 and L4, right fourth and  seventh ribs, inferior right scapula and distal sternum, T7 and T11 as well.  CT CAPshowed widespread bone metastases with possible pathologic fracture at L3, peripheral right upper lobe nodules nonspecific Patient has cerebral palsy and hersisteris the power of attorney for healthcare Treatment plan: Palliative treatment with Taxolx12 cycles completed 08/26/2018,Kingenti, Perjeta ------------------------------------------------------------------------------------------------------------------------ PET CT scan:10/27/2018:Widespread bone metastatic disease demonstrates reduced with sclerosis and low metabolic activity with SUV of 3-4. Left level 4 lymph node 0.5 cm and left deep parotid lymph node SUV 4.1 will need follow-up. Uterine leiomyomas.  Current treatment:Kingenti maintenance along with anastrozole  Anastrozole toxicities: Denies any hot flashes or myalgias. Kingenti toxicities: None Intermittent diarrhea  PET/CT 11/11/2019: Continued mild hypermetabolic activity left supraclavicular lymph node 0.7 cm, old sclerotic bone lesions are stable and not hypermetabolic.  Uterine fibroids  Return to clinic every6 weeks for Kingenti and follow-up with me in 3 months after PET CT scan   No orders of the defined types were placed in this encounter.  The patient has a good understanding of the overall plan. she agrees with it. she will call with any problems that may develop before the next visit here.  Total time spent: 30 mins including face to face time and time spent for planning, charting and coordination of care  Nicholas Lose, MD 05/03/2020  I, Cloyde Reams Dorshimer, am acting as scribe for Dr. Nicholas Lose.  I have reviewed the above documentation for accuracy and completeness, and I agree with the above.

## 2020-05-03 ENCOUNTER — Inpatient Hospital Stay: Payer: Medicare Other | Attending: Hematology and Oncology

## 2020-05-03 ENCOUNTER — Other Ambulatory Visit: Payer: Medicare Other

## 2020-05-03 ENCOUNTER — Inpatient Hospital Stay (HOSPITAL_BASED_OUTPATIENT_CLINIC_OR_DEPARTMENT_OTHER): Payer: Medicare Other | Admitting: Hematology and Oncology

## 2020-05-03 ENCOUNTER — Inpatient Hospital Stay: Payer: Medicare Other

## 2020-05-03 ENCOUNTER — Ambulatory Visit: Payer: Medicare Other

## 2020-05-03 ENCOUNTER — Other Ambulatory Visit: Payer: Self-pay

## 2020-05-03 ENCOUNTER — Ambulatory Visit: Payer: Medicare Other | Admitting: Hematology and Oncology

## 2020-05-03 VITALS — BP 142/70 | HR 78 | Temp 99.1°F | Resp 18 | Ht 62.0 in | Wt 131.4 lb

## 2020-05-03 DIAGNOSIS — C770 Secondary and unspecified malignant neoplasm of lymph nodes of head, face and neck: Secondary | ICD-10-CM | POA: Diagnosis not present

## 2020-05-03 DIAGNOSIS — Z170 Estrogen receptor positive status [ER+]: Secondary | ICD-10-CM

## 2020-05-03 DIAGNOSIS — Z1501 Genetic susceptibility to malignant neoplasm of breast: Secondary | ICD-10-CM | POA: Diagnosis not present

## 2020-05-03 DIAGNOSIS — C50411 Malignant neoplasm of upper-outer quadrant of right female breast: Secondary | ICD-10-CM | POA: Insufficient documentation

## 2020-05-03 DIAGNOSIS — Z9013 Acquired absence of bilateral breasts and nipples: Secondary | ICD-10-CM | POA: Diagnosis not present

## 2020-05-03 DIAGNOSIS — Z79899 Other long term (current) drug therapy: Secondary | ICD-10-CM | POA: Diagnosis not present

## 2020-05-03 DIAGNOSIS — Z5112 Encounter for antineoplastic immunotherapy: Secondary | ICD-10-CM | POA: Insufficient documentation

## 2020-05-03 DIAGNOSIS — C7951 Secondary malignant neoplasm of bone: Secondary | ICD-10-CM | POA: Insufficient documentation

## 2020-05-03 DIAGNOSIS — Z95828 Presence of other vascular implants and grafts: Secondary | ICD-10-CM

## 2020-05-03 DIAGNOSIS — Z7189 Other specified counseling: Secondary | ICD-10-CM

## 2020-05-03 DIAGNOSIS — Z791 Long term (current) use of non-steroidal anti-inflammatories (NSAID): Secondary | ICD-10-CM | POA: Insufficient documentation

## 2020-05-03 LAB — CMP (CANCER CENTER ONLY)
ALT: 16 U/L (ref 0–44)
AST: 13 U/L — ABNORMAL LOW (ref 15–41)
Albumin: 3.7 g/dL (ref 3.5–5.0)
Alkaline Phosphatase: 63 U/L (ref 38–126)
Anion gap: 7 (ref 5–15)
BUN: 16 mg/dL (ref 6–20)
CO2: 24 mmol/L (ref 22–32)
Calcium: 9 mg/dL (ref 8.9–10.3)
Chloride: 112 mmol/L — ABNORMAL HIGH (ref 98–111)
Creatinine: 0.73 mg/dL (ref 0.44–1.00)
GFR, Estimated: >60 mL/min (ref >60)
Glucose, Bld: 139 mg/dL — ABNORMAL HIGH (ref 70–99)
Potassium: 3.7 mmol/L (ref 3.5–5.1)
Sodium: 143 mmol/L (ref 135–145)
Total Bilirubin: 0.3 mg/dL (ref 0.3–1.2)
Total Protein: 7.8 g/dL (ref 6.5–8.1)

## 2020-05-03 LAB — CBC WITH DIFFERENTIAL (CANCER CENTER ONLY)
Abs Immature Granulocytes: 0.01 10*3/uL (ref 0.00–0.07)
Basophils Absolute: 0 10*3/uL (ref 0.0–0.1)
Basophils Relative: 1 %
Eosinophils Absolute: 0.1 10*3/uL (ref 0.0–0.5)
Eosinophils Relative: 1 %
HCT: 37.1 % (ref 36.0–46.0)
Hemoglobin: 12.3 g/dL (ref 12.0–15.0)
Immature Granulocytes: 0 %
Lymphocytes Relative: 31 %
Lymphs Abs: 2.4 10*3/uL (ref 0.7–4.0)
MCH: 30.3 pg (ref 26.0–34.0)
MCHC: 33.2 g/dL (ref 30.0–36.0)
MCV: 91.4 fL (ref 80.0–100.0)
Monocytes Absolute: 0.5 10*3/uL (ref 0.1–1.0)
Monocytes Relative: 6 %
Neutro Abs: 4.6 10*3/uL (ref 1.7–7.7)
Neutrophils Relative %: 61 %
Platelet Count: 321 10*3/uL (ref 150–400)
RBC: 4.06 MIL/uL (ref 3.87–5.11)
RDW: 13 % (ref 11.5–15.5)
WBC Count: 7.6 10*3/uL (ref 4.0–10.5)
nRBC: 0 % (ref 0.0–0.2)

## 2020-05-03 MED ORDER — SODIUM CHLORIDE 0.9% FLUSH
10.0000 mL | INTRAVENOUS | Status: DC | PRN
  Administered 2020-05-03: 09:00:00 10 mL
  Filled 2020-05-03: qty 10

## 2020-05-03 MED ORDER — ACETAMINOPHEN 325 MG PO TABS
ORAL_TABLET | ORAL | Status: AC
  Filled 2020-05-03: qty 2

## 2020-05-03 MED ORDER — TRASTUZUMAB-ANNS CHEMO 150 MG IV SOLR
6.0000 mg/kg | Freq: Once | INTRAVENOUS | Status: AC
  Administered 2020-05-03: 11:00:00 336 mg via INTRAVENOUS
  Filled 2020-05-03: qty 16

## 2020-05-03 MED ORDER — DIPHENHYDRAMINE HCL 25 MG PO CAPS
ORAL_CAPSULE | ORAL | Status: AC
  Filled 2020-05-03: qty 1

## 2020-05-03 MED ORDER — ACETAMINOPHEN 325 MG PO TABS
650.0000 mg | ORAL_TABLET | Freq: Once | ORAL | Status: AC
  Administered 2020-05-03: 10:00:00 650 mg via ORAL

## 2020-05-03 MED ORDER — SODIUM CHLORIDE 0.9 % IV SOLN
Freq: Once | INTRAVENOUS | Status: AC
  Filled 2020-05-03: qty 250

## 2020-05-03 MED ORDER — DIPHENHYDRAMINE HCL 25 MG PO CAPS
25.0000 mg | ORAL_CAPSULE | Freq: Once | ORAL | Status: AC
  Administered 2020-05-03: 10:00:00 25 mg via ORAL

## 2020-05-03 MED ORDER — HEPARIN SOD (PORK) LOCK FLUSH 100 UNIT/ML IV SOLN
500.0000 [IU] | Freq: Once | INTRAVENOUS | Status: AC | PRN
  Administered 2020-05-03: 11:00:00 500 [IU]
  Filled 2020-05-03: qty 5

## 2020-05-03 MED ORDER — SODIUM CHLORIDE 0.9% FLUSH
10.0000 mL | INTRAVENOUS | Status: DC | PRN
  Administered 2020-05-03: 11:00:00 10 mL
  Filled 2020-05-03: qty 10

## 2020-05-03 NOTE — Patient Instructions (Signed)
[facility] Discharge Instructions for Patients Receiving Chemotherapy  Today you received the following chemotherapy agents trastuzumab.  To help prevent nausea and vomiting after your treatment, we encourage you to take your nausea medication as directed.    If you develop nausea and vomiting that is not controlled by your nausea medication, call the clinic.   BELOW ARE SYMPTOMS THAT SHOULD BE REPORTED IMMEDIATELY:  *FEVER GREATER THAN 100.5 F  *CHILLS WITH OR WITHOUT FEVER  NAUSEA AND VOMITING THAT IS NOT CONTROLLED WITH YOUR NAUSEA MEDICATION  *UNUSUAL SHORTNESS OF BREATH  *UNUSUAL BRUISING OR BLEEDING  TENDERNESS IN MOUTH AND THROAT WITH OR WITHOUT PRESENCE OF ULCERS  *URINARY PROBLEMS  *BOWEL PROBLEMS  UNUSUAL RASH Items with * indicate a potential emergency and should be followed up as soon as possible.  Feel free to call the clinic should you have any questions or concerns. The clinic phone number is [phone].  Please show the CHEMO ALERT CARD at check-in to the Emergency Department and triage nurse.   

## 2020-05-03 NOTE — Assessment & Plan Note (Signed)
05/01/18:Bilateral mastectomies: Left mastectomy: DCIS intermediate grade 0.9 cm margins negative Tis NX stage 0; right mastectomy: ILC, grade 3, 3.5 cm, with LCIS, perineural invasion present, margins negative, 15/22 lymph nodes positive with extracapsular extension, ER 90%, PR 0%, HER-2 +(IHC 3+), Ki-67 5%, T2N3M1Stage IV  06/03/2018 bone scan: Increased tracer uptake in the lumbar spine at L1, L3 and L4, right fourth and seventh ribs, inferior right scapula and distal sternum, T7 and T11 as well.  CT CAPshowed widespread bone metastases with possible pathologic fracture at L3, peripheral right upper lobe nodules nonspecific Patient has cerebral palsy and hersisteris the power of attorney for healthcare Treatment plan: Palliative treatment with Taxolx12 cycles completed 08/26/2018,Kingenti, Perjeta ------------------------------------------------------------------------------------------------------------------------ PET CT scan:10/27/2018:Widespread bone metastatic disease demonstrates reduced with sclerosis and low metabolic activity with SUV of 3-4. Left level 4 lymph node 0.5 cm and left deep parotid lymph node SUV 4.1 will need follow-up. Uterine leiomyomas.  Current treatment:Kingenti maintenance along with anastrozole  Anastrozole toxicities: Denies any hot flashes or myalgias. Kingenti toxicities: None Intermittent diarrhea  PET CT scan 04/26/2019: No significant residual hypermetabolic activity associated with the bone metastases. Interval enlargement and increased metabolic activity in the level 4 left cervical lymph node measuring 8 mm was previously 5 mm with SUV 5.9. PET/CT 11/11/2019: Continued mild hypermetabolic activity left supraclavicular lymph node 0.7 cm, old sclerotic bone lesions are stable and not hypermetabolic.  Uterine fibroids  Return to clinic every6 weeks for Kingenti and follow-up

## 2020-05-03 NOTE — Patient Instructions (Signed)

## 2020-05-04 ENCOUNTER — Other Ambulatory Visit: Payer: Self-pay | Admitting: Hematology and Oncology

## 2020-05-08 ENCOUNTER — Telehealth: Payer: Self-pay | Admitting: Hematology and Oncology

## 2020-05-08 NOTE — Telephone Encounter (Signed)
Scheduled per 12/22 los. Pt will receive an updated appt calendar per next visit appt notes

## 2020-05-30 ENCOUNTER — Other Ambulatory Visit: Payer: Self-pay | Admitting: Hematology and Oncology

## 2020-06-13 ENCOUNTER — Other Ambulatory Visit: Payer: Self-pay

## 2020-06-13 DIAGNOSIS — C50411 Malignant neoplasm of upper-outer quadrant of right female breast: Secondary | ICD-10-CM

## 2020-06-14 ENCOUNTER — Other Ambulatory Visit: Payer: Self-pay

## 2020-06-14 ENCOUNTER — Other Ambulatory Visit: Payer: Medicare Other

## 2020-06-14 ENCOUNTER — Inpatient Hospital Stay: Payer: Medicare Other

## 2020-06-14 ENCOUNTER — Inpatient Hospital Stay: Payer: Medicare Other | Attending: Hematology and Oncology

## 2020-06-14 ENCOUNTER — Ambulatory Visit: Payer: Medicare Other

## 2020-06-14 VITALS — BP 138/76 | HR 86 | Temp 98.5°F | Resp 18 | Ht 62.0 in | Wt 131.3 lb

## 2020-06-14 DIAGNOSIS — Z170 Estrogen receptor positive status [ER+]: Secondary | ICD-10-CM

## 2020-06-14 DIAGNOSIS — Z5112 Encounter for antineoplastic immunotherapy: Secondary | ICD-10-CM | POA: Diagnosis not present

## 2020-06-14 DIAGNOSIS — C50411 Malignant neoplasm of upper-outer quadrant of right female breast: Secondary | ICD-10-CM | POA: Insufficient documentation

## 2020-06-14 DIAGNOSIS — C770 Secondary and unspecified malignant neoplasm of lymph nodes of head, face and neck: Secondary | ICD-10-CM | POA: Diagnosis not present

## 2020-06-14 DIAGNOSIS — Z95828 Presence of other vascular implants and grafts: Secondary | ICD-10-CM

## 2020-06-14 DIAGNOSIS — Z7189 Other specified counseling: Secondary | ICD-10-CM

## 2020-06-14 LAB — CMP (CANCER CENTER ONLY)
ALT: 15 U/L (ref 0–44)
AST: 14 U/L — ABNORMAL LOW (ref 15–41)
Albumin: 3.7 g/dL (ref 3.5–5.0)
Alkaline Phosphatase: 65 U/L (ref 38–126)
Anion gap: 6 (ref 5–15)
BUN: 16 mg/dL (ref 6–20)
CO2: 24 mmol/L (ref 22–32)
Calcium: 9.1 mg/dL (ref 8.9–10.3)
Chloride: 110 mmol/L (ref 98–111)
Creatinine: 0.76 mg/dL (ref 0.44–1.00)
GFR, Estimated: >60 mL/min (ref >60)
Glucose, Bld: 130 mg/dL — ABNORMAL HIGH (ref 70–99)
Potassium: 3.9 mmol/L (ref 3.5–5.1)
Sodium: 140 mmol/L (ref 135–145)
Total Bilirubin: 0.4 mg/dL (ref 0.3–1.2)
Total Protein: 7.7 g/dL (ref 6.5–8.1)

## 2020-06-14 LAB — CBC WITH DIFFERENTIAL (CANCER CENTER ONLY)
Abs Immature Granulocytes: 0.03 10*3/uL (ref 0.00–0.07)
Basophils Absolute: 0.1 10*3/uL (ref 0.0–0.1)
Basophils Relative: 1 %
Eosinophils Absolute: 0.1 10*3/uL (ref 0.0–0.5)
Eosinophils Relative: 1 %
HCT: 36.4 % (ref 36.0–46.0)
Hemoglobin: 12.1 g/dL (ref 12.0–15.0)
Immature Granulocytes: 0 %
Lymphocytes Relative: 27 %
Lymphs Abs: 2.9 10*3/uL (ref 0.7–4.0)
MCH: 30.3 pg (ref 26.0–34.0)
MCHC: 33.2 g/dL (ref 30.0–36.0)
MCV: 91.2 fL (ref 80.0–100.0)
Monocytes Absolute: 0.7 10*3/uL (ref 0.1–1.0)
Monocytes Relative: 6 %
Neutro Abs: 7.1 10*3/uL (ref 1.7–7.7)
Neutrophils Relative %: 65 %
Platelet Count: 324 10*3/uL (ref 150–400)
RBC: 3.99 MIL/uL (ref 3.87–5.11)
RDW: 13.2 % (ref 11.5–15.5)
WBC Count: 10.8 10*3/uL — ABNORMAL HIGH (ref 4.0–10.5)
nRBC: 0 % (ref 0.0–0.2)

## 2020-06-14 MED ORDER — SODIUM CHLORIDE 0.9% FLUSH
10.0000 mL | INTRAVENOUS | Status: DC | PRN
  Administered 2020-06-14: 10 mL
  Filled 2020-06-14: qty 10

## 2020-06-14 MED ORDER — HEPARIN SOD (PORK) LOCK FLUSH 100 UNIT/ML IV SOLN
500.0000 [IU] | Freq: Once | INTRAVENOUS | Status: AC | PRN
  Administered 2020-06-14: 500 [IU]
  Filled 2020-06-14: qty 5

## 2020-06-14 MED ORDER — DIPHENHYDRAMINE HCL 25 MG PO CAPS
25.0000 mg | ORAL_CAPSULE | Freq: Once | ORAL | Status: AC
  Administered 2020-06-14: 25 mg via ORAL

## 2020-06-14 MED ORDER — DIPHENHYDRAMINE HCL 25 MG PO CAPS
ORAL_CAPSULE | ORAL | Status: AC
  Filled 2020-06-14: qty 1

## 2020-06-14 MED ORDER — SODIUM CHLORIDE 0.9 % IV SOLN
Freq: Once | INTRAVENOUS | Status: AC
  Filled 2020-06-14: qty 250

## 2020-06-14 MED ORDER — ACETAMINOPHEN 325 MG PO TABS
ORAL_TABLET | ORAL | Status: AC
  Filled 2020-06-14: qty 2

## 2020-06-14 MED ORDER — TRASTUZUMAB-ANNS CHEMO 150 MG IV SOLR
6.0000 mg/kg | Freq: Once | INTRAVENOUS | Status: AC
  Administered 2020-06-14: 336 mg via INTRAVENOUS
  Filled 2020-06-14: qty 16

## 2020-06-14 MED ORDER — ACETAMINOPHEN 325 MG PO TABS
650.0000 mg | ORAL_TABLET | Freq: Once | ORAL | Status: AC
  Administered 2020-06-14: 650 mg via ORAL

## 2020-06-14 NOTE — Patient Instructions (Signed)
Implanted Port Insertion, Care After This sheet gives you information about how to care for yourself after your procedure. Your health care provider may also give you more specific instructions. If you have problems or questions, contact your health care provider. What can I expect after the procedure? After the procedure, it is common to have:  Discomfort at the port insertion site.  Bruising on the skin over the port. This should improve over 3-4 days. Follow these instructions at home: Port care  After your port is placed, you will get a manufacturer's information card. The card has information about your port. Keep this card with you at all times.  Take care of the port as told by your health care provider. Ask your health care provider if you or a family member can get training for taking care of the port at home. A home health care nurse may also take care of the port.  Make sure to remember what type of port you have. Incision care  Follow instructions from your health care provider about how to take care of your port insertion site. Make sure you: ? Wash your hands with soap and water before and after you change your bandage (dressing). If soap and water are not available, use hand sanitizer. ? Change your dressing as told by your health care provider. ? Leave stitches (sutures), skin glue, or adhesive strips in place. These skin closures may need to stay in place for 2 weeks or longer. If adhesive strip edges start to loosen and curl up, you may trim the loose edges. Do not remove adhesive strips completely unless your health care provider tells you to do that.  Check your port insertion site every day for signs of infection. Check for: ? Redness, swelling, or pain. ? Fluid or blood. ? Warmth. ? Pus or a bad smell.      Activity  Return to your normal activities as told by your health care provider. Ask your health care provider what activities are safe for you.  Do not  lift anything that is heavier than 10 lb (4.5 kg), or the limit that you are told, until your health care provider says that it is safe. General instructions  Take over-the-counter and prescription medicines only as told by your health care provider.  Do not take baths, swim, or use a hot tub until your health care provider approves. Ask your health care provider if you may take showers. You may only be allowed to take sponge baths.  Do not drive for 24 hours if you were given a sedative during your procedure.  Wear a medical alert bracelet in case of an emergency. This will tell any health care providers that you have a port.  Keep all follow-up visits as told by your health care provider. This is important. Contact a health care provider if:  You cannot flush your port with saline as directed, or you cannot draw blood from the port.  You have a fever or chills.  You have redness, swelling, or pain around your port insertion site.  You have fluid or blood coming from your port insertion site.  Your port insertion site feels warm to the touch.  You have pus or a bad smell coming from the port insertion site. Get help right away if:  You have chest pain or shortness of breath.  You have bleeding from your port that you cannot control. Summary  Take care of the port as told by your   health care provider. Keep the manufacturer's information card with you at all times.  Change your dressing as told by your health care provider.  Contact a health care provider if you have a fever or chills or if you have redness, swelling, or pain around your port insertion site.  Keep all follow-up visits as told by your health care provider. This information is not intended to replace advice given to you by your health care provider. Make sure you discuss any questions you have with your health care provider. Document Revised: 11/25/2017 Document Reviewed: 11/25/2017 Elsevier Patient Education   2021 Elsevier Inc.  

## 2020-06-14 NOTE — Patient Instructions (Signed)
[facility] Discharge Instructions for Patients Receiving Chemotherapy  Today you received the following chemotherapy agents Trastuzumab-anns (KANJINTI).  To help prevent nausea and vomiting after your treatment, we encourage you to take your nausea medication as prescribed.   If you develop nausea and vomiting that is not controlled by your nausea medication, call the clinic.   BELOW ARE SYMPTOMS THAT SHOULD BE REPORTED IMMEDIATELY:  *FEVER GREATER THAN 100.5 F  *CHILLS WITH OR WITHOUT FEVER  NAUSEA AND VOMITING THAT IS NOT CONTROLLED WITH YOUR NAUSEA MEDICATION  *UNUSUAL SHORTNESS OF BREATH  *UNUSUAL BRUISING OR BLEEDING  TENDERNESS IN MOUTH AND THROAT WITH OR WITHOUT PRESENCE OF ULCERS  *URINARY PROBLEMS  *BOWEL PROBLEMS  UNUSUAL RASH Items with * indicate a potential emergency and should be followed up as soon as possible.  Feel free to call the clinic should you have any questions or concerns. The clinic phone number is [phone].  Please show the CHEMO ALERT CARD at check-in to the Emergency Department and triage nurse.   

## 2020-06-22 ENCOUNTER — Other Ambulatory Visit: Payer: Self-pay | Admitting: Hematology and Oncology

## 2020-07-17 ENCOUNTER — Other Ambulatory Visit: Payer: Self-pay | Admitting: Hematology and Oncology

## 2020-07-25 NOTE — Progress Notes (Signed)
Patient Care Team: Janora Norlander, DO as PCP - General (Family Medicine) Danie Binder, MD (Inactive) (Gastroenterology) Alphonsa Overall, MD as Consulting Physician (General Surgery) Nicholas Lose, MD as Consulting Physician (Hematology and Oncology) Kyung Rudd, MD as Consulting Physician (Radiation Oncology) Mauro Kaufmann, RN as Oncology Nurse Navigator Rockwell Germany, RN as Oncology Nurse Navigator  DIAGNOSIS:    ICD-10-CM   1. Malignant neoplasm of upper-outer quadrant of right breast in female, estrogen receptor positive ([place])  C50.411    Z17.0     SUMMARY OF ONCOLOGIC HISTORY: Oncology History  Malignant neoplasm of upper-outer quadrant of right breast in female, estrogen receptor positive ([place])  03/18/2018 Initial Diagnosis   Screening detected bilateral breast abnormalities.  Right breast mass UOQ 2.1 cm at 10 o'clock position: Grade 1 ILC ER 70%, PR 0%, HER-2 +3+ by IHC, Ki-67 5%, suspected lymph node in the axilla could not be biopsied; left breast calcifications by ultrasound not visible but 5 cm cystic/tubular structure which on biopsy was fibrocystic change with ADHl; T2N0 stage IIa clinical stage   05/01/2018 Surgery   Bilateral mastectomies: Left mastectomy: DCIS intermediate grade 0.9 cm margins negative Tis NX stage 0; right mastectomy: ILC, grade 3, 3.5 cm, with LCIS, perineural invasion present, margins negative, 15/22 lymph nodes positive with extracapsular extension, ER 90%, PR 0%, HER-2 +(IHC 3+), Ki-67 5%, T2N3 Stage 3B    05/11/2018 Cancer Staging   Staging form: Breast, AJCC 8th Edition - Pathologic stage from 05/11/2018: Stage IIIB (pT2, pN3, cM0, G3, ER+, PR-, HER2+) - Signed by Nicholas Lose, MD on 05/11/2018   06/03/2018 Imaging   Bone scan: Increased tracer uptake in the lumbar spine at L1, L3 and L4, right fourth and seventh ribs, inferior right scapula and distal sternum, T7 and T11 as well.   CT CAP showed widespread bone metastases with  possible pathologic fracture at L3, peripheral right upper lobe nodules nonspecific   06/10/2018 -  Chemotherapy   Taxol Kingenti and Perjeta    10/27/2018 PET scan   Bone metastases less metabolically active.  Couple of lymph nodes with low-level metabolic activity noted.     CHIEF COMPLIANT: Follow-up of Kingenti maintenance andanastrozole  INTERVAL HISTORY: [name] is a 55 y.o. with above-mentioned history of metastatic breast cancer currently on treatment withmaintenance Kingenti and on anti-estrogen therapy withanastrozole. She presents to the clinic todayfor treatment.  ALLERGIES:  is allergic to hydrocodone.  MEDICATIONS:  Current Outpatient Medications  Medication Sig Dispense Refill   diphenoxylate-atropine (LOMOTIL) 2.5-0.025 MG tablet TAKE 1 TO 2 TABLETS BY MOUTH 4 TIMES A DAY AS NEEDED FOR DIARRHEA 40 tablet 1   ibuprofen (ADVIL,MOTRIN) 600 MG tablet Take 1 tablet (600 mg total) by mouth every 6 (six) hours as needed (for mild pain not relieved by other medications.). 30 tablet 0   lisinopril (ZESTRIL) 40 MG tablet Take 1 tablet (40 mg total) by mouth daily. 90 tablet 3   megestrol (MEGACE) 40 MG tablet TAKE 3 TABLETS EVERY DAY 270 tablet 11   megestrol (MEGACE) 40 MG tablet TAKE 1 TABLET (40 MG TOTAL) BY MOUTH 2 (TWO) TIMES DAILY. TAKE 3 TABLETS EVERY DAY 180 tablet 3   ondansetron (ZOFRAN-ODT) 4 MG disintegrating tablet Take 1 tablet (4 mg total) by mouth every 6 (six) hours as needed for nausea. 15 tablet 0   pantoprazole (PROTONIX) 40 MG tablet TAKE 1 TABLET BY MOUTH 30 MINUTES PRIOR TO BREAKFAST AND SUPPER 180 tablet 3   polyethylene  glycol (MIRALAX / GLYCOLAX) packet Take 17 g by mouth daily as needed for moderate constipation.     potassium chloride (KLOR-CON) 10 MEQ tablet TAKE 1 TABLET BY MOUTH EVERY DAY 30 tablet 1   pravastatin (PRAVACHOL) 40 MG tablet TAKE 1 TABLET BY MOUTH DAILY 90 tablet 3   tamoxifen (NOLVADEX) 20 MG tablet TAKE 1 TABLET BY  MOUTH EVERY DAY 90 tablet 3   No current facility-administered medications for this visit.    PHYSICAL EXAMINATION: ECOG PERFORMANCE STATUS: 1 - Symptomatic but completely ambulatory  Vitals:   07/26/20 0922  BP: (!) 162/90  Pulse: 89  Resp: 18  Temp: 98.2 F (36.8 C)  SpO2: 100%   Filed Weights   07/26/20 0922  Weight: 129 lb 8 oz (58.7 kg)    LABORATORY DATA:  I have reviewed the data as listed CMP Latest Ref Rng & Units 06/14/2020 05/03/2020 03/22/2020  Glucose 70 - 99 mg/dL 130(H) 139(H) 123(H)  BUN 6 - 20 mg/dL _0 Creatinine 0.44 - 1.00 mg/dL 0.76 0.73 0.71  Sodium 135 - 145 mmol/L 140 143 142  Potassium 3.5 - 5.1 mmol/L 3.9 3.7 3.8  Chloride 98 - 111 mmol/L 110 112(H) 110  CO2 22 - 32 mmol/L _1 Calcium 8.9 - 10.3 mg/dL 9.1 9.0 8.7(L)  Total Protein 6.5 - 8.1 g/dL 7.7 7.8 7.7  Total Bilirubin 0.3 - 1.2 mg/dL 0.4 0.3 0.4  Alkaline Phos 38 - 126 U/L 65 63 56  AST 15 - 41 U/L 14(L) 13(L) 12(L)  ALT 0 - 44 U/L _2 Lab Results  Component Value Date   WBC 9.0 07/26/2020   HGB 11.9 (L) 07/26/2020   HCT 35.4 (L) 07/26/2020   MCV 90.3 07/26/2020   PLT 302 07/26/2020   NEUTROABS 5.7 07/26/2020    ASSESSMENT & PLAN:  Malignant neoplasm of upper-outer quadrant of right breast in female, estrogen receptor positive ([place]) 05/01/18:Bilateral mastectomies: Left mastectomy: DCIS intermediate grade 0.9 cm margins negative Tis NX stage 0; right mastectomy: ILC, grade 3, 3.5 cm, with LCIS, perineural invasion present, margins negative, 15/22 lymph nodes positive with extracapsular extension, ER 90%, PR 0%, HER-2 +(IHC 3+), Ki-67 5%, T2N3M1Stage IV  06/03/2018 bone scan: Increased tracer uptake in the lumbar spine at L1, L3 and L4, right fourth and seventh ribs, inferior right scapula and distal sternum, T7 and T11 as well.  CT CAPshowed widespread bone metastases with possible pathologic fracture at L3, peripheral right upper lobe nodules  nonspecific Patient has cerebral palsy and hersisteris the power of attorney for healthcare Treatment plan: Palliative treatment with Taxolx12 cycles completed 08/26/2018,Kingenti, Perjeta ------------------------------------------------------------------------------------------------------------------------ PET CT scan:10/27/2018:Widespread bone metastatic disease demonstrates reduced with sclerosis and low metabolic activity with SUV of 3-4. Left level 4 lymph node 0.5 cm and left deep parotid lymph node SUV 4.1 will need follow-up. Uterine leiomyomas.  Current treatment:Kingenti maintenance along with anastrozole  Anastrozole toxicities: Denies any hot flashes or myalgias. Kingenti toxicities: None Intermittent diarrhea  PET/CT 11/11/2019: Continued mild hypermetabolic activity left supraclavicular lymph node 0.7 cm, old sclerotic bone lesions are stable and not hypermetabolic. Uterine fibroids  PET CT scheduled for 08/08/20  Return to clinic every6 weeks for Kingenti     No orders of the defined types were placed in this encounter.  The patient has a good understanding of the overall plan. she agrees with it. she will call with any problems that may develop before the next visit here.  Total time spent: 30 mins including face to face time and time spent for planning, charting and coordination of care  Rulon Eisenmenger, MD, MPH 07/26/2020  I, Molly Dorshimer, am acting as scribe for Dr. Nicholas Lose.  I have reviewed the above documentation for accuracy and completeness, and I agree with the above.

## 2020-07-25 NOTE — Assessment & Plan Note (Signed)
05/01/18:Bilateral mastectomies: Left mastectomy: DCIS intermediate grade 0.9 cm margins negative Tis NX stage 0; right mastectomy: ILC, grade 3, 3.5 cm, with LCIS, perineural invasion present, margins negative, 15/22 lymph nodes positive with extracapsular extension, ER 90%, PR 0%, HER-2 +(IHC 3+), Ki-67 5%, T2N3M1Stage IV  06/03/2018 bone scan: Increased tracer uptake in the lumbar spine at L1, L3 and L4, right fourth and seventh ribs, inferior right scapula and distal sternum, T7 and T11 as well.  CT CAPshowed widespread bone metastases with possible pathologic fracture at L3, peripheral right upper lobe nodules nonspecific Patient has cerebral palsy and hersisteris the power of attorney for healthcare Treatment plan: Palliative treatment with Taxolx12 cycles completed 08/26/2018,Kingenti, Perjeta ------------------------------------------------------------------------------------------------------------------------ PET CT scan:10/27/2018:Widespread bone metastatic disease demonstrates reduced with sclerosis and low metabolic activity with SUV of 3-4. Left level 4 lymph node 0.5 cm and left deep parotid lymph node SUV 4.1 will need follow-up. Uterine leiomyomas.  Current treatment:Kingenti maintenance along with anastrozole  Anastrozole toxicities: Denies any hot flashes or myalgias. Kingenti toxicities: None Intermittent diarrhea  PET/CT 11/11/2019: Continued mild hypermetabolic activity left supraclavicular lymph node 0.7 cm, old sclerotic bone lesions are stable and not hypermetabolic. Uterine fibroids  PET CT scheduled for 08/08/20  Return to clinic every6 weeks for Kingenti

## 2020-07-26 ENCOUNTER — Inpatient Hospital Stay: Payer: Medicare Other

## 2020-07-26 ENCOUNTER — Inpatient Hospital Stay (HOSPITAL_BASED_OUTPATIENT_CLINIC_OR_DEPARTMENT_OTHER): Payer: Medicare Other | Admitting: Hematology and Oncology

## 2020-07-26 ENCOUNTER — Other Ambulatory Visit: Payer: Self-pay

## 2020-07-26 ENCOUNTER — Inpatient Hospital Stay: Payer: Medicare Other | Attending: Hematology and Oncology

## 2020-07-26 DIAGNOSIS — Z5112 Encounter for antineoplastic immunotherapy: Secondary | ICD-10-CM | POA: Diagnosis not present

## 2020-07-26 DIAGNOSIS — C50411 Malignant neoplasm of upper-outer quadrant of right female breast: Secondary | ICD-10-CM

## 2020-07-26 DIAGNOSIS — Z170 Estrogen receptor positive status [ER+]: Secondary | ICD-10-CM

## 2020-07-26 DIAGNOSIS — Z79899 Other long term (current) drug therapy: Secondary | ICD-10-CM | POA: Insufficient documentation

## 2020-07-26 DIAGNOSIS — Z95828 Presence of other vascular implants and grafts: Secondary | ICD-10-CM

## 2020-07-26 DIAGNOSIS — Z7189 Other specified counseling: Secondary | ICD-10-CM

## 2020-07-26 DIAGNOSIS — C7951 Secondary malignant neoplasm of bone: Secondary | ICD-10-CM | POA: Insufficient documentation

## 2020-07-26 LAB — CMP (CANCER CENTER ONLY)
ALT: 16 U/L (ref 0–44)
AST: 12 U/L — ABNORMAL LOW (ref 15–41)
Albumin: 3.7 g/dL (ref 3.5–5.0)
Alkaline Phosphatase: 61 U/L (ref 38–126)
Anion gap: 6 (ref 5–15)
BUN: 13 mg/dL (ref 6–20)
CO2: 24 mmol/L (ref 22–32)
Calcium: 8.8 mg/dL — ABNORMAL LOW (ref 8.9–10.3)
Chloride: 110 mmol/L (ref 98–111)
Creatinine: 0.69 mg/dL (ref 0.44–1.00)
GFR, Estimated: >60 mL/min (ref >60)
Glucose, Bld: 149 mg/dL — ABNORMAL HIGH (ref 70–99)
Potassium: 3.6 mmol/L (ref 3.5–5.1)
Sodium: 140 mmol/L (ref 135–145)
Total Bilirubin: 0.4 mg/dL (ref 0.3–1.2)
Total Protein: 7.6 g/dL (ref 6.5–8.1)

## 2020-07-26 LAB — CBC WITH DIFFERENTIAL (CANCER CENTER ONLY)
Abs Immature Granulocytes: 0.02 10*3/uL (ref 0.00–0.07)
Basophils Absolute: 0 10*3/uL (ref 0.0–0.1)
Basophils Relative: 0 %
Eosinophils Absolute: 0.1 10*3/uL (ref 0.0–0.5)
Eosinophils Relative: 1 %
HCT: 35.4 % — ABNORMAL LOW (ref 36.0–46.0)
Hemoglobin: 11.9 g/dL — ABNORMAL LOW (ref 12.0–15.0)
Immature Granulocytes: 0 %
Lymphocytes Relative: 28 %
Lymphs Abs: 2.5 10*3/uL (ref 0.7–4.0)
MCH: 30.4 pg (ref 26.0–34.0)
MCHC: 33.6 g/dL (ref 30.0–36.0)
MCV: 90.3 fL (ref 80.0–100.0)
Monocytes Absolute: 0.6 10*3/uL (ref 0.1–1.0)
Monocytes Relative: 6 %
Neutro Abs: 5.7 10*3/uL (ref 1.7–7.7)
Neutrophils Relative %: 65 %
Platelet Count: 302 10*3/uL (ref 150–400)
RBC: 3.92 MIL/uL (ref 3.87–5.11)
RDW: 13.4 % (ref 11.5–15.5)
WBC Count: 9 10*3/uL (ref 4.0–10.5)
nRBC: 0 % (ref 0.0–0.2)

## 2020-07-26 MED ORDER — ACETAMINOPHEN 325 MG PO TABS
650.0000 mg | ORAL_TABLET | Freq: Once | ORAL | Status: AC
  Administered 2020-07-26: 650 mg via ORAL

## 2020-07-26 MED ORDER — SODIUM CHLORIDE 0.9% FLUSH
10.0000 mL | INTRAVENOUS | Status: DC | PRN
  Administered 2020-07-26: 10 mL
  Filled 2020-07-26: qty 10

## 2020-07-26 MED ORDER — TRASTUZUMAB-ANNS CHEMO 150 MG IV SOLR
6.0000 mg/kg | Freq: Once | INTRAVENOUS | Status: AC
  Administered 2020-07-26: 336 mg via INTRAVENOUS
  Filled 2020-07-26: qty 16

## 2020-07-26 MED ORDER — ACETAMINOPHEN 325 MG PO TABS
ORAL_TABLET | ORAL | Status: AC
  Filled 2020-07-26: qty 2

## 2020-07-26 MED ORDER — HEPARIN SOD (PORK) LOCK FLUSH 100 UNIT/ML IV SOLN
500.0000 [IU] | Freq: Once | INTRAVENOUS | Status: AC | PRN
  Administered 2020-07-26: 500 [IU]
  Filled 2020-07-26: qty 5

## 2020-07-26 MED ORDER — SODIUM CHLORIDE 0.9 % IV SOLN
Freq: Once | INTRAVENOUS | Status: AC
  Filled 2020-07-26: qty 250

## 2020-07-26 MED ORDER — DIPHENHYDRAMINE HCL 25 MG PO CAPS
25.0000 mg | ORAL_CAPSULE | Freq: Once | ORAL | Status: AC
  Administered 2020-07-26: 25 mg via ORAL

## 2020-07-26 MED ORDER — DIPHENHYDRAMINE HCL 25 MG PO CAPS
ORAL_CAPSULE | ORAL | Status: AC
  Filled 2020-07-26: qty 1

## 2020-07-26 NOTE — Patient Instructions (Signed)

## 2020-07-26 NOTE — Patient Instructions (Signed)
[facility] Discharge Instructions for Patients Receiving Chemotherapy  Today you received the following chemotherapy agents trastuzumab.  To help prevent nausea and vomiting after your treatment, we encourage you to take your nausea medication as directed.    If you develop nausea and vomiting that is not controlled by your nausea medication, call the clinic.   BELOW ARE SYMPTOMS THAT SHOULD BE REPORTED IMMEDIATELY:  *FEVER GREATER THAN 100.5 F  *CHILLS WITH OR WITHOUT FEVER  NAUSEA AND VOMITING THAT IS NOT CONTROLLED WITH YOUR NAUSEA MEDICATION  *UNUSUAL SHORTNESS OF BREATH  *UNUSUAL BRUISING OR BLEEDING  TENDERNESS IN MOUTH AND THROAT WITH OR WITHOUT PRESENCE OF ULCERS  *URINARY PROBLEMS  *BOWEL PROBLEMS  UNUSUAL RASH Items with * indicate a potential emergency and should be followed up as soon as possible.  Feel free to call the clinic should you have any questions or concerns. The clinic phone number is [phone].  Please show the CHEMO ALERT CARD at check-in to the Emergency Department and triage nurse.   

## 2020-08-02 ENCOUNTER — Other Ambulatory Visit: Payer: Self-pay | Admitting: Hematology and Oncology

## 2020-08-06 ENCOUNTER — Other Ambulatory Visit: Payer: Self-pay | Admitting: Hematology and Oncology

## 2020-08-06 DIAGNOSIS — E785 Hyperlipidemia, unspecified: Secondary | ICD-10-CM

## 2020-08-08 ENCOUNTER — Ambulatory Visit (HOSPITAL_COMMUNITY): Payer: Medicare Other

## 2020-08-09 ENCOUNTER — Other Ambulatory Visit: Payer: Self-pay | Admitting: Hematology and Oncology

## 2020-08-11 ENCOUNTER — Other Ambulatory Visit: Payer: Self-pay | Admitting: *Deleted

## 2020-08-11 DIAGNOSIS — Z170 Estrogen receptor positive status [ER+]: Secondary | ICD-10-CM

## 2020-08-11 DIAGNOSIS — C50411 Malignant neoplasm of upper-outer quadrant of right female breast: Secondary | ICD-10-CM

## 2020-08-11 NOTE — Progress Notes (Signed)
Received message from [facility] team stating pt insurance has denied PET Scan.  Per MD pt needing CT CAP and Bone Scan for further evaluation.  Orders placed and PA team notified to work on authorization. Pt notified and verbalized understanding.

## 2020-08-17 ENCOUNTER — Ambulatory Visit (HOSPITAL_COMMUNITY): Payer: Medicare Other

## 2020-08-22 ENCOUNTER — Other Ambulatory Visit: Payer: Self-pay

## 2020-08-22 ENCOUNTER — Encounter (HOSPITAL_COMMUNITY)
Admission: RE | Admit: 2020-08-22 | Discharge: 2020-08-22 | Disposition: A | Discharge status: Home / Self Care | Payer: Medicare Other | Source: Ambulatory Visit | Attending: Hematology and Oncology | Admitting: Hematology and Oncology

## 2020-08-22 ENCOUNTER — Encounter (HOSPITAL_COMMUNITY): Payer: Self-pay

## 2020-08-22 ENCOUNTER — Ambulatory Visit (HOSPITAL_COMMUNITY)
Admission: RE | Admit: 2020-08-22 | Discharge: 2020-08-22 | Disposition: A | Discharge status: Home / Self Care | Payer: Medicare Other | Source: Ambulatory Visit | Attending: Hematology and Oncology | Admitting: Hematology and Oncology

## 2020-08-22 DIAGNOSIS — S32000A Wedge compression fracture of unspecified lumbar vertebra, initial encounter for closed fracture: Secondary | ICD-10-CM | POA: Diagnosis not present

## 2020-08-22 DIAGNOSIS — C50411 Malignant neoplasm of upper-outer quadrant of right female breast: Secondary | ICD-10-CM

## 2020-08-22 DIAGNOSIS — Z5111 Encounter for antineoplastic chemotherapy: Secondary | ICD-10-CM | POA: Diagnosis not present

## 2020-08-22 DIAGNOSIS — M19011 Primary osteoarthritis, right shoulder: Secondary | ICD-10-CM | POA: Diagnosis not present

## 2020-08-22 DIAGNOSIS — Z170 Estrogen receptor positive status [ER+]: Secondary | ICD-10-CM | POA: Diagnosis not present

## 2020-08-22 DIAGNOSIS — C50919 Malignant neoplasm of unspecified site of unspecified female breast: Secondary | ICD-10-CM | POA: Diagnosis not present

## 2020-08-22 DIAGNOSIS — M19071 Primary osteoarthritis, right ankle and foot: Secondary | ICD-10-CM | POA: Diagnosis not present

## 2020-08-22 DIAGNOSIS — N12 Tubulo-interstitial nephritis, not specified as acute or chronic: Secondary | ICD-10-CM | POA: Diagnosis not present

## 2020-08-22 DIAGNOSIS — C7951 Secondary malignant neoplasm of bone: Secondary | ICD-10-CM | POA: Diagnosis not present

## 2020-08-22 DIAGNOSIS — C50911 Malignant neoplasm of unspecified site of right female breast: Secondary | ICD-10-CM | POA: Diagnosis not present

## 2020-08-22 MED ORDER — HEPARIN SOD (PORK) LOCK FLUSH 100 UNIT/ML IV SOLN
INTRAVENOUS | Status: AC
  Filled 2020-08-22: qty 5

## 2020-08-22 MED ORDER — HEPARIN SOD (PORK) LOCK FLUSH 100 UNIT/ML IV SOLN
500.0000 [IU] | Freq: Once | INTRAVENOUS | Status: AC
  Administered 2020-08-22: 500 [IU] via INTRAVENOUS

## 2020-08-22 MED ORDER — IOHEXOL 300 MG/ML  SOLN
100.0000 mL | Freq: Once | INTRAMUSCULAR | Status: AC | PRN
  Administered 2020-08-22: 100 mL via INTRAVENOUS

## 2020-08-22 MED ORDER — TECHNETIUM TC 99M MEDRONATE IV KIT
19.2000 | PACK | Freq: Once | INTRAVENOUS | Status: AC
  Administered 2020-08-22: 19.2 via INTRAVENOUS

## 2020-09-01 ENCOUNTER — Other Ambulatory Visit: Payer: Self-pay | Admitting: Hematology and Oncology

## 2020-09-06 ENCOUNTER — Inpatient Hospital Stay: Payer: Medicare Other | Attending: Hematology and Oncology

## 2020-09-06 ENCOUNTER — Inpatient Hospital Stay: Payer: Medicare Other

## 2020-09-06 ENCOUNTER — Other Ambulatory Visit: Payer: Self-pay

## 2020-09-06 VITALS — BP 142/78 | HR 92 | Temp 98.3°F | Resp 18

## 2020-09-06 DIAGNOSIS — Z5112 Encounter for antineoplastic immunotherapy: Secondary | ICD-10-CM | POA: Diagnosis not present

## 2020-09-06 DIAGNOSIS — C7951 Secondary malignant neoplasm of bone: Secondary | ICD-10-CM | POA: Diagnosis not present

## 2020-09-06 DIAGNOSIS — Z7189 Other specified counseling: Secondary | ICD-10-CM

## 2020-09-06 DIAGNOSIS — C50411 Malignant neoplasm of upper-outer quadrant of right female breast: Secondary | ICD-10-CM | POA: Insufficient documentation

## 2020-09-06 DIAGNOSIS — Z170 Estrogen receptor positive status [ER+]: Secondary | ICD-10-CM | POA: Insufficient documentation

## 2020-09-06 DIAGNOSIS — Z79899 Other long term (current) drug therapy: Secondary | ICD-10-CM | POA: Diagnosis not present

## 2020-09-06 DIAGNOSIS — Z95828 Presence of other vascular implants and grafts: Secondary | ICD-10-CM

## 2020-09-06 LAB — CMP (CANCER CENTER ONLY)
ALT: 16 U/L (ref 0–44)
AST: 14 U/L — ABNORMAL LOW (ref 15–41)
Albumin: 3.7 g/dL (ref 3.5–5.0)
Alkaline Phosphatase: 59 U/L (ref 38–126)
Anion gap: 12 (ref 5–15)
BUN: 13 mg/dL (ref 6–20)
CO2: 23 mmol/L (ref 22–32)
Calcium: 9 mg/dL (ref 8.9–10.3)
Chloride: 109 mmol/L (ref 98–111)
Creatinine: 0.66 mg/dL (ref 0.44–1.00)
GFR, Estimated: >60 mL/min (ref >60)
Glucose, Bld: 149 mg/dL — ABNORMAL HIGH (ref 70–99)
Potassium: 3.9 mmol/L (ref 3.5–5.1)
Sodium: 144 mmol/L (ref 135–145)
Total Bilirubin: 0.3 mg/dL (ref 0.3–1.2)
Total Protein: 7.7 g/dL (ref 6.5–8.1)

## 2020-09-06 LAB — CBC WITH DIFFERENTIAL (CANCER CENTER ONLY)
Abs Immature Granulocytes: 0.02 10*3/uL (ref 0.00–0.07)
Basophils Absolute: 0 10*3/uL (ref 0.0–0.1)
Basophils Relative: 1 %
Eosinophils Absolute: 0.1 10*3/uL (ref 0.0–0.5)
Eosinophils Relative: 1 %
HCT: 36 % (ref 36.0–46.0)
Hemoglobin: 12.1 g/dL (ref 12.0–15.0)
Immature Granulocytes: 0 %
Lymphocytes Relative: 27 %
Lymphs Abs: 2.3 10*3/uL (ref 0.7–4.0)
MCH: 30.6 pg (ref 26.0–34.0)
MCHC: 33.6 g/dL (ref 30.0–36.0)
MCV: 91.1 fL (ref 80.0–100.0)
Monocytes Absolute: 0.5 10*3/uL (ref 0.1–1.0)
Monocytes Relative: 5 %
Neutro Abs: 5.8 10*3/uL (ref 1.7–7.7)
Neutrophils Relative %: 66 %
Platelet Count: 298 10*3/uL (ref 150–400)
RBC: 3.95 MIL/uL (ref 3.87–5.11)
RDW: 13.5 % (ref 11.5–15.5)
WBC Count: 8.6 10*3/uL (ref 4.0–10.5)
nRBC: 0 % (ref 0.0–0.2)

## 2020-09-06 MED ORDER — TRASTUZUMAB-ANNS CHEMO 150 MG IV SOLR
6.0000 mg/kg | Freq: Once | INTRAVENOUS | Status: AC
  Administered 2020-09-06: 336 mg via INTRAVENOUS
  Filled 2020-09-06: qty 16

## 2020-09-06 MED ORDER — ACETAMINOPHEN 325 MG PO TABS
ORAL_TABLET | ORAL | Status: AC
  Filled 2020-09-06: qty 2

## 2020-09-06 MED ORDER — SODIUM CHLORIDE 0.9% FLUSH
10.0000 mL | INTRAVENOUS | Status: DC | PRN
  Administered 2020-09-06: 10 mL
  Filled 2020-09-06: qty 10

## 2020-09-06 MED ORDER — SODIUM CHLORIDE 0.9 % IV SOLN
Freq: Once | INTRAVENOUS | Status: AC
Start: 2020-09-06 — End: 2020-09-06
  Filled 2020-09-06: qty 250

## 2020-09-06 MED ORDER — DIPHENHYDRAMINE HCL 25 MG PO CAPS
25.0000 mg | ORAL_CAPSULE | Freq: Once | ORAL | Status: AC
  Administered 2020-09-06: 25 mg via ORAL

## 2020-09-06 MED ORDER — HEPARIN SOD (PORK) LOCK FLUSH 100 UNIT/ML IV SOLN
500.0000 [IU] | Freq: Once | INTRAVENOUS | Status: AC | PRN
  Administered 2020-09-06: 500 [IU]
  Filled 2020-09-06: qty 5

## 2020-09-06 MED ORDER — DIPHENHYDRAMINE HCL 25 MG PO CAPS
ORAL_CAPSULE | ORAL | Status: AC
  Filled 2020-09-06: qty 1

## 2020-09-06 MED ORDER — ACETAMINOPHEN 325 MG PO TABS
650.0000 mg | ORAL_TABLET | Freq: Once | ORAL | Status: AC
Start: 2020-09-06 — End: 2020-09-06
  Administered 2020-09-06: 650 mg via ORAL

## 2020-09-06 NOTE — Patient Instructions (Signed)
[facility]  Discharge Instructions: Thank you for choosing [facility] to provide your oncology and hematology care.   If you have a lab appointment with the [facility], please go directly to the [facility] and check in at the registration area.   Wear comfortable clothing and clothing appropriate for easy access to any Portacath or PICC line.   We strive to give you quality time with your provider. You may need to reschedule your appointment if you arrive late (15 or more minutes).  Arriving late affects you and other patients whose appointments are after yours.  Also, if you miss three or more appointments without notifying the office, you may be dismissed from the clinic at the provider's discretion.      For prescription refill requests, have your pharmacy contact our office and allow 72 hours for refills to be completed.    Today you received the following chemotherapy and/or immunotherapy agents: Trastuzumab (Kanjinti)     To help prevent nausea and vomiting after your treatment, we encourage you to take your nausea medication as directed.  BELOW ARE SYMPTOMS THAT SHOULD BE REPORTED IMMEDIATELY: . *FEVER GREATER THAN 100.4 F (38 C) OR HIGHER . *CHILLS OR SWEATING . *NAUSEA AND VOMITING THAT IS NOT CONTROLLED WITH YOUR NAUSEA MEDICATION . *UNUSUAL SHORTNESS OF BREATH . *UNUSUAL BRUISING OR BLEEDING . *URINARY PROBLEMS (pain or burning when urinating, or frequent urination) . *BOWEL PROBLEMS (unusual diarrhea, constipation, pain near the anus) . TENDERNESS IN MOUTH AND THROAT WITH OR WITHOUT PRESENCE OF ULCERS (sore throat, sores in mouth, or a toothache) . UNUSUAL RASH, SWELLING OR PAIN  . UNUSUAL VAGINAL DISCHARGE OR ITCHING   Items with * indicate a potential emergency and should be followed up as soon as possible or go to the Emergency Department if any problems should occur.  Please show the CHEMOTHERAPY ALERT CARD or  IMMUNOTHERAPY ALERT CARD at check-in to the Emergency Department and triage nurse.  Should you have questions after your visit or need to cancel or reschedule your appointment, please contact [facility]  Dept: [phone]  and follow the prompts.  Office hours are 8:00 a.m. to 4:30 p.m. Monday - Friday. Please note that voicemails left after 4:00 p.m. may not be returned until the following business day.  We are closed weekends and major holidays. You have access to a nurse at all times for urgent questions. Please call the main number to the clinic Dept: [phone] and follow the prompts.   For any non-urgent questions, you may also contact your provider using MyChart. We now offer e-Visits for anyone 39 and older to request care online for non-urgent symptoms. For details visit [email or web address].   Also download the MyChart app! Go to the app store, search "MyChart", open the app, select [facility], and log in with your MyChart username and password.  Due to Covid, a mask is required upon entering the hospital/clinic. If you do not have a mask, one will be given to you upon arrival. For doctor visits, patients may have 1 support person aged 70 or older with them. For treatment visits, patients cannot have anyone with them due to current Covid guidelines and our immunocompromised population.

## 2020-09-06 NOTE — Patient Instructions (Signed)
Implanted Port Insertion, Care After This sheet gives you information about how to care for yourself after your procedure. Your health care provider may also give you more specific instructions. If you have problems or questions, contact your health care provider. What can I expect after the procedure? After the procedure, it is common to have:  Discomfort at the port insertion site.  Bruising on the skin over the port. This should improve over 3-4 days. Follow these instructions at home: Port care  After your port is placed, you will get a manufacturer's information card. The card has information about your port. Keep this card with you at all times.  Take care of the port as told by your health care provider. Ask your health care provider if you or a family member can get training for taking care of the port at home. A home health care nurse may also take care of the port.  Make sure to remember what type of port you have. Incision care  Follow instructions from your health care provider about how to take care of your port insertion site. Make sure you: ? Wash your hands with soap and water before and after you change your bandage (dressing). If soap and water are not available, use hand sanitizer. ? Change your dressing as told by your health care provider. ? Leave stitches (sutures), skin glue, or adhesive strips in place. These skin closures may need to stay in place for 2 weeks or longer. If adhesive strip edges start to loosen and curl up, you may trim the loose edges. Do not remove adhesive strips completely unless your health care provider tells you to do that.  Check your port insertion site every day for signs of infection. Check for: ? Redness, swelling, or pain. ? Fluid or blood. ? Warmth. ? Pus or a bad smell.      Activity  Return to your normal activities as told by your health care provider. Ask your health care provider what activities are safe for you.  Do not  lift anything that is heavier than 10 lb (4.5 kg), or the limit that you are told, until your health care provider says that it is safe. General instructions  Take over-the-counter and prescription medicines only as told by your health care provider.  Do not take baths, swim, or use a hot tub until your health care provider approves. Ask your health care provider if you may take showers. You may only be allowed to take sponge baths.  Do not drive for 24 hours if you were given a sedative during your procedure.  Wear a medical alert bracelet in case of an emergency. This will tell any health care providers that you have a port.  Keep all follow-up visits as told by your health care provider. This is important. Contact a health care provider if:  You cannot flush your port with saline as directed, or you cannot draw blood from the port.  You have a fever or chills.  You have redness, swelling, or pain around your port insertion site.  You have fluid or blood coming from your port insertion site.  Your port insertion site feels warm to the touch.  You have pus or a bad smell coming from the port insertion site. Get help right away if:  You have chest pain or shortness of breath.  You have bleeding from your port that you cannot control. Summary  Take care of the port as told by your   health care provider. Keep the manufacturer's information card with you at all times.  Change your dressing as told by your health care provider.  Contact a health care provider if you have a fever or chills or if you have redness, swelling, or pain around your port insertion site.  Keep all follow-up visits as told by your health care provider. This information is not intended to replace advice given to you by your health care provider. Make sure you discuss any questions you have with your health care provider. Document Revised: 11/25/2017 Document Reviewed: 11/25/2017 Elsevier Patient Education   2021 Elsevier Inc.  

## 2020-09-11 ENCOUNTER — Other Ambulatory Visit: Payer: Self-pay | Admitting: Hematology and Oncology

## 2020-09-11 DIAGNOSIS — I10 Essential (primary) hypertension: Secondary | ICD-10-CM

## 2020-09-23 ENCOUNTER — Other Ambulatory Visit: Payer: Self-pay | Admitting: Hematology and Oncology

## 2020-10-16 ENCOUNTER — Encounter: Payer: Self-pay | Admitting: *Deleted

## 2020-10-17 ENCOUNTER — Other Ambulatory Visit: Payer: Self-pay | Admitting: *Deleted

## 2020-10-17 DIAGNOSIS — Z5181 Encounter for therapeutic drug level monitoring: Secondary | ICD-10-CM

## 2020-10-17 DIAGNOSIS — Z79899 Other long term (current) drug therapy: Secondary | ICD-10-CM

## 2020-10-17 NOTE — Progress Notes (Signed)
Patient Care Team: Janora Norlander, DO as PCP - General (Family Medicine) Danie Binder, MD (Inactive) (Gastroenterology) Alphonsa Overall, MD as Consulting Physician (General Surgery) Nicholas Lose, MD as Consulting Physician (Hematology and Oncology) Kyung Rudd, MD as Consulting Physician (Radiation Oncology) Mauro Kaufmann, RN as Oncology Nurse Navigator Rockwell Germany, RN as Oncology Nurse Navigator  DIAGNOSIS:    ICD-10-CM   1. Malignant neoplasm of upper-outer quadrant of right breast in female, estrogen receptor positive ([place])  C50.411    Z17.0     SUMMARY OF ONCOLOGIC HISTORY: Oncology History  Malignant neoplasm of upper-outer quadrant of right breast in female, estrogen receptor positive ([place])  03/18/2018 Initial Diagnosis   Screening detected bilateral breast abnormalities.  Right breast mass UOQ 2.1 cm at 10 o'clock position: Grade 1 ILC ER 70%, PR 0%, HER-2 +3+ by IHC, Ki-67 5%, suspected lymph node in the axilla could not be biopsied; left breast calcifications by ultrasound not visible but 5 cm cystic/tubular structure which on biopsy was fibrocystic change with ADHl; T2N0 stage IIa clinical stage   05/01/2018 Surgery   Bilateral mastectomies: Left mastectomy: DCIS intermediate grade 0.9 cm margins negative Tis NX stage 0; right mastectomy: ILC, grade 3, 3.5 cm, with LCIS, perineural invasion present, margins negative, 15/22 lymph nodes positive with extracapsular extension, ER 90%, PR 0%, HER-2 +(IHC 3+), Ki-67 5%, T2N3 Stage 3B    05/11/2018 Cancer Staging   Staging form: Breast, AJCC 8th Edition - Pathologic stage from 05/11/2018: Stage IIIB (pT2, pN3, cM0, G3, ER+, PR-, HER2+) - Signed by Nicholas Lose, MD on 05/11/2018   06/03/2018 Imaging   Bone scan: Increased tracer uptake in the lumbar spine at L1, L3 and L4, right fourth and seventh ribs, inferior right scapula and distal sternum, T7 and T11 as well.   CT CAP showed widespread bone metastases with  possible pathologic fracture at L3, peripheral right upper lobe nodules nonspecific   06/10/2018 -  Chemotherapy   Taxol Kingenti and Perjeta    10/27/2018 PET scan   Bone metastases less metabolically active.  Couple of lymph nodes with low-level metabolic activity noted.     CHIEF COMPLIANT: Follow-up of Kingenti maintenance andanastrozole  INTERVAL HISTORY: [name] is a 55 y.o. with above-mentioned history of metastatic breast cancer currently on treatment withmaintenance Kingenti and on anti-estrogen therapy withanastrozole. CT CAP on 08/22/20 showed no active or progressive malignancy, with osseous lesions appearing as sclerotic and less defined than prior scans. Bone scan on 08/22/20 showed no compelling findings of active osseous metastases. She presents to the clinic todayfor treatment.   ALLERGIES:  is allergic to hydrocodone.  MEDICATIONS:  Current Outpatient Medications  Medication Sig Dispense Refill  . diphenoxylate-atropine (LOMOTIL) 2.5-0.025 MG tablet TAKE 1 TO 2 TABLETS BY MOUTH 4 TIMES A DAY AS NEEDED FOR DIARRHEA 40 tablet 1  . ibuprofen (ADVIL,MOTRIN) 600 MG tablet Take 1 tablet (600 mg total) by mouth every 6 (six) hours as needed (for mild pain not relieved by other medications.). 30 tablet 0  . lisinopril (ZESTRIL) 40 MG tablet TAKE 1 TABLET BY MOUTH EVERY DAY 90 tablet 3  . megestrol (MEGACE) 40 MG tablet TAKE 3 TABLETS EVERY DAY 270 tablet 11  . megestrol (MEGACE) 40 MG tablet TAKE 1 TABLET (40 MG TOTAL) BY MOUTH 2 (TWO) TIMES DAILY. TAKE 3 TABLETS EVERY DAY 180 tablet 3  . ondansetron (ZOFRAN-ODT) 4 MG disintegrating tablet Take 1 tablet (4 mg total) by mouth every 6 (six)  hours as needed for nausea. 15 tablet 0  . pantoprazole (PROTONIX) 40 MG tablet TAKE 1 TABLET BY MOUTH 30 MINUTES PRIOR TO BREAKFAST AND SUPPER 180 tablet 3  . polyethylene glycol (MIRALAX / GLYCOLAX) packet Take 17 g by mouth daily as needed for moderate constipation.    . potassium  chloride (KLOR-CON) 10 MEQ tablet TAKE 1 TABLET BY MOUTH EVERY DAY 30 tablet 1  . pravastatin (PRAVACHOL) 40 MG tablet TAKE 1 TABLET BY MOUTH DAILY 90 tablet 3  . tamoxifen (NOLVADEX) 20 MG tablet TAKE 1 TABLET BY MOUTH EVERY DAY 90 tablet 3   No current facility-administered medications for this visit.    PHYSICAL EXAMINATION: ECOG PERFORMANCE STATUS: 1 - Symptomatic but completely ambulatory  Vitals:   10/18/20 0916  BP: 138/67  Pulse: 87  Resp: 17  Temp: 98.1 F (36.7 C)  SpO2: 98%   Filed Weights   10/18/20 0916  Weight: 128 lb 8 oz (58.3 kg)    LABORATORY DATA:  I have reviewed the data as listed CMP Latest Ref Rng & Units 09/06/2020 07/26/2020 06/14/2020  Glucose 70 - 99 mg/dL 149(H) 149(H) 130(H)  BUN 6 - 20 mg/dL _0 Creatinine 0.44 - 1.00 mg/dL 0.66 0.69 0.76  Sodium 135 - 145 mmol/L 144 140 140  Potassium 3.5 - 5.1 mmol/L 3.9 3.6 3.9  Chloride 98 - 111 mmol/L 109 110 110  CO2 22 - 32 mmol/L _1 Calcium 8.9 - 10.3 mg/dL 9.0 8.8(L) 9.1  Total Protein 6.5 - 8.1 g/dL 7.7 7.6 7.7  Total Bilirubin 0.3 - 1.2 mg/dL 0.3 0.4 0.4  Alkaline Phos 38 - 126 U/L 59 61 65  AST 15 - 41 U/L 14(L) 12(L) 14(L)  ALT 0 - 44 U/L _2 Lab Results  Component Value Date   WBC 8.4 10/18/2020   HGB 12.1 10/18/2020   HCT 35.6 (L) 10/18/2020   MCV 91.3 10/18/2020   PLT 243 10/18/2020   NEUTROABS 5.4 10/18/2020    ASSESSMENT & PLAN:  Malignant neoplasm of upper-outer quadrant of right breast in female, estrogen receptor positive ([place]) 05/01/18:Bilateral mastectomies: Left mastectomy: DCIS intermediate grade 0.9 cm margins negative Tis NX stage 0; right mastectomy: ILC, grade 3, 3.5 cm, with LCIS, perineural invasion present, margins negative, 15/22 lymph nodes positive with extracapsular extension, ER 90%, PR 0%, HER-2 +(IHC 3+), Ki-67 5%, T2N3M1Stage IV  06/03/2018 bone scan: Increased tracer uptake in the lumbar spine at L1, L3 and L4, right fourth and seventh  ribs, inferior right scapula and distal sternum, T7 and T11 as well.  CT CAPshowed widespread bone metastases with possible pathologic fracture at L3, peripheral right upper lobe nodules nonspecific Patient has cerebral palsy and hersisteris the power of attorney for healthcare Treatment plan: Palliative treatment with Taxolx12 cycles completed 08/26/2018,Kingenti, Perjeta ------------------------------------------------------------------------------------------------------------------------ PET CT scan:10/27/2018:Widespread bone metastatic disease demonstrates reduced with sclerosis and low metabolic activity with SUV of 3-4. Left level 4 lymph node 0.5 cm and left deep parotid lymph node SUV 4.1 will need follow-up. Uterine leiomyomas.  Current treatment:Kingenti maintenance along with anastrozole  Anastrozole toxicities: Denies any hot flashes or myalgias. Kingenti toxicities: None Intermittent diarrhea  PET/CT 11/11/2019: Continued mild hypermetabolic activity left supraclavicular lymph node 0.7 cm, old sclerotic bone lesions are stable and not hypermetabolic. Uterine fibroids  CT CAP 08/23/20: No findings of active or progressive malignancy, remote innumerable osseus mets, left supra clav LN smaller  Next scan will be done in  October 2022. Return to clinic every6 weeks for Kingenti     No orders of the defined types were placed in this encounter.  The patient has a good understanding of the overall plan. she agrees with it. she will call with any problems that may develop before the next visit here.  Total time spent: 30 mins including face to face time and time spent for planning, charting and coordination of care  Rulon Eisenmenger, MD, MPH 10/18/2020  I, Cloyde Reams Dorshimer, am acting as scribe for Dr. Nicholas Lose.  I have reviewed the above documentation for accuracy and completeness, and I agree with the above.

## 2020-10-17 NOTE — Assessment & Plan Note (Signed)
05/01/18:Bilateral mastectomies: Left mastectomy: DCIS intermediate grade 0.9 cm margins negative Tis NX stage 0; right mastectomy: ILC, grade 3, 3.5 cm, with LCIS, perineural invasion present, margins negative, 15/22 lymph nodes positive with extracapsular extension, ER 90%, PR 0%, HER-2 +(IHC 3+), Ki-67 5%, T2N3M1Stage IV  06/03/2018 bone scan: Increased tracer uptake in the lumbar spine at L1, L3 and L4, right fourth and seventh ribs, inferior right scapula and distal sternum, T7 and T11 as well.  CT CAPshowed widespread bone metastases with possible pathologic fracture at L3, peripheral right upper lobe nodules nonspecific Patient has cerebral palsy and hersisteris the power of attorney for healthcare Treatment plan: Palliative treatment with Taxolx12 cycles completed 08/26/2018,Kingenti, Perjeta ------------------------------------------------------------------------------------------------------------------------ PET CT scan:10/27/2018:Widespread bone metastatic disease demonstrates reduced with sclerosis and low metabolic activity with SUV of 3-4. Left level 4 lymph node 0.5 cm and left deep parotid lymph node SUV 4.1 will need follow-up. Uterine leiomyomas.  Current treatment:Kingenti maintenance along with anastrozole  Anastrozole toxicities: Denies any hot flashes or myalgias. Kingenti toxicities: None Intermittent diarrhea  PET/CT 11/11/2019: Continued mild hypermetabolic activity left supraclavicular lymph node 0.7 cm, old sclerotic bone lesions are stable and not hypermetabolic. Uterine fibroids  CT CAP 08/23/20: No findings of active or progressive malignancy, remote innumerable osseus mets, left supra clav LN smaller   Return to clinic every6 weeks for Kingenti

## 2020-10-18 ENCOUNTER — Inpatient Hospital Stay (HOSPITAL_BASED_OUTPATIENT_CLINIC_OR_DEPARTMENT_OTHER): Payer: Medicare Other | Admitting: Hematology and Oncology

## 2020-10-18 ENCOUNTER — Other Ambulatory Visit: Payer: Self-pay

## 2020-10-18 ENCOUNTER — Inpatient Hospital Stay: Payer: Medicare Other | Attending: Hematology and Oncology

## 2020-10-18 ENCOUNTER — Other Ambulatory Visit: Payer: Medicare Other

## 2020-10-18 ENCOUNTER — Inpatient Hospital Stay: Payer: Medicare Other

## 2020-10-18 DIAGNOSIS — Z170 Estrogen receptor positive status [ER+]: Secondary | ICD-10-CM

## 2020-10-18 DIAGNOSIS — C50411 Malignant neoplasm of upper-outer quadrant of right female breast: Secondary | ICD-10-CM

## 2020-10-18 DIAGNOSIS — Z5112 Encounter for antineoplastic immunotherapy: Secondary | ICD-10-CM | POA: Insufficient documentation

## 2020-10-18 DIAGNOSIS — Z9221 Personal history of antineoplastic chemotherapy: Secondary | ICD-10-CM | POA: Insufficient documentation

## 2020-10-18 DIAGNOSIS — Z95828 Presence of other vascular implants and grafts: Secondary | ICD-10-CM

## 2020-10-18 DIAGNOSIS — Z79899 Other long term (current) drug therapy: Secondary | ICD-10-CM | POA: Diagnosis not present

## 2020-10-18 DIAGNOSIS — Z9013 Acquired absence of bilateral breasts and nipples: Secondary | ICD-10-CM | POA: Diagnosis not present

## 2020-10-18 DIAGNOSIS — Z7189 Other specified counseling: Secondary | ICD-10-CM

## 2020-10-18 LAB — CBC WITH DIFFERENTIAL (CANCER CENTER ONLY)
Abs Immature Granulocytes: 0.02 10*3/uL (ref 0.00–0.07)
Basophils Absolute: 0.1 10*3/uL (ref 0.0–0.1)
Basophils Relative: 1 %
Eosinophils Absolute: 0.1 10*3/uL (ref 0.0–0.5)
Eosinophils Relative: 1 %
HCT: 35.6 % — ABNORMAL LOW (ref 36.0–46.0)
Hemoglobin: 12.1 g/dL (ref 12.0–15.0)
Immature Granulocytes: 0 %
Lymphocytes Relative: 28 %
Lymphs Abs: 2.4 10*3/uL (ref 0.7–4.0)
MCH: 31 pg (ref 26.0–34.0)
MCHC: 34 g/dL (ref 30.0–36.0)
MCV: 91.3 fL (ref 80.0–100.0)
Monocytes Absolute: 0.5 10*3/uL (ref 0.1–1.0)
Monocytes Relative: 6 %
Neutro Abs: 5.4 10*3/uL (ref 1.7–7.7)
Neutrophils Relative %: 64 %
Platelet Count: 243 10*3/uL (ref 150–400)
RBC: 3.9 MIL/uL (ref 3.87–5.11)
RDW: 13.2 % (ref 11.5–15.5)
WBC Count: 8.4 10*3/uL (ref 4.0–10.5)
nRBC: 0 % (ref 0.0–0.2)

## 2020-10-18 LAB — CMP (CANCER CENTER ONLY)
ALT: 15 U/L (ref 0–44)
AST: 13 U/L — ABNORMAL LOW (ref 15–41)
Albumin: 3.6 g/dL (ref 3.5–5.0)
Alkaline Phosphatase: 59 U/L (ref 38–126)
Anion gap: 11 (ref 5–15)
BUN: 14 mg/dL (ref 6–20)
CO2: 23 mmol/L (ref 22–32)
Calcium: 9.2 mg/dL (ref 8.9–10.3)
Chloride: 111 mmol/L (ref 98–111)
Creatinine: 0.69 mg/dL (ref 0.44–1.00)
GFR, Estimated: >60 mL/min (ref >60)
Glucose, Bld: 145 mg/dL — ABNORMAL HIGH (ref 70–99)
Potassium: 3.9 mmol/L (ref 3.5–5.1)
Sodium: 145 mmol/L (ref 135–145)
Total Bilirubin: 0.3 mg/dL (ref 0.3–1.2)
Total Protein: 7.7 g/dL (ref 6.5–8.1)

## 2020-10-18 MED ORDER — DIPHENHYDRAMINE HCL 25 MG PO CAPS
ORAL_CAPSULE | ORAL | Status: AC
  Filled 2020-10-18: qty 1

## 2020-10-18 MED ORDER — SODIUM CHLORIDE 0.9% FLUSH
10.0000 mL | INTRAVENOUS | Status: DC | PRN
  Administered 2020-10-18: 10 mL
  Filled 2020-10-18: qty 10

## 2020-10-18 MED ORDER — DIPHENHYDRAMINE HCL 25 MG PO CAPS
25.0000 mg | ORAL_CAPSULE | Freq: Once | ORAL | Status: AC
  Administered 2020-10-18: 25 mg via ORAL

## 2020-10-18 MED ORDER — SODIUM CHLORIDE 0.9 % IV SOLN
6.0000 mg/kg | Freq: Once | INTRAVENOUS | Status: AC
  Administered 2020-10-18: 336 mg via INTRAVENOUS
  Filled 2020-10-18: qty 16

## 2020-10-18 MED ORDER — HEPARIN SOD (PORK) LOCK FLUSH 100 UNIT/ML IV SOLN
500.0000 [IU] | Freq: Once | INTRAVENOUS | Status: AC | PRN
  Administered 2020-10-18: 500 [IU]
  Filled 2020-10-18: qty 5

## 2020-10-18 MED ORDER — ACETAMINOPHEN 325 MG PO TABS
650.0000 mg | ORAL_TABLET | Freq: Once | ORAL | Status: AC
  Administered 2020-10-18: 650 mg via ORAL

## 2020-10-18 MED ORDER — SODIUM CHLORIDE 0.9 % IV SOLN
Freq: Once | INTRAVENOUS | Status: AC
  Filled 2020-10-18: qty 250

## 2020-10-18 MED ORDER — ACETAMINOPHEN 325 MG PO TABS
ORAL_TABLET | ORAL | Status: AC
  Filled 2020-10-18: qty 2

## 2020-10-18 NOTE — Patient Instructions (Signed)
[facility]  Discharge Instructions: Thank you for choosing [facility] to provide your oncology and hematology care.   If you have a lab appointment with the [facility], please go directly to the [facility] and check in at the registration area.   Wear comfortable clothing and clothing appropriate for easy access to any Portacath or PICC line.   We strive to give you quality time with your provider. You may need to reschedule your appointment if you arrive late (15 or more minutes).  Arriving late affects you and other patients whose appointments are after yours.  Also, if you miss three or more appointments without notifying the office, you may be dismissed from the clinic at the provider's discretion.      For prescription refill requests, have your pharmacy contact our office and allow 72 hours for refills to be completed.    Today you received the following chemotherapy and/or immunotherapy agents: Trastuzumab (Kanjinti)     To help prevent nausea and vomiting after your treatment, we encourage you to take your nausea medication as directed.  BELOW ARE SYMPTOMS THAT SHOULD BE REPORTED IMMEDIATELY: . *FEVER GREATER THAN 100.4 F (38 C) OR HIGHER . *CHILLS OR SWEATING . *NAUSEA AND VOMITING THAT IS NOT CONTROLLED WITH YOUR NAUSEA MEDICATION . *UNUSUAL SHORTNESS OF BREATH . *UNUSUAL BRUISING OR BLEEDING . *URINARY PROBLEMS (pain or burning when urinating, or frequent urination) . *BOWEL PROBLEMS (unusual diarrhea, constipation, pain near the anus) . TENDERNESS IN MOUTH AND THROAT WITH OR WITHOUT PRESENCE OF ULCERS (sore throat, sores in mouth, or a toothache) . UNUSUAL RASH, SWELLING OR PAIN  . UNUSUAL VAGINAL DISCHARGE OR ITCHING   Items with * indicate a potential emergency and should be followed up as soon as possible or go to the Emergency Department if any problems should occur.  Please show the CHEMOTHERAPY ALERT CARD or  IMMUNOTHERAPY ALERT CARD at check-in to the Emergency Department and triage nurse.  Should you have questions after your visit or need to cancel or reschedule your appointment, please contact [facility]  Dept: [phone]  and follow the prompts.  Office hours are 8:00 a.m. to 4:30 p.m. Monday - Friday. Please note that voicemails left after 4:00 p.m. may not be returned until the following business day.  We are closed weekends and major holidays. You have access to a nurse at all times for urgent questions. Please call the main number to the clinic Dept: [phone] and follow the prompts.   For any non-urgent questions, you may also contact your provider using MyChart. We now offer e-Visits for anyone 39 and older to request care online for non-urgent symptoms. For details visit [email or web address].   Also download the MyChart app! Go to the app store, search "MyChart", open the app, select [facility], and log in with your MyChart username and password.  Due to Covid, a mask is required upon entering the hospital/clinic. If you do not have a mask, one will be given to you upon arrival. For doctor visits, patients may have 1 support person aged 57 or older with them. For treatment visits, patients cannot have anyone with them due to current Covid guidelines and our immunocompromised population.

## 2020-10-20 ENCOUNTER — Telehealth: Payer: Self-pay | Admitting: Hematology and Oncology

## 2020-10-20 NOTE — Telephone Encounter (Signed)
Scheduled per 6/8 los. Called and spoke with pt, pt will get appts off of Mychart

## 2020-10-21 ENCOUNTER — Other Ambulatory Visit: Payer: Self-pay | Admitting: Hematology and Oncology

## 2020-10-23 ENCOUNTER — Encounter: Payer: Self-pay | Admitting: Hematology and Oncology

## 2020-10-30 ENCOUNTER — Other Ambulatory Visit: Payer: Medicare Other

## 2020-10-30 ENCOUNTER — Ambulatory Visit: Payer: Medicare Other

## 2020-11-02 ENCOUNTER — Other Ambulatory Visit (HOSPITAL_COMMUNITY): Payer: Medicare Other

## 2020-11-09 ENCOUNTER — Ambulatory Visit (HOSPITAL_COMMUNITY)
Admission: RE | Admit: 2020-11-09 | Discharge: 2020-11-09 | Disposition: A | Discharge status: Home / Self Care | Payer: Medicare Other | Source: Ambulatory Visit | Attending: Hematology and Oncology | Admitting: Hematology and Oncology

## 2020-11-09 ENCOUNTER — Other Ambulatory Visit: Payer: Self-pay

## 2020-11-09 DIAGNOSIS — Z01810 Encounter for preprocedural cardiovascular examination: Secondary | ICD-10-CM | POA: Diagnosis not present

## 2020-11-09 DIAGNOSIS — Z79899 Other long term (current) drug therapy: Secondary | ICD-10-CM

## 2020-11-09 DIAGNOSIS — E785 Hyperlipidemia, unspecified: Secondary | ICD-10-CM | POA: Insufficient documentation

## 2020-11-09 DIAGNOSIS — Z0189 Encounter for other specified special examinations: Secondary | ICD-10-CM | POA: Diagnosis not present

## 2020-11-09 DIAGNOSIS — I119 Hypertensive heart disease without heart failure: Secondary | ICD-10-CM | POA: Diagnosis not present

## 2020-11-09 DIAGNOSIS — Z5181 Encounter for therapeutic drug level monitoring: Secondary | ICD-10-CM | POA: Diagnosis not present

## 2020-11-09 LAB — ECHOCARDIOGRAM COMPLETE
AR max vel: 2.55 cm2
AV Area VTI: 2.43 cm2
AV Area mean vel: 2.61 cm2
AV Mean grad: 3 mmHg
AV Peak grad: 6.1 mmHg
Ao pk vel: 1.23 m/s
Area-P 1/2: 4.49 cm2
Calc EF: 73.2 %
S' Lateral: 2.4 cm
Single Plane A2C EF: 72 %
Single Plane A4C EF: 74.7 %

## 2020-11-09 NOTE — Progress Notes (Signed)
*  PRELIMINARY RESULTS* Echocardiogram 2D Echocardiogram has been performed.  Luisa Hart RDCS 11/09/2020, 9:49 AM

## 2020-11-13 ENCOUNTER — Other Ambulatory Visit: Payer: Self-pay | Admitting: Family Medicine

## 2020-11-13 DIAGNOSIS — K219 Gastro-esophageal reflux disease without esophagitis: Secondary | ICD-10-CM

## 2020-11-14 ENCOUNTER — Other Ambulatory Visit: Payer: Self-pay | Admitting: Hematology and Oncology

## 2020-11-14 ENCOUNTER — Telehealth: Payer: Self-pay | Admitting: Hematology and Oncology

## 2020-11-14 NOTE — Telephone Encounter (Signed)
R/s appts per 7/1 sch msg. I spoke to pt's sister who said they would be out of town and their flight would not be back in time for the original appt on 7/20. MD did not have any slots open on 7/21 so I moved appts to 7/22 per pt's sister's request. I sent a msg to nurse letting her know of change and why it was made.

## 2020-11-21 ENCOUNTER — Other Ambulatory Visit: Payer: Self-pay | Admitting: Family Medicine

## 2020-11-21 ENCOUNTER — Other Ambulatory Visit: Payer: Self-pay | Admitting: *Deleted

## 2020-11-21 DIAGNOSIS — K219 Gastro-esophageal reflux disease without esophagitis: Secondary | ICD-10-CM

## 2020-11-24 ENCOUNTER — Other Ambulatory Visit: Payer: Self-pay | Admitting: Family Medicine

## 2020-11-24 DIAGNOSIS — K219 Gastro-esophageal reflux disease without esophagitis: Secondary | ICD-10-CM

## 2020-11-29 ENCOUNTER — Other Ambulatory Visit: Payer: Medicare Other

## 2020-11-29 ENCOUNTER — Ambulatory Visit: Payer: Medicare Other | Admitting: Hematology and Oncology

## 2020-11-29 ENCOUNTER — Other Ambulatory Visit: Payer: Self-pay

## 2020-11-29 ENCOUNTER — Ambulatory Visit: Payer: Medicare Other

## 2020-11-29 DIAGNOSIS — K219 Gastro-esophageal reflux disease without esophagitis: Secondary | ICD-10-CM

## 2020-11-29 MED ORDER — PANTOPRAZOLE SODIUM 40 MG PO TBEC: DELAYED_RELEASE_TABLET | ORAL | 0 refills | Status: DC

## 2020-11-30 NOTE — Progress Notes (Signed)
Patient Care Team: Janora Norlander, DO as PCP - General (Family Medicine) Danie Binder, MD (Inactive) (Gastroenterology) Alphonsa Overall, MD as Consulting Physician (General Surgery) Nicholas Lose, MD as Consulting Physician (Hematology and Oncology) Kyung Rudd, MD as Consulting Physician (Radiation Oncology) Mauro Kaufmann, RN as Oncology Nurse Navigator Rockwell Germany, RN as Oncology Nurse Navigator  DIAGNOSIS:    ICD-10-CM   1. Malignant neoplasm of upper-outer quadrant of right breast in female, estrogen receptor positive ([place])  C50.411    Z17.0     2. Gastroesophageal reflux disease without esophagitis  K21.9 pantoprazole (PROTONIX) 40 MG tablet      SUMMARY OF ONCOLOGIC HISTORY: Oncology History  Malignant neoplasm of upper-outer quadrant of right breast in female, estrogen receptor positive ([place])  03/18/2018 Initial Diagnosis   Screening detected bilateral breast abnormalities.  Right breast mass UOQ 2.1 cm at 10 o'clock position: Grade 1 ILC ER 70%, PR 0%, HER-2 +3+ by IHC, Ki-67 5%, suspected lymph node in the axilla could not be biopsied; left breast calcifications by ultrasound not visible but 5 cm cystic/tubular structure which on biopsy was fibrocystic change with ADHl; T2N0 stage IIa clinical stage    05/01/2018 Surgery   Bilateral mastectomies: Left mastectomy: DCIS intermediate grade 0.9 cm margins negative Tis NX stage 0; right mastectomy: ILC, grade 3, 3.5 cm, with LCIS, perineural invasion present, margins negative, 15/22 lymph nodes positive with extracapsular extension, ER 90%, PR 0%, HER-2 +(IHC 3+), Ki-67 5%, T2N3 Stage 3B     05/11/2018 Cancer Staging   Staging form: Breast, AJCC 8th Edition - Pathologic stage from 05/11/2018: Stage IIIB (pT2, pN3, cM0, G3, ER+, PR-, HER2+) - Signed by Nicholas Lose, MD on 05/11/2018    06/03/2018 Imaging   Bone scan: Increased tracer uptake in the lumbar spine at L1, L3 and L4, right fourth and seventh ribs,  inferior right scapula and distal sternum, T7 and T11 as well.   CT CAP showed widespread bone metastases with possible pathologic fracture at L3, peripheral right upper lobe nodules nonspecific    06/10/2018 -  Chemotherapy   Taxol Kingenti and Perjeta     10/27/2018 PET scan   Bone metastases less metabolically active.  Couple of lymph nodes with low-level metabolic activity noted.     CHIEF COMPLIANT: Follow-up of Kingenti maintenance and anastrozole  INTERVAL HISTORY: [name] is a 55 y.o. with above-mentioned history of metastatic breast cancer currently on treatment with maintenance Kingenti and on anti-estrogen therapy with anastrozole. She presents to the clinic today for treatment.  She is tolerating the treatment extremely well without any problems or concerns.  Recently she went down to [place] to [place] and came back.  ALLERGIES:  is allergic to hydrocodone.  MEDICATIONS:  Current Outpatient Medications  Medication Sig Dispense Refill   diphenoxylate-atropine (LOMOTIL) 2.5-0.025 MG tablet TAKE 1 TO 2 TABLETS BY MOUTH 4 TIMES A DAY AS NEEDED FOR DIARRHEA 40 tablet 1   ibuprofen (ADVIL,MOTRIN) 600 MG tablet Take 1 tablet (600 mg total) by mouth every 6 (six) hours as needed (for mild pain not relieved by other medications.). 30 tablet 0   lisinopril (ZESTRIL) 40 MG tablet TAKE 1 TABLET BY MOUTH EVERY DAY 90 tablet 3   megestrol (MEGACE) 40 MG tablet TAKE 3 TABLETS EVERY DAY 270 tablet 11   megestrol (MEGACE) 40 MG tablet TAKE 1 TABLET (40 MG TOTAL) BY MOUTH 2 (TWO) TIMES DAILY. TAKE 3 TABLETS EVERY DAY 180 tablet 3  ondansetron (ZOFRAN-ODT) 4 MG disintegrating tablet Take 1 tablet (4 mg total) by mouth every 6 (six) hours as needed for nausea. 15 tablet 0   pantoprazole (PROTONIX) 40 MG tablet TAKE 1 TABLET BY MOUTH 30 MINUTES PRIOR TO BREAKFAST AND SUPPER, 180 tablet 3   polyethylene glycol (MIRALAX / GLYCOLAX) packet Take 17 g by mouth daily as needed for moderate  constipation.     potassium chloride (KLOR-CON) 10 MEQ tablet TAKE 1 TABLET BY MOUTH EVERY DAY 30 tablet 1   pravastatin (PRAVACHOL) 40 MG tablet TAKE 1 TABLET BY MOUTH DAILY 90 tablet 3   tamoxifen (NOLVADEX) 20 MG tablet TAKE 1 TABLET BY MOUTH EVERY DAY 90 tablet 3   No current facility-administered medications for this visit.    PHYSICAL EXAMINATION: ECOG PERFORMANCE STATUS: 1 - Symptomatic but completely ambulatory  Vitals:   12/01/20 0948  BP: (!) 147/79  Pulse: 69  Resp: 18  Temp: 97.7 F (36.5 C)  SpO2: 100%   Filed Weights   12/01/20 0948  Weight: 130 lb 8 oz (59.2 kg)     LABORATORY DATA:  I have reviewed the data as listed CMP Latest Ref Rng & Units 10/18/2020 09/06/2020 07/26/2020  Glucose 70 - 99 mg/dL 145(H) 149(H) 149(H)  BUN 6 - 20 mg/dL _0 Creatinine 0.44 - 1.00 mg/dL 0.69 0.66 0.69  Sodium 135 - 145 mmol/L 145 144 140  Potassium 3.5 - 5.1 mmol/L 3.9 3.9 3.6  Chloride 98 - 111 mmol/L 111 109 110  CO2 22 - 32 mmol/L _1 Calcium 8.9 - 10.3 mg/dL 9.2 9.0 8.8(L)  Total Protein 6.5 - 8.1 g/dL 7.7 7.7 7.6  Total Bilirubin 0.3 - 1.2 mg/dL 0.3 0.3 0.4  Alkaline Phos 38 - 126 U/L 59 59 61  AST 15 - 41 U/L 13(L) 14(L) 12(L)  ALT 0 - 44 U/L _2 Lab Results  Component Value Date   WBC 7.4 12/01/2020   HGB 11.4 (L) 12/01/2020   HCT 34.9 (L) 12/01/2020   MCV 93.3 12/01/2020   PLT 274 12/01/2020   NEUTROABS 4.3 12/01/2020    ASSESSMENT & PLAN:  Malignant neoplasm of upper-outer quadrant of right breast in female, estrogen receptor positive ([place]) 05/01/18: Bilateral mastectomies: Left mastectomy: DCIS intermediate grade 0.9 cm margins negative Tis NX stage 0; right mastectomy: ILC, grade 3, 3.5 cm, with LCIS, perineural invasion present, margins negative, 15/22 lymph nodes positive with extracapsular extension, ER 90%, PR 0%, HER-2 +(IHC 3+), Ki-67 5%, T2N3M1 Stage IV   06/03/2018 bone scan: Increased tracer uptake in the lumbar spine at L1, L3  and L4, right fourth and seventh ribs, inferior right scapula and distal sternum, T7 and T11 as well.   CT CAP showed widespread bone metastases with possible pathologic fracture at L3, peripheral right upper lobe nodules nonspecific Patient has cerebral palsy and her sister is the power of attorney for healthcare Treatment plan: Palliative treatment with Taxol x12 cycles completed 08/26/2018, Kingenti, Perjeta ------------------------------------------------------------------------------------------------------------------------ PET CT scan: 10/27/2018: Widespread bone metastatic disease demonstrates reduced with sclerosis and low metabolic activity with SUV of 3-4.  Left level 4 lymph node 0.5 cm and left deep parotid lymph node SUV 4.1 will need follow-up.  Uterine leiomyomas.   Current treatment: Kingenti maintenance along with anastrozole   Anastrozole toxicities: Denies any hot flashes or myalgias. Kingenti toxicities: None Intermittent diarrhea   PET/CT 11/11/2019: Continued mild hypermetabolic activity left supraclavicular lymph node 0.7 cm, old  sclerotic bone lesions are stable and not hypermetabolic.  Uterine fibroids   CT CAP 08/23/20: No findings of active or progressive malignancy, remote innumerable osseus mets, left supra clav LN smaller  Next scan will be done in October 2022. Return to clinic every 6 weeks for Kingenti     No orders of the defined types were placed in this encounter.  The patient has a good understanding of the overall plan. she agrees with it. she will call with any problems that may develop before the next visit here.  Total time spent: 30 mins including face to face time and time spent for planning, charting and coordination of care  Rulon Eisenmenger, MD, MPH 12/01/2020  I, Thana Ates, am acting as scribe for Dr. Nicholas Lose.  I have reviewed the above documentation for accuracy and completeness, and I agree with the above.

## 2020-12-01 ENCOUNTER — Other Ambulatory Visit: Payer: Self-pay

## 2020-12-01 ENCOUNTER — Inpatient Hospital Stay (HOSPITAL_BASED_OUTPATIENT_CLINIC_OR_DEPARTMENT_OTHER): Payer: Medicare Other | Admitting: Hematology and Oncology

## 2020-12-01 ENCOUNTER — Inpatient Hospital Stay: Payer: Medicare Other

## 2020-12-01 ENCOUNTER — Inpatient Hospital Stay: Payer: Medicare Other | Attending: Hematology and Oncology

## 2020-12-01 DIAGNOSIS — C50411 Malignant neoplasm of upper-outer quadrant of right female breast: Secondary | ICD-10-CM | POA: Diagnosis not present

## 2020-12-01 DIAGNOSIS — Z5112 Encounter for antineoplastic immunotherapy: Secondary | ICD-10-CM | POA: Insufficient documentation

## 2020-12-01 DIAGNOSIS — Z170 Estrogen receptor positive status [ER+]: Secondary | ICD-10-CM

## 2020-12-01 DIAGNOSIS — C7951 Secondary malignant neoplasm of bone: Secondary | ICD-10-CM | POA: Insufficient documentation

## 2020-12-01 DIAGNOSIS — Z95828 Presence of other vascular implants and grafts: Secondary | ICD-10-CM

## 2020-12-01 DIAGNOSIS — K219 Gastro-esophageal reflux disease without esophagitis: Secondary | ICD-10-CM | POA: Diagnosis not present

## 2020-12-01 DIAGNOSIS — Z7189 Other specified counseling: Secondary | ICD-10-CM

## 2020-12-01 LAB — CMP (CANCER CENTER ONLY)
ALT: 20 U/L (ref 0–44)
AST: 15 U/L (ref 15–41)
Albumin: 3.8 g/dL (ref 3.5–5.0)
Alkaline Phosphatase: 51 U/L (ref 38–126)
Anion gap: 8 (ref 5–15)
BUN: 17 mg/dL (ref 6–20)
CO2: 24 mmol/L (ref 22–32)
Calcium: 9.1 mg/dL (ref 8.9–10.3)
Chloride: 111 mmol/L (ref 98–111)
Creatinine: 0.52 mg/dL (ref 0.44–1.00)
GFR, Estimated: >60 mL/min (ref >60)
Glucose, Bld: 180 mg/dL — ABNORMAL HIGH (ref 70–99)
Potassium: 3.7 mmol/L (ref 3.5–5.1)
Sodium: 143 mmol/L (ref 135–145)
Total Bilirubin: 0.3 mg/dL (ref 0.3–1.2)
Total Protein: 7.3 g/dL (ref 6.5–8.1)

## 2020-12-01 LAB — CBC WITH DIFFERENTIAL (CANCER CENTER ONLY)
Abs Immature Granulocytes: 0.03 10*3/uL (ref 0.00–0.07)
Basophils Absolute: 0.1 10*3/uL (ref 0.0–0.1)
Basophils Relative: 1 %
Eosinophils Absolute: 0.1 10*3/uL (ref 0.0–0.5)
Eosinophils Relative: 2 %
HCT: 34.9 % — ABNORMAL LOW (ref 36.0–46.0)
Hemoglobin: 11.4 g/dL — ABNORMAL LOW (ref 12.0–15.0)
Immature Granulocytes: 0 %
Lymphocytes Relative: 33 %
Lymphs Abs: 2.4 10*3/uL (ref 0.7–4.0)
MCH: 30.5 pg (ref 26.0–34.0)
MCHC: 32.7 g/dL (ref 30.0–36.0)
MCV: 93.3 fL (ref 80.0–100.0)
Monocytes Absolute: 0.4 10*3/uL (ref 0.1–1.0)
Monocytes Relative: 6 %
Neutro Abs: 4.3 10*3/uL (ref 1.7–7.7)
Neutrophils Relative %: 58 %
Platelet Count: 274 10*3/uL (ref 150–400)
RBC: 3.74 MIL/uL — ABNORMAL LOW (ref 3.87–5.11)
RDW: 13.3 % (ref 11.5–15.5)
WBC Count: 7.4 10*3/uL (ref 4.0–10.5)
nRBC: 0 % (ref 0.0–0.2)

## 2020-12-01 MED ORDER — SODIUM CHLORIDE 0.9% FLUSH
10.0000 mL | INTRAVENOUS | Status: DC | PRN
  Administered 2020-12-01: 10 mL
  Filled 2020-12-01: qty 10

## 2020-12-01 MED ORDER — TRASTUZUMAB-ANNS CHEMO 150 MG IV SOLR
6.0000 mg/kg | Freq: Once | INTRAVENOUS | Status: AC
  Administered 2020-12-01: 336 mg via INTRAVENOUS
  Filled 2020-12-01: qty 16

## 2020-12-01 MED ORDER — DIPHENHYDRAMINE HCL 25 MG PO CAPS
25.0000 mg | ORAL_CAPSULE | Freq: Once | ORAL | Status: AC
  Administered 2020-12-01: 25 mg via ORAL

## 2020-12-01 MED ORDER — PANTOPRAZOLE SODIUM 40 MG PO TBEC: DELAYED_RELEASE_TABLET | ORAL | 3 refills | Status: DC

## 2020-12-01 MED ORDER — ACETAMINOPHEN 325 MG PO TABS
650.0000 mg | ORAL_TABLET | Freq: Once | ORAL | Status: AC
  Administered 2020-12-01: 650 mg via ORAL

## 2020-12-01 MED ORDER — SODIUM CHLORIDE 0.9 % IV SOLN
Freq: Once | INTRAVENOUS | Status: AC
  Filled 2020-12-01: qty 250

## 2020-12-01 MED ORDER — HEPARIN SOD (PORK) LOCK FLUSH 100 UNIT/ML IV SOLN
500.0000 [IU] | Freq: Once | INTRAVENOUS | Status: AC | PRN
  Administered 2020-12-01: 500 [IU]
  Filled 2020-12-01: qty 5

## 2020-12-01 NOTE — Patient Instructions (Signed)
[facility]  Discharge Instructions: Thank you for choosing [facility] to provide your oncology and hematology care.   If you have a lab appointment with the [facility], please go directly to the [facility] and check in at the registration area.   Wear comfortable clothing and clothing appropriate for easy access to any Portacath or PICC line.   We strive to give you quality time with your provider. You may need to reschedule your appointment if you arrive late (15 or more minutes).  Arriving late affects you and other patients whose appointments are after yours.  Also, if you miss three or more appointments without notifying the office, you may be dismissed from the clinic at the provider's discretion.      For prescription refill requests, have your pharmacy contact our office and allow 72 hours for refills to be completed.    Today you received the following chemotherapy and/or immunotherapy agents: Trastuzumab (Kanjinti)     To help prevent nausea and vomiting after your treatment, we encourage you to take your nausea medication as directed.  BELOW ARE SYMPTOMS THAT SHOULD BE REPORTED IMMEDIATELY: *FEVER GREATER THAN 100.4 F (38 C) OR HIGHER *CHILLS OR SWEATING *NAUSEA AND VOMITING THAT IS NOT CONTROLLED WITH YOUR NAUSEA MEDICATION *UNUSUAL SHORTNESS OF BREATH *UNUSUAL BRUISING OR BLEEDING *URINARY PROBLEMS (pain or burning when urinating, or frequent urination) *BOWEL PROBLEMS (unusual diarrhea, constipation, pain near the anus) TENDERNESS IN MOUTH AND THROAT WITH OR WITHOUT PRESENCE OF ULCERS (sore throat, sores in mouth, or a toothache) UNUSUAL RASH, SWELLING OR PAIN  UNUSUAL VAGINAL DISCHARGE OR ITCHING   Items with * indicate a potential emergency and should be followed up as soon as possible or go to the Emergency Department if any problems should occur.  Please show the CHEMOTHERAPY ALERT CARD or IMMUNOTHERAPY ALERT CARD at  check-in to the Emergency Department and triage nurse.  Should you have questions after your visit or need to cancel or reschedule your appointment, please contact [facility]  Dept: [phone]  and follow the prompts.  Office hours are 8:00 a.m. to 4:30 p.m. Monday - Friday. Please note that voicemails left after 4:00 p.m. may not be returned until the following business day.  We are closed weekends and major holidays. You have access to a nurse at all times for urgent questions. Please call the main number to the clinic Dept: [phone] and follow the prompts.   For any non-urgent questions, you may also contact your provider using MyChart. We now offer e-Visits for anyone 38 and older to request care online for non-urgent symptoms. For details visit [email or web address].   Also download the MyChart app! Go to the app store, search "MyChart", open the app, select [facility], and log in with your MyChart username and password.  Due to Covid, a mask is required upon entering the hospital/clinic. If you do not have a mask, one will be given to you upon arrival. For doctor visits, patients may have 1 support person aged 65 or older with them. For treatment visits, patients cannot have anyone with them due to current Covid guidelines and our immunocompromised population.

## 2020-12-01 NOTE — Assessment & Plan Note (Signed)
05/01/18:Bilateral mastectomies: Left mastectomy: DCIS intermediate grade 0.9 cm margins negative Tis NX stage 0; right mastectomy: ILC, grade 3, 3.5 cm, with LCIS, perineural invasion present, margins negative, 15/22 lymph nodes positive with extracapsular extension, ER 90%, PR 0%, HER-2 +(IHC 3+), Ki-67 5%, T2N3M1Stage IV  06/03/2018 bone scan: Increased tracer uptake in the lumbar spine at L1, L3 and L4, right fourth and seventh ribs, inferior right scapula and distal sternum, T7 and T11 as well.  CT CAPshowed widespread bone metastases with possible pathologic fracture at L3, peripheral right upper lobe nodules nonspecific Patient has cerebral palsy and hersisteris the power of attorney for healthcare Treatment plan: Palliative treatment with Taxolx12 cycles completed 08/26/2018,Kingenti, Perjeta ------------------------------------------------------------------------------------------------------------------------ PET CT scan:10/27/2018:Widespread bone metastatic disease demonstrates reduced with sclerosis and low metabolic activity with SUV of 3-4. Left level 4 lymph node 0.5 cm and left deep parotid lymph node SUV 4.1 will need follow-up. Uterine leiomyomas.  Current treatment:Kingenti maintenance along with anastrozole  Anastrozole toxicities: Denies any hot flashes or myalgias. Kingenti toxicities: None Intermittent diarrhea  PET/CT 11/11/2019: Continued mild hypermetabolic activity left supraclavicular lymph node 0.7 cm, old sclerotic bone lesions are stable and not hypermetabolic. Uterine fibroids  CT CAP 08/23/20: No findings of active or progressive malignancy, remote innumerable osseus mets, left supra clav LN smaller Nextscan will be done in October 2022. Return to clinic every6 weeks for Kingenti 

## 2020-12-11 ENCOUNTER — Other Ambulatory Visit: Payer: Self-pay | Admitting: Hematology and Oncology

## 2020-12-11 ENCOUNTER — Other Ambulatory Visit: Payer: Medicare Other

## 2020-12-11 ENCOUNTER — Ambulatory Visit: Payer: Medicare Other | Admitting: Hematology and Oncology

## 2020-12-11 ENCOUNTER — Ambulatory Visit: Payer: Medicare Other

## 2021-01-07 ENCOUNTER — Other Ambulatory Visit: Payer: Self-pay | Admitting: Hematology and Oncology

## 2021-01-08 ENCOUNTER — Encounter: Payer: Self-pay | Admitting: Hematology and Oncology

## 2021-01-09 ENCOUNTER — Telehealth: Payer: Self-pay | Admitting: Hematology and Oncology

## 2021-01-09 NOTE — Telephone Encounter (Signed)
R/s appts per 8/29 sch msg. Pt's sister is aware.

## 2021-01-10 ENCOUNTER — Other Ambulatory Visit: Payer: Medicare Other

## 2021-01-10 ENCOUNTER — Inpatient Hospital Stay: Payer: Medicare Other | Attending: Hematology and Oncology

## 2021-01-10 ENCOUNTER — Ambulatory Visit: Payer: Medicare Other | Admitting: Hematology and Oncology

## 2021-01-10 ENCOUNTER — Inpatient Hospital Stay: Payer: Medicare Other

## 2021-01-10 ENCOUNTER — Other Ambulatory Visit: Payer: Self-pay

## 2021-01-10 VITALS — BP 118/80 | HR 80 | Temp 98.8°F | Resp 17

## 2021-01-10 DIAGNOSIS — Z170 Estrogen receptor positive status [ER+]: Secondary | ICD-10-CM | POA: Diagnosis not present

## 2021-01-10 DIAGNOSIS — Z5112 Encounter for antineoplastic immunotherapy: Secondary | ICD-10-CM | POA: Diagnosis not present

## 2021-01-10 DIAGNOSIS — C7951 Secondary malignant neoplasm of bone: Secondary | ICD-10-CM | POA: Insufficient documentation

## 2021-01-10 DIAGNOSIS — C50411 Malignant neoplasm of upper-outer quadrant of right female breast: Secondary | ICD-10-CM | POA: Diagnosis not present

## 2021-01-10 DIAGNOSIS — Z7189 Other specified counseling: Secondary | ICD-10-CM

## 2021-01-10 DIAGNOSIS — Z95828 Presence of other vascular implants and grafts: Secondary | ICD-10-CM

## 2021-01-10 LAB — CBC WITH DIFFERENTIAL (CANCER CENTER ONLY)
Abs Immature Granulocytes: 0.01 10*3/uL (ref 0.00–0.07)
Basophils Absolute: 0 10*3/uL (ref 0.0–0.1)
Basophils Relative: 1 %
Eosinophils Absolute: 0.1 10*3/uL (ref 0.0–0.5)
Eosinophils Relative: 2 %
HCT: 35.5 % — ABNORMAL LOW (ref 36.0–46.0)
Hemoglobin: 12 g/dL (ref 12.0–15.0)
Immature Granulocytes: 0 %
Lymphocytes Relative: 34 %
Lymphs Abs: 2.5 10*3/uL (ref 0.7–4.0)
MCH: 30.8 pg (ref 26.0–34.0)
MCHC: 33.8 g/dL (ref 30.0–36.0)
MCV: 91.3 fL (ref 80.0–100.0)
Monocytes Absolute: 0.4 10*3/uL (ref 0.1–1.0)
Monocytes Relative: 5 %
Neutro Abs: 4.4 10*3/uL (ref 1.7–7.7)
Neutrophils Relative %: 58 %
Platelet Count: 318 10*3/uL (ref 150–400)
RBC: 3.89 MIL/uL (ref 3.87–5.11)
RDW: 13.2 % (ref 11.5–15.5)
WBC Count: 7.4 10*3/uL (ref 4.0–10.5)
nRBC: 0 % (ref 0.0–0.2)

## 2021-01-10 LAB — CMP (CANCER CENTER ONLY)
ALT: 16 U/L (ref 0–44)
AST: 15 U/L (ref 15–41)
Albumin: 3.7 g/dL (ref 3.5–5.0)
Alkaline Phosphatase: 58 U/L (ref 38–126)
Anion gap: 11 (ref 5–15)
BUN: 11 mg/dL (ref 6–20)
CO2: 23 mmol/L (ref 22–32)
Calcium: 9.1 mg/dL (ref 8.9–10.3)
Chloride: 109 mmol/L (ref 98–111)
Creatinine: 0.71 mg/dL (ref 0.44–1.00)
GFR, Estimated: >60 mL/min (ref >60)
Glucose, Bld: 125 mg/dL — ABNORMAL HIGH (ref 70–99)
Potassium: 3.3 mmol/L — ABNORMAL LOW (ref 3.5–5.1)
Sodium: 143 mmol/L (ref 135–145)
Total Bilirubin: 0.3 mg/dL (ref 0.3–1.2)
Total Protein: 7.6 g/dL (ref 6.5–8.1)

## 2021-01-10 MED ORDER — HEPARIN SOD (PORK) LOCK FLUSH 100 UNIT/ML IV SOLN
500.0000 [IU] | Freq: Once | INTRAVENOUS | Status: AC | PRN
Start: 2021-01-10 — End: 2021-01-10
  Administered 2021-01-10: 500 [IU]

## 2021-01-10 MED ORDER — ACETAMINOPHEN 325 MG PO TABS
650.0000 mg | ORAL_TABLET | Freq: Once | ORAL | Status: AC
  Administered 2021-01-10: 650 mg via ORAL
  Filled 2021-01-10: qty 2

## 2021-01-10 MED ORDER — SODIUM CHLORIDE 0.9 % IV SOLN: Freq: Once | INTRAVENOUS | Status: AC

## 2021-01-10 MED ORDER — DIPHENHYDRAMINE HCL 25 MG PO CAPS
25.0000 mg | ORAL_CAPSULE | Freq: Once | ORAL | Status: AC
  Administered 2021-01-10: 25 mg via ORAL
  Filled 2021-01-10: qty 1

## 2021-01-10 MED ORDER — SODIUM CHLORIDE 0.9% FLUSH
10.0000 mL | INTRAVENOUS | Status: DC | PRN
  Administered 2021-01-10: 10 mL

## 2021-01-10 MED ORDER — TRASTUZUMAB-ANNS CHEMO 150 MG IV SOLR
6.0000 mg/kg | Freq: Once | INTRAVENOUS | Status: AC
  Administered 2021-01-10: 336 mg via INTRAVENOUS
  Filled 2021-01-10: qty 16

## 2021-01-24 ENCOUNTER — Inpatient Hospital Stay: Payer: Medicare Other

## 2021-02-01 ENCOUNTER — Other Ambulatory Visit: Payer: Self-pay | Admitting: Hematology and Oncology

## 2021-02-20 ENCOUNTER — Other Ambulatory Visit: Payer: Self-pay | Admitting: Hematology and Oncology

## 2021-02-21 ENCOUNTER — Other Ambulatory Visit: Payer: Self-pay

## 2021-02-21 ENCOUNTER — Inpatient Hospital Stay: Payer: Medicare Other | Attending: Hematology and Oncology

## 2021-02-21 ENCOUNTER — Inpatient Hospital Stay: Payer: Medicare Other

## 2021-02-21 VITALS — BP 152/84 | HR 83 | Temp 97.5°F | Resp 18

## 2021-02-21 DIAGNOSIS — C50411 Malignant neoplasm of upper-outer quadrant of right female breast: Secondary | ICD-10-CM

## 2021-02-21 DIAGNOSIS — Z170 Estrogen receptor positive status [ER+]: Secondary | ICD-10-CM | POA: Insufficient documentation

## 2021-02-21 DIAGNOSIS — Z7189 Other specified counseling: Secondary | ICD-10-CM

## 2021-02-21 DIAGNOSIS — Z23 Encounter for immunization: Secondary | ICD-10-CM

## 2021-02-21 DIAGNOSIS — Z95828 Presence of other vascular implants and grafts: Secondary | ICD-10-CM

## 2021-02-21 DIAGNOSIS — C7951 Secondary malignant neoplasm of bone: Secondary | ICD-10-CM | POA: Diagnosis not present

## 2021-02-21 DIAGNOSIS — Z5112 Encounter for antineoplastic immunotherapy: Secondary | ICD-10-CM | POA: Insufficient documentation

## 2021-02-21 LAB — CBC WITH DIFFERENTIAL (CANCER CENTER ONLY)
Abs Immature Granulocytes: 0.01 10*3/uL (ref 0.00–0.07)
Basophils Absolute: 0 10*3/uL (ref 0.0–0.1)
Basophils Relative: 1 %
Eosinophils Absolute: 0.1 10*3/uL (ref 0.0–0.5)
Eosinophils Relative: 1 %
HCT: 34.8 % — ABNORMAL LOW (ref 36.0–46.0)
Hemoglobin: 11.5 g/dL — ABNORMAL LOW (ref 12.0–15.0)
Immature Granulocytes: 0 %
Lymphocytes Relative: 24 %
Lymphs Abs: 2 10*3/uL (ref 0.7–4.0)
MCH: 30.3 pg (ref 26.0–34.0)
MCHC: 33 g/dL (ref 30.0–36.0)
MCV: 91.8 fL (ref 80.0–100.0)
Monocytes Absolute: 0.5 10*3/uL (ref 0.1–1.0)
Monocytes Relative: 6 %
Neutro Abs: 5.9 10*3/uL (ref 1.7–7.7)
Neutrophils Relative %: 68 %
Platelet Count: 293 10*3/uL (ref 150–400)
RBC: 3.79 MIL/uL — ABNORMAL LOW (ref 3.87–5.11)
RDW: 13.3 % (ref 11.5–15.5)
WBC Count: 8.5 10*3/uL (ref 4.0–10.5)
nRBC: 0 % (ref 0.0–0.2)

## 2021-02-21 LAB — CMP (CANCER CENTER ONLY)
ALT: 17 U/L (ref 0–44)
AST: 13 U/L — ABNORMAL LOW (ref 15–41)
Albumin: 3.7 g/dL (ref 3.5–5.0)
Alkaline Phosphatase: 48 U/L (ref 38–126)
Anion gap: 6 (ref 5–15)
BUN: 19 mg/dL (ref 6–20)
CO2: 26 mmol/L (ref 22–32)
Calcium: 9.1 mg/dL (ref 8.9–10.3)
Chloride: 113 mmol/L — ABNORMAL HIGH (ref 98–111)
Creatinine: 0.58 mg/dL (ref 0.44–1.00)
GFR, Estimated: >60 mL/min (ref >60)
Glucose, Bld: 177 mg/dL — ABNORMAL HIGH (ref 70–99)
Potassium: 4 mmol/L (ref 3.5–5.1)
Sodium: 145 mmol/L (ref 135–145)
Total Bilirubin: 0.2 mg/dL — ABNORMAL LOW (ref 0.3–1.2)
Total Protein: 7.6 g/dL (ref 6.5–8.1)

## 2021-02-21 MED ORDER — SODIUM CHLORIDE 0.9 % IV SOLN
Freq: Once | INTRAVENOUS | Status: AC
Start: 2021-02-21 — End: 2021-02-21

## 2021-02-21 MED ORDER — INFLUENZA VAC SPLIT QUAD 0.5 ML IM SUSY
0.5000 mL | PREFILLED_SYRINGE | Freq: Once | INTRAMUSCULAR | Status: AC
  Administered 2021-02-21: 0.5 mL via INTRAMUSCULAR
  Filled 2021-02-21: qty 0.5

## 2021-02-21 MED ORDER — TRASTUZUMAB-ANNS CHEMO 150 MG IV SOLR
6.0000 mg/kg | Freq: Once | INTRAVENOUS | Status: AC
  Administered 2021-02-21: 336 mg via INTRAVENOUS
  Filled 2021-02-21: qty 16

## 2021-02-21 MED ORDER — ACETAMINOPHEN 325 MG PO TABS
650.0000 mg | ORAL_TABLET | Freq: Once | ORAL | Status: AC
  Administered 2021-02-21: 650 mg via ORAL
  Filled 2021-02-21: qty 2

## 2021-02-21 MED ORDER — HEPARIN SOD (PORK) LOCK FLUSH 100 UNIT/ML IV SOLN
500.0000 [IU] | Freq: Once | INTRAVENOUS | Status: AC | PRN
  Administered 2021-02-21: 500 [IU]

## 2021-02-21 MED ORDER — DIPHENHYDRAMINE HCL 25 MG PO CAPS
25.0000 mg | ORAL_CAPSULE | Freq: Once | ORAL | Status: AC
Start: 2021-02-21 — End: 2021-02-21
  Administered 2021-02-21: 25 mg via ORAL
  Filled 2021-02-21: qty 1

## 2021-02-21 MED ORDER — SODIUM CHLORIDE 0.9% FLUSH
10.0000 mL | INTRAVENOUS | Status: DC | PRN
  Administered 2021-02-21: 10 mL

## 2021-02-21 MED ORDER — SODIUM CHLORIDE 0.9% FLUSH
10.0000 mL | INTRAVENOUS | Status: DC | PRN
Start: 2021-02-21 — End: 2021-02-21
  Administered 2021-02-21: 10 mL

## 2021-02-21 NOTE — Patient Instructions (Signed)
[facility]   ?Discharge Instructions: ?Thank you for choosing [facility] to provide your oncology and hematology care.  ? ?If you have a lab appointment with the Cancer Center, please go directly to the Cancer Center and check in at the registration area. ?  ?Wear comfortable clothing and clothing appropriate for easy access to any Portacath or PICC line.  ? ?We strive to give you quality time with your provider. You may need to reschedule your appointment if you arrive late (15 or more minutes).  Arriving late affects you and other patients whose appointments are after yours.  Also, if you miss three or more appointments without notifying the office, you may be dismissed from the clinic at the provider?s discretion.    ?  ?For prescription refill requests, have your pharmacy contact our office and allow 72 hours for refills to be completed.   ? ?Today you received the following chemotherapy and/or immunotherapy agents: trastuzumab-anns    ?  ?To help prevent nausea and vomiting after your treatment, we encourage you to take your nausea medication as directed. ? ?BELOW ARE SYMPTOMS THAT SHOULD BE REPORTED IMMEDIATELY: ?*FEVER GREATER THAN 100.4 F (38 ?C) OR HIGHER ?*CHILLS OR SWEATING ?*NAUSEA AND VOMITING THAT IS NOT CONTROLLED WITH YOUR NAUSEA MEDICATION ?*UNUSUAL SHORTNESS OF BREATH ?*UNUSUAL BRUISING OR BLEEDING ?*URINARY PROBLEMS (pain or burning when urinating, or frequent urination) ?*BOWEL PROBLEMS (unusual diarrhea, constipation, pain near the anus) ?TENDERNESS IN MOUTH AND THROAT WITH OR WITHOUT PRESENCE OF ULCERS (sore throat, sores in mouth, or a toothache) ?UNUSUAL RASH, SWELLING OR PAIN  ?UNUSUAL VAGINAL DISCHARGE OR ITCHING  ? ?Items with * indicate a potential emergency and should be followed up as soon as possible or go to the Emergency Department if any problems should occur. ? ?Please show the CHEMOTHERAPY ALERT CARD or IMMUNOTHERAPY ALERT CARD at  check-in to the Emergency Department and triage nurse. ? ?Should you have questions after your visit or need to cancel or reschedule your appointment, please contact [facility]  Dept: [phone]  and follow the prompts.  Office hours are 8:00 a.m. to 4:30 p.m. Monday - Friday. Please note that voicemails left after 4:00 p.m. may not be returned until the following business day.  We are closed weekends and major holidays. You have access to a nurse at all times for urgent questions. Please call the main number to the clinic Dept: [phone] and follow the prompts. ? ? ?For any non-urgent questions, you may also contact your provider using MyChart. We now offer e-Visits for anyone 18 and older to request care online for non-urgent symptoms. For details visit [email or web address]. ?  ?Also download the MyChart app! Go to the app store, search "MyChart", open the app, select [facility], and log in with your MyChart username and password. ? ?Due to Covid, a mask is required upon entering the hospital/clinic. If you do not have a mask, one will be given to you upon arrival. For doctor visits, patients may have 1 support person aged 18 or older with them. For treatment visits, patients cannot have anyone with them due to current Covid guidelines and our immunocompromised population.  ? ?

## 2021-02-26 ENCOUNTER — Other Ambulatory Visit: Payer: Self-pay | Admitting: Hematology and Oncology

## 2021-03-07 ENCOUNTER — Other Ambulatory Visit: Payer: Medicare Other

## 2021-03-07 ENCOUNTER — Ambulatory Visit: Payer: Medicare Other

## 2021-03-07 ENCOUNTER — Ambulatory Visit: Payer: Medicare Other | Admitting: Hematology and Oncology

## 2021-03-15 ENCOUNTER — Other Ambulatory Visit: Payer: Self-pay | Admitting: Hematology and Oncology

## 2021-03-24 ENCOUNTER — Other Ambulatory Visit: Payer: Self-pay | Admitting: Hematology and Oncology

## 2021-03-26 ENCOUNTER — Encounter: Payer: Self-pay | Admitting: Hematology and Oncology

## 2021-04-03 NOTE — Progress Notes (Addendum)
Patient Care Team: Janora Norlander, DO as PCP - General (Family Medicine) Danie Binder, MD (Inactive) (Gastroenterology) Alphonsa Overall, MD as Consulting Physician (General Surgery) Nicholas Lose, MD as Consulting Physician (Hematology and Oncology) Kyung Rudd, MD as Consulting Physician (Radiation Oncology) Mauro Kaufmann, RN as Oncology Nurse Navigator Rockwell Germany, RN as Oncology Nurse Navigator  DIAGNOSIS:    ICD-10-CM   1. Malignant neoplasm of upper-outer quadrant of right breast in female, estrogen receptor positive ([facility])  C50.411    Z17.0       SUMMARY OF ONCOLOGIC HISTORY: Oncology History  Malignant neoplasm of upper-outer quadrant of right breast in female, estrogen receptor positive ([place])  03/18/2018 Initial Diagnosis   Screening detected bilateral breast abnormalities.  Right breast mass UOQ 2.1 cm at 10 o'clock position: Grade 1 ILC ER 70%, PR 0%, HER-2 +3+ by IHC, Ki-67 5%, suspected lymph node in the axilla could not be biopsied; left breast calcifications by ultrasound not visible but 5 cm cystic/tubular structure which on biopsy was fibrocystic change with ADHl; T2N0 stage IIa clinical stage   05/01/2018 Surgery   Bilateral mastectomies: Left mastectomy: DCIS intermediate grade 0.9 cm margins negative Tis NX stage 0; right mastectomy: ILC, grade 3, 3.5 cm, with LCIS, perineural invasion present, margins negative, 15/22 lymph nodes positive with extracapsular extension, ER 90%, PR 0%, HER-2 +(IHC 3+), Ki-67 5%, T2N3 Stage 3B    05/11/2018 Cancer Staging   Staging form: Breast, AJCC 8th Edition - Pathologic stage from 05/11/2018: Stage IIIB (pT2, pN3, cM0, G3, ER+, PR-, HER2+) - Signed by Nicholas Lose, MD on 05/11/2018    06/03/2018 Imaging   Bone scan: Increased tracer uptake in the lumbar spine at L1, L3 and L4, right fourth and seventh ribs, inferior right scapula and distal sternum, T7 and T11 as well.   CT CAP showed widespread bone metastases with  possible pathologic fracture at L3, peripheral right upper lobe nodules nonspecific   06/10/2018 -  Chemotherapy   Taxol Kingenti and Perjeta    10/27/2018 PET scan   Bone metastases less metabolically active.  Couple of lymph nodes with low-level metabolic activity noted.     CHIEF COMPLIANT: Follow-up of metastatic breast cancer  INTERVAL HISTORY: [name] is a 55 y.o. with above-mentioned history of of metastatic breast cancer currently on treatment with maintenance Kingenti and on anti-estrogen therapy with anastrozole. She presents to the clinic today for treatment.   ALLERGIES:  is allergic to hydrocodone.  MEDICATIONS:  Current Outpatient Medications  Medication Sig Dispense Refill   diphenoxylate-atropine (LOMOTIL) 2.5-0.025 MG tablet TAKE 1 TO 2 TABLETS BY MOUTH 4 TIMES A DAY AS NEEDED FOR DIARRHEA 40 tablet 1   ibuprofen (ADVIL,MOTRIN) 600 MG tablet Take 1 tablet (600 mg total) by mouth every 6 (six) hours as needed (for mild pain not relieved by other medications.). 30 tablet 0   lisinopril (ZESTRIL) 40 MG tablet TAKE 1 TABLET BY MOUTH EVERY DAY 90 tablet 3   megestrol (MEGACE) 40 MG tablet TAKE 3 TABLETS EVERY DAY 270 tablet 11   megestrol (MEGACE) 40 MG tablet TAKE 1 TABLET (40 MG TOTAL) BY MOUTH 2 (TWO) TIMES DAILY. TAKE 3 TABLETS EVERY DAY 180 tablet 3   ondansetron (ZOFRAN-ODT) 4 MG disintegrating tablet Take 1 tablet (4 mg total) by mouth every 6 (six) hours as needed for nausea. 15 tablet 0   pantoprazole (PROTONIX) 40 MG tablet TAKE 1 TABLET BY MOUTH 30 MINUTES PRIOR TO BREAKFAST AND SUPPER,  180 tablet 3   polyethylene glycol (MIRALAX / GLYCOLAX) packet Take 17 g by mouth daily as needed for moderate constipation.     potassium chloride (KLOR-CON) 10 MEQ tablet TAKE 1 TABLET BY MOUTH EVERY DAY 30 tablet 1   pravastatin (PRAVACHOL) 40 MG tablet TAKE 1 TABLET BY MOUTH DAILY 90 tablet 3   tamoxifen (NOLVADEX) 20 MG tablet TAKE 1 TABLET BY MOUTH EVERY DAY 90 tablet 3    No current facility-administered medications for this visit.    PHYSICAL EXAMINATION: ECOG PERFORMANCE STATUS: 1 - Symptomatic but completely ambulatory  Vitals:   04/04/21 1109  BP: 131/63  Pulse: 84  Resp: 17  Temp: 98.1 F (36.7 C)  SpO2: 99%   Filed Weights   04/04/21 1109  Weight: 120 lb 12.8 oz (54.8 kg)      LABORATORY DATA:  I have reviewed the data as listed CMP Latest Ref Rng & Units 02/21/2021 01/10/2021 12/01/2020  Glucose 70 - 99 mg/dL 177(H) 125(H) 180(H)  BUN 6 - 20 mg/dL _0 Creatinine 0.44 - 1.00 mg/dL 0.58 0.71 0.52  Sodium 135 - 145 mmol/L 145 143 143  Potassium 3.5 - 5.1 mmol/L 4.0 3.3(L) 3.7  Chloride 98 - 111 mmol/L 113(H) 109 111  CO2 22 - 32 mmol/L _1 Calcium 8.9 - 10.3 mg/dL 9.1 9.1 9.1  Total Protein 6.5 - 8.1 g/dL 7.6 7.6 7.3  Total Bilirubin 0.3 - 1.2 mg/dL 0.2(L) 0.3 0.3  Alkaline Phos 38 - 126 U/L 48 58 51  AST 15 - 41 U/L 13(L) 15 15  ALT 0 - 44 U/L _2 Lab Results  Component Value Date   WBC 7.7 04/04/2021   HGB 12.1 04/04/2021   HCT 36.9 04/04/2021   MCV 90.9 04/04/2021   PLT 287 04/04/2021   NEUTROABS 4.6 04/04/2021    ASSESSMENT & PLAN:  Malignant neoplasm of upper-outer quadrant of right breast in female, estrogen receptor positive ([place]) 05/01/18: Bilateral mastectomies: Left mastectomy: DCIS intermediate grade 0.9 cm margins negative Tis NX stage 0; right mastectomy: ILC, grade 3, 3.5 cm, with LCIS, perineural invasion present, margins negative, 15/22 lymph nodes positive with extracapsular extension, ER 90%, PR 0%, HER-2 +(IHC 3+), Ki-67 5%, T2N3M1 Stage IV   06/03/2018 bone scan: Increased tracer uptake in the lumbar spine at L1, L3 and L4, right fourth and seventh ribs, inferior right scapula and distal sternum, T7 and T11 as well.   CT CAP showed widespread bone metastases with possible pathologic fracture at L3, peripheral right upper lobe nodules nonspecific Patient has cerebral palsy and her  sister is the power of attorney for healthcare Treatment plan: Palliative treatment with Taxol x12 cycles completed 08/26/2018, Kingenti, Perjeta ------------------------------------------------------------------------------------------------------------------------ PET CT scan: 10/27/2018: Widespread bone metastatic disease demonstrates reduced with sclerosis and low metabolic activity with SUV of 3-4.  Left level 4 lymph node 0.5 cm and left deep parotid lymph node SUV 4.1 will need follow-up.  Uterine leiomyomas.   Current treatment: Kingenti maintenance along with anastrozole   Anastrozole toxicities: Denies any hot flashes or myalgias. Kingenti toxicities: None Intermittent diarrhea   PET/CT 11/11/2019: Continued mild hypermetabolic activity left supraclavicular lymph node 0.7 cm, old sclerotic bone lesions are stable and not hypermetabolic.  Uterine fibroids   CT CAP 08/23/20: No findings of active or progressive malignancy, remote innumerable osseus mets, left supra clav LN smaller  Next scan will be done in December 2022 and I will call her  with the results of the scan. Return to clinic every 6 weeks for Kingenti     No orders of the defined types were placed in this encounter.  The patient has a good understanding of the overall plan. she agrees with it. she will call with any problems that may develop before the next visit here.  Total time spent: 30 mins including face to face time and time spent for planning, charting and coordination of care  Rulon Eisenmenger, MD, MPH 04/04/2021  I, Thana Ates, am acting as scribe for Dr. Nicholas Lose.  I have reviewed the above documentation for accuracy and completeness, and I agree with the above.

## 2021-04-04 ENCOUNTER — Inpatient Hospital Stay (HOSPITAL_BASED_OUTPATIENT_CLINIC_OR_DEPARTMENT_OTHER): Payer: Medicare Other | Admitting: Hematology and Oncology

## 2021-04-04 ENCOUNTER — Inpatient Hospital Stay: Payer: Medicare Other | Attending: Hematology and Oncology

## 2021-04-04 ENCOUNTER — Other Ambulatory Visit: Payer: Self-pay

## 2021-04-04 ENCOUNTER — Inpatient Hospital Stay: Payer: Medicare Other

## 2021-04-04 VITALS — BP 131/63 | HR 84 | Temp 98.1°F | Resp 17 | Ht 62.0 in | Wt 120.8 lb

## 2021-04-04 DIAGNOSIS — C7951 Secondary malignant neoplasm of bone: Secondary | ICD-10-CM | POA: Insufficient documentation

## 2021-04-04 DIAGNOSIS — Z95828 Presence of other vascular implants and grafts: Secondary | ICD-10-CM

## 2021-04-04 DIAGNOSIS — C50411 Malignant neoplasm of upper-outer quadrant of right female breast: Secondary | ICD-10-CM

## 2021-04-04 DIAGNOSIS — Z5112 Encounter for antineoplastic immunotherapy: Secondary | ICD-10-CM | POA: Insufficient documentation

## 2021-04-04 DIAGNOSIS — Z170 Estrogen receptor positive status [ER+]: Secondary | ICD-10-CM | POA: Diagnosis not present

## 2021-04-04 DIAGNOSIS — C770 Secondary and unspecified malignant neoplasm of lymph nodes of head, face and neck: Secondary | ICD-10-CM

## 2021-04-04 DIAGNOSIS — Z7189 Other specified counseling: Secondary | ICD-10-CM

## 2021-04-04 LAB — CBC WITH DIFFERENTIAL (CANCER CENTER ONLY)
Abs Immature Granulocytes: 0.01 10*3/uL (ref 0.00–0.07)
Basophils Absolute: 0.1 10*3/uL (ref 0.0–0.1)
Basophils Relative: 1 %
Eosinophils Absolute: 0.1 10*3/uL (ref 0.0–0.5)
Eosinophils Relative: 1 %
HCT: 36.9 % (ref 36.0–46.0)
Hemoglobin: 12.1 g/dL (ref 12.0–15.0)
Immature Granulocytes: 0 %
Lymphocytes Relative: 32 %
Lymphs Abs: 2.5 10*3/uL (ref 0.7–4.0)
MCH: 29.8 pg (ref 26.0–34.0)
MCHC: 32.8 g/dL (ref 30.0–36.0)
MCV: 90.9 fL (ref 80.0–100.0)
Monocytes Absolute: 0.5 10*3/uL (ref 0.1–1.0)
Monocytes Relative: 6 %
Neutro Abs: 4.6 10*3/uL (ref 1.7–7.7)
Neutrophils Relative %: 60 %
Platelet Count: 287 10*3/uL (ref 150–400)
RBC: 4.06 MIL/uL (ref 3.87–5.11)
RDW: 13.3 % (ref 11.5–15.5)
WBC Count: 7.7 10*3/uL (ref 4.0–10.5)
nRBC: 0 % (ref 0.0–0.2)

## 2021-04-04 LAB — CMP (CANCER CENTER ONLY)
ALT: 14 U/L (ref 0–44)
AST: 12 U/L — ABNORMAL LOW (ref 15–41)
Albumin: 3.7 g/dL (ref 3.5–5.0)
Alkaline Phosphatase: 59 U/L (ref 38–126)
Anion gap: 7 (ref 5–15)
BUN: 12 mg/dL (ref 6–20)
CO2: 24 mmol/L (ref 22–32)
Calcium: 9 mg/dL (ref 8.9–10.3)
Chloride: 109 mmol/L (ref 98–111)
Creatinine: 0.66 mg/dL (ref 0.44–1.00)
GFR, Estimated: >60 mL/min (ref >60)
Glucose, Bld: 125 mg/dL — ABNORMAL HIGH (ref 70–99)
Potassium: 3.6 mmol/L (ref 3.5–5.1)
Sodium: 140 mmol/L (ref 135–145)
Total Bilirubin: 0.3 mg/dL (ref 0.3–1.2)
Total Protein: 7.8 g/dL (ref 6.5–8.1)

## 2021-04-04 MED ORDER — HEPARIN SOD (PORK) LOCK FLUSH 100 UNIT/ML IV SOLN
500.0000 [IU] | Freq: Once | INTRAVENOUS | Status: AC | PRN
  Administered 2021-04-04: 500 [IU]

## 2021-04-04 MED ORDER — SODIUM CHLORIDE 0.9% FLUSH
10.0000 mL | INTRAVENOUS | Status: DC | PRN
  Administered 2021-04-04: 10 mL

## 2021-04-04 MED ORDER — DIPHENHYDRAMINE HCL 25 MG PO CAPS
25.0000 mg | ORAL_CAPSULE | Freq: Once | ORAL | Status: AC
  Administered 2021-04-04: 25 mg via ORAL
  Filled 2021-04-04: qty 1

## 2021-04-04 MED ORDER — TRASTUZUMAB-ANNS CHEMO 150 MG IV SOLR
6.0000 mg/kg | Freq: Once | INTRAVENOUS | Status: AC
  Administered 2021-04-04: 336 mg via INTRAVENOUS
  Filled 2021-04-04: qty 16

## 2021-04-04 MED ORDER — ACETAMINOPHEN 325 MG PO TABS
650.0000 mg | ORAL_TABLET | Freq: Once | ORAL | Status: AC
  Administered 2021-04-04: 650 mg via ORAL
  Filled 2021-04-04: qty 2

## 2021-04-04 MED ORDER — SODIUM CHLORIDE 0.9 % IV SOLN: Freq: Once | INTRAVENOUS | Status: AC

## 2021-04-04 NOTE — Patient Instructions (Signed)
[facility]  Discharge Instructions: Thank you for choosing [facility] to provide your oncology and hematology care.   If you have a lab appointment with the [facility], please go directly to the [facility] and check in at the registration area.   Wear comfortable clothing and clothing appropriate for easy access to any Portacath or PICC line.   We strive to give you quality time with your provider. You may need to reschedule your appointment if you arrive late (15 or more minutes).  Arriving late affects you and other patients whose appointments are after yours.  Also, if you miss three or more appointments without notifying the office, you may be dismissed from the clinic at the provider's discretion.      For prescription refill requests, have your pharmacy contact our office and allow 72 hours for refills to be completed.    Today you received the following chemotherapy and/or immunotherapy agents: Trastuzumab (Kanjinti)     To help prevent nausea and vomiting after your treatment, we encourage you to take your nausea medication as directed.  BELOW ARE SYMPTOMS THAT SHOULD BE REPORTED IMMEDIATELY: *FEVER GREATER THAN 100.4 F (38 C) OR HIGHER *CHILLS OR SWEATING *NAUSEA AND VOMITING THAT IS NOT CONTROLLED WITH YOUR NAUSEA MEDICATION *UNUSUAL SHORTNESS OF BREATH *UNUSUAL BRUISING OR BLEEDING *URINARY PROBLEMS (pain or burning when urinating, or frequent urination) *BOWEL PROBLEMS (unusual diarrhea, constipation, pain near the anus) TENDERNESS IN MOUTH AND THROAT WITH OR WITHOUT PRESENCE OF ULCERS (sore throat, sores in mouth, or a toothache) UNUSUAL RASH, SWELLING OR PAIN  UNUSUAL VAGINAL DISCHARGE OR ITCHING   Items with * indicate a potential emergency and should be followed up as soon as possible or go to the Emergency Department if any problems should occur.  Please show the CHEMOTHERAPY ALERT CARD or IMMUNOTHERAPY ALERT CARD at  check-in to the Emergency Department and triage nurse.  Should you have questions after your visit or need to cancel or reschedule your appointment, please contact [facility]  Dept: [phone]  and follow the prompts.  Office hours are 8:00 a.m. to 4:30 p.m. Monday - Friday. Please note that voicemails left after 4:00 p.m. may not be returned until the following business day.  We are closed weekends and major holidays. You have access to a nurse at all times for urgent questions. Please call the main number to the clinic Dept: [phone] and follow the prompts.   For any non-urgent questions, you may also contact your provider using MyChart. We now offer e-Visits for anyone 38 and older to request care online for non-urgent symptoms. For details visit [email or web address].   Also download the MyChart app! Go to the app store, search "MyChart", open the app, select [facility], and log in with your MyChart username and password.  Due to Covid, a mask is required upon entering the hospital/clinic. If you do not have a mask, one will be given to you upon arrival. For doctor visits, patients may have 1 support person aged 65 or older with them. For treatment visits, patients cannot have anyone with them due to current Covid guidelines and our immunocompromised population.

## 2021-04-04 NOTE — Assessment & Plan Note (Signed)
05/01/18:Bilateral mastectomies: Left mastectomy: DCIS intermediate grade 0.9 cm margins negative Tis NX stage 0; right mastectomy: ILC, grade 3, 3.5 cm, with LCIS, perineural invasion present, margins negative, 15/22 lymph nodes positive with extracapsular extension, ER 90%, PR 0%, HER-2 +(IHC 3+), Ki-67 5%, T2N3M1Stage IV  06/03/2018 bone scan: Increased tracer uptake in the lumbar spine at L1, L3 and L4, right fourth and seventh ribs, inferior right scapula and distal sternum, T7 and T11 as well.  CT CAPshowed widespread bone metastases with possible pathologic fracture at L3, peripheral right upper lobe nodules nonspecific Patient has cerebral palsy and hersisteris the power of attorney for healthcare Treatment plan: Palliative treatment with Taxolx12 cycles completed 08/26/2018,Kingenti, Perjeta ------------------------------------------------------------------------------------------------------------------------ PET CT scan:10/27/2018:Widespread bone metastatic disease demonstrates reduced with sclerosis and low metabolic activity with SUV of 3-4. Left level 4 lymph node 0.5 cm and left deep parotid lymph node SUV 4.1 will need follow-up. Uterine leiomyomas.  Current treatment:Kingenti maintenance along with anastrozole  Anastrozole toxicities: Denies any hot flashes or myalgias. Kingenti toxicities: None Intermittent diarrhea  PET/CT 11/11/2019: Continued mild hypermetabolic activity left supraclavicular lymph node 0.7 cm, old sclerotic bone lesions are stable and not hypermetabolic. Uterine fibroids  CT CAP 08/23/20: No findings of active or progressive malignancy, remote innumerable osseus mets, left supra clav LN smaller Nextscan will be done in October 2022. Return to clinic every6 weeks for Kingenti

## 2021-04-04 NOTE — Progress Notes (Signed)
Ok to proceed with current echo per Dr. Gudena 

## 2021-04-16 ENCOUNTER — Other Ambulatory Visit: Payer: Medicare Other

## 2021-04-16 ENCOUNTER — Ambulatory Visit: Payer: Medicare Other

## 2021-04-22 ENCOUNTER — Other Ambulatory Visit: Payer: Self-pay | Admitting: Hematology and Oncology

## 2021-05-13 ENCOUNTER — Encounter: Payer: Self-pay | Admitting: Hematology and Oncology

## 2021-05-16 ENCOUNTER — Other Ambulatory Visit: Payer: Self-pay | Admitting: *Deleted

## 2021-05-16 ENCOUNTER — Other Ambulatory Visit: Payer: Self-pay

## 2021-05-16 ENCOUNTER — Inpatient Hospital Stay: Payer: Medicare Other | Attending: Hematology and Oncology

## 2021-05-16 ENCOUNTER — Inpatient Hospital Stay: Payer: Medicare Other

## 2021-05-16 VITALS — BP 131/68 | HR 81 | Temp 99.1°F | Resp 18

## 2021-05-16 DIAGNOSIS — C50411 Malignant neoplasm of upper-outer quadrant of right female breast: Secondary | ICD-10-CM | POA: Diagnosis not present

## 2021-05-16 DIAGNOSIS — Z5112 Encounter for antineoplastic immunotherapy: Secondary | ICD-10-CM | POA: Diagnosis not present

## 2021-05-16 DIAGNOSIS — Z7189 Other specified counseling: Secondary | ICD-10-CM

## 2021-05-16 DIAGNOSIS — Z5181 Encounter for therapeutic drug level monitoring: Secondary | ICD-10-CM

## 2021-05-16 DIAGNOSIS — C7951 Secondary malignant neoplasm of bone: Secondary | ICD-10-CM | POA: Diagnosis not present

## 2021-05-16 DIAGNOSIS — Z170 Estrogen receptor positive status [ER+]: Secondary | ICD-10-CM | POA: Diagnosis not present

## 2021-05-16 DIAGNOSIS — Z95828 Presence of other vascular implants and grafts: Secondary | ICD-10-CM

## 2021-05-16 DIAGNOSIS — C50911 Malignant neoplasm of unspecified site of right female breast: Secondary | ICD-10-CM

## 2021-05-16 LAB — CMP (CANCER CENTER ONLY)
ALT: 9 U/L (ref 0–44)
AST: 10 U/L — ABNORMAL LOW (ref 15–41)
Albumin: 3.9 g/dL (ref 3.5–5.0)
Alkaline Phosphatase: 55 U/L (ref 38–126)
Anion gap: 7 (ref 5–15)
BUN: 19 mg/dL (ref 6–20)
CO2: 25 mmol/L (ref 22–32)
Calcium: 8.8 mg/dL — ABNORMAL LOW (ref 8.9–10.3)
Chloride: 106 mmol/L (ref 98–111)
Creatinine: 0.64 mg/dL (ref 0.44–1.00)
GFR, Estimated: >60 mL/min (ref >60)
Glucose, Bld: 166 mg/dL — ABNORMAL HIGH (ref 70–99)
Potassium: 3.7 mmol/L (ref 3.5–5.1)
Sodium: 138 mmol/L (ref 135–145)
Total Bilirubin: 0.3 mg/dL (ref 0.3–1.2)
Total Protein: 7.8 g/dL (ref 6.5–8.1)

## 2021-05-16 LAB — CBC WITH DIFFERENTIAL (CANCER CENTER ONLY)
Abs Immature Granulocytes: 0.03 10*3/uL (ref 0.00–0.07)
Basophils Absolute: 0.1 10*3/uL (ref 0.0–0.1)
Basophils Relative: 1 %
Eosinophils Absolute: 0.1 10*3/uL (ref 0.0–0.5)
Eosinophils Relative: 1 %
HCT: 35.7 % — ABNORMAL LOW (ref 36.0–46.0)
Hemoglobin: 12.1 g/dL (ref 12.0–15.0)
Immature Granulocytes: 0 %
Lymphocytes Relative: 23 %
Lymphs Abs: 2.3 10*3/uL (ref 0.7–4.0)
MCH: 30.8 pg (ref 26.0–34.0)
MCHC: 33.9 g/dL (ref 30.0–36.0)
MCV: 90.8 fL (ref 80.0–100.0)
Monocytes Absolute: 0.5 10*3/uL (ref 0.1–1.0)
Monocytes Relative: 6 %
Neutro Abs: 6.8 10*3/uL (ref 1.7–7.7)
Neutrophils Relative %: 69 %
Platelet Count: 314 10*3/uL (ref 150–400)
RBC: 3.93 MIL/uL (ref 3.87–5.11)
RDW: 13.4 % (ref 11.5–15.5)
WBC Count: 9.8 10*3/uL (ref 4.0–10.5)
nRBC: 0 % (ref 0.0–0.2)

## 2021-05-16 MED ORDER — SODIUM CHLORIDE 0.9 % IV SOLN: Freq: Once | INTRAVENOUS | Status: AC

## 2021-05-16 MED ORDER — SODIUM CHLORIDE 0.9% FLUSH
10.0000 mL | INTRAVENOUS | Status: DC | PRN
  Administered 2021-05-16: 10 mL

## 2021-05-16 MED ORDER — HEPARIN SOD (PORK) LOCK FLUSH 100 UNIT/ML IV SOLN
500.0000 [IU] | Freq: Once | INTRAVENOUS | Status: AC | PRN
  Administered 2021-05-16: 500 [IU]

## 2021-05-16 MED ORDER — ACETAMINOPHEN 325 MG PO TABS
650.0000 mg | ORAL_TABLET | Freq: Once | ORAL | Status: AC
  Administered 2021-05-16: 650 mg via ORAL
  Filled 2021-05-16: qty 2

## 2021-05-16 MED ORDER — DIPHENHYDRAMINE HCL 25 MG PO CAPS
25.0000 mg | ORAL_CAPSULE | Freq: Once | ORAL | Status: AC
  Administered 2021-05-16: 25 mg via ORAL
  Filled 2021-05-16: qty 1

## 2021-05-16 MED ORDER — TRASTUZUMAB-ANNS CHEMO 150 MG IV SOLR
6.0000 mg/kg | Freq: Once | INTRAVENOUS | Status: AC
  Administered 2021-05-16: 336 mg via INTRAVENOUS
  Filled 2021-05-16: qty 16

## 2021-05-16 NOTE — Progress Notes (Signed)
Per Dr. Lindi Adie OK to proceed w/ treatment today w/ ECHO from 11/09/2020.

## 2021-05-16 NOTE — Patient Instructions (Signed)
[facility]  Discharge Instructions: °Thank you for choosing [facility] to provide your oncology and hematology care.  ° °If you have a lab appointment with the Cancer Center, please go directly to the Cancer Center and check in at the registration area. °  °Wear comfortable clothing and clothing appropriate for easy access to any Portacath or PICC line.  ° °We strive to give you quality time with your provider. You may need to reschedule your appointment if you arrive late (15 or more minutes).  Arriving late affects you and other patients whose appointments are after yours.  Also, if you miss three or more appointments without notifying the office, you may be dismissed from the clinic at the provider’s discretion.    °  °For prescription refill requests, have your pharmacy contact our office and allow 72 hours for refills to be completed.   ° °Today you received the following chemotherapy and/or immunotherapy agents: Kanjinti °  °To help prevent nausea and vomiting after your treatment, we encourage you to take your nausea medication as directed. ° °BELOW ARE SYMPTOMS THAT SHOULD BE REPORTED IMMEDIATELY: °*FEVER GREATER THAN 100.4 F (38 °C) OR HIGHER °*CHILLS OR SWEATING °*NAUSEA AND VOMITING THAT IS NOT CONTROLLED WITH YOUR NAUSEA MEDICATION °*UNUSUAL SHORTNESS OF BREATH °*UNUSUAL BRUISING OR BLEEDING °*URINARY PROBLEMS (pain or burning when urinating, or frequent urination) °*BOWEL PROBLEMS (unusual diarrhea, constipation, pain near the anus) °TENDERNESS IN MOUTH AND THROAT WITH OR WITHOUT PRESENCE OF ULCERS (sore throat, sores in mouth, or a toothache) °UNUSUAL RASH, SWELLING OR PAIN  °UNUSUAL VAGINAL DISCHARGE OR ITCHING  ° °Items with * indicate a potential emergency and should be followed up as soon as possible or go to the Emergency Department if any problems should occur. ° °Please show the CHEMOTHERAPY ALERT CARD or IMMUNOTHERAPY ALERT CARD at check-in to the  Emergency Department and triage nurse. ° °Should you have questions after your visit or need to cancel or reschedule your appointment, please contact [facility]  Dept: [phone]  and follow the prompts.  Office hours are 8:00 a.m. to 4:30 p.m. Monday - Friday. Please note that voicemails left after 4:00 p.m. may not be returned until the following business day.  We are closed weekends and major holidays. You have access to a nurse at all times for urgent questions. Please call the main number to the clinic Dept: [phone] and follow the prompts. ° ° °For any non-urgent questions, you may also contact your provider using MyChart. We now offer e-Visits for anyone 18 and older to request care online for non-urgent symptoms. For details visit [email or web address]. °  °Also download the MyChart app! Go to the app store, search "MyChart", open the app, select , and log in with your MyChart username and password. ° °Due to Covid, a mask is required upon entering the hospital/clinic. If you do not have a mask, one will be given to you upon arrival. For doctor visits, patients may have 1 support person aged 18 or older with them. For treatment visits, patients cannot have anyone with them due to current Covid guidelines and our immunocompromised population.  ° °

## 2021-05-17 ENCOUNTER — Ambulatory Visit (HOSPITAL_COMMUNITY)
Admission: RE | Admit: 2021-05-17 | Discharge: 2021-05-17 | Disposition: A | Discharge status: Home / Self Care | Payer: Medicare Other | Source: Ambulatory Visit | Attending: Hematology and Oncology | Admitting: Hematology and Oncology

## 2021-05-17 DIAGNOSIS — Z170 Estrogen receptor positive status [ER+]: Secondary | ICD-10-CM | POA: Diagnosis not present

## 2021-05-17 DIAGNOSIS — M4856XA Collapsed vertebra, not elsewhere classified, lumbar region, initial encounter for fracture: Secondary | ICD-10-CM | POA: Diagnosis not present

## 2021-05-17 DIAGNOSIS — C50411 Malignant neoplasm of upper-outer quadrant of right female breast: Secondary | ICD-10-CM | POA: Insufficient documentation

## 2021-05-17 DIAGNOSIS — C770 Secondary and unspecified malignant neoplasm of lymph nodes of head, face and neck: Secondary | ICD-10-CM | POA: Insufficient documentation

## 2021-05-17 DIAGNOSIS — C50919 Malignant neoplasm of unspecified site of unspecified female breast: Secondary | ICD-10-CM | POA: Diagnosis not present

## 2021-05-17 DIAGNOSIS — K760 Fatty (change of) liver, not elsewhere classified: Secondary | ICD-10-CM | POA: Diagnosis not present

## 2021-05-17 DIAGNOSIS — J9811 Atelectasis: Secondary | ICD-10-CM | POA: Diagnosis not present

## 2021-05-17 DIAGNOSIS — N281 Cyst of kidney, acquired: Secondary | ICD-10-CM | POA: Diagnosis not present

## 2021-05-17 MED ORDER — IOHEXOL 350 MG/ML SOLN
80.0000 mL | Freq: Once | INTRAVENOUS | Status: AC | PRN
  Administered 2021-05-17: 80 mL via INTRAVENOUS

## 2021-05-17 MED ORDER — SODIUM CHLORIDE (PF) 0.9 % IJ SOLN
INTRAMUSCULAR | Status: AC
  Filled 2021-05-17: qty 50

## 2021-05-17 MED ORDER — HEPARIN SOD (PORK) LOCK FLUSH 100 UNIT/ML IV SOLN
500.0000 [IU] | Freq: Once | INTRAVENOUS | Status: AC
  Administered 2021-05-17: 500 [IU] via INTRAVENOUS

## 2021-05-19 ENCOUNTER — Other Ambulatory Visit: Payer: Self-pay | Admitting: Hematology and Oncology

## 2021-05-29 ENCOUNTER — Encounter: Payer: Self-pay | Admitting: Hematology and Oncology

## 2021-05-30 ENCOUNTER — Ambulatory Visit: Payer: Medicare Other | Admitting: Hematology and Oncology

## 2021-05-30 ENCOUNTER — Other Ambulatory Visit: Payer: Medicare Other

## 2021-05-30 ENCOUNTER — Ambulatory Visit: Payer: Medicare Other

## 2021-06-05 ENCOUNTER — Encounter: Payer: Self-pay | Admitting: Hematology and Oncology

## 2021-06-05 ENCOUNTER — Other Ambulatory Visit: Payer: Self-pay

## 2021-06-05 ENCOUNTER — Ambulatory Visit (HOSPITAL_COMMUNITY)
Admission: RE | Admit: 2021-06-05 | Discharge: 2021-06-05 | Disposition: A | Discharge status: Home / Self Care | Payer: Medicare Other | Source: Ambulatory Visit | Attending: Hematology and Oncology | Admitting: Hematology and Oncology

## 2021-06-05 DIAGNOSIS — C50911 Malignant neoplasm of unspecified site of right female breast: Secondary | ICD-10-CM

## 2021-06-05 DIAGNOSIS — Z79899 Other long term (current) drug therapy: Secondary | ICD-10-CM | POA: Insufficient documentation

## 2021-06-05 DIAGNOSIS — I10 Essential (primary) hypertension: Secondary | ICD-10-CM | POA: Insufficient documentation

## 2021-06-05 DIAGNOSIS — Z170 Estrogen receptor positive status [ER+]: Secondary | ICD-10-CM | POA: Diagnosis not present

## 2021-06-05 DIAGNOSIS — Z0189 Encounter for other specified special examinations: Secondary | ICD-10-CM

## 2021-06-05 DIAGNOSIS — C50411 Malignant neoplasm of upper-outer quadrant of right female breast: Secondary | ICD-10-CM | POA: Insufficient documentation

## 2021-06-05 DIAGNOSIS — Z5181 Encounter for therapeutic drug level monitoring: Secondary | ICD-10-CM

## 2021-06-05 LAB — ECHOCARDIOGRAM COMPLETE
AR max vel: 1.78 cm2
AV Area VTI: 1.96 cm2
AV Area mean vel: 1.85 cm2
AV Mean grad: 3 mmHg
AV Peak grad: 6.8 mmHg
Ao pk vel: 1.3 m/s
Area-P 1/2: 4.68 cm2
Calc EF: 55.8 %
S' Lateral: 2.7 cm
Single Plane A2C EF: 55.8 %
Single Plane A4C EF: 55.7 %

## 2021-06-19 ENCOUNTER — Other Ambulatory Visit: Payer: Self-pay | Admitting: Hematology and Oncology

## 2021-06-20 ENCOUNTER — Encounter: Payer: Self-pay | Admitting: Hematology and Oncology

## 2021-06-26 ENCOUNTER — Other Ambulatory Visit: Payer: Self-pay | Admitting: *Deleted

## 2021-06-26 DIAGNOSIS — C50411 Malignant neoplasm of upper-outer quadrant of right female breast: Secondary | ICD-10-CM

## 2021-06-26 DIAGNOSIS — Z170 Estrogen receptor positive status [ER+]: Secondary | ICD-10-CM

## 2021-06-26 NOTE — Assessment & Plan Note (Signed)
05/01/18:Bilateral mastectomies: Left mastectomy: DCIS intermediate grade 0.9 cm margins negative Tis NX stage 0; right mastectomy: ILC, grade 3, 3.5 cm, with LCIS, perineural invasion present, margins negative, 15/22 lymph nodes positive with extracapsular extension, ER 90%, PR 0%, HER-2 +(IHC 3+), Ki-67 5%, T2N3M1Stage IV  06/03/2018 bone scan: Increased tracer uptake in the lumbar spine at L1, L3 and L4, right fourth and seventh ribs, inferior right scapula and distal sternum, T7 and T11 as well.  CT CAPshowed widespread bone metastases with possible pathologic fracture at L3, peripheral right upper lobe nodules nonspecific Patient has cerebral palsy and hersisteris the power of attorney for healthcare Treatment plan: Palliative treatment with Taxolx12 cycles completed 08/26/2018,Kingenti, Perjeta ------------------------------------------------------------------------------------------------------------------------ PET CT scan:10/27/2018:Widespread bone metastatic disease demonstrates reduced with sclerosis and low metabolic activity with SUV of 3-4. Left level 4 lymph node 0.5 cm and left deep parotid lymph node SUV 4.1 will need follow-up. Uterine leiomyomas.  Current treatment:Kingenti maintenance along with anastrozole  Anastrozole toxicities: Denies any hot flashes or myalgias. Kingenti toxicities: None Intermittent diarrhea  PET/CT 11/11/2019: Continued mild hypermetabolic activity left supraclavicular lymph node 0.7 cm, old sclerotic bone lesions are stable and not hypermetabolic. Uterine fibroids  CT CAP 08/23/20: No findings of active or progressive malignancy, remote innumerable osseus mets, left supra clav LN smaller Nextscan will be done in December 2022 and I will call her with the results of the scan. Return to clinic every6 weeks for Kingenti

## 2021-06-26 NOTE — Progress Notes (Signed)
Patient Care Team: Janora Norlander, DO as PCP - General (Family Medicine) Danie Binder, MD (Inactive) (Gastroenterology) Alphonsa Overall, MD as Consulting Physician (General Surgery) Nicholas Lose, MD as Consulting Physician (Hematology and Oncology) Kyung Rudd, MD as Consulting Physician (Radiation Oncology) Mauro Kaufmann, RN as Oncology Nurse Navigator Rockwell Germany, RN as Oncology Nurse Navigator  DIAGNOSIS:    ICD-10-CM   1. Malignant neoplasm of upper-outer quadrant of right breast in female, estrogen receptor positive ([place])  C50.411    Z17.0       SUMMARY OF ONCOLOGIC HISTORY: Oncology History  Malignant neoplasm of upper-outer quadrant of right breast in female, estrogen receptor positive ([place])  03/18/2018 Initial Diagnosis   Screening detected bilateral breast abnormalities.  Right breast mass UOQ 2.1 cm at 10 o'clock position: Grade 1 ILC ER 70%, PR 0%, HER-2 +3+ by IHC, Ki-67 5%, suspected lymph node in the axilla could not be biopsied; left breast calcifications by ultrasound not visible but 5 cm cystic/tubular structure which on biopsy was fibrocystic change with ADHl; T2N0 stage IIa clinical stage   05/01/2018 Surgery   Bilateral mastectomies: Left mastectomy: DCIS intermediate grade 0.9 cm margins negative Tis NX stage 0; right mastectomy: ILC, grade 3, 3.5 cm, with LCIS, perineural invasion present, margins negative, 15/22 lymph nodes positive with extracapsular extension, ER 90%, PR 0%, HER-2 +(IHC 3+), Ki-67 5%, T2N3 Stage 3B    05/11/2018 Cancer Staging   Staging form: Breast, AJCC 8th Edition - Pathologic stage from 05/11/2018: Stage IIIB (pT2, pN3, cM0, G3, ER+, PR-, HER2+) - Signed by Nicholas Lose, MD on 05/11/2018    06/03/2018 Imaging   Bone scan: Increased tracer uptake in the lumbar spine at L1, L3 and L4, right fourth and seventh ribs, inferior right scapula and distal sternum, T7 and T11 as well.   CT CAP showed widespread bone metastases with  possible pathologic fracture at L3, peripheral right upper lobe nodules nonspecific   06/10/2018 -  Chemotherapy   Taxol Kingenti and Perjeta    10/27/2018 PET scan   Bone metastases less metabolically active.  Couple of lymph nodes with low-level metabolic activity noted.     CHIEF COMPLIANT: Follow-up of metastatic breast cancer  INTERVAL HISTORY: [name] is a 56 y.o. with above-mentioned history of metastatic breast cancer currently on treatment with maintenance Kingenti and on anti-estrogen therapy with anastrozole. She presents to the clinic today for treatment.  She is tolerating Herceptin extremely well without any problems or concerns.  Echocardiograms have come back normal.  ALLERGIES:  is allergic to hydrocodone.  MEDICATIONS:  Current Outpatient Medications  Medication Sig Dispense Refill   diphenoxylate-atropine (LOMOTIL) 2.5-0.025 MG tablet TAKE 1 TO 2 TABLETS BY MOUTH 4 TIMES A DAY AS NEEDED FOR DIARRHEA 40 tablet 1   ibuprofen (ADVIL,MOTRIN) 600 MG tablet Take 1 tablet (600 mg total) by mouth every 6 (six) hours as needed (for mild pain not relieved by other medications.). 30 tablet 0   lisinopril (ZESTRIL) 40 MG tablet TAKE 1 TABLET BY MOUTH EVERY DAY 90 tablet 3   megestrol (MEGACE) 40 MG tablet TAKE 3 TABLETS EVERY DAY 270 tablet 11   megestrol (MEGACE) 40 MG tablet TAKE 1 TABLET (40 MG TOTAL) BY MOUTH 2 (TWO) TIMES DAILY. TAKE 3 TABLETS EVERY DAY 180 tablet 3   ondansetron (ZOFRAN-ODT) 4 MG disintegrating tablet Take 1 tablet (4 mg total) by mouth every 6 (six) hours as needed for nausea. 15 tablet 0   pantoprazole (  PROTONIX) 40 MG tablet TAKE 1 TABLET BY MOUTH 30 MINUTES PRIOR TO BREAKFAST AND SUPPER, 180 tablet 3   polyethylene glycol (MIRALAX / GLYCOLAX) packet Take 17 g by mouth daily as needed for moderate constipation.     potassium chloride (KLOR-CON) 10 MEQ tablet TAKE 1 TABLET BY MOUTH EVERY DAY 30 tablet 1   pravastatin (PRAVACHOL) 40 MG tablet TAKE 1  TABLET BY MOUTH DAILY 90 tablet 3   tamoxifen (NOLVADEX) 20 MG tablet TAKE 1 TABLET BY MOUTH EVERY DAY 90 tablet 3   No current facility-administered medications for this visit.    PHYSICAL EXAMINATION: ECOG PERFORMANCE STATUS: 1 - Symptomatic but completely ambulatory  Vitals:   06/27/21 0843  BP: 133/78  Pulse: 84  Resp: 18  Temp: 97.7 F (36.5 C)  SpO2: 98%   Filed Weights   06/27/21 0843  Weight: 127 lb 6.4 oz (57.8 kg)      LABORATORY DATA:  I have reviewed the data as listed CMP Latest Ref Rng & Units 05/16/2021 04/04/2021 02/21/2021  Glucose 70 - 99 mg/dL 166(H) 125(H) 177(H)  BUN 6 - 20 mg/dL 19 12 19   Creatinine 0.44 - 1.00 mg/dL 0.64 0.66 0.58  Sodium 135 - 145 mmol/L 138 140 145  Potassium 3.5 - 5.1 mmol/L 3.7 3.6 4.0  Chloride 98 - 111 mmol/L 106 109 113(H)  CO2 22 - 32 mmol/L 25 24 26   Calcium 8.9 - 10.3 mg/dL 8.8(L) 9.0 9.1  Total Protein 6.5 - 8.1 g/dL 7.8 7.8 7.6  Total Bilirubin 0.3 - 1.2 mg/dL 0.3 0.3 0.2(L)  Alkaline Phos 38 - 126 U/L 55 59 48  AST 15 - 41 U/L 10(L) 12(L) 13(L)  ALT 0 - 44 U/L 9 14 17     Lab Results  Component Value Date   WBC 9.0 06/27/2021   HGB 11.4 (L) 06/27/2021   HCT 33.7 (L) 06/27/2021   MCV 90.3 06/27/2021   PLT 280 06/27/2021   NEUTROABS 6.2 06/27/2021    ASSESSMENT & PLAN:  Malignant neoplasm of upper-outer quadrant of right breast in female, estrogen receptor positive ([place]) 05/01/18: Bilateral mastectomies: Left mastectomy: DCIS intermediate grade 0.9 cm margins negative Tis NX stage 0; right mastectomy: ILC, grade 3, 3.5 cm, with LCIS, perineural invasion present, margins negative, 15/22 lymph nodes positive with extracapsular extension, ER 90%, PR 0%, HER-2 +(IHC 3+), Ki-67 5%, T2N3M1 Stage IV   06/03/2018 bone scan: Increased tracer uptake in the lumbar spine at L1, L3 and L4, right fourth and seventh ribs, inferior right scapula and distal sternum, T7 and T11 as well.   CT CAP showed widespread bone metastases  with possible pathologic fracture at L3, peripheral right upper lobe nodules nonspecific Patient has cerebral palsy and her sister is the power of attorney for healthcare Treatment plan: Palliative treatment with Taxol x12 cycles completed 08/26/2018, Kingenti, Perjeta ------------------------------------------------------------------------------------------------------------------------ PET CT scan: 10/27/2018: Widespread bone metastatic disease demonstrates reduced with sclerosis and low metabolic activity with SUV of 3-4.  Left level 4 lymph node 0.5 cm and left deep parotid lymph node SUV 4.1 will need follow-up.  Uterine leiomyomas.   Current treatment: Kingenti maintenance along with anastrozole   Anastrozole toxicities: Denies any hot flashes or myalgias. Kingenti toxicities: None     PET/CT 11/11/2019: Continued mild hypermetabolic activity left supraclavicular lymph node 0.7 cm, old sclerotic bone lesions are stable and not hypermetabolic.  Uterine fibroids  CT CAP 05/17/2021: No new or progressive findings in chest abdomen or pelvis.  Areas of  subtle sclerosis remain in the spine and the pelvis compatible with treated metastatic disease.  Return to clinic every 6 weeks for Kingenti  I will see her back in 3 months.    No orders of the defined types were placed in this encounter.  The patient has a good understanding of the overall plan. she agrees with it. she will call with any problems that may develop before the next visit here.  Total time spent: 30 mins including face to face time and time spent for planning, charting and coordination of care  Rulon Eisenmenger, MD, MPH 06/27/2021  I, Thana Ates, am acting as scribe for Dr. Nicholas Lose.  I have reviewed the above documentation for accuracy and completeness, and I agree with the above.

## 2021-06-27 ENCOUNTER — Inpatient Hospital Stay: Payer: Medicare Other | Attending: Hematology and Oncology | Admitting: Hematology and Oncology

## 2021-06-27 ENCOUNTER — Other Ambulatory Visit: Payer: Self-pay

## 2021-06-27 ENCOUNTER — Inpatient Hospital Stay: Payer: Medicare Other

## 2021-06-27 DIAGNOSIS — Z5112 Encounter for antineoplastic immunotherapy: Secondary | ICD-10-CM | POA: Insufficient documentation

## 2021-06-27 DIAGNOSIS — C50411 Malignant neoplasm of upper-outer quadrant of right female breast: Secondary | ICD-10-CM | POA: Diagnosis not present

## 2021-06-27 DIAGNOSIS — Z95828 Presence of other vascular implants and grafts: Secondary | ICD-10-CM

## 2021-06-27 DIAGNOSIS — Z170 Estrogen receptor positive status [ER+]: Secondary | ICD-10-CM

## 2021-06-27 DIAGNOSIS — C7951 Secondary malignant neoplasm of bone: Secondary | ICD-10-CM | POA: Diagnosis not present

## 2021-06-27 DIAGNOSIS — Z7189 Other specified counseling: Secondary | ICD-10-CM

## 2021-06-27 LAB — CMP (CANCER CENTER ONLY)
ALT: 15 U/L (ref 0–44)
AST: 14 U/L — ABNORMAL LOW (ref 15–41)
Albumin: 3.6 g/dL (ref 3.5–5.0)
Alkaline Phosphatase: 47 U/L (ref 38–126)
Anion gap: 6 (ref 5–15)
BUN: 18 mg/dL (ref 6–20)
CO2: 23 mmol/L (ref 22–32)
Calcium: 8.6 mg/dL — ABNORMAL LOW (ref 8.9–10.3)
Chloride: 107 mmol/L (ref 98–111)
Creatinine: 0.58 mg/dL (ref 0.44–1.00)
GFR, Estimated: >60 mL/min (ref >60)
Glucose, Bld: 151 mg/dL — ABNORMAL HIGH (ref 70–99)
Potassium: 3.4 mmol/L — ABNORMAL LOW (ref 3.5–5.1)
Sodium: 136 mmol/L (ref 135–145)
Total Bilirubin: 0.5 mg/dL (ref 0.3–1.2)
Total Protein: 7.4 g/dL (ref 6.5–8.1)

## 2021-06-27 LAB — CBC WITH DIFFERENTIAL (CANCER CENTER ONLY)
Abs Immature Granulocytes: 0.02 10*3/uL (ref 0.00–0.07)
Basophils Absolute: 0 10*3/uL (ref 0.0–0.1)
Basophils Relative: 0 %
Eosinophils Absolute: 0.1 10*3/uL (ref 0.0–0.5)
Eosinophils Relative: 1 %
HCT: 33.7 % — ABNORMAL LOW (ref 36.0–46.0)
Hemoglobin: 11.4 g/dL — ABNORMAL LOW (ref 12.0–15.0)
Immature Granulocytes: 0 %
Lymphocytes Relative: 25 %
Lymphs Abs: 2.2 10*3/uL (ref 0.7–4.0)
MCH: 30.6 pg (ref 26.0–34.0)
MCHC: 33.8 g/dL (ref 30.0–36.0)
MCV: 90.3 fL (ref 80.0–100.0)
Monocytes Absolute: 0.4 10*3/uL (ref 0.1–1.0)
Monocytes Relative: 5 %
Neutro Abs: 6.2 10*3/uL (ref 1.7–7.7)
Neutrophils Relative %: 69 %
Platelet Count: 280 10*3/uL (ref 150–400)
RBC: 3.73 MIL/uL — ABNORMAL LOW (ref 3.87–5.11)
RDW: 13.3 % (ref 11.5–15.5)
WBC Count: 9 10*3/uL (ref 4.0–10.5)
nRBC: 0 % (ref 0.0–0.2)

## 2021-06-27 MED ORDER — DIPHENHYDRAMINE HCL 25 MG PO CAPS
25.0000 mg | ORAL_CAPSULE | Freq: Once | ORAL | Status: AC
  Administered 2021-06-27: 25 mg via ORAL
  Filled 2021-06-27: qty 1

## 2021-06-27 MED ORDER — SODIUM CHLORIDE 0.9% FLUSH
10.0000 mL | INTRAVENOUS | Status: DC | PRN
  Administered 2021-06-27: 10 mL

## 2021-06-27 MED ORDER — SODIUM CHLORIDE 0.9 % IV SOLN: Freq: Once | INTRAVENOUS | Status: AC

## 2021-06-27 MED ORDER — TRASTUZUMAB-ANNS CHEMO 150 MG IV SOLR
6.0000 mg/kg | Freq: Once | INTRAVENOUS | Status: AC
  Administered 2021-06-27: 336 mg via INTRAVENOUS
  Filled 2021-06-27: qty 16

## 2021-06-27 MED ORDER — HEPARIN SOD (PORK) LOCK FLUSH 100 UNIT/ML IV SOLN
500.0000 [IU] | Freq: Once | INTRAVENOUS | Status: AC | PRN
  Administered 2021-06-27: 500 [IU]

## 2021-06-27 MED ORDER — ACETAMINOPHEN 325 MG PO TABS
650.0000 mg | ORAL_TABLET | Freq: Once | ORAL | Status: AC
  Administered 2021-06-27: 650 mg via ORAL
  Filled 2021-06-27: qty 2

## 2021-06-27 NOTE — Patient Instructions (Signed)
[facility]  Discharge Instructions: °Thank you for choosing [facility] to provide your oncology and hematology care.  ° °If you have a lab appointment with the Cancer Center, please go directly to the Cancer Center and check in at the registration area. °  °Wear comfortable clothing and clothing appropriate for easy access to any Portacath or PICC line.  ° °We strive to give you quality time with your provider. You may need to reschedule your appointment if you arrive late (15 or more minutes).  Arriving late affects you and other patients whose appointments are after yours.  Also, if you miss three or more appointments without notifying the office, you may be dismissed from the clinic at the provider’s discretion.    °  °For prescription refill requests, have your pharmacy contact our office and allow 72 hours for refills to be completed.   ° °Today you received the following chemotherapy and/or immunotherapy agents: Kanjinti °  °To help prevent nausea and vomiting after your treatment, we encourage you to take your nausea medication as directed. ° °BELOW ARE SYMPTOMS THAT SHOULD BE REPORTED IMMEDIATELY: °*FEVER GREATER THAN 100.4 F (38 °C) OR HIGHER °*CHILLS OR SWEATING °*NAUSEA AND VOMITING THAT IS NOT CONTROLLED WITH YOUR NAUSEA MEDICATION °*UNUSUAL SHORTNESS OF BREATH °*UNUSUAL BRUISING OR BLEEDING °*URINARY PROBLEMS (pain or burning when urinating, or frequent urination) °*BOWEL PROBLEMS (unusual diarrhea, constipation, pain near the anus) °TENDERNESS IN MOUTH AND THROAT WITH OR WITHOUT PRESENCE OF ULCERS (sore throat, sores in mouth, or a toothache) °UNUSUAL RASH, SWELLING OR PAIN  °UNUSUAL VAGINAL DISCHARGE OR ITCHING  ° °Items with * indicate a potential emergency and should be followed up as soon as possible or go to the Emergency Department if any problems should occur. ° °Please show the CHEMOTHERAPY ALERT CARD or IMMUNOTHERAPY ALERT CARD at check-in to the  Emergency Department and triage nurse. ° °Should you have questions after your visit or need to cancel or reschedule your appointment, please contact [facility]  Dept: [phone]  and follow the prompts.  Office hours are 8:00 a.m. to 4:30 p.m. Monday - Friday. Please note that voicemails left after 4:00 p.m. may not be returned until the following business day.  We are closed weekends and major holidays. You have access to a nurse at all times for urgent questions. Please call the main number to the clinic Dept: [phone] and follow the prompts. ° ° °For any non-urgent questions, you may also contact your provider using MyChart. We now offer e-Visits for anyone 18 and older to request care online for non-urgent symptoms. For details visit [email or web address]. °  °Also download the MyChart app! Go to the app store, search "MyChart", open the app, select [facility], and log in with your MyChart username and password. ° °Due to Covid, a mask is required upon entering the hospital/clinic. If you do not have a mask, one will be given to you upon arrival. For doctor visits, patients may have 1 support person aged 18 or older with them. For treatment visits, patients cannot have anyone with them due to current Covid guidelines and our immunocompromised population.  ° °

## 2021-06-28 ENCOUNTER — Telehealth: Payer: Self-pay | Admitting: Hematology and Oncology

## 2021-06-28 NOTE — Telephone Encounter (Signed)
Scheduled appointment per 2/15 los. Left message. Patient will be mailed an updated calendar.

## 2021-07-15 ENCOUNTER — Other Ambulatory Visit: Payer: Self-pay | Admitting: Hematology and Oncology

## 2021-07-22 ENCOUNTER — Other Ambulatory Visit: Payer: Self-pay | Admitting: Hematology and Oncology

## 2021-08-02 ENCOUNTER — Other Ambulatory Visit: Payer: Self-pay | Admitting: Hematology and Oncology

## 2021-08-02 DIAGNOSIS — E785 Hyperlipidemia, unspecified: Secondary | ICD-10-CM

## 2021-08-08 ENCOUNTER — Inpatient Hospital Stay: Payer: Medicare Other | Attending: Hematology and Oncology

## 2021-08-08 ENCOUNTER — Other Ambulatory Visit: Payer: Self-pay

## 2021-08-08 VITALS — BP 128/74 | HR 83 | Temp 98.2°F | Resp 18

## 2021-08-08 DIAGNOSIS — Z7189 Other specified counseling: Secondary | ICD-10-CM

## 2021-08-08 DIAGNOSIS — C50411 Malignant neoplasm of upper-outer quadrant of right female breast: Secondary | ICD-10-CM | POA: Insufficient documentation

## 2021-08-08 DIAGNOSIS — C7951 Secondary malignant neoplasm of bone: Secondary | ICD-10-CM | POA: Insufficient documentation

## 2021-08-08 DIAGNOSIS — Z5112 Encounter for antineoplastic immunotherapy: Secondary | ICD-10-CM | POA: Insufficient documentation

## 2021-08-08 MED ORDER — TRASTUZUMAB-ANNS CHEMO 150 MG IV SOLR
6.0000 mg/kg | Freq: Once | INTRAVENOUS | Status: AC
  Administered 2021-08-08: 336 mg via INTRAVENOUS
  Filled 2021-08-08: qty 16

## 2021-08-08 MED ORDER — SODIUM CHLORIDE 0.9 % IV SOLN: Freq: Once | INTRAVENOUS | Status: AC

## 2021-08-08 MED ORDER — DIPHENHYDRAMINE HCL 25 MG PO CAPS
25.0000 mg | ORAL_CAPSULE | Freq: Once | ORAL | Status: AC
  Administered 2021-08-08: 25 mg via ORAL
  Filled 2021-08-08: qty 1

## 2021-08-08 MED ORDER — ACETAMINOPHEN 325 MG PO TABS
650.0000 mg | ORAL_TABLET | Freq: Once | ORAL | Status: AC
  Administered 2021-08-08: 650 mg via ORAL
  Filled 2021-08-08: qty 2

## 2021-08-08 NOTE — Patient Instructions (Signed)
[facility]  Discharge Instructions: °Thank you for choosing [facility] to provide your oncology and hematology care.  ° °If you have a lab appointment with the Cancer Center, please go directly to the Cancer Center and check in at the registration area. °  °Wear comfortable clothing and clothing appropriate for easy access to any Portacath or PICC line.  ° °We strive to give you quality time with your provider. You may need to reschedule your appointment if you arrive late (15 or more minutes).  Arriving late affects you and other patients whose appointments are after yours.  Also, if you miss three or more appointments without notifying the office, you may be dismissed from the clinic at the provider’s discretion.    °  °For prescription refill requests, have your pharmacy contact our office and allow 72 hours for refills to be completed.   ° °Today you received the following chemotherapy and/or immunotherapy agents: Kanjinti °  °To help prevent nausea and vomiting after your treatment, we encourage you to take your nausea medication as directed. ° °BELOW ARE SYMPTOMS THAT SHOULD BE REPORTED IMMEDIATELY: °*FEVER GREATER THAN 100.4 F (38 °C) OR HIGHER °*CHILLS OR SWEATING °*NAUSEA AND VOMITING THAT IS NOT CONTROLLED WITH YOUR NAUSEA MEDICATION °*UNUSUAL SHORTNESS OF BREATH °*UNUSUAL BRUISING OR BLEEDING °*URINARY PROBLEMS (pain or burning when urinating, or frequent urination) °*BOWEL PROBLEMS (unusual diarrhea, constipation, pain near the anus) °TENDERNESS IN MOUTH AND THROAT WITH OR WITHOUT PRESENCE OF ULCERS (sore throat, sores in mouth, or a toothache) °UNUSUAL RASH, SWELLING OR PAIN  °UNUSUAL VAGINAL DISCHARGE OR ITCHING  ° °Items with * indicate a potential emergency and should be followed up as soon as possible or go to the Emergency Department if any problems should occur. ° °Please show the CHEMOTHERAPY ALERT CARD or IMMUNOTHERAPY ALERT CARD at check-in to the  Emergency Department and triage nurse. ° °Should you have questions after your visit or need to cancel or reschedule your appointment, please contact [facility]  Dept: [phone]  and follow the prompts.  Office hours are 8:00 a.m. to 4:30 p.m. Monday - Friday. Please note that voicemails left after 4:00 p.m. may not be returned until the following business day.  We are closed weekends and major holidays. You have access to a nurse at all times for urgent questions. Please call the main number to the clinic Dept: [phone] and follow the prompts. ° ° °For any non-urgent questions, you may also contact your provider using MyChart. We now offer e-Visits for anyone 18 and older to request care online for non-urgent symptoms. For details visit [email or web address]. °  °Also download the MyChart app! Go to the app store, search "MyChart", open the app, select [facility], and log in with your MyChart username and password. ° °Due to Covid, a mask is required upon entering the hospital/clinic. If you do not have a mask, one will be given to you upon arrival. For doctor visits, patients may have 1 support person aged 18 or older with them. For treatment visits, patients cannot have anyone with them due to current Covid guidelines and our immunocompromised population.  ° °

## 2021-08-11 ENCOUNTER — Other Ambulatory Visit: Payer: Self-pay | Admitting: Hematology and Oncology

## 2021-09-13 ENCOUNTER — Other Ambulatory Visit: Payer: Self-pay | Admitting: Hematology and Oncology

## 2021-09-17 ENCOUNTER — Other Ambulatory Visit: Payer: Self-pay | Admitting: Hematology and Oncology

## 2021-09-17 DIAGNOSIS — I10 Essential (primary) hypertension: Secondary | ICD-10-CM

## 2021-09-19 ENCOUNTER — Inpatient Hospital Stay (HOSPITAL_BASED_OUTPATIENT_CLINIC_OR_DEPARTMENT_OTHER): Payer: Medicare Other | Admitting: Hematology and Oncology

## 2021-09-19 ENCOUNTER — Inpatient Hospital Stay: Payer: Medicare Other

## 2021-09-19 ENCOUNTER — Inpatient Hospital Stay: Payer: Medicare Other | Attending: Hematology and Oncology

## 2021-09-19 ENCOUNTER — Other Ambulatory Visit: Payer: Self-pay

## 2021-09-19 DIAGNOSIS — C7951 Secondary malignant neoplasm of bone: Secondary | ICD-10-CM | POA: Diagnosis not present

## 2021-09-19 DIAGNOSIS — Z5112 Encounter for antineoplastic immunotherapy: Secondary | ICD-10-CM | POA: Insufficient documentation

## 2021-09-19 DIAGNOSIS — Z170 Estrogen receptor positive status [ER+]: Secondary | ICD-10-CM | POA: Diagnosis not present

## 2021-09-19 DIAGNOSIS — Z7189 Other specified counseling: Secondary | ICD-10-CM

## 2021-09-19 DIAGNOSIS — C50411 Malignant neoplasm of upper-outer quadrant of right female breast: Secondary | ICD-10-CM | POA: Insufficient documentation

## 2021-09-19 DIAGNOSIS — Z95828 Presence of other vascular implants and grafts: Secondary | ICD-10-CM

## 2021-09-19 LAB — CBC WITH DIFFERENTIAL (CANCER CENTER ONLY)
Abs Immature Granulocytes: 0.01 10*3/uL (ref 0.00–0.07)
Basophils Absolute: 0 10*3/uL (ref 0.0–0.1)
Basophils Relative: 1 %
Eosinophils Absolute: 0.1 10*3/uL (ref 0.0–0.5)
Eosinophils Relative: 1 %
HCT: 34.1 % — ABNORMAL LOW (ref 36.0–46.0)
Hemoglobin: 11.3 g/dL — ABNORMAL LOW (ref 12.0–15.0)
Immature Granulocytes: 0 %
Lymphocytes Relative: 21 %
Lymphs Abs: 1.8 10*3/uL (ref 0.7–4.0)
MCH: 30.1 pg (ref 26.0–34.0)
MCHC: 33.1 g/dL (ref 30.0–36.0)
MCV: 90.9 fL (ref 80.0–100.0)
Monocytes Absolute: 0.4 10*3/uL (ref 0.1–1.0)
Monocytes Relative: 5 %
Neutro Abs: 6.4 10*3/uL (ref 1.7–7.7)
Neutrophils Relative %: 72 %
Platelet Count: 293 10*3/uL (ref 150–400)
RBC: 3.75 MIL/uL — ABNORMAL LOW (ref 3.87–5.11)
RDW: 13.2 % (ref 11.5–15.5)
WBC Count: 8.8 10*3/uL (ref 4.0–10.5)
nRBC: 0 % (ref 0.0–0.2)

## 2021-09-19 LAB — CMP (CANCER CENTER ONLY)
ALT: 10 U/L (ref 0–44)
AST: 11 U/L — ABNORMAL LOW (ref 15–41)
Albumin: 3.9 g/dL (ref 3.5–5.0)
Alkaline Phosphatase: 46 U/L (ref 38–126)
Anion gap: 6 (ref 5–15)
BUN: 16 mg/dL (ref 6–20)
CO2: 26 mmol/L (ref 22–32)
Calcium: 8.6 mg/dL — ABNORMAL LOW (ref 8.9–10.3)
Chloride: 109 mmol/L (ref 98–111)
Creatinine: 0.59 mg/dL (ref 0.44–1.00)
GFR, Estimated: >60 mL/min (ref >60)
Glucose, Bld: 173 mg/dL — ABNORMAL HIGH (ref 70–99)
Potassium: 3.1 mmol/L — ABNORMAL LOW (ref 3.5–5.1)
Sodium: 141 mmol/L (ref 135–145)
Total Bilirubin: 0.3 mg/dL (ref 0.3–1.2)
Total Protein: 7.6 g/dL (ref 6.5–8.1)

## 2021-09-19 MED ORDER — DIPHENHYDRAMINE HCL 25 MG PO CAPS
25.0000 mg | ORAL_CAPSULE | Freq: Once | ORAL | Status: AC
  Administered 2021-09-19: 25 mg via ORAL
  Filled 2021-09-19: qty 1

## 2021-09-19 MED ORDER — HEPARIN SOD (PORK) LOCK FLUSH 100 UNIT/ML IV SOLN
500.0000 [IU] | Freq: Once | INTRAVENOUS | Status: AC | PRN
  Administered 2021-09-19: 500 [IU]

## 2021-09-19 MED ORDER — ACETAMINOPHEN 325 MG PO TABS
650.0000 mg | ORAL_TABLET | Freq: Once | ORAL | Status: AC
  Administered 2021-09-19: 650 mg via ORAL
  Filled 2021-09-19: qty 2

## 2021-09-19 MED ORDER — SODIUM CHLORIDE 0.9% FLUSH
10.0000 mL | INTRAVENOUS | Status: DC | PRN
  Administered 2021-09-19: 10 mL

## 2021-09-19 MED ORDER — SODIUM CHLORIDE 0.9 % IV SOLN: Freq: Once | INTRAVENOUS | Status: AC

## 2021-09-19 MED ORDER — TRASTUZUMAB-ANNS CHEMO 150 MG IV SOLR
6.0000 mg/kg | Freq: Once | INTRAVENOUS | Status: AC
  Administered 2021-09-19: 336 mg via INTRAVENOUS
  Filled 2021-09-19: qty 16

## 2021-09-19 NOTE — Progress Notes (Signed)
Ok to treat with echo from 06/05/21 per Dr. Lindi Adie. ?

## 2021-09-19 NOTE — Assessment & Plan Note (Signed)
05/01/18:?Bilateral mastectomies: Left mastectomy: DCIS intermediate grade 0.9 cm margins negative Tis NX stage 0; right mastectomy: ILC, grade 3, 3.5 cm, with LCIS, perineural invasion present, margins negative, 15/22 lymph nodes positive with extracapsular extension, ER 90%, PR 0%, HER-2 +(IHC 3+), Ki-67 5%, T2N3M1?Stage IV ?? ?06/03/2018 bone scan: Increased tracer uptake in the lumbar spine at L1, L3 and L4, right fourth and seventh ribs, inferior right scapula and distal sternum, T7 and T11 as well. ? ?CT CAP?showed widespread bone metastases with possible pathologic fracture at L3, peripheral right upper lobe nodules nonspecific ?Patient has cerebral palsy and her?sister?is the power of attorney for healthcare ?Treatment plan: Palliative treatment with Taxol?x12 cycles completed 08/26/2018,?Kingenti, Perjeta ?------------------------------------------------------------------------------------------------------------------------ ?PET CT scan:?10/27/2018:?Widespread bone metastatic disease demonstrates reduced with sclerosis and low metabolic activity with SUV of 3-4. ?Left level 4 lymph node 0.5 cm and left deep parotid lymph node SUV 4.1 will need follow-up. ?Uterine leiomyomas. ?? ?Current treatment:?Kingenti maintenance along with anastrozole ?? ?Anastrozole toxicities: Denies any hot flashes or myalgias. ?Kingenti toxicities: None ?  ?? ?PET/CT 11/11/2019: Continued mild hypermetabolic activity left supraclavicular lymph node 0.7 cm, old sclerotic bone lesions are stable and not hypermetabolic. ?Uterine fibroids ?? ?CT CAP 05/17/2021: No new or progressive findings in chest abdomen or pelvis.  Areas of subtle sclerosis remain in the spine and the pelvis compatible with treated metastatic disease. ?? ?Return to clinic every?6 weeks for Kingenti? ?I will see her back in 3 months. ?? ?

## 2021-09-19 NOTE — Patient Instructions (Signed)
[facility]  Discharge Instructions: °Thank you for choosing [facility] to provide your oncology and hematology care.  ° °If you have a lab appointment with the Cancer Center, please go directly to the Cancer Center and check in at the registration area. °  °Wear comfortable clothing and clothing appropriate for easy access to any Portacath or PICC line.  ° °We strive to give you quality time with your provider. You may need to reschedule your appointment if you arrive late (15 or more minutes).  Arriving late affects you and other patients whose appointments are after yours.  Also, if you miss three or more appointments without notifying the office, you may be dismissed from the clinic at the provider’s discretion.    °  °For prescription refill requests, have your pharmacy contact our office and allow 72 hours for refills to be completed.   ° °Today you received the following chemotherapy and/or immunotherapy agents: Kanjinti °  °To help prevent nausea and vomiting after your treatment, we encourage you to take your nausea medication as directed. ° °BELOW ARE SYMPTOMS THAT SHOULD BE REPORTED IMMEDIATELY: °*FEVER GREATER THAN 100.4 F (38 °C) OR HIGHER °*CHILLS OR SWEATING °*NAUSEA AND VOMITING THAT IS NOT CONTROLLED WITH YOUR NAUSEA MEDICATION °*UNUSUAL SHORTNESS OF BREATH °*UNUSUAL BRUISING OR BLEEDING °*URINARY PROBLEMS (pain or burning when urinating, or frequent urination) °*BOWEL PROBLEMS (unusual diarrhea, constipation, pain near the anus) °TENDERNESS IN MOUTH AND THROAT WITH OR WITHOUT PRESENCE OF ULCERS (sore throat, sores in mouth, or a toothache) °UNUSUAL RASH, SWELLING OR PAIN  °UNUSUAL VAGINAL DISCHARGE OR ITCHING  ° °Items with * indicate a potential emergency and should be followed up as soon as possible or go to the Emergency Department if any problems should occur. ° °Please show the CHEMOTHERAPY ALERT CARD or IMMUNOTHERAPY ALERT CARD at check-in to the  Emergency Department and triage nurse. ° °Should you have questions after your visit or need to cancel or reschedule your appointment, please contact [facility]  Dept: [phone]  and follow the prompts.  Office hours are 8:00 a.m. to 4:30 p.m. Monday - Friday. Please note that voicemails left after 4:00 p.m. may not be returned until the following business day.  We are closed weekends and major holidays. You have access to a nurse at all times for urgent questions. Please call the main number to the clinic Dept: [phone] and follow the prompts. ° ° °For any non-urgent questions, you may also contact your provider using MyChart. We now offer e-Visits for anyone 18 and older to request care online for non-urgent symptoms. For details visit [email or web address]. °  °Also download the MyChart app! Go to the app store, search "MyChart", open the app, select [facility], and log in with your MyChart username and password. ° °Due to Covid, a mask is required upon entering the hospital/clinic. If you do not have a mask, one will be given to you upon arrival. For doctor visits, patients may have 1 support person aged 18 or older with them. For treatment visits, patients cannot have anyone with them due to current Covid guidelines and our immunocompromised population.  ° °

## 2021-09-19 NOTE — Progress Notes (Signed)
? ?Patient Care Team: ?Janora Norlander, DO as PCP - General (Family Medicine) ?Fields, Marga Melnick, MD (Inactive) (Gastroenterology) ?Alphonsa Overall, MD as Consulting Physician (General Surgery) ?Nicholas Lose, MD as Consulting Physician (Hematology and Oncology) ?Kyung Rudd, MD as Consulting Physician (Radiation Oncology) ?Mauro Kaufmann, RN as Oncology Nurse Navigator ?Rockwell Germany, RN as Oncology Nurse Navigator ? ?DIAGNOSIS:  ?Encounter Diagnosis  ?Name Primary?  ? Malignant neoplasm of upper-outer quadrant of right breast in female, estrogen receptor positive ([name])   ? ? ?SUMMARY OF ONCOLOGIC HISTORY: ?Oncology History  ?Malignant neoplasm of upper-outer quadrant of right breast in female, estrogen receptor positive ([name])  ?03/18/2018 Initial Diagnosis  ? Screening detected bilateral breast abnormalities.  Right breast mass UOQ 2.1 cm at 10 o'clock position: Grade 1 ILC ER 70%, PR 0%, HER-2 +3+ by IHC, Ki-67 5%, suspected lymph node in the axilla could not be biopsied; left breast calcifications by ultrasound not visible but 5 cm cystic/tubular structure which on biopsy was fibrocystic change with ADHl; T2N0 stage IIa clinical stage ? ?  ?05/01/2018 Surgery  ? Bilateral mastectomies: Left mastectomy: DCIS intermediate grade 0.9 cm margins negative Tis NX stage 0; right mastectomy: ILC, grade 3, 3.5 cm, with LCIS, perineural invasion present, margins negative, 15/22 lymph nodes positive with extracapsular extension, ER 90%, PR 0%, HER-2 +(IHC 3+), Ki-67 5%, T2N3 Stage 3B ? ? ?  ?05/11/2018 Cancer Staging  ? Staging form: Breast, AJCC 8th Edition ?- Pathologic stage from 05/11/2018: Stage IIIB (pT2, pN3, cM0, G3, ER+, PR-, HER2+) - Signed by Nicholas Lose, MD on 05/11/2018 ? ?  ?06/03/2018 Imaging  ? Bone scan: Increased tracer uptake in the lumbar spine at L1, L3 and L4, right fourth and seventh ribs, inferior right scapula and distal sternum, T7 and T11 as well.   ?CT CAP showed widespread bone metastases  with possible pathologic fracture at L3, peripheral right upper lobe nodules nonspecific ? ?  ?06/10/2018 -  Chemotherapy  ? Taxol Kingenti and Perjeta ? ? ?  ?10/27/2018 PET scan  ? Bone metastases less metabolically active.  Couple of lymph nodes with low-level metabolic activity noted. ?  ? ? ?CHIEF COMPLIANT:  Follow-up of metastatic breast cancer on herceptin ? ?INTERVAL HISTORY: [name] is a 56 y.o. with above-mentioned history of metastatic breast cancer currently on treatment with maintenance Kingenti and on anti-estrogen therapy with anastrozole. She presents to the clinic today for treatment. She state the she is tolerating the anastrozole. She state that she has not been eating due to trying to loose weight. She complains of pain from the fibroid. Overall everything is going good. ? ? ?ALLERGIES:  is allergic to hydrocodone. ? ?MEDICATIONS:  ?Current Outpatient Medications  ?Medication Sig Dispense Refill  ? diphenoxylate-atropine (LOMOTIL) 2.5-0.025 MG tablet TAKE 1 TO 2 TABLETS BY MOUTH 4 TIMES A DAY AS NEEDED FOR DIARRHEA 40 tablet 1  ? ibuprofen (ADVIL,MOTRIN) 600 MG tablet Take 1 tablet (600 mg total) by mouth every 6 (six) hours as needed (for mild pain not relieved by other medications.). 30 tablet 0  ? lisinopril (ZESTRIL) 40 MG tablet TAKE 1 TABLET BY MOUTH EVERY DAY 90 tablet 3  ? megestrol (MEGACE) 40 MG tablet TAKE 3 TABLETS EVERY DAY 270 tablet 11  ? megestrol (MEGACE) 40 MG tablet TAKE 1 TABLET (40 MG TOTAL) BY MOUTH 2 (TWO) TIMES DAILY. TAKE 3 TABLETS EVERY DAY 180 tablet 3  ? ondansetron (ZOFRAN-ODT) 4 MG disintegrating tablet Take 1 tablet (4 mg  total) by mouth every 6 (six) hours as needed for nausea. 15 tablet 0  ? pantoprazole (PROTONIX) 40 MG tablet TAKE 1 TABLET BY MOUTH 30 MINUTES PRIOR TO BREAKFAST AND SUPPER, 180 tablet 3  ? polyethylene glycol (MIRALAX / GLYCOLAX) packet Take 17 g by mouth daily as needed for moderate constipation.    ? potassium chloride (KLOR-CON) 10 MEQ  tablet TAKE 1 TABLET BY MOUTH EVERY DAY 30 tablet 1  ? pravastatin (PRAVACHOL) 40 MG tablet TAKE 1 TABLET BY MOUTH EVERY DAY 90 tablet 3  ? tamoxifen (NOLVADEX) 20 MG tablet TAKE 1 TABLET BY MOUTH EVERY DAY 90 tablet 3  ? ?No current facility-administered medications for this visit.  ? ? ?PHYSICAL EXAMINATION: ?ECOG PERFORMANCE STATUS: 1 - Symptomatic but completely ambulatory ? ?Vitals:  ? 09/19/21 0946  ?BP: 137/84  ?Pulse: 88  ?Resp: 18  ?Temp: (!) 97.2 ?F (36.2 ?C)  ?SpO2: 99%  ? ?Filed Weights  ? 09/19/21 0946  ?Weight: 116 lb 8 oz (52.8 kg)  ?  ? ?LABORATORY DATA:  ?I have reviewed the data as listed ? ?  Latest Ref Rng & Units 06/27/2021  ?  8:25 AM 05/16/2021  ?  8:40 AM 04/04/2021  ? 10:48 AM  ?CMP  ?Glucose 70 - 99 mg/dL 151   166   125    ?BUN 6 - 20 mg/dL _0 ?Creatinine 0.44 - 1.00 mg/dL 0.58   0.64   0.66    ?Sodium 135 - 145 mmol/L 136   138   140    ?Potassium 3.5 - 5.1 mmol/L 3.4   3.7   3.6    ?Chloride 98 - 111 mmol/L 107   106   109    ?CO2 22 - 32 mmol/L _1 ?Calcium 8.9 - 10.3 mg/dL 8.6   8.8   9.0    ?Total Protein 6.5 - 8.1 g/dL 7.4   7.8   7.8    ?Total Bilirubin 0.3 - 1.2 mg/dL 0.5   0.3   0.3    ?Alkaline Phos 38 - 126 U/L 47   55   59    ?AST 15 - 41 U/L _2 ?ALT 0 - 44 U/L _3 ? ? ?Lab Results  ?Component Value Date  ? WBC 8.8 09/19/2021  ? HGB 11.3 (L) 09/19/2021  ? HCT 34.1 (L) 09/19/2021  ? MCV 90.9 09/19/2021  ? PLT 293 09/19/2021  ? NEUTROABS 6.4 09/19/2021  ? ? ?ASSESSMENT & PLAN:  ?Malignant neoplasm of upper-outer quadrant of right breast in female, estrogen receptor positive ([name]) ?05/01/18: Bilateral mastectomies: Left mastectomy: DCIS intermediate grade 0.9 cm margins negative Tis NX stage 0; right mastectomy: ILC, grade 3, 3.5 cm, with LCIS, perineural invasion present, margins negative, 15/22 lymph nodes positive with extracapsular extension, ER 90%, PR 0%, HER-2 +(IHC 3+), Ki-67 5%, T2N3M1 Stage IV ?  ?06/03/2018 bone scan:  Increased tracer uptake in the lumbar spine at L1, L3 and L4, right fourth and seventh ribs, inferior right scapula and distal sternum, T7 and T11 as well.   ?CT CAP showed widespread bone metastases with possible pathologic fracture at L3, peripheral right upper lobe nodules nonspecific ?Patient has cerebral palsy and her sister is the power of attorney for healthcare ?Treatment plan: Palliative treatment with Taxol x12 cycles  completed 08/26/2018, Kingenti, Perjeta ?------------------------------------------------------------------------------------------------------------------------ ?PET CT scan: 10/27/2018: Widespread bone metastatic disease demonstrates reduced with sclerosis and low metabolic activity with SUV of 3-4.  Left level 4 lymph node 0.5 cm and left deep parotid lymph node SUV 4.1 will need follow-up.  Uterine leiomyomas. ?  ?Current treatment: Kingenti maintenance along with anastrozole ?  ?Anastrozole toxicities: Denies any hot flashes or myalgias. ?Kingenti toxicities: None ?  ?  ?PET/CT 11/11/2019: Continued mild hypermetabolic activity left supraclavicular lymph node 0.7 cm, old sclerotic bone lesions are stable and not hypermetabolic.  Uterine fibroids ?  ?CT CAP 05/17/2021: No new or progressive findings in chest abdomen or pelvis.  Areas of subtle sclerosis remain in the spine and the pelvis compatible with treated metastatic disease. ?  ?Return to clinic every 6 weeks for Kingenti  ?I will see her back in 3 months. ?Scans to be done in January 2024 ?  ? ? ?No orders of the defined types were placed in this encounter. ? ?The patient has a good understanding of the overall plan. she agrees with it. she will call with any problems that may develop before the next visit here. ?Total time spent: 30 mins including face to face time and time spent for planning, charting and co-ordination of care ? ? Harriette Ohara, MD ?09/19/21 ? ? ? I Gardiner Coins am scribing for Dr. Lindi Adie ? ?I have reviewed the  above documentation for accuracy and completeness, and I agree with the above. ?  ?

## 2021-10-11 ENCOUNTER — Other Ambulatory Visit: Payer: Self-pay | Admitting: Hematology and Oncology

## 2021-10-31 ENCOUNTER — Inpatient Hospital Stay: Payer: Medicare Other | Attending: Hematology and Oncology

## 2021-10-31 ENCOUNTER — Other Ambulatory Visit: Payer: Self-pay

## 2021-10-31 ENCOUNTER — Other Ambulatory Visit: Payer: Self-pay | Admitting: *Deleted

## 2021-10-31 VITALS — BP 138/84 | HR 89 | Temp 98.9°F | Resp 20

## 2021-10-31 DIAGNOSIS — C50411 Malignant neoplasm of upper-outer quadrant of right female breast: Secondary | ICD-10-CM | POA: Diagnosis not present

## 2021-10-31 DIAGNOSIS — C7951 Secondary malignant neoplasm of bone: Secondary | ICD-10-CM | POA: Insufficient documentation

## 2021-10-31 DIAGNOSIS — Z170 Estrogen receptor positive status [ER+]: Secondary | ICD-10-CM | POA: Insufficient documentation

## 2021-10-31 DIAGNOSIS — Z7189 Other specified counseling: Secondary | ICD-10-CM

## 2021-10-31 DIAGNOSIS — Z5112 Encounter for antineoplastic immunotherapy: Secondary | ICD-10-CM | POA: Diagnosis not present

## 2021-10-31 DIAGNOSIS — Z5181 Encounter for therapeutic drug level monitoring: Secondary | ICD-10-CM

## 2021-10-31 MED ORDER — SODIUM CHLORIDE 0.9% FLUSH
10.0000 mL | INTRAVENOUS | Status: DC | PRN
  Administered 2021-10-31: 10 mL

## 2021-10-31 MED ORDER — TRASTUZUMAB-ANNS CHEMO 150 MG IV SOLR
6.0000 mg/kg | Freq: Once | INTRAVENOUS | Status: AC
  Administered 2021-10-31: 336 mg via INTRAVENOUS
  Filled 2021-10-31: qty 16

## 2021-10-31 MED ORDER — SODIUM CHLORIDE 0.9 % IV SOLN: Freq: Once | INTRAVENOUS | Status: AC

## 2021-10-31 MED ORDER — ACETAMINOPHEN 325 MG PO TABS
650.0000 mg | ORAL_TABLET | Freq: Once | ORAL | Status: AC
  Administered 2021-10-31: 650 mg via ORAL
  Filled 2021-10-31: qty 2

## 2021-10-31 MED ORDER — HEPARIN SOD (PORK) LOCK FLUSH 100 UNIT/ML IV SOLN
500.0000 [IU] | Freq: Once | INTRAVENOUS | Status: AC | PRN
  Administered 2021-10-31: 500 [IU]

## 2021-10-31 MED ORDER — DIPHENHYDRAMINE HCL 25 MG PO CAPS
25.0000 mg | ORAL_CAPSULE | Freq: Once | ORAL | Status: AC
  Administered 2021-10-31: 25 mg via ORAL
  Filled 2021-10-31: qty 1

## 2021-10-31 NOTE — Progress Notes (Signed)
Per Dr Lindi Adie ok to proceed with echo from 06/05/21

## 2021-11-03 ENCOUNTER — Other Ambulatory Visit: Payer: Self-pay | Admitting: Hematology and Oncology

## 2021-11-19 ENCOUNTER — Ambulatory Visit (HOSPITAL_COMMUNITY)
Admission: RE | Admit: 2021-11-19 | Discharge: 2021-11-19 | Disposition: A | Discharge status: Home / Self Care | Payer: Medicare Other | Source: Ambulatory Visit | Attending: Hematology and Oncology | Admitting: Hematology and Oncology

## 2021-11-19 DIAGNOSIS — Z5111 Encounter for antineoplastic chemotherapy: Secondary | ICD-10-CM | POA: Insufficient documentation

## 2021-11-19 DIAGNOSIS — Z5181 Encounter for therapeutic drug level monitoring: Secondary | ICD-10-CM | POA: Diagnosis not present

## 2021-11-19 DIAGNOSIS — Z79899 Other long term (current) drug therapy: Secondary | ICD-10-CM

## 2021-11-19 DIAGNOSIS — C50919 Malignant neoplasm of unspecified site of unspecified female breast: Secondary | ICD-10-CM | POA: Insufficient documentation

## 2021-11-19 DIAGNOSIS — Z0189 Encounter for other specified special examinations: Secondary | ICD-10-CM

## 2021-11-19 DIAGNOSIS — E785 Hyperlipidemia, unspecified: Secondary | ICD-10-CM | POA: Insufficient documentation

## 2021-11-19 DIAGNOSIS — I10 Essential (primary) hypertension: Secondary | ICD-10-CM | POA: Insufficient documentation

## 2021-11-19 DIAGNOSIS — G809 Cerebral palsy, unspecified: Secondary | ICD-10-CM | POA: Insufficient documentation

## 2021-11-19 LAB — ECHOCARDIOGRAM COMPLETE
Area-P 1/2: 4.74 cm2
Calc EF: 64.5 %
S' Lateral: 2.1 cm
Single Plane A2C EF: 69.7 %
Single Plane A4C EF: 59.9 %

## 2021-11-19 NOTE — Progress Notes (Signed)
  Echocardiogram 2D Echocardiogram has been performed.  [name] 11/19/2021, 11:42 AM

## 2021-12-03 ENCOUNTER — Other Ambulatory Visit: Payer: Self-pay

## 2021-12-07 ENCOUNTER — Other Ambulatory Visit: Payer: Self-pay

## 2021-12-07 NOTE — Progress Notes (Signed)
 Patient Care Team: Gottschalk, Ashly M, DO as PCP - General (Family Medicine) Fields, Sandi L, MD (Inactive) (Gastroenterology) Newman, David, MD as Consulting Physician (General Surgery) Gudena, Vinay, MD as Consulting Physician (Hematology and Oncology) Moody, John, MD as Consulting Physician (Radiation Oncology) Stuart, Dawn C, RN as Oncology Nurse Navigator Martini, Keisha N, RN as Oncology Nurse Navigator  DIAGNOSIS: No diagnosis found.  SUMMARY OF ONCOLOGIC HISTORY: Oncology History  Malignant neoplasm of upper-outer quadrant of right breast in female, estrogen receptor positive ([facility])  03/18/2018 Initial Diagnosis   Screening detected bilateral breast abnormalities.  Right breast mass UOQ 2.1 cm at 10 o'clock position: Grade 1 ILC ER 70%, PR 0%, HER-2 +3+ by IHC, Ki-67 5%, suspected lymph node in the axilla could not be biopsied; left breast calcifications by ultrasound not visible but 5 cm cystic/tubular structure which on biopsy was fibrocystic change with ADHl; T2N0 stage IIa clinical stage   05/01/2018 Surgery   Bilateral mastectomies: Left mastectomy: DCIS intermediate grade 0.9 cm margins negative Tis NX stage 0; right mastectomy: ILC, grade 3, 3.5 cm, with LCIS, perineural invasion present, margins negative, 15/22 lymph nodes positive with extracapsular extension, ER 90%, PR 0%, HER-2 +(IHC 3+), Ki-67 5%, T2N3 Stage 3B    05/11/2018 Cancer Staging   Staging form: Breast, AJCC 8th Edition - Pathologic stage from 05/11/2018: Stage IIIB (pT2, pN3, cM0, G3, ER+, PR-, HER2+) - Signed by Gudena, Vinay, MD on 05/11/2018   06/03/2018 Imaging   Bone scan: Increased tracer uptake in the lumbar spine at L1, L3 and L4, right fourth and seventh ribs, inferior right scapula and distal sternum, T7 and T11 as well.   CT CAP showed widespread bone metastases with possible pathologic fracture at L3, peripheral right upper lobe nodules nonspecific   06/10/2018 -  Chemotherapy   Taxol  Kingenti and Perjeta    10/27/2018 PET scan   Bone metastases less metabolically active.  Couple of lymph nodes with low-level metabolic activity noted.     CHIEF COMPLIANT: Follow-up of metastatic breast cancer on herceptin    INTERVAL HISTORY: [name] is a 56 y.o. with above-mentioned history of metastatic breast cancer currently on treatment with maintenance Kingenti and on anti-estrogen therapy with anastrozole. She presents to the clinic today for a follow-up.   ALLERGIES:  is allergic to hydrocodone.  MEDICATIONS:  Current Outpatient Medications  Medication Sig Dispense Refill   diphenoxylate-atropine (LOMOTIL) 2.5-0.025 MG tablet TAKE 1 TO 2 TABLETS BY MOUTH 4 TIMES A DAY AS NEEDED FOR DIARRHEA 40 tablet 1   ibuprofen (ADVIL,MOTRIN) 600 MG tablet Take 1 tablet (600 mg total) by mouth every 6 (six) hours as needed (for mild pain not relieved by other medications.). 30 tablet 0   lisinopril (ZESTRIL) 40 MG tablet TAKE 1 TABLET BY MOUTH EVERY DAY 90 tablet 3   megestrol (MEGACE) 40 MG tablet TAKE 3 TABLETS EVERY DAY 270 tablet 11   megestrol (MEGACE) 40 MG tablet TAKE 1 TABLET (40 MG TOTAL) BY MOUTH 2 (TWO) TIMES DAILY. TAKE 3 TABLETS EVERY DAY 180 tablet 3   ondansetron (ZOFRAN-ODT) 4 MG disintegrating tablet Take 1 tablet (4 mg total) by mouth every 6 (six) hours as needed for nausea. 15 tablet 0   pantoprazole (PROTONIX) 40 MG tablet TAKE 1 TABLET BY MOUTH 30 MINUTES PRIOR TO BREAKFAST AND SUPPER, 180 tablet 3   polyethylene glycol (MIRALAX / GLYCOLAX) packet Take 17 g by mouth daily as needed for moderate constipation.       potassium chloride (KLOR-CON) 10 MEQ tablet TAKE 1 TABLET BY MOUTH EVERY DAY 30 tablet 1   pravastatin (PRAVACHOL) 40 MG tablet TAKE 1 TABLET BY MOUTH EVERY DAY 90 tablet 3   tamoxifen (NOLVADEX) 20 MG tablet TAKE 1 TABLET BY MOUTH EVERY DAY 90 tablet 3   No current facility-administered medications for this visit.    PHYSICAL EXAMINATION: ECOG  PERFORMANCE STATUS: {CHL ONC ECOG PS:[phone]}  There were no vitals filed for this visit. There were no vitals filed for this visit.  BREAST:*** No palpable masses or nodules in either right or left breasts. No palpable axillary supraclavicular or infraclavicular adenopathy no breast tenderness or nipple discharge. (exam performed in the presence of a chaperone)  LABORATORY DATA:  I have reviewed the data as listed    Latest Ref Rng & Units 09/19/2021    9:17 AM 06/27/2021    8:25 AM 05/16/2021    8:40 AM  CMP  Glucose 70 - 99 mg/dL 173  151  166   BUN 6 - 20 mg/dL _0 Creatinine 0.44 - 1.00 mg/dL 0.59  0.58  0.64   Sodium 135 - 145 mmol/L 141  136  138   Potassium 3.5 - 5.1 mmol/L 3.1  3.4  3.7   Chloride 98 - 111 mmol/L 109  107  106   CO2 22 - 32 mmol/L _1 Calcium 8.9 - 10.3 mg/dL 8.6  8.6  8.8   Total Protein 6.5 - 8.1 g/dL 7.6  7.4  7.8   Total Bilirubin 0.3 - 1.2 mg/dL 0.3  0.5  0.3   Alkaline Phos 38 - 126 U/L 46  47  55   AST 15 - 41 U/L _2 ALT 0 - 44 U/L _3 Lab Results  Component Value Date   WBC 8.8 09/19/2021   HGB 11.3 (L) 09/19/2021   HCT 34.1 (L) 09/19/2021   MCV 90.9 09/19/2021   PLT 293 09/19/2021   NEUTROABS 6.4 09/19/2021    ASSESSMENT & PLAN:  No problem-specific Assessment & Plan notes found for this encounter.    No orders of the defined types were placed in this encounter.  The patient has a good understanding of the overall plan. she agrees with it. she will call with any problems that may develop before the next visit here. Total time spent: 30 mins including face to face time and time spent for planning, charting and co-ordination of care   Suzzette Righter, [place] 12/07/21    I Gardiner Coins am scribing for Dr. Lindi Adie  ***

## 2021-12-08 ENCOUNTER — Other Ambulatory Visit: Payer: Self-pay | Admitting: Hematology and Oncology

## 2021-12-10 ENCOUNTER — Other Ambulatory Visit: Payer: Self-pay

## 2021-12-12 ENCOUNTER — Inpatient Hospital Stay: Payer: Medicare Other

## 2021-12-12 ENCOUNTER — Other Ambulatory Visit: Payer: Self-pay

## 2021-12-12 ENCOUNTER — Inpatient Hospital Stay: Payer: Medicare Other | Attending: Hematology and Oncology

## 2021-12-12 ENCOUNTER — Inpatient Hospital Stay (HOSPITAL_BASED_OUTPATIENT_CLINIC_OR_DEPARTMENT_OTHER): Payer: Medicare Other | Admitting: Hematology and Oncology

## 2021-12-12 VITALS — HR 88

## 2021-12-12 DIAGNOSIS — Z170 Estrogen receptor positive status [ER+]: Secondary | ICD-10-CM | POA: Diagnosis not present

## 2021-12-12 DIAGNOSIS — C7951 Secondary malignant neoplasm of bone: Secondary | ICD-10-CM | POA: Diagnosis not present

## 2021-12-12 DIAGNOSIS — C50411 Malignant neoplasm of upper-outer quadrant of right female breast: Secondary | ICD-10-CM

## 2021-12-12 DIAGNOSIS — Z79811 Long term (current) use of aromatase inhibitors: Secondary | ICD-10-CM | POA: Diagnosis not present

## 2021-12-12 DIAGNOSIS — Z5112 Encounter for antineoplastic immunotherapy: Secondary | ICD-10-CM | POA: Insufficient documentation

## 2021-12-12 DIAGNOSIS — Z7189 Other specified counseling: Secondary | ICD-10-CM

## 2021-12-12 DIAGNOSIS — Z95828 Presence of other vascular implants and grafts: Secondary | ICD-10-CM

## 2021-12-12 LAB — CBC WITH DIFFERENTIAL (CANCER CENTER ONLY)
Abs Immature Granulocytes: 0.01 10*3/uL (ref 0.00–0.07)
Basophils Absolute: 0 10*3/uL (ref 0.0–0.1)
Basophils Relative: 0 %
Eosinophils Absolute: 0.1 10*3/uL (ref 0.0–0.5)
Eosinophils Relative: 1 %
HCT: 36.2 % (ref 36.0–46.0)
Hemoglobin: 12.2 g/dL (ref 12.0–15.0)
Immature Granulocytes: 0 %
Lymphocytes Relative: 27 %
Lymphs Abs: 2.4 10*3/uL (ref 0.7–4.0)
MCH: 30.7 pg (ref 26.0–34.0)
MCHC: 33.7 g/dL (ref 30.0–36.0)
MCV: 91.2 fL (ref 80.0–100.0)
Monocytes Absolute: 0.5 10*3/uL (ref 0.1–1.0)
Monocytes Relative: 6 %
Neutro Abs: 5.9 10*3/uL (ref 1.7–7.7)
Neutrophils Relative %: 66 %
Platelet Count: 237 10*3/uL (ref 150–400)
RBC: 3.97 MIL/uL (ref 3.87–5.11)
RDW: 13.2 % (ref 11.5–15.5)
Smear Review: NORMAL
WBC Count: 9 10*3/uL (ref 4.0–10.5)
nRBC: 0 % (ref 0.0–0.2)

## 2021-12-12 LAB — CMP (CANCER CENTER ONLY)
ALT: 7 U/L (ref 0–44)
AST: 10 U/L — ABNORMAL LOW (ref 15–41)
Albumin: 4 g/dL (ref 3.5–5.0)
Alkaline Phosphatase: 51 U/L (ref 38–126)
Anion gap: 6 (ref 5–15)
BUN: 15 mg/dL (ref 6–20)
CO2: 25 mmol/L (ref 22–32)
Calcium: 8.7 mg/dL — ABNORMAL LOW (ref 8.9–10.3)
Chloride: 106 mmol/L (ref 98–111)
Creatinine: 0.62 mg/dL (ref 0.44–1.00)
GFR, Estimated: >60 mL/min (ref >60)
Glucose, Bld: 154 mg/dL — ABNORMAL HIGH (ref 70–99)
Potassium: 3.8 mmol/L (ref 3.5–5.1)
Sodium: 137 mmol/L (ref 135–145)
Total Bilirubin: 0.3 mg/dL (ref 0.3–1.2)
Total Protein: 7.7 g/dL (ref 6.5–8.1)

## 2021-12-12 MED ORDER — SODIUM CHLORIDE 0.9% FLUSH
10.0000 mL | INTRAVENOUS | Status: DC | PRN
  Administered 2021-12-12: 10 mL

## 2021-12-12 MED ORDER — ACETAMINOPHEN 325 MG PO TABS
650.0000 mg | ORAL_TABLET | Freq: Once | ORAL | Status: AC
  Administered 2021-12-12: 650 mg via ORAL
  Filled 2021-12-12: qty 2

## 2021-12-12 MED ORDER — HEPARIN SOD (PORK) LOCK FLUSH 100 UNIT/ML IV SOLN
500.0000 [IU] | Freq: Once | INTRAVENOUS | Status: AC | PRN
  Administered 2021-12-12: 500 [IU]

## 2021-12-12 MED ORDER — TRASTUZUMAB-ANNS CHEMO 150 MG IV SOLR
6.0000 mg/kg | Freq: Once | INTRAVENOUS | Status: AC
  Administered 2021-12-12: 336 mg via INTRAVENOUS
  Filled 2021-12-12: qty 16

## 2021-12-12 MED ORDER — SODIUM CHLORIDE 0.9 % IV SOLN: Freq: Once | INTRAVENOUS | Status: AC

## 2021-12-12 MED ORDER — DIPHENHYDRAMINE HCL 25 MG PO CAPS
25.0000 mg | ORAL_CAPSULE | Freq: Once | ORAL | Status: AC
  Administered 2021-12-12: 25 mg via ORAL
  Filled 2021-12-12: qty 1

## 2021-12-12 NOTE — Assessment & Plan Note (Signed)
05/01/18:Bilateral mastectomies: Left mastectomy: DCIS intermediate grade 0.9 cm margins negative Tis NX stage 0; right mastectomy: ILC, grade 3, 3.5 cm, with LCIS, perineural invasion present, margins negative, 15/22 lymph nodes positive with extracapsular extension, ER 90%, PR 0%, HER-2 +(IHC 3+), Ki-67 5%, T2N3M1Stage IV  06/03/2018 bone scan: Increased tracer uptake in the lumbar spine at L1, L3 and L4, right fourth and seventh ribs, inferior right scapula and distal sternum, T7 and T11 as well.  CT CAPshowed widespread bone metastases with possible pathologic fracture at L3, peripheral right upper lobe nodules nonspecific Patient has cerebral palsy and hersisteris the power of attorney for healthcare Treatment plan: Palliative treatment with Taxolx12 cycles completed 08/26/2018,Kingenti, Perjeta ------------------------------------------------------------------------------------------------------------------------ PET CT scan:10/27/2018:Widespread bone metastatic disease demonstrates reduced with sclerosis and low metabolic activity with SUV of 3-4. Left level 4 lymph node 0.5 cm and left deep parotid lymph node SUV 4.1 will need follow-up. Uterine leiomyomas.  Current treatment:Kingenti maintenance along with anastrozole  Anastrozole toxicities: Denies any hot flashes or myalgias. Kingenti toxicities: None   PET/CT 11/11/2019: Continued mild hypermetabolic activity left supraclavicular lymph node 0.7 cm, old sclerotic bone lesions are stable and not hypermetabolic. Uterine fibroids  CT CAP 05/17/2021: No new or progressive findings in chest abdomen or pelvis. Areas of subtle sclerosis remain in the spine and the pelvis compatible with treated metastatic disease.  Return to clinic every6 weeks for Kingenti I will see her back in 3 months. Scans to be done in January 2024 

## 2021-12-12 NOTE — Patient Instructions (Signed)
[facility] MEDICAL [facility]COLOG[facility]  Discharge Instructions: °Thank you for choosing [facility] to provide your oncology and hematology care.  ° °If you have a lab appointment with the Cancer Center, please go directly to the Cancer Center and check in at the registration area. °  °Wear comfortable clothing and clothing appropriate for easy access to any Portacath or PICC line.  ° °We strive to give you quality time with your provider. You may need to reschedule your appointment if you arrive late (15 or more minutes).  Arriving late affects you and other patients whose appointments are after yours.  Also, if you miss three or more appointments without notifying the office, you may be dismissed from the clinic at the provider’s discretion.    °  °For prescription refill requests, have your pharmacy contact our office and allow 72 hours for refills to be completed.   ° °Today you received the following chemotherapy and/or immunotherapy agents: Kanjinti °  °To help prevent nausea and vomiting after your treatment, we encourage you to take your nausea medication as directed. ° °BELOW ARE SYMPTOMS THAT SHOULD BE REPORTED IMMEDIATELY: °*FEVER GREATER THAN 100.4 F (38 °C) OR HIGHER °*CHILLS OR SWEATING °*NAUSEA AND VOMITING THAT IS NOT CONTROLLED WITH YOUR NAUSEA MEDICATION °*UNUSUAL SHORTNESS OF BREATH °*UNUSUAL BRUISING OR BLEEDING °*URINARY PROBLEMS (pain or burning when urinating, or frequent urination) °*BOWEL PROBLEMS (unusual diarrhea, constipation, pain near the anus) °TENDERNESS IN MOUTH AND THROAT WITH OR WITHOUT PRESENCE OF ULCERS (sore throat, sores in mouth, or a toothache) °UNUSUAL RASH, SWELLING OR PAIN  °UNUSUAL VAGINAL DISCHARGE OR ITCHING  ° °Items with * indicate a potential emergency and should be followed up as soon as possible or go to the Emergency Department if any problems should occur. ° °Please show the CHEMOTHERAPY ALERT CARD or IMMUNOTHERAPY ALERT CARD at check-in to the  Emergency Department and triage nurse. ° °Should you have questions after your visit or need to cancel or reschedule your appointment, please contact [facility]  Dept: [phone]  and follow the prompts.  Office hours are 8:00 a.m. to 4:30 p.m. Monday - Friday. Please note that voicemails left after 4:00 p.m. may not be returned until the following business day.  We are closed weekends and major holidays. You have access to a nurse at all times for urgent questions. Please call the main number to the clinic Dept: [phone] and follow the prompts. ° ° °For any non-urgent questions, you may also contact your provider using MyChart. We now offer e-Visits for anyone 18 and older to request care online for non-urgent symptoms. For details visit [email or web address]. °  °Also download the MyChart app! Go to the app store, search "MyChart", open the app, select [facility], and log in with your MyChart username and password. ° °Due to Covid, a mask is required upon entering the hospital/clinic. If you do not have a mask, one will be given to you upon arrival. For doctor visits, patients may have 1 support [name] aged 58 or older with them. For treatment visits, patients cannot have anyone with them due to current Covid guidelines and our immunocompromised population.  ° °

## 2022-01-09 ENCOUNTER — Other Ambulatory Visit: Payer: Self-pay | Admitting: Hematology and Oncology

## 2022-01-23 ENCOUNTER — Inpatient Hospital Stay: Payer: Medicare Other | Attending: Hematology and Oncology

## 2022-01-23 ENCOUNTER — Other Ambulatory Visit: Payer: Self-pay

## 2022-01-23 VITALS — BP 158/82 | HR 75 | Temp 98.5°F | Resp 17

## 2022-01-23 DIAGNOSIS — Z79811 Long term (current) use of aromatase inhibitors: Secondary | ICD-10-CM | POA: Diagnosis not present

## 2022-01-23 DIAGNOSIS — C7951 Secondary malignant neoplasm of bone: Secondary | ICD-10-CM | POA: Insufficient documentation

## 2022-01-23 DIAGNOSIS — Z7189 Other specified counseling: Secondary | ICD-10-CM

## 2022-01-23 DIAGNOSIS — Z5112 Encounter for antineoplastic immunotherapy: Secondary | ICD-10-CM | POA: Insufficient documentation

## 2022-01-23 DIAGNOSIS — C50411 Malignant neoplasm of upper-outer quadrant of right female breast: Secondary | ICD-10-CM | POA: Diagnosis not present

## 2022-01-23 DIAGNOSIS — Z170 Estrogen receptor positive status [ER+]: Secondary | ICD-10-CM | POA: Insufficient documentation

## 2022-01-23 MED ORDER — HEPARIN SOD (PORK) LOCK FLUSH 100 UNIT/ML IV SOLN
500.0000 [IU] | Freq: Once | INTRAVENOUS | Status: AC | PRN
  Administered 2022-01-23: 500 [IU]

## 2022-01-23 MED ORDER — TRASTUZUMAB-ANNS CHEMO 150 MG IV SOLR
6.0000 mg/kg | Freq: Once | INTRAVENOUS | Status: AC
  Administered 2022-01-23: 336 mg via INTRAVENOUS
  Filled 2022-01-23: qty 16

## 2022-01-23 MED ORDER — DIPHENHYDRAMINE HCL 25 MG PO CAPS
25.0000 mg | ORAL_CAPSULE | Freq: Once | ORAL | Status: AC
  Administered 2022-01-23: 25 mg via ORAL
  Filled 2022-01-23: qty 1

## 2022-01-23 MED ORDER — SODIUM CHLORIDE 0.9% FLUSH
10.0000 mL | INTRAVENOUS | Status: DC | PRN
  Administered 2022-01-23: 10 mL

## 2022-01-23 MED ORDER — ACETAMINOPHEN 325 MG PO TABS
650.0000 mg | ORAL_TABLET | Freq: Once | ORAL | Status: AC
  Administered 2022-01-23: 650 mg via ORAL
  Filled 2022-01-23: qty 2

## 2022-01-23 MED ORDER — SODIUM CHLORIDE 0.9 % IV SOLN: Freq: Once | INTRAVENOUS | Status: AC

## 2022-01-23 NOTE — Patient Instructions (Signed)
[facility]COL[facility]  Discharge Instructions: °Thank you for choosing [facility] to provide your oncology and hematology care.  ° °If you have a lab appointment with the Cancer Center, please go directly to the Cancer Center and check in at the registration area. °  °Wear comfortable clothing and clothing appropriate for easy access to any Portacath or PICC line.  ° °We strive to give you quality time with your provider. You may need to reschedule your appointment if you arrive late (15 or more minutes).  Arriving late affects you and other patients whose appointments are after yours.  Also, if you miss three or more appointments without notifying the office, you may be dismissed from the clinic at the provider’s discretion.    °  °For prescription refill requests, have your pharmacy contact our office and allow 72 hours for refills to be completed.   ° °Today you received the following chemotherapy and/or immunotherapy agents: Kanjinti °  °To help prevent nausea and vomiting after your treatment, we encourage you to take your nausea medication as directed. ° °BELOW ARE SYMPTOMS THAT SHOULD BE REPORTED IMMEDIATELY: °*FEVER GREATER THAN 100.4 F (38 °C) OR HIGHER °*CHILLS OR SWEATING °*NAUSEA AND VOMITING THAT IS NOT CONTROLLED WITH YOUR NAUSEA MEDICATION °*UNUSUAL SHORTNESS OF BREATH °*UNUSUAL BRUISING OR BLEEDING °*URINARY PROBLEMS (pain or burning when urinating, or frequent urination) °*BOWEL PROBLEMS (unusual diarrhea, constipation, pain near the anus) °TENDERNESS IN MOUTH AND THROAT WITH OR WITHOUT PRESENCE OF ULCERS (sore throat, sores in mouth, or a toothache) °UNUSUAL RASH, SWELLING OR PAIN  °UNUSUAL VAGINAL DISCHARGE OR ITCHING  ° °Items with * indicate a potential emergency and should be followed up as soon as possible or go to the Emergency Department if any problems should occur. ° °Please show the CHEMOTHERAPY ALERT CARD or IMMUNOTHERAPY ALERT CARD at check-in to the  Emergency Department and triage nurse. ° °Should you have questions after your visit or need to cancel or reschedule your appointment, please contact [facility]  Dept: [phone]  and follow the prompts.  Office hours are 8:00 a.m. to 4:30 p.m. Monday - Friday. Please note that voicemails left after 4:00 p.m. may not be returned until the following business day.  We are closed weekends and major holidays. You have access to a nurse at all times for urgent questions. Please call the main number to the clinic Dept: [phone] and follow the prompts. ° ° °For any non-urgent questions, you may also contact your provider using MyChart. We now offer e-Visits for anyone 18 and older to request care online for non-urgent symptoms. For details visit [email or web address]. °  °Also download the MyChart app! Go to the app store, search "MyChart", open the app, select [facility], and log in with your MyChart username and password. ° °Due to Covid, a mask is required upon entering the hospital/clinic. If you do not have a mask, one will be given to you upon arrival. For doctor visits, patients may have 1 support person aged 18 or older with them. For treatment visits, patients cannot have anyone with them due to current Covid guidelines and our immunocompromised population.  ° °

## 2022-01-31 ENCOUNTER — Telehealth: Payer: Self-pay | Admitting: Hematology and Oncology

## 2022-01-31 NOTE — Telephone Encounter (Signed)
Rescheduled appointment per provider [facility]. Talked to the patients sister and she is aware of the changes made to the patients upcoming appointment.

## 2022-02-01 ENCOUNTER — Other Ambulatory Visit: Payer: Self-pay

## 2022-02-09 ENCOUNTER — Other Ambulatory Visit: Payer: Self-pay | Admitting: Hematology and Oncology

## 2022-02-11 ENCOUNTER — Other Ambulatory Visit: Payer: Self-pay

## 2022-02-13 ENCOUNTER — Other Ambulatory Visit: Payer: Self-pay | Admitting: Hematology and Oncology

## 2022-02-13 DIAGNOSIS — K219 Gastro-esophageal reflux disease without esophagitis: Secondary | ICD-10-CM

## 2022-02-17 ENCOUNTER — Other Ambulatory Visit: Payer: Self-pay

## 2022-03-06 ENCOUNTER — Ambulatory Visit: Payer: Medicare Other

## 2022-03-06 ENCOUNTER — Encounter: Payer: Self-pay | Admitting: Adult Health

## 2022-03-06 ENCOUNTER — Inpatient Hospital Stay (HOSPITAL_BASED_OUTPATIENT_CLINIC_OR_DEPARTMENT_OTHER): Payer: Medicare Other | Admitting: Adult Health

## 2022-03-06 ENCOUNTER — Ambulatory Visit: Payer: Medicare Other | Admitting: Hematology and Oncology

## 2022-03-06 ENCOUNTER — Inpatient Hospital Stay: Payer: Medicare Other | Attending: Hematology and Oncology

## 2022-03-06 ENCOUNTER — Inpatient Hospital Stay: Payer: Medicare Other

## 2022-03-06 ENCOUNTER — Other Ambulatory Visit: Payer: Medicare Other

## 2022-03-06 VITALS — BP 133/74 | HR 86 | Temp 97.5°F | Resp 19 | Ht 62.0 in | Wt 125.6 lb

## 2022-03-06 DIAGNOSIS — Z170 Estrogen receptor positive status [ER+]: Secondary | ICD-10-CM

## 2022-03-06 DIAGNOSIS — Z23 Encounter for immunization: Secondary | ICD-10-CM | POA: Diagnosis not present

## 2022-03-06 DIAGNOSIS — C50411 Malignant neoplasm of upper-outer quadrant of right female breast: Secondary | ICD-10-CM | POA: Diagnosis not present

## 2022-03-06 DIAGNOSIS — Z7189 Other specified counseling: Secondary | ICD-10-CM

## 2022-03-06 DIAGNOSIS — Z5112 Encounter for antineoplastic immunotherapy: Secondary | ICD-10-CM | POA: Diagnosis not present

## 2022-03-06 DIAGNOSIS — C7951 Secondary malignant neoplasm of bone: Secondary | ICD-10-CM | POA: Diagnosis not present

## 2022-03-06 DIAGNOSIS — Z95828 Presence of other vascular implants and grafts: Secondary | ICD-10-CM

## 2022-03-06 LAB — CBC WITH DIFFERENTIAL (CANCER CENTER ONLY)
Abs Immature Granulocytes: 0.02 10*3/uL (ref 0.00–0.07)
Basophils Absolute: 0.1 10*3/uL (ref 0.0–0.1)
Basophils Relative: 1 %
Eosinophils Absolute: 0.1 10*3/uL (ref 0.0–0.5)
Eosinophils Relative: 1 %
HCT: 35 % — ABNORMAL LOW (ref 36.0–46.0)
Hemoglobin: 12 g/dL (ref 12.0–15.0)
Immature Granulocytes: 0 %
Lymphocytes Relative: 25 %
Lymphs Abs: 2.1 10*3/uL (ref 0.7–4.0)
MCH: 31.2 pg (ref 26.0–34.0)
MCHC: 34.3 g/dL (ref 30.0–36.0)
MCV: 90.9 fL (ref 80.0–100.0)
Monocytes Absolute: 0.5 10*3/uL (ref 0.1–1.0)
Monocytes Relative: 6 %
Neutro Abs: 5.9 10*3/uL (ref 1.7–7.7)
Neutrophils Relative %: 67 %
Platelet Count: 281 10*3/uL (ref 150–400)
RBC: 3.85 MIL/uL — ABNORMAL LOW (ref 3.87–5.11)
RDW: 13.4 % (ref 11.5–15.5)
WBC Count: 8.6 10*3/uL (ref 4.0–10.5)
nRBC: 0 % (ref 0.0–0.2)

## 2022-03-06 LAB — CMP (CANCER CENTER ONLY)
ALT: 11 U/L (ref 0–44)
AST: 12 U/L — ABNORMAL LOW (ref 15–41)
Albumin: 3.9 g/dL (ref 3.5–5.0)
Alkaline Phosphatase: 51 U/L (ref 38–126)
Anion gap: 6 (ref 5–15)
BUN: 21 mg/dL — ABNORMAL HIGH (ref 6–20)
CO2: 26 mmol/L (ref 22–32)
Calcium: 9 mg/dL (ref 8.9–10.3)
Chloride: 109 mmol/L (ref 98–111)
Creatinine: 0.64 mg/dL (ref 0.44–1.00)
GFR, Estimated: >60 mL/min (ref >60)
Glucose, Bld: 146 mg/dL — ABNORMAL HIGH (ref 70–99)
Potassium: 3.9 mmol/L (ref 3.5–5.1)
Sodium: 141 mmol/L (ref 135–145)
Total Bilirubin: 0.3 mg/dL (ref 0.3–1.2)
Total Protein: 7.6 g/dL (ref 6.5–8.1)

## 2022-03-06 MED ORDER — DIPHENHYDRAMINE HCL 25 MG PO CAPS
25.0000 mg | ORAL_CAPSULE | Freq: Once | ORAL | Status: AC
  Administered 2022-03-06: 25 mg via ORAL
  Filled 2022-03-06: qty 1

## 2022-03-06 MED ORDER — ACETAMINOPHEN 325 MG PO TABS
650.0000 mg | ORAL_TABLET | Freq: Once | ORAL | Status: AC
  Administered 2022-03-06: 650 mg via ORAL
  Filled 2022-03-06: qty 2

## 2022-03-06 MED ORDER — INFLUENZA VAC SPLIT QUAD 0.5 ML IM SUSY
0.5000 mL | PREFILLED_SYRINGE | Freq: Once | INTRAMUSCULAR | Status: AC
  Administered 2022-03-06: 0.5 mL via INTRAMUSCULAR
  Filled 2022-03-06: qty 0.5

## 2022-03-06 MED ORDER — SODIUM CHLORIDE 0.9 % IV SOLN: Freq: Once | INTRAVENOUS | Status: AC

## 2022-03-06 MED ORDER — SODIUM CHLORIDE 0.9% FLUSH
10.0000 mL | INTRAVENOUS | Status: DC | PRN
  Administered 2022-03-06: 10 mL

## 2022-03-06 MED ORDER — HEPARIN SOD (PORK) LOCK FLUSH 100 UNIT/ML IV SOLN
500.0000 [IU] | Freq: Once | INTRAVENOUS | Status: AC | PRN
  Administered 2022-03-06: 500 [IU]

## 2022-03-06 MED ORDER — TRASTUZUMAB-ANNS CHEMO 150 MG IV SOLR
6.0000 mg/kg | Freq: Once | INTRAVENOUS | Status: AC
  Administered 2022-03-06: 336 mg via INTRAVENOUS
  Filled 2022-03-06: qty 16

## 2022-03-06 NOTE — Progress Notes (Signed)
[facility] Follow up:    Sherri Norlander, DO [place] [address]   DIAGNOSIS:  Cancer Staging  Malignant neoplasm of upper-outer quadrant of right breast in female, estrogen receptor positive ([name]) Staging form: Breast, AJCC 8th Edition - Clinical: Stage IIA (cT2, cN0, cM0, G1, ER+, PR-, HER2+) - Unsigned Method of lymph node assessment: Clinical Histologic grading system: 3 grade system Laterality: Right Tumor size (mm): 21 - Pathologic stage from 05/11/2018: Stage IIIB (pT2, pN3, cM0, G3, ER+, PR-, HER2+) - Signed by Nicholas Lose, MD on 05/11/2018 Neoadjuvant therapy: No Multigene prognostic tests performed: None Histologic grading system: 3 grade system   SUMMARY OF ONCOLOGIC HISTORY: Oncology History  Malignant neoplasm of upper-outer quadrant of right breast in female, estrogen receptor positive ([name])  03/18/2018 Initial Diagnosis   Screening detected bilateral breast abnormalities.  Right breast mass UOQ 2.1 cm at 10 o'clock position: Grade 1 ILC ER 70%, PR 0%, HER-2 +3+ by IHC, Ki-67 5%, suspected lymph node in the axilla could not be biopsied; left breast calcifications by ultrasound not visible but 5 cm cystic/tubular structure which on biopsy was fibrocystic change with ADHl; T2N0 stage IIa clinical stage   05/01/2018 Surgery   Bilateral mastectomies: Left mastectomy: DCIS intermediate grade 0.9 cm margins negative Tis NX stage 0; right mastectomy: ILC, grade 3, 3.5 cm, with LCIS, perineural invasion present, margins negative, 15/22 lymph nodes positive with extracapsular extension, ER 90%, PR 0%, HER-2 +(IHC 3+), Ki-67 5%, T2N3 Stage 3B    05/11/2018 Cancer Staging   Staging form: Breast, AJCC 8th Edition - Pathologic stage from 05/11/2018: Stage IIIB (pT2, pN3, cM0, G3, ER+, PR-, HER2+) - Signed by Nicholas Lose, MD on 05/11/2018   05/2018 -  Anti-estrogen oral therapy   Tamoxifen daily   06/03/2018 Imaging   Bone scan:  Increased tracer uptake in the lumbar spine at L1, L3 and L4, right fourth and seventh ribs, inferior right scapula and distal sternum, T7 and T11 as well.   CT CAP showed widespread bone metastases with possible pathologic fracture at L3, peripheral right upper lobe nodules nonspecific   06/10/2018 -  Chemotherapy   Taxol Kingenti and Perjeta    10/27/2018 PET scan   Bone metastases less metabolically active.  Couple of lymph nodes with low-level metabolic activity noted.     CURRENT THERAPY: Herceptin every 6 weeks/tamoxifen daily  INTERVAL HISTORY: [name] 56 y.o. female returns for follow-up of her metastatic breast cancer on treatment with Herceptin and tamoxifen.  Her most recent echocardiogram was completed on November 19, 2021 and showed a left ventricular ejection fraction of 60 to 65%.  She typically receives echocardiograms every 6 months.  Her most recent restaging occurred May 17, 2021 showing no new or progressive findings in the chest abdomen and pelvis no change in bony metastatic disease, mild hepatic steatosis, uterine leiomyomata, and L3 compression fracture with chronic appearance.  She is undergoing restaging annually.     Patient Active Problem List   Diagnosis Date Noted   Goals of care, counseling/discussion 09/14/2018   Port-A-Cath in place 06/17/2018   Malignant neoplasm metastatic to bone ([place]) 06/04/2018   Breast cancer, stage 2, right ([place]) 05/01/2018   Malignant neoplasm of upper-outer quadrant of right breast in female, estrogen receptor positive ([place]) 03/23/2018   Pre-diabetes 12/24/2016   Healthcare maintenance 12/26/2014   Headache 12/26/2014   Frequency of micturition 09/03/2013   Need for Tdap vaccination 09/03/2013  Pain in limb 04/12/2013   Wart viral 04/12/2013   HLD (hyperlipidemia) 09/16/2012   Vitamin D deficiency 09/16/2012   Hyperglycemia 09/16/2012   HTN (hypertension) 09/16/2012   ADENOMATOUS COLONIC POLYP 11/21/2009   DENTAL  PAIN 07/11/2009   Infantile cerebral palsy ([name]) 11/23/2008   GERD 11/23/2008   Constipation 11/23/2008   Dysphagia 11/23/2008    is allergic to hydrocodone.  MEDICAL HISTORY: Past Medical History:  Diagnosis Date   Arthritis    Cerebral palsy ([name])    Complication of anesthesia 12/09/2013   Respiratory instability with 10 mg Propofol.Poor gag reflex.   Constipation - functional    Dysphagia 2003   2O to GERD/HH, ?difficulty with large food bolus due to neuromuscular weakness   Epigastric pain MAY 2012 ABD [place] NL GBLIVPAN   2o to GERD V. NON-ULCER DYSPEPSIA   GERD (gastroesophageal reflux disease) 2007   BPE NL ESO, REFLUX   Hiatal hernia    HIATAL HERNIA 11/23/2008   Qualifier: Diagnosis of  By: Zeb Comfort     HTN (hypertension)    Hyperlipidemia     SURGICAL HISTORY: Past Surgical History:  Procedure Laterality Date   COLONOSCOPY  APR 2009 with proprofol   SLF: SIMPLE ADENOMAS   MASTECTOMY W/ SENTINEL NODE BIOPSY Bilateral 05/01/2018   Procedure: BILATERAL MASTECTOMIES WITH LEFT SENTINEL LYMPH NODE BIOPSIES AND RIGHT AXILLARY LYMPH NODE DISSECTION;  Surgeon: Alphonsa Overall, MD;  Location: [facility];  Service: General;  Laterality: Bilateral;   PORTACATH PLACEMENT N/A 05/01/2018   Procedure: INSERTION PORT-A-CATH WITH [place];  Surgeon: Alphonsa Overall, MD;  Location: [facility];  Service: General;  Laterality: N/A;   UPPER GASTROINTESTINAL ENDOSCOPY  MAY 2012 DIL 16 mm   SLF: NO DEFINITE STRICTURE APPRECIATED   UPPER GASTROINTESTINAL ENDOSCOPY  FEB 2009-HYPOXIA REQUIRING NARCAN, & VERSED, D50V4   SLF: NL ESO, FUNDIC GLAND POLYPS   UPPER GASTROINTESTINAL ENDOSCOPY  w/ DIL 2003, 2004, 2006    SOCIAL HISTORY: Social History   Socioeconomic History   Marital status: Single    Spouse name: Not on file   Number of children: Not on file   Years of education: Not on file   Highest education level: Not on file  Occupational History   Not on file  Tobacco Use   Smoking status:  Never   Smokeless tobacco: Never  Substance and Sexual Activity   Alcohol use: No   Drug use: Yes    Types: PCP   Sexual activity: Not Currently    Birth control/protection: None  Other Topics Concern   Not on file  Social History Narrative   Not on file   Social Determinants of Health   Financial Resource Strain: Not on file  Food Insecurity: Not on file  Transportation Needs: Not on file  Physical Activity: Not on file  Stress: Not on file  Social Connections: Not on file  Intimate Partner Violence: Not on file    FAMILY HISTORY: Family History  Problem Relation Age of Onset   Colon cancer Mother        LATE 50S    Review of Systems  Constitutional:  Negative for appetite change, chills, fatigue, fever and unexpected weight change.  HENT:   Negative for hearing loss, lump/mass and trouble swallowing.   Eyes:  Negative for eye problems and icterus.  Respiratory:  Negative for chest tightness, cough and shortness of breath.   Cardiovascular:  Negative for chest pain, leg swelling and palpitations.  Gastrointestinal:  Negative for abdominal  distention, abdominal pain, constipation, diarrhea, nausea and vomiting.  Endocrine: Negative for hot flashes.  Genitourinary:  Negative for difficulty urinating.   Musculoskeletal:  Negative for arthralgias.  Skin:  Negative for itching and rash.  Neurological:  Negative for dizziness, extremity weakness, headaches and numbness.  Hematological:  Negative for adenopathy. Does not bruise/bleed easily.  Psychiatric/Behavioral:  Negative for depression. The patient is not nervous/anxious.       PHYSICAL EXAMINATION  ECOG PERFORMANCE STATUS: 1 - Symptomatic but completely ambulatory  Vitals:   03/06/22 1022  BP: 133/74  Pulse: 86  Resp: 19  Temp: (!) 97.5 F (36.4 C)  SpO2: 97%    Physical Exam Constitutional:      General: She is not in acute distress.    Appearance: Normal appearance. She is not toxic-appearing.   HENT:     Head: Normocephalic and atraumatic.  Eyes:     General: No scleral icterus. Cardiovascular:     Rate and Rhythm: Normal rate and regular rhythm.     Pulses: Normal pulses.     Heart sounds: Normal heart sounds.  Pulmonary:     Effort: Pulmonary effort is normal.     Breath sounds: Normal breath sounds.  Abdominal:     General: Abdomen is flat. Bowel sounds are normal. There is no distension.     Palpations: Abdomen is soft.     Tenderness: There is no abdominal tenderness.  Musculoskeletal:        General: No swelling.     Cervical back: Neck supple.  Lymphadenopathy:     Cervical: No cervical adenopathy.  Skin:    General: Skin is warm and dry.     Findings: No rash.  Neurological:     General: No focal deficit present.     Mental Status: She is alert.  Psychiatric:        Mood and Affect: Mood normal.        Behavior: Behavior normal.     LABORATORY DATA:  CBC    Component Value Date/Time   WBC 8.6 03/06/2022 1007   WBC 7.8 04/21/2018 1333   RBC 3.85 (L) 03/06/2022 1007   HGB 12.0 03/06/2022 1007   HGB 12.8 06/09/2017 1137   HCT 35.0 (L) 03/06/2022 1007   HCT 39.2 06/09/2017 1137   PLT 281 03/06/2022 1007   PLT 312 06/09/2017 1137   MCV 90.9 03/06/2022 1007   MCV 92 06/09/2017 1137   MCH 31.2 03/06/2022 1007   MCHC 34.3 03/06/2022 1007   RDW 13.4 03/06/2022 1007   RDW 14.2 06/09/2017 1137   LYMPHSABS 2.1 03/06/2022 1007   LYMPHSABS 2.3 06/09/2017 1137   MONOABS 0.5 03/06/2022 1007   EOSABS 0.1 03/06/2022 1007   EOSABS 0.1 06/09/2017 1137   BASOSABS 0.1 03/06/2022 1007   BASOSABS 0.0 06/09/2017 1137    CMP     Component Value Date/Time   NA 141 03/06/2022 1007   NA 145 (H) 10/07/2017 0831   K 3.9 03/06/2022 1007   CL 109 03/06/2022 1007   CO2 26 03/06/2022 1007   GLUCOSE 146 (H) 03/06/2022 1007   BUN 21 (H) 03/06/2022 1007   BUN 12 10/07/2017 0831   CREATININE 0.64 03/06/2022 1007   CREATININE 0.54 09/16/2012 1230   CALCIUM 9.0  03/06/2022 1007   PROT 7.6 03/06/2022 1007   PROT 7.2 06/09/2017 1137   ALBUMIN 3.9 03/06/2022 1007   ALBUMIN 4.2 06/09/2017 1137   AST 12 (L) 03/06/2022 1007   ALT  11 03/06/2022 1007   ALKPHOS 51 03/06/2022 1007   BILITOT 0.3 03/06/2022 1007   GFRNONAA >60 03/06/2022 1007   GFRNONAA >89 09/16/2012 1230   GFRAA >60 02/10/2020 1050   GFRAA >89 09/16/2012 1230        ASSESSMENT and THERAPY PLAN:   Malignant neoplasm of upper-outer quadrant of right breast in female, estrogen receptor positive ([name]) Ron is a 56 year old woman with metastatic breast cancer here today for follow-up.  She will continue on Herceptin every 6 weeks and tamoxifen daily she has no clinical signs of breast cancer progression.  We reviewed the need to repeat restaging with CT chest abdomen and pelvis to ensure her treatment is working for her stage IV breast cancer and this will be completed in January 2024.  She is also going to be due for an echo in January 2024 which I placed orders for today.  She will return in 6 weeks for labs, follow-up, and her next treatment which will be April 17, 2022.  I asked our schedulers to schedule this.   All questions were answered. The patient knows to call the clinic with any problems, questions or concerns. We can certainly see the patient much sooner if necessary.  Total encounter time:20 minutes*in face-to-face visit time, chart review, lab review, care coordination, order entry, and documentation of the encounter time.    Wilber Bihari, NP 03/12/22 4:27 PM Medical Oncology and Hematology [facility] [place], [place] [address] Tel. [phone]    Fax. [phone]  *Total Encounter Time as defined by the Centers for Medicare and Medicaid Services includes, in addition to the face-to-face time of a patient visit (documented in the note above) non-face-to-face time: obtaining and reviewing outside history, ordering and reviewing  medications, tests or procedures, care coordination (communications with other health care professionals or caregivers) and documentation in the medical record.

## 2022-03-06 NOTE — Patient Instructions (Signed)
[facility] MEDICAL ONCOLOGY  Discharge Instructions: Thank you for choosing [facility] to provide your oncology and hematology care.   If you have a lab appointment with the Cancer Center, please go directly to the Cancer Center and check in at the registration area.   Wear comfortable clothing and clothing appropriate for easy access to any Portacath or PICC line.   We strive to give you quality time with your provider. You may need to reschedule your appointment if you arrive late (15 or more minutes).  Arriving late affects you and other patients whose appointments are after yours.  Also, if you miss three or more appointments without notifying the office, you may be dismissed from the clinic at the provider's discretion.      For prescription refill requests, have your pharmacy contact our office and allow 72 hours for refills to be completed.    Today you received the following chemotherapy and/or immunotherapy agents: trastuzumab      To help prevent nausea and vomiting after your treatment, we encourage you to take your nausea medication as directed.  BELOW ARE SYMPTOMS THAT SHOULD BE REPORTED IMMEDIATELY: *FEVER GREATER THAN 100.4 F (38 C) OR HIGHER *CHILLS OR SWEATING *NAUSEA AND VOMITING THAT IS NOT CONTROLLED WITH YOUR NAUSEA MEDICATION *UNUSUAL SHORTNESS OF BREATH *UNUSUAL BRUISING OR BLEEDING *URINARY PROBLEMS (pain or burning when urinating, or frequent urination) *BOWEL PROBLEMS (unusual diarrhea, constipation, pain near the anus) TENDERNESS IN MOUTH AND THROAT WITH OR WITHOUT PRESENCE OF ULCERS (sore throat, sores in mouth, or a toothache) UNUSUAL RASH, SWELLING OR PAIN  UNUSUAL VAGINAL DISCHARGE OR ITCHING   Items with * indicate a potential emergency and should be followed up as soon as possible or go to the Emergency Department if any problems should occur.  Please show the CHEMOTHERAPY ALERT CARD or IMMUNOTHERAPY ALERT CARD at check-in  to the Emergency Department and triage nurse.  Should you have questions after your visit or need to cancel or reschedule your appointment, please contact [facility] MEDICAL ONCOLOG[facility]  Dept: [phone]  and follow the prompts.  Office hours are 8:00 a.m. to 4:30 p.m. Monday - Friday. Please note that voicemails left after 4:00 p.m. may not be returned until the following business day.  We are closed weekends and major holidays. You have access to a nurse at all times for urgent questions. Please call the main number to the clinic Dept: [phone] and follow the prompts.   For any non-urgent questions, you may also contact your provider using MyChart. We now offer e-Visits for anyone 18 and older to request care online for non-urgent symptoms. For details visit [email or web address].   Also download the MyChart app! Go to the app store, search "MyChart", open the app, select , and log in with your MyChart username and password.  Masks are optional in the cancer centers. If you would like for your care team to wear a mask while they are taking care of you, please let them know. You may have one support person who is at least 58 years old accompany you for your appointments. 

## 2022-03-07 ENCOUNTER — Other Ambulatory Visit: Payer: Self-pay

## 2022-03-10 ENCOUNTER — Other Ambulatory Visit: Payer: Self-pay | Admitting: Hematology and Oncology

## 2022-03-12 ENCOUNTER — Encounter: Payer: Self-pay | Admitting: Hematology and Oncology

## 2022-03-12 NOTE — Assessment & Plan Note (Signed)
[name] is a 56 year old woman with metastatic breast cancer here today for follow-up.  She will continue on Herceptin every 6 weeks and tamoxifen daily she has no clinical signs of breast cancer progression.  We reviewed the need to repeat restaging with CT chest abdomen and pelvis to ensure her treatment is working for her stage IV breast cancer and this will be completed in January 2024.  She is also going to be due for an echo in January 2024 which I placed orders for today.  She will return in 6 weeks for labs, follow-up, and her next treatment which will be April 17, 2022.  I asked our schedulers to schedule this.

## 2022-03-14 ENCOUNTER — Other Ambulatory Visit: Payer: Self-pay

## 2022-03-17 ENCOUNTER — Other Ambulatory Visit: Payer: Self-pay

## 2022-03-19 ENCOUNTER — Other Ambulatory Visit: Payer: Self-pay

## 2022-03-27 ENCOUNTER — Other Ambulatory Visit: Payer: Self-pay | Admitting: Hematology and Oncology

## 2022-03-27 MED ORDER — MEGESTROL ACETATE 40 MG PO TABS: 40.0000 mg | ORAL_TABLET | Freq: Two times a day (BID) | ORAL | 7 refills | Status: DC

## 2022-04-13 NOTE — Progress Notes (Incomplete)
Patient Care Team: Janora Norlander, DO as PCP - General (Family Medicine) Danie Binder, MD (Inactive) (Gastroenterology) Alphonsa Overall, MD as Consulting Physician (General Surgery) Nicholas Lose, MD as Consulting Physician (Hematology and Oncology) Kyung Rudd, MD as Consulting Physician (Radiation Oncology) Mauro Kaufmann, RN as Oncology Nurse Navigator Rockwell Germany, RN as Oncology Nurse Navigator  DIAGNOSIS: No diagnosis found.  SUMMARY OF ONCOLOGIC HISTORY: Oncology History  Malignant neoplasm of upper-outer quadrant of right breast in female, estrogen receptor positive ([place])  03/18/2018 Initial Diagnosis   Screening detected bilateral breast abnormalities.  Right breast mass UOQ 2.1 cm at 10 o'clock position: Grade 1 ILC ER 70%, PR 0%, HER-2 +3+ by IHC, Ki-67 5%, suspected lymph node in the axilla could not be biopsied; left breast calcifications by ultrasound not visible but 5 cm cystic/tubular structure which on biopsy was fibrocystic change with ADHl; T2N0 stage IIa clinical stage   05/01/2018 Surgery   Bilateral mastectomies: Left mastectomy: DCIS intermediate grade 0.9 cm margins negative Tis NX stage 0; right mastectomy: ILC, grade 3, 3.5 cm, with LCIS, perineural invasion present, margins negative, 15/22 lymph nodes positive with extracapsular extension, ER 90%, PR 0%, HER-2 +(IHC 3+), Ki-67 5%, T2N3 Stage 3B    05/11/2018 Cancer Staging   Staging form: Breast, AJCC 8th Edition - Pathologic stage from 05/11/2018: Stage IIIB (pT2, pN3, cM0, G3, ER+, PR-, HER2+) - Signed by Nicholas Lose, MD on 05/11/2018   05/2018 -  Anti-estrogen oral therapy   Tamoxifen daily   06/03/2018 Imaging   Bone scan: Increased tracer uptake in the lumbar spine at L1, L3 and L4, right fourth and seventh ribs, inferior right scapula and distal sternum, T7 and T11 as well.   CT CAP showed widespread bone metastases with possible pathologic fracture at L3, peripheral right upper lobe  nodules nonspecific   06/10/2018 -  Chemotherapy   Taxol Kingenti and Perjeta    10/27/2018 PET scan   Bone metastases less metabolically active.  Couple of lymph nodes with low-level metabolic activity noted.     CHIEF COMPLIANT: Follow-up of metastatic breast cancer on herceptin   INTERVAL HISTORY: [name] is a 56 y.o. with above-mentioned history of metastatic breast cancer currently on treatment with maintenance Kingenti and on anti-estrogen therapy with anastrozole. She presents to the clinic today for a follow-up.     ALLERGIES:  is allergic to hydrocodone.  MEDICATIONS:  Current Outpatient Medications  Medication Sig Dispense Refill   diphenoxylate-atropine (LOMOTIL) 2.5-0.025 MG tablet TAKE 1 TO 2 TABLETS BY MOUTH 4 TIMES A DAY AS NEEDED FOR DIARRHEA 40 tablet 1   ibuprofen (ADVIL,MOTRIN) 600 MG tablet Take 1 tablet (600 mg total) by mouth every 6 (six) hours as needed (for mild pain not relieved by other medications.). 30 tablet 0   lisinopril (ZESTRIL) 40 MG tablet TAKE 1 TABLET BY MOUTH EVERY DAY 90 tablet 3   megestrol (MEGACE) 40 MG tablet Take 1 tablet (40 mg total) by mouth 2 (two) times daily. Take 3 tablets every day 90 tablet 7   pantoprazole (PROTONIX) 40 MG tablet TAKE 1 TABLET BY MOUTH 30 MINUTES PRIOR TO BREAKFAST AND SUPPER, 180 tablet 3   potassium chloride (KLOR-CON) 10 MEQ tablet TAKE 1 TABLET BY MOUTH EVERY DAY 90 tablet 1   pravastatin (PRAVACHOL) 40 MG tablet TAKE 1 TABLET BY MOUTH EVERY DAY 90 tablet 3   tamoxifen (NOLVADEX) 20 MG tablet TAKE 1 TABLET BY MOUTH EVERY DAY 90 tablet 3  No current facility-administered medications for this visit.    PHYSICAL EXAMINATION: ECOG PERFORMANCE STATUS: {CHL ONC ECOG PS:[phone]}  There were no vitals filed for this visit. There were no vitals filed for this visit.  BREAST:*** No palpable masses or nodules in either right or left breasts. No palpable axillary supraclavicular or infraclavicular adenopathy  no breast tenderness or nipple discharge. (exam performed in the presence of a chaperone)  LABORATORY DATA:  I have reviewed the data as listed    Latest Ref Rng & Units 03/06/2022   10:07 AM 12/12/2021    9:25 AM 09/19/2021    9:17 AM  CMP  Glucose 70 - 99 mg/dL 146  154  173   BUN 6 - 20 mg/dL _0 Creatinine 0.44 - 1.00 mg/dL 0.64  0.62  0.59   Sodium 135 - 145 mmol/L 141  137  141   Potassium 3.5 - 5.1 mmol/L 3.9  3.8  3.1   Chloride 98 - 111 mmol/L 109  106  109   CO2 22 - 32 mmol/L _1 Calcium 8.9 - 10.3 mg/dL 9.0  8.7  8.6   Total Protein 6.5 - 8.1 g/dL 7.6  7.7  7.6   Total Bilirubin 0.3 - 1.2 mg/dL 0.3  0.3  0.3   Alkaline Phos 38 - 126 U/L 51  51  46   AST 15 - 41 U/L _2 ALT 0 - 44 U/L _3 Lab Results  Component Value Date   WBC 8.6 03/06/2022   HGB 12.0 03/06/2022   HCT 35.0 (L) 03/06/2022   MCV 90.9 03/06/2022   PLT 281 03/06/2022   NEUTROABS 5.9 03/06/2022    ASSESSMENT & PLAN:  No problem-specific Assessment & Plan notes found for this encounter.    No orders of the defined types were placed in this encounter.  The patient has a good understanding of the overall plan. she agrees with it. she will call with any problems that may develop before the next visit here. Total time spent: 30 mins including face to face time and time spent for planning, charting and co-ordination of care   Suzzette Righter, [place] 04/13/22    I Gardiner Coins am scribing for Dr. Lindi Adie  ***

## 2022-04-17 ENCOUNTER — Inpatient Hospital Stay: Payer: Medicare Other | Attending: Hematology and Oncology

## 2022-04-17 ENCOUNTER — Inpatient Hospital Stay: Payer: Medicare Other | Admitting: Hematology and Oncology

## 2022-04-17 ENCOUNTER — Inpatient Hospital Stay: Payer: Medicare Other

## 2022-04-17 VITALS — BP 136/74 | HR 85 | Temp 99.4°F | Resp 18

## 2022-04-17 DIAGNOSIS — C7951 Secondary malignant neoplasm of bone: Secondary | ICD-10-CM | POA: Diagnosis not present

## 2022-04-17 DIAGNOSIS — Z7189 Other specified counseling: Secondary | ICD-10-CM

## 2022-04-17 DIAGNOSIS — C50411 Malignant neoplasm of upper-outer quadrant of right female breast: Secondary | ICD-10-CM | POA: Diagnosis not present

## 2022-04-17 DIAGNOSIS — Z5112 Encounter for antineoplastic immunotherapy: Secondary | ICD-10-CM | POA: Diagnosis not present

## 2022-04-17 DIAGNOSIS — Z170 Estrogen receptor positive status [ER+]: Secondary | ICD-10-CM | POA: Insufficient documentation

## 2022-04-17 MED ORDER — SODIUM CHLORIDE 0.9% FLUSH
10.0000 mL | INTRAVENOUS | Status: DC | PRN
  Administered 2022-04-17: 10 mL

## 2022-04-17 MED ORDER — SODIUM CHLORIDE 0.9 % IV SOLN: Freq: Once | INTRAVENOUS | Status: AC

## 2022-04-17 MED ORDER — ACETAMINOPHEN 325 MG PO TABS
650.0000 mg | ORAL_TABLET | Freq: Once | ORAL | Status: AC
  Administered 2022-04-17: 650 mg via ORAL
  Filled 2022-04-17: qty 2

## 2022-04-17 MED ORDER — DIPHENHYDRAMINE HCL 25 MG PO CAPS
25.0000 mg | ORAL_CAPSULE | Freq: Once | ORAL | Status: AC
  Administered 2022-04-17: 25 mg via ORAL
  Filled 2022-04-17: qty 1

## 2022-04-17 MED ORDER — TRASTUZUMAB-ANNS CHEMO 150 MG IV SOLR
6.0000 mg/kg | Freq: Once | INTRAVENOUS | Status: AC
  Administered 2022-04-17: 336 mg via INTRAVENOUS
  Filled 2022-04-17: qty 16

## 2022-04-17 MED ORDER — HEPARIN SOD (PORK) LOCK FLUSH 100 UNIT/ML IV SOLN
500.0000 [IU] | Freq: Once | INTRAVENOUS | Status: AC | PRN
  Administered 2022-04-17: 500 [IU]

## 2022-04-17 NOTE — Patient Instructions (Signed)
[facility]ICAL ONCOLOG[facility]  Discharge Instructions: Thank you for choosing [facility] to provide your oncology and hematology care.   If you have a lab appointment with the Cancer Center, please go directly to the Cancer Center and check in at the registration area.   Wear comfortable clothing and clothing appropriate for easy access to any Portacath or PICC line.   We strive to give you quality time with your provider. You may need to reschedule your appointment if you arrive late (15 or more minutes).  Arriving late affects you and other patients whose appointments are after yours.  Also, if you miss three or more appointments without notifying the office, you may be dismissed from the clinic at the provider's discretion.      For prescription refill requests, have your pharmacy contact our office and allow 72 hours for refills to be completed.    Today you received the following chemotherapy and/or immunotherapy agents: Trastuzumab.       To help prevent nausea and vomiting after your treatment, we encourage you to take your nausea medication as directed.  BELOW ARE SYMPTOMS THAT SHOULD BE REPORTED IMMEDIATELY: *FEVER GREATER THAN 100.4 F (38 C) OR HIGHER *CHILLS OR SWEATING *NAUSEA AND VOMITING THAT IS NOT CONTROLLED WITH YOUR NAUSEA MEDICATION *UNUSUAL SHORTNESS OF BREATH *UNUSUAL BRUISING OR BLEEDING *URINARY PROBLEMS (pain or burning when urinating, or frequent urination) *BOWEL PROBLEMS (unusual diarrhea, constipation, pain near the anus) TENDERNESS IN MOUTH AND THROAT WITH OR WITHOUT PRESENCE OF ULCERS (sore throat, sores in mouth, or a toothache) UNUSUAL RASH, SWELLING OR PAIN  UNUSUAL VAGINAL DISCHARGE OR ITCHING   Items with * indicate a potential emergency and should be followed up as soon as possible or go to the Emergency Department if any problems should occur.  Please show the CHEMOTHERAPY ALERT CARD or IMMUNOTHERAPY ALERT CARD at check-in  to the Emergency Department and triage nurse.  Should you have questions after your visit or need to cancel or reschedule your appointment, please contact [facility]COL[facility]  Dept: [phone]  and follow the prompts.  Office hours are 8:00 a.m. to 4:30 p.m. Monday - Friday. Please note that voicemails left after 4:00 p.m. may not be returned until the following business day.  We are closed weekends and major holidays. You have access to a nurse at all times for urgent questions. Please call the main number to the clinic Dept: [phone] and follow the prompts.   For any non-urgent questions, you may also contact your provider using MyChart. We now offer e-Visits for anyone 18 and older to request care online for non-urgent symptoms. For details visit [email or web address].   Also download the MyChart app! Go to the app store, search "MyChart", open the app, select [facility], and log in with your MyChart username and password.  Masks are optional in the cancer centers. If you would like for your care team to wear a mask while they are taking care of you, please let them know. You may have one support person who is at least 58 years old accompany you for your appointments. 

## 2022-05-10 ENCOUNTER — Telehealth: Payer: Self-pay | Admitting: Hematology and Oncology

## 2022-05-10 NOTE — Telephone Encounter (Signed)
Scheduled appointment per provider [facility]. Left voicemail. 

## 2022-05-16 ENCOUNTER — Other Ambulatory Visit: Payer: Self-pay

## 2022-05-17 ENCOUNTER — Other Ambulatory Visit: Payer: Self-pay

## 2022-05-20 ENCOUNTER — Ambulatory Visit (HOSPITAL_COMMUNITY)
Admission: RE | Admit: 2022-05-20 | Discharge: 2022-05-20 | Disposition: A | Discharge status: Home / Self Care | Payer: 59 | Source: Ambulatory Visit | Attending: Adult Health | Admitting: Adult Health

## 2022-05-20 DIAGNOSIS — Z08 Encounter for follow-up examination after completed treatment for malignant neoplasm: Secondary | ICD-10-CM | POA: Diagnosis not present

## 2022-05-20 DIAGNOSIS — G809 Cerebral palsy, unspecified: Secondary | ICD-10-CM | POA: Diagnosis not present

## 2022-05-20 DIAGNOSIS — Z853 Personal history of malignant neoplasm of breast: Secondary | ICD-10-CM | POA: Insufficient documentation

## 2022-05-20 DIAGNOSIS — Z170 Estrogen receptor positive status [ER+]: Secondary | ICD-10-CM | POA: Diagnosis not present

## 2022-05-20 DIAGNOSIS — Z0189 Encounter for other specified special examinations: Secondary | ICD-10-CM

## 2022-05-20 DIAGNOSIS — E785 Hyperlipidemia, unspecified: Secondary | ICD-10-CM | POA: Insufficient documentation

## 2022-05-20 DIAGNOSIS — I10 Essential (primary) hypertension: Secondary | ICD-10-CM | POA: Insufficient documentation

## 2022-05-20 DIAGNOSIS — C50411 Malignant neoplasm of upper-outer quadrant of right female breast: Secondary | ICD-10-CM

## 2022-05-20 DIAGNOSIS — K219 Gastro-esophageal reflux disease without esophagitis: Secondary | ICD-10-CM | POA: Insufficient documentation

## 2022-05-20 DIAGNOSIS — K449 Diaphragmatic hernia without obstruction or gangrene: Secondary | ICD-10-CM | POA: Diagnosis not present

## 2022-05-20 LAB — ECHOCARDIOGRAM COMPLETE
Area-P 1/2: 3.7 cm2
S' Lateral: 2.7 cm

## 2022-05-20 NOTE — Progress Notes (Signed)
  Echocardiogram 2D Echocardiogram has been performed.  [name] M 05/20/2022, 9:53 AM

## 2022-05-22 ENCOUNTER — Encounter: Payer: Self-pay | Admitting: Hematology and Oncology

## 2022-05-27 ENCOUNTER — Encounter (HOSPITAL_COMMUNITY): Payer: Self-pay

## 2022-05-27 ENCOUNTER — Ambulatory Visit (HOSPITAL_COMMUNITY)
Admission: RE | Admit: 2022-05-27 | Discharge: 2022-05-27 | Disposition: A | Discharge status: Home / Self Care | Payer: 59 | Source: Ambulatory Visit | Attending: Adult Health | Admitting: Adult Health

## 2022-05-27 DIAGNOSIS — K429 Umbilical hernia without obstruction or gangrene: Secondary | ICD-10-CM | POA: Diagnosis not present

## 2022-05-27 DIAGNOSIS — Z170 Estrogen receptor positive status [ER+]: Secondary | ICD-10-CM | POA: Diagnosis not present

## 2022-05-27 DIAGNOSIS — R7303 Prediabetes: Secondary | ICD-10-CM | POA: Diagnosis not present

## 2022-05-27 DIAGNOSIS — N200 Calculus of kidney: Secondary | ICD-10-CM | POA: Diagnosis not present

## 2022-05-27 DIAGNOSIS — C50411 Malignant neoplasm of upper-outer quadrant of right female breast: Secondary | ICD-10-CM | POA: Insufficient documentation

## 2022-05-27 DIAGNOSIS — I700 Atherosclerosis of aorta: Secondary | ICD-10-CM | POA: Diagnosis not present

## 2022-05-27 DIAGNOSIS — I771 Stricture of artery: Secondary | ICD-10-CM | POA: Diagnosis not present

## 2022-05-27 DIAGNOSIS — N281 Cyst of kidney, acquired: Secondary | ICD-10-CM | POA: Diagnosis not present

## 2022-05-27 DIAGNOSIS — C50911 Malignant neoplasm of unspecified site of right female breast: Secondary | ICD-10-CM | POA: Diagnosis not present

## 2022-05-27 LAB — POCT I-STAT CREATININE: Creatinine, Ser: 0.5 mg/dL (ref 0.44–1.00)

## 2022-05-27 MED ORDER — SODIUM CHLORIDE (PF) 0.9 % IJ SOLN
INTRAMUSCULAR | Status: AC
  Filled 2022-05-27: qty 50

## 2022-05-27 MED ORDER — HEPARIN SOD (PORK) LOCK FLUSH 100 UNIT/ML IV SOLN
500.0000 [IU] | Freq: Once | INTRAVENOUS | Status: AC
  Administered 2022-05-27: 500 [IU] via INTRAVENOUS

## 2022-05-27 MED ORDER — IOHEXOL 300 MG/ML  SOLN
100.0000 mL | Freq: Once | INTRAMUSCULAR | Status: AC | PRN
  Administered 2022-05-27: 100 mL via INTRAVENOUS

## 2022-05-27 MED ORDER — HEPARIN SOD (PORK) LOCK FLUSH 100 UNIT/ML IV SOLN
INTRAVENOUS | Status: AC
  Filled 2022-05-27: qty 5

## 2022-05-29 ENCOUNTER — Inpatient Hospital Stay: Payer: 59

## 2022-05-29 ENCOUNTER — Inpatient Hospital Stay (HOSPITAL_BASED_OUTPATIENT_CLINIC_OR_DEPARTMENT_OTHER): Payer: 59 | Admitting: Adult Health

## 2022-05-29 ENCOUNTER — Other Ambulatory Visit: Payer: Self-pay | Admitting: Pharmacist

## 2022-05-29 ENCOUNTER — Encounter: Payer: Self-pay | Admitting: Hematology and Oncology

## 2022-05-29 ENCOUNTER — Encounter: Payer: Self-pay | Admitting: Adult Health

## 2022-05-29 ENCOUNTER — Inpatient Hospital Stay: Payer: 59 | Admitting: Hematology and Oncology

## 2022-05-29 ENCOUNTER — Other Ambulatory Visit: Payer: Self-pay | Admitting: *Deleted

## 2022-05-29 ENCOUNTER — Inpatient Hospital Stay: Payer: 59 | Attending: Hematology and Oncology

## 2022-05-29 VITALS — BP 128/87 | HR 92 | Resp 18

## 2022-05-29 VITALS — BP 150/84 | HR 86 | Temp 97.7°F | Resp 15

## 2022-05-29 DIAGNOSIS — C7951 Secondary malignant neoplasm of bone: Secondary | ICD-10-CM | POA: Diagnosis not present

## 2022-05-29 DIAGNOSIS — Z170 Estrogen receptor positive status [ER+]: Secondary | ICD-10-CM | POA: Diagnosis not present

## 2022-05-29 DIAGNOSIS — Z800 Family history of malignant neoplasm of digestive organs: Secondary | ICD-10-CM | POA: Diagnosis not present

## 2022-05-29 DIAGNOSIS — C50411 Malignant neoplasm of upper-outer quadrant of right female breast: Secondary | ICD-10-CM

## 2022-05-29 DIAGNOSIS — I10 Essential (primary) hypertension: Secondary | ICD-10-CM | POA: Diagnosis not present

## 2022-05-29 DIAGNOSIS — R7303 Prediabetes: Secondary | ICD-10-CM | POA: Insufficient documentation

## 2022-05-29 DIAGNOSIS — R739 Hyperglycemia, unspecified: Secondary | ICD-10-CM

## 2022-05-29 DIAGNOSIS — E785 Hyperlipidemia, unspecified: Secondary | ICD-10-CM

## 2022-05-29 DIAGNOSIS — Z5112 Encounter for antineoplastic immunotherapy: Secondary | ICD-10-CM | POA: Diagnosis not present

## 2022-05-29 DIAGNOSIS — Z95828 Presence of other vascular implants and grafts: Secondary | ICD-10-CM

## 2022-05-29 DIAGNOSIS — Z7189 Other specified counseling: Secondary | ICD-10-CM

## 2022-05-29 LAB — CBC WITH DIFFERENTIAL (CANCER CENTER ONLY)
Abs Immature Granulocytes: 0.01 10*3/uL (ref 0.00–0.07)
Basophils Absolute: 0 10*3/uL (ref 0.0–0.1)
Basophils Relative: 0 %
Eosinophils Absolute: 0.1 10*3/uL (ref 0.0–0.5)
Eosinophils Relative: 2 %
HCT: 34.3 % — ABNORMAL LOW (ref 36.0–46.0)
Hemoglobin: 11.5 g/dL — ABNORMAL LOW (ref 12.0–15.0)
Immature Granulocytes: 0 %
Lymphocytes Relative: 26 %
Lymphs Abs: 2.1 10*3/uL (ref 0.7–4.0)
MCH: 30.5 pg (ref 26.0–34.0)
MCHC: 33.5 g/dL (ref 30.0–36.0)
MCV: 91 fL (ref 80.0–100.0)
Monocytes Absolute: 0.5 10*3/uL (ref 0.1–1.0)
Monocytes Relative: 7 %
Neutro Abs: 5.3 10*3/uL (ref 1.7–7.7)
Neutrophils Relative %: 65 %
Platelet Count: 319 10*3/uL (ref 150–400)
RBC: 3.77 MIL/uL — ABNORMAL LOW (ref 3.87–5.11)
RDW: 13.4 % (ref 11.5–15.5)
WBC Count: 8.1 10*3/uL (ref 4.0–10.5)
nRBC: 0 % (ref 0.0–0.2)

## 2022-05-29 LAB — CMP (CANCER CENTER ONLY)
ALT: 15 U/L (ref 0–44)
AST: 12 U/L — ABNORMAL LOW (ref 15–41)
Albumin: 3.7 g/dL (ref 3.5–5.0)
Alkaline Phosphatase: 48 U/L (ref 38–126)
Anion gap: 5 (ref 5–15)
BUN: 17 mg/dL (ref 6–20)
CO2: 26 mmol/L (ref 22–32)
Calcium: 8.9 mg/dL (ref 8.9–10.3)
Chloride: 108 mmol/L (ref 98–111)
Creatinine: 0.6 mg/dL (ref 0.44–1.00)
GFR, Estimated: >60 mL/min (ref >60)
Glucose, Bld: 161 mg/dL — ABNORMAL HIGH (ref 70–99)
Potassium: 3.7 mmol/L (ref 3.5–5.1)
Sodium: 139 mmol/L (ref 135–145)
Total Bilirubin: 0.3 mg/dL (ref 0.3–1.2)
Total Protein: 7.2 g/dL (ref 6.5–8.1)

## 2022-05-29 LAB — LIPID PANEL
Cholesterol: 157 mg/dL (ref 0–200)
HDL: 44 mg/dL (ref >40)
LDL Cholesterol: 99 mg/dL (ref 0–99)
Total CHOL/HDL Ratio: 3.6 RATIO
Triglycerides: 70 mg/dL (ref <150)
VLDL: 14 mg/dL (ref 0–40)

## 2022-05-29 LAB — HEMOGLOBIN A1C
Hgb A1c MFr Bld: 7.6 % — ABNORMAL HIGH (ref 4.8–5.6)
Mean Plasma Glucose: 171.42 mg/dL

## 2022-05-29 MED ORDER — TRASTUZUMAB-ANNS CHEMO 150 MG IV SOLR
6.0000 mg/kg | Freq: Once | INTRAVENOUS | Status: AC
  Administered 2022-05-29: 336 mg via INTRAVENOUS
  Filled 2022-05-29: qty 16

## 2022-05-29 MED ORDER — SODIUM CHLORIDE 0.9 % IV SOLN: Freq: Once | INTRAVENOUS | Status: AC

## 2022-05-29 MED ORDER — HEPARIN SOD (PORK) LOCK FLUSH 100 UNIT/ML IV SOLN
500.0000 [IU] | Freq: Once | INTRAVENOUS | Status: AC | PRN
  Administered 2022-05-29: 500 [IU]

## 2022-05-29 MED ORDER — SODIUM CHLORIDE 0.9% FLUSH
10.0000 mL | INTRAVENOUS | Status: DC | PRN
  Administered 2022-05-29: 10 mL

## 2022-05-29 MED ORDER — ACETAMINOPHEN 325 MG PO TABS
650.0000 mg | ORAL_TABLET | Freq: Once | ORAL | Status: AC
  Administered 2022-05-29: 650 mg via ORAL
  Filled 2022-05-29: qty 2

## 2022-05-29 MED ORDER — DIPHENHYDRAMINE HCL 25 MG PO CAPS
25.0000 mg | ORAL_CAPSULE | Freq: Once | ORAL | Status: AC
  Administered 2022-05-29: 25 mg via ORAL
  Filled 2022-05-29: qty 1

## 2022-05-29 NOTE — Patient Instructions (Signed)
[facility]  Discharge Instructions: Thank you for choosing [facility] to provide your oncology and hematology care.   If you have a lab appointment with the [facility], please go directly to the [facility] and check in at the registration area.   Wear comfortable clothing and clothing appropriate for easy access to any Portacath or PICC line.   We strive to give you quality time with your provider. You may need to reschedule your appointment if you arrive late (15 or more minutes).  Arriving late affects you and other patients whose appointments are after yours.  Also, if you miss three or more appointments without notifying the office, you may be dismissed from the clinic at the provider's discretion.      For prescription refill requests, have your pharmacy contact our office and allow 72 hours for refills to be completed.    Today you received the following chemotherapy and/or immunotherapy agents: Kanjinti      To help prevent nausea and vomiting after your treatment, we encourage you to take your nausea medication as directed.  BELOW ARE SYMPTOMS THAT SHOULD BE REPORTED IMMEDIATELY: *FEVER GREATER THAN 100.4 F (38 C) OR HIGHER *CHILLS OR SWEATING *NAUSEA AND VOMITING THAT IS NOT CONTROLLED WITH YOUR NAUSEA MEDICATION *UNUSUAL SHORTNESS OF BREATH *UNUSUAL BRUISING OR BLEEDING *URINARY PROBLEMS (pain or burning when urinating, or frequent urination) *BOWEL PROBLEMS (unusual diarrhea, constipation, pain near the anus) TENDERNESS IN MOUTH AND THROAT WITH OR WITHOUT PRESENCE OF ULCERS (sore throat, sores in mouth, or a toothache) UNUSUAL RASH, SWELLING OR PAIN  UNUSUAL VAGINAL DISCHARGE OR ITCHING   Items with * indicate a potential emergency and should be followed up as soon as possible or go to the Emergency Department if any problems should occur.  Please show the CHEMOTHERAPY ALERT CARD or IMMUNOTHERAPY ALERT CARD at check-in to  the Emergency Department and triage nurse.  Should you have questions after your visit or need to cancel or reschedule your appointment, please contact [facility]  Dept: [phone]  and follow the prompts.  Office hours are 8:00 a.m. to 4:30 p.m. Monday - Friday. Please note that voicemails left after 4:00 p.m. may not be returned until the following business day.  We are closed weekends and major holidays. You have access to a nurse at all times for urgent questions. Please call the main number to the clinic Dept: [phone] and follow the prompts.   For any non-urgent questions, you may also contact your provider using MyChart. We now offer e-Visits for anyone 44 and older to request care online for non-urgent symptoms. For details visit [email or web address].   Also download the MyChart app! Go to the app store, search "MyChart", open the app, select [facility], and log in with your MyChart username and password.

## 2022-05-29 NOTE — Progress Notes (Signed)
[facility] Follow up:    Sherri Norlander, DO [place] [address]   DIAGNOSIS:  Cancer Staging  Malignant neoplasm of upper-outer quadrant of right breast in female, estrogen receptor positive ([place]) Staging form: Breast, AJCC 8th Edition - Clinical: Stage IIA (cT2, cN0, cM0, G1, ER+, PR-, HER2+) - Unsigned Method of lymph node assessment: Clinical Histologic grading system: 3 grade system Laterality: Right Tumor size (mm): 21 - Pathologic stage from 05/11/2018: Stage IIIB (pT2, pN3, cM0, G3, ER+, PR-, HER2+) - Signed by Nicholas Lose, MD on 05/11/2018 Neoadjuvant therapy: No Multigene prognostic tests performed: None Histologic grading system: 3 grade system   SUMMARY OF ONCOLOGIC HISTORY: Oncology History  Malignant neoplasm of upper-outer quadrant of right breast in female, estrogen receptor positive ([name])  03/18/2018 Initial Diagnosis   Screening detected bilateral breast abnormalities.  Right breast mass UOQ 2.1 cm at 10 o'clock position: Grade 1 ILC ER 70%, PR 0%, HER-2 +3+ by IHC, Ki-67 5%, suspected lymph node in the axilla could not be biopsied; left breast calcifications by ultrasound not visible but 5 cm cystic/tubular structure which on biopsy was fibrocystic change with ADHl; T2N0 stage IIa clinical stage   05/01/2018 Surgery   Bilateral mastectomies: Left mastectomy: DCIS intermediate grade 0.9 cm margins negative Tis NX stage 0; right mastectomy: ILC, grade 3, 3.5 cm, with LCIS, perineural invasion present, margins negative, 15/22 lymph nodes positive with extracapsular extension, ER 90%, PR 0%, HER-2 +(IHC 3+), Ki-67 5%, T2N3 Stage 3B    05/11/2018 Cancer Staging   Staging form: Breast, AJCC 8th Edition - Pathologic stage from 05/11/2018: Stage IIIB (pT2, pN3, cM0, G3, ER+, PR-, HER2+) - Signed by Nicholas Lose, MD on 05/11/2018   05/2018 -  Anti-estrogen oral therapy   Tamoxifen daily   06/03/2018 Imaging   Bone scan:  Increased tracer uptake in the lumbar spine at L1, L3 and L4, right fourth and seventh ribs, inferior right scapula and distal sternum, T7 and T11 as well.   CT CAP showed widespread bone metastases with possible pathologic fracture at L3, peripheral right upper lobe nodules nonspecific   06/10/2018 -  Chemotherapy   Taxol Kingenti and Perjeta    10/27/2018 PET scan   Bone metastases less metabolically active.  Couple of lymph nodes with low-level metabolic activity noted.     CURRENT THERAPY: Herceptin every 6 weeks; tamoxifen daily  INTERVAL HISTORY: [name] 57 y.o. female returns for follow-up prior to receiving Herceptin.  He has undergone repeat echocardiogram along with CT chest abdomen pelvis in the past few weeks and is anxious to receive her results today.  She tolerates her treatment well.  She denies any issues with Tamoxifen or Herceptin.    Her most recent echo occurred on May 20, 2022 and demonstrated a left ventricular ejection fraction of 60 to 65% and a normal global longitudinal strain. Her most recent restaging scans with CT chest abdomen pelvis on May 27, 2022 demonstrated no evidence of cancer progression.  She has occasional abdominal discomfort for which she takes ibuprofen.  This is not an everyday occurrence.  A-10-year risk 3.9% B-150/84 today, previously has been controlled C-last lipid panel in 2019, never smoker D-h/o prediabetes and elevated blood sugar, last A1c in 2019, reports intake of well balanced diet E-wheelchair bound secondary to cerebral palsy  Patient Active Problem List   Diagnosis Date Noted   Goals of care, counseling/discussion 09/14/2018   Port-A-Cath in place 06/17/2018  Malignant neoplasm metastatic to bone ([name]) 06/04/2018   Breast cancer, stage 2, right ([name]) 05/01/2018   Malignant neoplasm of upper-outer quadrant of right breast in female, estrogen receptor positive ([name]) 03/23/2018   Pre-diabetes 12/24/2016    Healthcare maintenance 12/26/2014   Headache 12/26/2014   Frequency of micturition 09/03/2013   Need for Tdap vaccination 09/03/2013   Pain in limb 04/12/2013   Wart viral 04/12/2013   HLD (hyperlipidemia) 09/16/2012   Vitamin D deficiency 09/16/2012   Hyperglycemia 09/16/2012   HTN (hypertension) 09/16/2012   ADENOMATOUS COLONIC POLYP 11/21/2009   DENTAL PAIN 07/11/2009   Infantile cerebral palsy ([place]) 11/23/2008   GERD 11/23/2008   Constipation 11/23/2008   Dysphagia 11/23/2008    is allergic to hydrocodone.  MEDICAL HISTORY: Past Medical History:  Diagnosis Date   Arthritis    Cerebral palsy ([place])    Complication of anesthesia 12/09/2013   Respiratory instability with 10 mg Propofol.Poor gag reflex.   Constipation - functional    Dysphagia 2003   2O to GERD/HH, ?difficulty with large food bolus due to neuromuscular weakness   Epigastric pain MAY 2012 ABD [place] NL GBLIVPAN   2o to GERD V. NON-ULCER DYSPEPSIA   GERD (gastroesophageal reflux disease) 2007   BPE NL ESO, REFLUX   Hiatal hernia    HIATAL HERNIA 11/23/2008   Qualifier: Diagnosis of  By: Zeb Comfort     HTN (hypertension)    Hyperlipidemia     SURGICAL HISTORY: Past Surgical History:  Procedure Laterality Date   COLONOSCOPY  APR 2009 with proprofol   SLF: SIMPLE ADENOMAS   MASTECTOMY W/ SENTINEL NODE BIOPSY Bilateral 05/01/2018   Procedure: BILATERAL MASTECTOMIES WITH LEFT SENTINEL LYMPH NODE BIOPSIES AND RIGHT AXILLARY LYMPH NODE DISSECTION;  Surgeon: Alphonsa Overall, MD;  Location: [facility];  Service: General;  Laterality: Bilateral;   PORTACATH PLACEMENT N/A 05/01/2018   Procedure: INSERTION PORT-A-CATH WITH [place];  Surgeon: Alphonsa Overall, MD;  Location: [facility];  Service: General;  Laterality: N/A;   UPPER GASTROINTESTINAL ENDOSCOPY  MAY 2012 DIL 16 mm   SLF: NO DEFINITE STRICTURE APPRECIATED   UPPER GASTROINTESTINAL ENDOSCOPY  FEB 2009-HYPOXIA REQUIRING NARCAN, & VERSED, D50V4   SLF: NL ESO, FUNDIC GLAND  POLYPS   UPPER GASTROINTESTINAL ENDOSCOPY  w/ DIL 2003, 2004, 2006    SOCIAL HISTORY: Social History   Socioeconomic History   Marital status: Single    Spouse name: Not on file   Number of children: Not on file   Years of education: Not on file   Highest education level: Not on file  Occupational History   Not on file  Tobacco Use   Smoking status: Never   Smokeless tobacco: Never  Substance and Sexual Activity   Alcohol use: No   Drug use: Yes    Types: PCP   Sexual activity: Not Currently    Birth control/protection: None  Other Topics Concern   Not on file  Social History Narrative   Not on file   Social Determinants of Health   Financial Resource Strain: Not on file  Food Insecurity: Not on file  Transportation Needs: Not on file  Physical Activity: Not on file  Stress: Not on file  Social Connections: Not on file  Intimate Partner Violence: Not on file    FAMILY HISTORY: Family History  Problem Relation Age of Onset   Colon cancer Mother        LATE 50S    Review of Systems  Constitutional:  Negative for  appetite change, chills, fatigue, fever and unexpected weight change.  HENT:   Negative for hearing loss, lump/mass and trouble swallowing.   Eyes:  Negative for eye problems and icterus.  Respiratory:  Negative for chest tightness, cough and shortness of breath.   Cardiovascular:  Negative for chest pain, leg swelling and palpitations.  Gastrointestinal:  Negative for abdominal distention, abdominal pain, constipation, diarrhea, nausea and vomiting.  Endocrine: Negative for hot flashes.  Genitourinary:  Negative for difficulty urinating.   Musculoskeletal:  Negative for arthralgias.  Skin:  Negative for itching and rash.  Neurological:  Negative for dizziness, extremity weakness, headaches and numbness.  Hematological:  Negative for adenopathy. Does not bruise/bleed easily.  Psychiatric/Behavioral:  Negative for depression. The patient is not  nervous/anxious.     PHYSICAL EXAMINATION  ECOG PERFORMANCE STATUS: 1 - Symptomatic but completely ambulatory  Vitals:   05/29/22 0931  BP: (!) 150/84  Pulse: 86  Resp: 15  Temp: 97.7 F (36.5 C)  SpO2: 99%    Physical Exam Constitutional:      General: She is not in acute distress.    Appearance: Normal appearance. She is not toxic-appearing.  HENT:     Head: Normocephalic and atraumatic.  Eyes:     General: No scleral icterus. Cardiovascular:     Rate and Rhythm: Normal rate and regular rhythm.     Pulses: Normal pulses.     Heart sounds: Normal heart sounds.  Pulmonary:     Effort: Pulmonary effort is normal.     Breath sounds: Normal breath sounds.  Abdominal:     General: Abdomen is flat. Bowel sounds are normal. There is no distension.     Palpations: Abdomen is soft.     Tenderness: There is no abdominal tenderness.  Musculoskeletal:        General: No swelling.     Cervical back: Neck supple.  Lymphadenopathy:     Cervical: No cervical adenopathy.  Skin:    General: Skin is warm and dry.     Findings: No rash.  Neurological:     General: No focal deficit present.     Mental Status: She is alert.  Psychiatric:        Mood and Affect: Mood normal.        Behavior: Behavior normal.     LABORATORY DATA:  CBC    Component Value Date/Time   WBC 8.1 05/29/2022 0914   WBC 7.8 04/21/2018 1333   RBC 3.77 (L) 05/29/2022 0914   HGB 11.5 (L) 05/29/2022 0914   HGB 12.8 06/09/2017 1137   HCT 34.3 (L) 05/29/2022 0914   HCT 39.2 06/09/2017 1137   PLT 319 05/29/2022 0914   PLT 312 06/09/2017 1137   MCV 91.0 05/29/2022 0914   MCV 92 06/09/2017 1137   MCH 30.5 05/29/2022 0914   MCHC 33.5 05/29/2022 0914   RDW 13.4 05/29/2022 0914   RDW 14.2 06/09/2017 1137   LYMPHSABS 2.1 05/29/2022 0914   LYMPHSABS 2.3 06/09/2017 1137   MONOABS 0.5 05/29/2022 0914   EOSABS 0.1 05/29/2022 0914   EOSABS 0.1 06/09/2017 1137   BASOSABS 0.0 05/29/2022 0914   BASOSABS 0.0  06/09/2017 1137    CMP     Component Value Date/Time   NA 139 05/29/2022 0914   NA 145 (H) 10/07/2017 0831   K 3.7 05/29/2022 0914   CL 108 05/29/2022 0914   CO2 26 05/29/2022 0914   GLUCOSE 161 (H) 05/29/2022 0914   BUN 17 05/29/2022  0914   BUN 12 10/07/2017 0831   CREATININE 0.60 05/29/2022 0914   CREATININE 0.54 09/16/2012 1230   CALCIUM 8.9 05/29/2022 0914   PROT 7.2 05/29/2022 0914   PROT 7.2 06/09/2017 1137   ALBUMIN 3.7 05/29/2022 0914   ALBUMIN 4.2 06/09/2017 1137   AST 12 (L) 05/29/2022 0914   ALT 15 05/29/2022 0914   ALKPHOS 48 05/29/2022 0914   BILITOT 0.3 05/29/2022 0914   GFRNONAA >60 05/29/2022 0914   GFRNONAA >89 09/16/2012 1230   GFRAA >60 02/10/2020 1050   GFRAA >89 09/16/2012 1230     ASSESSMENT and THERAPY PLAN:   Malignant neoplasm of upper-outer quadrant of right breast in female, estrogen receptor positive ([place]) [name] is a 57 year old woman with metastatic ER positive, HER2 positive breast cancer currently on treatment with Herceptin given every 6 weeks.  She has no clinical or radiographic signs of breast cancer progression.  We reviewed her most recent restaging scans which remain stable.  She will continue on Herceptin and tamoxifen.  I reviewed her CT scan and echocardiogram results with her in detail.  I discussed that in looking at her cardiac risks she appears to be at low risk however we will repeat an A1c along with lipid panel as these were last completed in 2019.  Laquan will return in 6 weeks for labs, follow-up, and her next treatment.  All questions were answered. The patient knows to call the clinic with any problems, questions or concerns. We can certainly see the patient much sooner if necessary.  Total encounter time:30 minutes*in face-to-face visit time, chart review, lab review, care coordination, order entry, and documentation of the encounter time.    Wilber Bihari, NP 05/29/22 10:44 AM Medical Oncology and  Hematology [facility] [place], [place] [address] Tel. [phone]    Fax. [phone]  *Total Encounter Time as defined by the Centers for Medicare and Medicaid Services includes, in addition to the face-to-face time of a patient visit (documented in the note above) non-face-to-face time: obtaining and reviewing outside history, ordering and reviewing medications, tests or procedures, care coordination (communications with other health care professionals or caregivers) and documentation in the medical record.

## 2022-05-29 NOTE — Assessment & Plan Note (Signed)
[name] is a 57 year old woman with metastatic ER positive, HER2 positive breast cancer currently on treatment with Herceptin given every 6 weeks.  She has no clinical or radiographic signs of breast cancer progression.  We reviewed her most recent restaging scans which remain stable.  She will continue on Herceptin and tamoxifen.  I reviewed her CT scan and echocardiogram results with her in detail.  I discussed that in looking at her cardiac risks she appears to be at low risk however we will repeat an A1c along with lipid panel as these were last completed in 2019.  [name] will return in 6 weeks for labs, follow-up, and her next treatment.

## 2022-05-31 ENCOUNTER — Telehealth: Payer: Self-pay | Admitting: Adult Health

## 2022-05-31 ENCOUNTER — Other Ambulatory Visit: Payer: Self-pay | Admitting: Adult Health

## 2022-05-31 ENCOUNTER — Telehealth: Payer: Self-pay

## 2022-05-31 DIAGNOSIS — E785 Hyperlipidemia, unspecified: Secondary | ICD-10-CM

## 2022-05-31 DIAGNOSIS — I10 Essential (primary) hypertension: Secondary | ICD-10-CM

## 2022-05-31 DIAGNOSIS — C7951 Secondary malignant neoplasm of bone: Secondary | ICD-10-CM

## 2022-05-31 DIAGNOSIS — E119 Type 2 diabetes mellitus without complications: Secondary | ICD-10-CM

## 2022-05-31 NOTE — Telephone Encounter (Signed)
Scheduled appointment per 1/17 los. Patient is aware of all made appointments.

## 2022-05-31 NOTE — Progress Notes (Signed)
Pt agreeable to [facility], labs faxed to [phone] with receipt confirmation.

## 2022-05-31 NOTE — Telephone Encounter (Signed)
-----  Message from Gardenia Phlegm, NP sent at 05/30/2022  9:43 PM EST ----- Please let patient know that her A1C is consistent with type two diabetes. Please fax results to her PCP so she can start management. I recommended that she see cardiology to discuss her risk. If agreeable, please let me know so I can coordinate.  ----- Message ----- From: Interface, Lab In [facility] Sent: 05/29/2022   2:43 PM EST To: Gardenia Phlegm, NP

## 2022-05-31 NOTE — Telephone Encounter (Signed)
Called Pts sister and given below message. Sister stated that current PCP is too far and she will find one that is closer. Instructed sister to notify us with new PCP so results can be faxed. Sister agreeable to cardiology. Message relayed to NP.

## 2022-06-02 ENCOUNTER — Other Ambulatory Visit: Payer: Self-pay

## 2022-06-05 ENCOUNTER — Other Ambulatory Visit: Payer: Self-pay

## 2022-06-10 NOTE — Progress Notes (Unsigned)
Cardiology Office Note:    Date:  06/10/2022   ID:  [name], DOB 1966/02/25, MRN [id number]  PCP:  Janora Norlander, [place] Providers Cardiologist:  None { Click to update primary MD,subspecialty MD or APP then REFRESH:1}    Referring MD: Janora Norlander, DO   No chief complaint on file. Cardio-Oncology clinic-high CVD risk, HER2 therapy  History of Present Illness:    [name] is a 57 y.o. female with a hx of cerebral palsy, R BC diagnosed 03/18/2018  ER+ Her2+, BL mastectomy, on tamoxifen since 2020, bone mets noted 10/27/2018, on Her2 q6 weeks, ECOG 1, DM2      The 10-year ASCVD risk score (Arnett DK, et al., 2019) is: 13%   Values used to calculate the score:     Age: 57 years     Sex: Female     Is Non-Hispanic African American: Yes     Diabetic: Yes     Tobacco smoker: No     Systolic Blood Pressure: 700 mmHg     Is BP treated: Yes     HDL Cholesterol: 44 mg/dL     Total Cholesterol: 157 mg/dL  Non smoker    Cardiology Studies: 04/01/2018-EF nl, RV fxn nl, mild LVH, GLS -17, no sig valve dx 07/24/2018- EF 55-60, GLS -20, no sig valve dx, trivial pericardial effusion 10/2018-05/20/2022 TTE GLS -12.4% 11/04/2018 GLS -14 06/22/2019 GLS -17% 10/27/2019 GLS -13.5% 03/23/2020 GLS -20.5% 11/09/2020 GLS -22 06/05/2021 GLS -22.5% 11/19/2021 GLS -18/9%, Grade II DD E/e' 17 05/20/2022  Past Medical History:  Diagnosis Date   Arthritis    Cerebral palsy ([place])    Complication of anesthesia 12/09/2013   Respiratory instability with 10 mg Propofol.Poor gag reflex.   Constipation - functional    Dysphagia 2003   2O to GERD/HH, ?difficulty with large food bolus due to neuromuscular weakness   Epigastric pain MAY 2012 ABD [place] NL GBLIVPAN   2o to GERD V. NON-ULCER DYSPEPSIA   GERD (gastroesophageal reflux disease) 2007   BPE NL ESO, REFLUX   Hiatal hernia    HIATAL HERNIA 11/23/2008   Qualifier: Diagnosis of  By: Zeb Comfort     HTN  (hypertension)    Hyperlipidemia     Past Surgical History:  Procedure Laterality Date   COLONOSCOPY  APR 2009 with proprofol   SLF: SIMPLE ADENOMAS   MASTECTOMY W/ SENTINEL NODE BIOPSY Bilateral 05/01/2018   Procedure: BILATERAL MASTECTOMIES WITH LEFT SENTINEL LYMPH NODE BIOPSIES AND RIGHT AXILLARY LYMPH NODE DISSECTION;  Surgeon: Alphonsa Overall, MD;  Location: [facility];  Service: General;  Laterality: Bilateral;   PORTACATH PLACEMENT N/A 05/01/2018   Procedure: INSERTION PORT-A-CATH WITH [place];  Surgeon: Alphonsa Overall, MD;  Location: [facility];  Service: General;  Laterality: N/A;   UPPER GASTROINTESTINAL ENDOSCOPY  MAY 2012 DIL 16 mm   SLF: NO DEFINITE STRICTURE APPRECIATED   UPPER GASTROINTESTINAL ENDOSCOPY  FEB 2009-HYPOXIA REQUIRING NARCAN, & VERSED, D50V4   SLF: NL ESO, FUNDIC GLAND POLYPS   UPPER GASTROINTESTINAL ENDOSCOPY  w/ DIL 2003, 2004, 2006    Current Medications: No outpatient medications have been marked as taking for the 06/11/22 encounter (Appointment) with Janina Mayo, MD.     Allergies:   Hydrocodone   Social History   Socioeconomic History   Marital status: Single    Spouse name: Not on file   Number of children: Not on file   Years of education: Not on  file   Highest education level: Not on file  Occupational History   Not on file  Tobacco Use   Smoking status: Never   Smokeless tobacco: Never  Substance and Sexual Activity   Alcohol use: No   Drug use: Yes    Types: PCP   Sexual activity: Not Currently    Birth control/protection: None  Other Topics Concern   Not on file  Social History Narrative   Not on file   Social Determinants of Health   Financial Resource Strain: Not on file  Food Insecurity: Not on file  Transportation Needs: Not on file  Physical Activity: Not on file  Stress: Not on file  Social Connections: Not on file     Family History: The patient's ***family history includes Colon cancer in her mother.  ROS:   Please see the  history of present illness.    *** All other systems reviewed and are negative.  EKGs/Labs/Other Studies Reviewed:    The following studies were reviewed today: ***  EKG:  EKG is *** ordered today.  The ekg ordered today demonstrates ***  Recent Labs: 05/29/2022: ALT 15; BUN 17; Creatinine 0.60; Hemoglobin 11.5; Platelet Count 319; Potassium 3.7; Sodium 139  Recent Lipid Panel    Component Value Date/Time   CHOL 157 05/29/2022 1127   CHOL 143 06/09/2017 1137   CHOL 161 09/16/2012 1230   TRIG 70 05/29/2022 1127   TRIG 57 01/18/2013 1318   TRIG 58 09/16/2012 1230   HDL 44 05/29/2022 1127   HDL 51 06/09/2017 1137   HDL 66 01/18/2013 1318   HDL 68 09/16/2012 1230   CHOLHDL 3.6 05/29/2022 1127   VLDL 14 05/29/2022 1127   LDLCALC 99 05/29/2022 1127   LDLCALC 81 06/09/2017 1137   LDLCALC 91 01/18/2013 1318   LDLCALC 81 09/16/2012 1230   LDLDIRECT 74 02/01/2016 1623     Risk Assessment/Calculations:   {Does this patient have ATRIAL FIBRILLATION?[phone]}  No BP recorded.  {Refresh Note OR Click here to enter BP  :1}***         Physical Exam:    VS:  There were no vitals taken for this visit.    Wt Readings from Last 3 Encounters:  03/06/22 125 lb 9.6 oz (57 kg)  12/12/21 117 lb 6.4 oz (53.3 kg)  09/19/21 116 lb 8 oz (52.8 kg)     GEN: *** Well nourished, well developed in no acute distress HEENT: Normal NECK: No JVD; No carotid bruits LYMPHATICS: No lymphadenopathy CARDIAC: ***RRR, no murmurs, rubs, gallops RESPIRATORY:  Clear to auscultation without rales, wheezing or rhonchi  ABDOMEN: Soft, non-tender, non-distended MUSCULOSKELETAL:  No edema; No deformity  SKIN: Warm and dry NEUROLOGIC:  Alert and oriented x 3 PSYCHIATRIC:  Normal affect   ASSESSMENT:    Metastatic BC: R BC. No L sided XRT. on her2 q 6 weeks - prior TTE's had reduced strain that has improved - space out TTE, q 4 months.  5/20204 , limited TTE for GLS  DM2: A1c 7.6. will start  metformin***  Lipids: LDL 99, goal < 100 mg/dL. At goal. With Dm2 diagnosis, recommend statin therapy. Continue pravastatin 40 mg daily   HTN: *** controlled. *** lisinopril 40 mg    PLAN:    In order of problems listed above:  limited TTE for GLS, EF Start metformin 500 mg      {Are you ordering a CV Procedure (e.g. stress test, cath, DCCV, TEE, etc)?   Press F2        :  [id number]}    Medication Adjustments/Labs and Tests Ordered: Current medicines are reviewed at length with the patient today.  Concerns regarding medicines are outlined above.  No orders of the defined types were placed in this encounter.  No orders of the defined types were placed in this encounter.   There are no Patient Instructions on file for this visit.   Signed, Janina Mayo, MD  06/10/2022 8:21 AM    [facility]

## 2022-06-11 ENCOUNTER — Ambulatory Visit: Payer: 59 | Attending: Internal Medicine | Admitting: Internal Medicine

## 2022-06-11 ENCOUNTER — Encounter: Payer: Self-pay | Admitting: Internal Medicine

## 2022-06-11 VITALS — BP 156/84 | HR 73 | Ht 62.0 in | Wt 128.0 lb

## 2022-06-11 DIAGNOSIS — Z9221 Personal history of antineoplastic chemotherapy: Secondary | ICD-10-CM | POA: Diagnosis not present

## 2022-06-11 MED ORDER — CARVEDILOL 3.125 MG PO TABS: 3.1250 mg | ORAL_TABLET | Freq: Two times a day (BID) | ORAL | 3 refills | Status: DC

## 2022-06-11 MED ORDER — DAPAGLIFLOZIN PROPANEDIOL 10 MG PO TABS: 10.0000 mg | ORAL_TABLET | Freq: Every day | ORAL | 3 refills | Status: DC

## 2022-06-11 MED ORDER — LOSARTAN POTASSIUM 50 MG PO TABS: 50.0000 mg | ORAL_TABLET | Freq: Every day | ORAL | 3 refills | Status: DC

## 2022-06-11 NOTE — Patient Instructions (Addendum)
Medication Instructions:   STOP LISINOPRIL  START: CARVEDILOL 3.'125mg'$  TWICE DAILY   START: FARXIGA- '10mg'$  ONCE DAILY WITH BREAKFAST   START: LOSARTAN '50mg'$  ONCE DAILY   *If you need a refill on your cardiac medications before your next appointment, please call your pharmacy*  Lab Work: Please return for Blood Work in [place]. No appointment needed, lab here at the office is open Monday-Friday from 8AM to 4PM and closed daily for lunch from 12:45-1:45.   If you have labs (blood work) drawn today and your tests are completely normal, you will receive your results only by: [place] (if you have MyChart) OR A paper copy in the mail If you have any lab test that is abnormal or we need to change your treatment, we will call you to review the results.  Testing/Procedures: Your physician has requested that you have an echocardiogram IN JULY. Echocardiography is a painless test that uses sound waves to create images of your heart. It provides your doctor with information about the size and shape of your heart and how well your heart's chambers and valves are working. You may receive an ultrasound enhancing agent through an IV if needed to better visualize your heart during the echo.This procedure takes approximately one hour. There are no restrictions for this procedure. This will take place at the [address].   Follow-Up: At [facility], you and your health needs are our priority.  As part of our continuing mission to provide you with exceptional heart care, we have created designated Provider Care Teams.  These Care Teams include your primary Cardiologist (physician) and Advanced Practice Providers (APPs -  Physician Assistants and Nurse Practitioners) who all work together to provide you with the care you need, when you need it.  Your next appointment:   3 month(s)  Provider:   Janina Mayo, MD

## 2022-06-12 ENCOUNTER — Other Ambulatory Visit: Payer: Self-pay

## 2022-06-12 NOTE — Addendum Note (Signed)
Addended by: Hinton Dyer on: 06/12/2022 02:37 PM   Modules accepted: Orders

## 2022-06-25 ENCOUNTER — Encounter: Payer: Self-pay | Admitting: Hematology and Oncology

## 2022-06-25 DIAGNOSIS — Z9221 Personal history of antineoplastic chemotherapy: Secondary | ICD-10-CM | POA: Diagnosis not present

## 2022-06-25 DIAGNOSIS — C7951 Secondary malignant neoplasm of bone: Secondary | ICD-10-CM | POA: Diagnosis not present

## 2022-06-26 ENCOUNTER — Encounter: Payer: Self-pay | Admitting: Hematology and Oncology

## 2022-06-26 LAB — BASIC METABOLIC PANEL
BUN/Creatinine Ratio: 19 (ref 9–23)
BUN: 13 mg/dL (ref 6–24)
CO2: 22 mmol/L (ref 20–29)
Calcium: 9.7 mg/dL (ref 8.7–10.2)
Chloride: 108 mmol/L — ABNORMAL HIGH (ref 96–106)
Creatinine, Ser: 0.68 mg/dL (ref 0.57–1.00)
Glucose: 106 mg/dL — ABNORMAL HIGH (ref 70–99)
Potassium: 4.1 mmol/L (ref 3.5–5.2)
Sodium: 146 mmol/L — ABNORMAL HIGH (ref 134–144)
eGFR: 100 mL/min/{1.73_m2} (ref >59)

## 2022-06-26 LAB — BRAIN NATRIURETIC PEPTIDE: BNP: <2.5 pg/mL (ref 0.0–100.0)

## 2022-07-08 ENCOUNTER — Other Ambulatory Visit: Payer: Self-pay

## 2022-07-08 DIAGNOSIS — C7951 Secondary malignant neoplasm of bone: Secondary | ICD-10-CM

## 2022-07-08 DIAGNOSIS — E785 Hyperlipidemia, unspecified: Secondary | ICD-10-CM

## 2022-07-08 NOTE — Progress Notes (Signed)
Patient Care Team: Janora Norlander, DO as PCP - General (Family Medicine) Janina Mayo, MD as PCP - Cardiology (Cardiology) Nicholas Lose, MD as Consulting Physician (Hematology and Oncology)  DIAGNOSIS: No diagnosis found.  SUMMARY OF ONCOLOGIC HISTORY: Oncology History  Malignant neoplasm of upper-outer quadrant of right breast in female, estrogen receptor positive ([place])  03/18/2018 Initial Diagnosis   Screening detected bilateral breast abnormalities.  Right breast mass UOQ 2.1 cm at 10 o'clock position: Grade 1 ILC ER 70%, PR 0%, HER-2 +3+ by IHC, Ki-67 5%, suspected lymph node in the axilla could not be biopsied; left breast calcifications by ultrasound not visible but 5 cm cystic/tubular structure which on biopsy was fibrocystic change with ADHl; T2N0 stage IIa clinical stage   05/01/2018 Surgery   Bilateral mastectomies: Left mastectomy: DCIS intermediate grade 0.9 cm margins negative Tis NX stage 0; right mastectomy: ILC, grade 3, 3.5 cm, with LCIS, perineural invasion present, margins negative, 15/22 lymph nodes positive with extracapsular extension, ER 90%, PR 0%, HER-2 +(IHC 3+), Ki-67 5%, T2N3 Stage 3B    05/11/2018 Cancer Staging   Staging form: Breast, AJCC 8th Edition - Pathologic stage from 05/11/2018: Stage IIIB (pT2, pN3, cM0, G3, ER+, PR-, HER2+) - Signed by Nicholas Lose, MD on 05/11/2018   05/2018 -  Anti-estrogen oral therapy   Tamoxifen daily   06/03/2018 Imaging   Bone scan: Increased tracer uptake in the lumbar spine at L1, L3 and L4, right fourth and seventh ribs, inferior right scapula and distal sternum, T7 and T11 as well.   CT CAP showed widespread bone metastases with possible pathologic fracture at L3, peripheral right upper lobe nodules nonspecific   06/10/2018 -  Chemotherapy   Taxol Kingenti and Perjeta    10/27/2018 PET scan   Bone metastases less metabolically active.  Couple of lymph nodes with low-level metabolic activity noted.      CHIEF COMPLIANT: Follow-up of metastatic breast cancer on herceptin   INTERVAL HISTORY: [name] is a 57 y.o. with above-mentioned history of metastatic breast cancer currently on treatment with maintenance Kingenti and on anti-estrogen therapy with anastrozole. She presents to the clinic today for a follow-up.    ALLERGIES:  is allergic to hydrocodone.  MEDICATIONS:  Current Outpatient Medications  Medication Sig Dispense Refill   carvedilol (COREG) 3.125 MG tablet Take 1 tablet (3.125 mg total) by mouth 2 (two) times daily. 180 tablet 3   dapagliflozin propanediol (FARXIGA) 10 MG TABS tablet Take 1 tablet (10 mg total) by mouth daily before breakfast. 30 tablet 3   diphenoxylate-atropine (LOMOTIL) 2.5-0.025 MG tablet TAKE 1 TO 2 TABLETS BY MOUTH 4 TIMES A DAY AS NEEDED FOR DIARRHEA 40 tablet 1   ibuprofen (ADVIL,MOTRIN) 600 MG tablet Take 1 tablet (600 mg total) by mouth every 6 (six) hours as needed (for mild pain not relieved by other medications.). 30 tablet 0   losartan (COZAAR) 50 MG tablet Take 1 tablet (50 mg total) by mouth daily. 90 tablet 3   megestrol (MEGACE) 40 MG tablet Take 1 tablet (40 mg total) by mouth 2 (two) times daily. Take 3 tablets every day 90 tablet 7   pantoprazole (PROTONIX) 40 MG tablet TAKE 1 TABLET BY MOUTH 30 MINUTES PRIOR TO BREAKFAST AND SUPPER, 180 tablet 3   potassium chloride (KLOR-CON) 10 MEQ tablet TAKE 1 TABLET BY MOUTH EVERY DAY 90 tablet 1   pravastatin (PRAVACHOL) 40 MG tablet TAKE 1 TABLET BY MOUTH EVERY DAY 90 tablet 3  tamoxifen (NOLVADEX) 20 MG tablet TAKE 1 TABLET BY MOUTH EVERY DAY 90 tablet 3   No current facility-administered medications for this visit.    PHYSICAL EXAMINATION: ECOG PERFORMANCE STATUS: {CHL ONC ECOG PS:[phone]}  There were no vitals filed for this visit. There were no vitals filed for this visit.  BREAST:*** No palpable masses or nodules in either right or left breasts. No palpable axillary  supraclavicular or infraclavicular adenopathy no breast tenderness or nipple discharge. (exam performed in the presence of a chaperone)  LABORATORY DATA:  I have reviewed the data as listed    Latest Ref Rng & Units 06/25/2022   10:47 AM 05/29/2022    9:14 AM 05/27/2022   12:32 PM  CMP  Glucose 70 - 99 mg/dL 106  161    BUN 6 - 24 mg/dL 13  17    Creatinine 0.57 - 1.00 mg/dL 0.68  0.60  0.50   Sodium 134 - 144 mmol/L 146  139    Potassium 3.5 - 5.2 mmol/L 4.1  3.7    Chloride 96 - 106 mmol/L 108  108    CO2 20 - 29 mmol/L 22  26    Calcium 8.7 - 10.2 mg/dL 9.7  8.9    Total Protein 6.5 - 8.1 g/dL  7.2    Total Bilirubin 0.3 - 1.2 mg/dL  0.3    Alkaline Phos 38 - 126 U/L  48    AST 15 - 41 U/L  12    ALT 0 - 44 U/L  15      Lab Results  Component Value Date   WBC 8.1 05/29/2022   HGB 11.5 (L) 05/29/2022   HCT 34.3 (L) 05/29/2022   MCV 91.0 05/29/2022   PLT 319 05/29/2022   NEUTROABS 5.3 05/29/2022    ASSESSMENT & PLAN:  No problem-specific Assessment & Plan notes found for this encounter.    No orders of the defined types were placed in this encounter.  The patient has a good understanding of the overall plan. she agrees with it. she will call with any problems that may develop before the next visit here. Total time spent: 30 mins including face to face time and time spent for planning, charting and co-ordination of care   Suzzette Righter, [place] 07/08/22    I Gardiner Coins am acting as a Education administrator for [employer]  ***

## 2022-07-10 ENCOUNTER — Inpatient Hospital Stay (HOSPITAL_BASED_OUTPATIENT_CLINIC_OR_DEPARTMENT_OTHER): Payer: 59 | Admitting: Hematology and Oncology

## 2022-07-10 ENCOUNTER — Inpatient Hospital Stay: Payer: 59 | Attending: Hematology and Oncology

## 2022-07-10 ENCOUNTER — Inpatient Hospital Stay: Payer: 59

## 2022-07-10 VITALS — BP 134/87 | HR 84 | Temp 97.9°F | Resp 18 | Ht 62.0 in

## 2022-07-10 DIAGNOSIS — C7951 Secondary malignant neoplasm of bone: Secondary | ICD-10-CM

## 2022-07-10 DIAGNOSIS — Z170 Estrogen receptor positive status [ER+]: Secondary | ICD-10-CM | POA: Insufficient documentation

## 2022-07-10 DIAGNOSIS — E119 Type 2 diabetes mellitus without complications: Secondary | ICD-10-CM

## 2022-07-10 DIAGNOSIS — Z5112 Encounter for antineoplastic immunotherapy: Secondary | ICD-10-CM | POA: Diagnosis not present

## 2022-07-10 DIAGNOSIS — Z79811 Long term (current) use of aromatase inhibitors: Secondary | ICD-10-CM | POA: Insufficient documentation

## 2022-07-10 DIAGNOSIS — C50411 Malignant neoplasm of upper-outer quadrant of right female breast: Secondary | ICD-10-CM | POA: Diagnosis not present

## 2022-07-10 DIAGNOSIS — Z95828 Presence of other vascular implants and grafts: Secondary | ICD-10-CM

## 2022-07-10 LAB — CMP (CANCER CENTER ONLY)
ALT: 10 U/L (ref 0–44)
AST: 11 U/L — ABNORMAL LOW (ref 15–41)
Albumin: 3.9 g/dL (ref 3.5–5.0)
Alkaline Phosphatase: 48 U/L (ref 38–126)
Anion gap: 7 (ref 5–15)
BUN: 17 mg/dL (ref 6–20)
CO2: 25 mmol/L (ref 22–32)
Calcium: 8.5 mg/dL — ABNORMAL LOW (ref 8.9–10.3)
Chloride: 108 mmol/L (ref 98–111)
Creatinine: 0.64 mg/dL (ref 0.44–1.00)
GFR, Estimated: >60 mL/min (ref >60)
Glucose, Bld: 118 mg/dL — ABNORMAL HIGH (ref 70–99)
Potassium: 3.6 mmol/L (ref 3.5–5.1)
Sodium: 140 mmol/L (ref 135–145)
Total Bilirubin: 0.3 mg/dL (ref 0.3–1.2)
Total Protein: 7 g/dL (ref 6.5–8.1)

## 2022-07-10 LAB — CBC WITH DIFFERENTIAL (CANCER CENTER ONLY)
Abs Immature Granulocytes: 0.03 10*3/uL (ref 0.00–0.07)
Basophils Absolute: 0.1 10*3/uL (ref 0.0–0.1)
Basophils Relative: 1 %
Eosinophils Absolute: 0.1 10*3/uL (ref 0.0–0.5)
Eosinophils Relative: 1 %
HCT: 36.8 % (ref 36.0–46.0)
Hemoglobin: 12.1 g/dL (ref 12.0–15.0)
Immature Granulocytes: 0 %
Lymphocytes Relative: 29 %
Lymphs Abs: 2.5 10*3/uL (ref 0.7–4.0)
MCH: 30.4 pg (ref 26.0–34.0)
MCHC: 32.9 g/dL (ref 30.0–36.0)
MCV: 92.5 fL (ref 80.0–100.0)
Monocytes Absolute: 0.4 10*3/uL (ref 0.1–1.0)
Monocytes Relative: 5 %
Neutro Abs: 5.4 10*3/uL (ref 1.7–7.7)
Neutrophils Relative %: 64 %
Platelet Count: 301 10*3/uL (ref 150–400)
RBC: 3.98 MIL/uL (ref 3.87–5.11)
RDW: 13.2 % (ref 11.5–15.5)
WBC Count: 8.5 10*3/uL (ref 4.0–10.5)
nRBC: 0 % (ref 0.0–0.2)

## 2022-07-10 MED ORDER — SODIUM CHLORIDE 0.9% FLUSH
10.0000 mL | INTRAVENOUS | Status: DC | PRN
  Administered 2022-07-10: 10 mL

## 2022-07-10 MED ORDER — HEPARIN SOD (PORK) LOCK FLUSH 100 UNIT/ML IV SOLN
500.0000 [IU] | Freq: Once | INTRAVENOUS | Status: AC | PRN
  Administered 2022-07-10: 500 [IU]

## 2022-07-10 MED ORDER — TRASTUZUMAB-ANNS CHEMO 420 MG IV SOLR
6.0000 mg/kg | Freq: Once | INTRAVENOUS | Status: AC
  Administered 2022-07-10: 357 mg via INTRAVENOUS
  Filled 2022-07-10: qty 17

## 2022-07-10 MED ORDER — SODIUM CHLORIDE 0.9 % IV SOLN: Freq: Once | INTRAVENOUS | Status: AC

## 2022-07-10 MED ORDER — DIPHENHYDRAMINE HCL 25 MG PO CAPS
25.0000 mg | ORAL_CAPSULE | Freq: Once | ORAL | Status: AC
  Administered 2022-07-10: 25 mg via ORAL
  Filled 2022-07-10: qty 1

## 2022-07-10 MED ORDER — ACETAMINOPHEN 325 MG PO TABS
650.0000 mg | ORAL_TABLET | Freq: Once | ORAL | Status: AC
  Administered 2022-07-10: 650 mg via ORAL
  Filled 2022-07-10: qty 2

## 2022-07-10 NOTE — Assessment & Plan Note (Signed)
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   CT CAP showed widespread bone metastases with possible pathologic fracture at L3, peripheral right upper lobe nodules nonspecific Patient has cerebral palsy and her sister is the power of attorney for healthcare Treatment plan: Palliative treatment with Taxol x12 cycles completed 08/26/2018, Kingenti, Perjeta ------------------------------------------------------------------------------------------------------------------------ PET CT scan: 10/27/2018: Widespread bone metastatic disease demonstrates reduced with sclerosis and low metabolic activity with SUV of 3-4.  Left level 4 lymph node 0.5 cm and left deep parotid lymph node SUV 4.1 will need follow-up.  Uterine leiomyomas.   Current treatment: Kingenti maintenance along with anastrozole She is enjoying time with her nephew. Anastrozole toxicities: Denies any hot flashes or myalgias. Kingenti toxicities: None     PET/CT 11/11/2019: Continued mild hypermetabolic activity left supraclavicular lymph node 0.7 cm, old sclerotic bone lesions are stable and not hypermetabolic.  Uterine fibroids   CT CAP 05/27/2022: Similarly treated bone metastases.  Pulmonary nodules felt to be similar   Return to clinic every 6 weeks for Kingenti  I will see her back in 3 months.

## 2022-07-10 NOTE — Patient Instructions (Signed)
[facility]  Discharge Instructions: Thank you for choosing [facility] to provide your oncology and hematology care.   If you have a lab appointment with the [facility], please go directly to the [facility] and check in at the registration area.   Wear comfortable clothing and clothing appropriate for easy access to any Portacath or PICC line.   We strive to give you quality time with your provider. You may need to reschedule your appointment if you arrive late (15 or more minutes).  Arriving late affects you and other patients whose appointments are after yours.  Also, if you miss three or more appointments without notifying the office, you may be dismissed from the clinic at the provider's discretion.      For prescription refill requests, have your pharmacy contact our office and allow 72 hours for refills to be completed.    Today you received the following chemotherapy and/or immunotherapy agents: Kanjinti      To help prevent nausea and vomiting after your treatment, we encourage you to take your nausea medication as directed.  BELOW ARE SYMPTOMS THAT SHOULD BE REPORTED IMMEDIATELY: *FEVER GREATER THAN 100.4 F (38 C) OR HIGHER *CHILLS OR SWEATING *NAUSEA AND VOMITING THAT IS NOT CONTROLLED WITH YOUR NAUSEA MEDICATION *UNUSUAL SHORTNESS OF BREATH *UNUSUAL BRUISING OR BLEEDING *URINARY PROBLEMS (pain or burning when urinating, or frequent urination) *BOWEL PROBLEMS (unusual diarrhea, constipation, pain near the anus) TENDERNESS IN MOUTH AND THROAT WITH OR WITHOUT PRESENCE OF ULCERS (sore throat, sores in mouth, or a toothache) UNUSUAL RASH, SWELLING OR PAIN  UNUSUAL VAGINAL DISCHARGE OR ITCHING   Items with * indicate a potential emergency and should be followed up as soon as possible or go to the Emergency Department if any problems should occur.  Please show the CHEMOTHERAPY ALERT CARD or IMMUNOTHERAPY ALERT CARD at  check-in to the Emergency Department and triage nurse.  Should you have questions after your visit or need to cancel or reschedule your appointment, please contact [facility]  Dept: [phone]  and follow the prompts.  Office hours are 8:00 a.m. to 4:30 p.m. Monday - Friday. Please note that voicemails left after 4:00 p.m. may not be returned until the following business day.  We are closed weekends and major holidays. You have access to a nurse at all times for urgent questions. Please call the main number to the clinic Dept: [phone] and follow the prompts.   For any non-urgent questions, you may also contact your provider using MyChart. We now offer e-Visits for anyone 14 and older to request care online for non-urgent symptoms. For details visit [email or web address].   Also download the MyChart app! Go to the app store, search "MyChart", open the app, select , and log in with your MyChart username and password.

## 2022-07-10 NOTE — Progress Notes (Signed)
OK to infuse Trastuzumab over 30 minutes.  Pt is on maintenance.  Kennith Center, Pharm.D., CPP 07/10/2022'@9'$ :43 AM

## 2022-07-12 ENCOUNTER — Encounter: Payer: Self-pay | Admitting: Hematology and Oncology

## 2022-07-22 ENCOUNTER — Other Ambulatory Visit: Payer: Self-pay | Admitting: Hematology and Oncology

## 2022-07-22 DIAGNOSIS — Z170 Estrogen receptor positive status [ER+]: Secondary | ICD-10-CM

## 2022-07-23 ENCOUNTER — Telehealth: Payer: Self-pay | Admitting: Hematology and Oncology

## 2022-07-23 ENCOUNTER — Other Ambulatory Visit: Payer: Self-pay | Admitting: Hematology and Oncology

## 2022-07-23 DIAGNOSIS — E785 Hyperlipidemia, unspecified: Secondary | ICD-10-CM

## 2022-07-23 NOTE — Telephone Encounter (Signed)
Scheduled and rescheduled appointment per WQ and room/resource. Patient is aware of all made appointments as well as any other changes made to her upcoming appointments.

## 2022-08-21 ENCOUNTER — Encounter: Payer: Self-pay | Admitting: Hematology and Oncology

## 2022-08-21 ENCOUNTER — Inpatient Hospital Stay: Payer: 59

## 2022-08-21 ENCOUNTER — Inpatient Hospital Stay: Payer: 59 | Admitting: Hematology and Oncology

## 2022-08-21 ENCOUNTER — Inpatient Hospital Stay: Payer: 59 | Attending: Hematology and Oncology

## 2022-08-21 VITALS — BP 142/84 | HR 74 | Temp 98.1°F | Resp 17

## 2022-08-21 DIAGNOSIS — Z5112 Encounter for antineoplastic immunotherapy: Secondary | ICD-10-CM | POA: Insufficient documentation

## 2022-08-21 DIAGNOSIS — C50411 Malignant neoplasm of upper-outer quadrant of right female breast: Secondary | ICD-10-CM | POA: Diagnosis not present

## 2022-08-21 DIAGNOSIS — Z170 Estrogen receptor positive status [ER+]: Secondary | ICD-10-CM | POA: Insufficient documentation

## 2022-08-21 DIAGNOSIS — Z79811 Long term (current) use of aromatase inhibitors: Secondary | ICD-10-CM | POA: Diagnosis not present

## 2022-08-21 DIAGNOSIS — C7951 Secondary malignant neoplasm of bone: Secondary | ICD-10-CM | POA: Diagnosis not present

## 2022-08-21 MED ORDER — SODIUM CHLORIDE 0.9 % IV SOLN: Freq: Once | INTRAVENOUS | Status: AC

## 2022-08-21 MED ORDER — ACETAMINOPHEN 325 MG PO TABS
650.0000 mg | ORAL_TABLET | Freq: Once | ORAL | Status: AC
  Administered 2022-08-21: 650 mg via ORAL
  Filled 2022-08-21: qty 2

## 2022-08-21 MED ORDER — SODIUM CHLORIDE 0.9% FLUSH
10.0000 mL | INTRAVENOUS | Status: DC | PRN
  Administered 2022-08-21: 10 mL

## 2022-08-21 MED ORDER — TRASTUZUMAB-ANNS CHEMO 150 MG IV SOLR
6.0000 mg/kg | Freq: Once | INTRAVENOUS | Status: AC
  Administered 2022-08-21: 357 mg via INTRAVENOUS
  Filled 2022-08-21: qty 17

## 2022-08-21 MED ORDER — HEPARIN SOD (PORK) LOCK FLUSH 100 UNIT/ML IV SOLN
500.0000 [IU] | Freq: Once | INTRAVENOUS | Status: AC | PRN
  Administered 2022-08-21: 500 [IU]

## 2022-08-21 MED ORDER — DIPHENHYDRAMINE HCL 25 MG PO CAPS
50.0000 mg | ORAL_CAPSULE | Freq: Once | ORAL | Status: AC
  Administered 2022-08-21: 50 mg via ORAL
  Filled 2022-08-21: qty 2

## 2022-08-21 NOTE — Progress Notes (Signed)
Confirmed w/ Dr. Pamelia Hoit: keep Trastuzumab at 6 mg/kg at freq of q6 weeks.  Ebony Hail, Pharm.D., CPP 08/21/2022@9 :07 AM

## 2022-09-03 ENCOUNTER — Encounter: Payer: Self-pay | Admitting: Hematology and Oncology

## 2022-09-05 ENCOUNTER — Other Ambulatory Visit: Payer: Self-pay | Admitting: Hematology and Oncology

## 2022-09-10 ENCOUNTER — Encounter: Payer: Self-pay | Admitting: Internal Medicine

## 2022-09-10 ENCOUNTER — Ambulatory Visit: Payer: 59 | Attending: Internal Medicine | Admitting: Internal Medicine

## 2022-09-10 VITALS — BP 112/72 | HR 78 | Ht 62.0 in | Wt 128.0 lb

## 2022-09-10 DIAGNOSIS — Z5181 Encounter for therapeutic drug level monitoring: Secondary | ICD-10-CM | POA: Diagnosis not present

## 2022-09-10 DIAGNOSIS — Z79899 Other long term (current) drug therapy: Secondary | ICD-10-CM

## 2022-09-10 NOTE — Patient Instructions (Signed)
Medication Instructions:   Your physician recommends that you continue on your current medications as directed. Please refer to the Current Medication list given to you today.  *If you need a refill on your cardiac medications before your next appointment, please call your pharmacy*  Lab Work: NONE ordered at this time of appointment   If you have labs (blood work) drawn today and your tests are completely normal, you will receive your results only by: MyChart Message (if you have MyChart) OR A paper copy in the mail If you have any lab test that is abnormal or we need to change your treatment, we will call you to review the results.  Testing/Procedures: NONE ordered at this time of appointment   Follow-Up: At [facility], you and your health needs are our priority.  As part of our continuing mission to provide you with exceptional heart care, we have created designated Provider Care Teams.  These Care Teams include your primary Cardiologist (physician) and Advanced Practice Providers (APPs -  Physician Assistants and Nurse Practitioners) who all work together to provide you with the care you need, when you need it.    Your next appointment:   6 month(s)  Provider:   Branch, Mary E, MD     Other Instructions   

## 2022-09-10 NOTE — Progress Notes (Signed)
Cardiology Office Note:    Date:  09/10/2022   ID:  DARIUS FILLINGIM, DOB [birth year], MRN [id number]  PCP:  Serena Croissant, MD   [facility] Providers Cardiologist:  Maisie Fus, MD     Referring MD: [name], DO   No chief complaint on file. Cardio-Oncology clinic-high CVD risk, HER2 therapy  History of Present Illness:    ANGENETTE DAILY is a 57 y.o. female with a hx of cerebral palsy, R BC diagnosed 03/18/2018  ER+ Her2+, BL mastectomy, on tamoxifen since 2020, bone mets noted 10/27/2018, on Her2 q6 weeks, ECOG 1, DM2 referral for assessing CVD risk on Her2 therapy  She comes in today with her care giver/her sister. She has cerebral palsy. She is in a wheel chair.  She denies SOB. No chest pressure. No cardiac disease history. Her mother had DM2, cardiac disease, renal disease. Father passed at 18 years old. Sister has kidney disease.   The 10-year ASCVD risk score (Arnett DK, et al., 2019) is: 13%   Values used to calculate the score:     Age: 67 years     Sex: Female     Is Non-Hispanic African American: Yes     Diabetic: Yes     Tobacco smoker: No     Systolic Blood Pressure: 128 mmHg     Is BP treated: Yes     HDL Cholesterol: 44 mg/dL     Total Cholesterol: 157 mg/dL  Non smoker  Interim hx 09/10/2022 She denies chest pain or SOB. She is here with her sister, they have family in town [place] blood pressure   Past Medical History:  Diagnosis Date   Arthritis    Cerebral palsy (HCC)    Complication of anesthesia 12/09/2013   Respiratory instability with 10 mg Propofol.Poor gag reflex.   Constipation - functional    Dysphagia 2003   2O to GERD/HH, ?difficulty with large food bolus due to neuromuscular weakness   Epigastric pain MAY 2012 ABD [place] NL GBLIVPAN   2o to GERD V. NON-ULCER DYSPEPSIA   GERD (gastroesophageal reflux disease) 2007   BPE NL ESO, REFLUX   Hiatal hernia    HIATAL HERNIA 11/23/2008   Qualifier: Diagnosis of  By: Diana Eves      HTN (hypertension)    Hyperlipidemia     Past Surgical History:  Procedure Laterality Date   COLONOSCOPY  APR 2009 with proprofol   SLF: SIMPLE ADENOMAS   MASTECTOMY W/ SENTINEL NODE BIOPSY Bilateral 05/01/2018   Procedure: BILATERAL MASTECTOMIES WITH LEFT SENTINEL LYMPH NODE BIOPSIES AND RIGHT AXILLARY LYMPH NODE DISSECTION;  Surgeon: Ovidio Kin, MD;  Location: [facility] OR;  Service: General;  Laterality: Bilateral;   PORTACATH PLACEMENT N/A 05/01/2018   Procedure: INSERTION PORT-A-CATH WITH [place];  Surgeon: Ovidio Kin, MD;  Location: [facility] OR;  Service: General;  Laterality: N/A;   UPPER GASTROINTESTINAL ENDOSCOPY  MAY 2012 DIL 16 mm   SLF: NO DEFINITE STRICTURE APPRECIATED   UPPER GASTROINTESTINAL ENDOSCOPY  FEB 2009-HYPOXIA REQUIRING NARCAN, & VERSED, D50V4   SLF: NL ESO, FUNDIC GLAND POLYPS   UPPER GASTROINTESTINAL ENDOSCOPY  w/ DIL 2003, 2004, 2006    Current Medications: Current Outpatient Medications on File Prior to Visit  Medication Sig Dispense Refill   carvedilol (COREG) 3.125 MG tablet Take 1 tablet (3.125 mg total) by mouth 2 (two) times daily. 180 tablet 3   dapagliflozin propanediol (FARXIGA) 10 MG TABS tablet Take 1 tablet (10 mg total) by mouth  daily before breakfast. 30 tablet 3   diphenoxylate-atropine (LOMOTIL) 2.5-0.025 MG tablet TAKE 1 TO 2 TABLETS BY MOUTH 4 TIMES A DAY AS NEEDED FOR DIARRHEA 40 tablet 1   ibuprofen (ADVIL,MOTRIN) 600 MG tablet Take 1 tablet (600 mg total) by mouth every 6 (six) hours as needed (for mild pain not relieved by other medications.). 30 tablet 0   losartan (COZAAR) 50 MG tablet Take 1 tablet (50 mg total) by mouth daily. 90 tablet 3   megestrol (MEGACE) 40 MG tablet Take 1 tablet (40 mg total) by mouth 2 (two) times daily. Take 3 tablets every day 90 tablet 7   Multiple Vitamin (MULTI-VITAMIN) tablet Take 1 tablet by mouth daily.     pantoprazole (PROTONIX) 40 MG tablet TAKE 1 TABLET BY MOUTH 30 MINUTES PRIOR TO BREAKFAST AND SUPPER, 180  tablet 3   potassium chloride (KLOR-CON) 10 MEQ tablet TAKE 1 TABLET BY MOUTH EVERY DAY 90 tablet 1   pravastatin (PRAVACHOL) 40 MG tablet TAKE 1 TABLET BY MOUTH EVERY DAY 90 tablet 3   tamoxifen (NOLVADEX) 20 MG tablet TAKE 1 TABLET BY MOUTH EVERY DAY 90 tablet 3   [DISCONTINUED] prochlorperazine (COMPAZINE) 10 MG tablet Take 1 tablet (10 mg total) by mouth every 6 (six) hours as needed (Nausea or vomiting). 30 tablet 1   No current facility-administered medications on file prior to visit.     Allergies:   Hydrocodone   Social History   Socioeconomic History   Marital status: Single    Spouse name: Not on file   Number of children: Not on file   Years of education: Not on file   Highest education level: Not on file  Occupational History   Not on file  Tobacco Use   Smoking status: Never   Smokeless tobacco: Never  Substance and Sexual Activity   Alcohol use: No   Drug use: Yes    Types: PCP   Sexual activity: Not Currently    Birth control/protection: None  Other Topics Concern   Not on file  Social History Narrative   Not on file   Social Determinants of Health   Financial Resource Strain: Not on file  Food Insecurity: Not on file  Transportation Needs: Not on file  Physical Activity: Not on file  Stress: Not on file  Social Connections: Not on file     Family History: The patient's family history includes Colon cancer in her mother.  ROS:   Please see the history of present illness.     All other systems reviewed and are negative.  EKGs/Labs/Other Studies Reviewed:    The following studies were reviewed today:   EKG:  EKG is  ordered today.  The ekg ordered today demonstrates   06/11/2022- NSR, LAD  04/01/2018-EF nl, RV fxn nl, mild LVH, GLS -17, no sig valve dx 07/24/2018- EF 55-60, GLS -20, no sig valve dx, trivial pericardial effusion 10/2018-05/20/2022 TTE GLS -12.4% 11/04/2018 GLS -14 06/22/2019 GLS -17% 10/27/2019 GLS -13.5% 03/23/2020 GLS -20.5%  11/09/2020 GLS -22 06/05/2021 GLS -22.5% 11/19/2021 GLS -18/9%, Grade II DD E/e' 17 05/20/2022  Recent Labs: 06/25/2022: BNP <2.5 07/10/2022: ALT 10; BUN 17; Creatinine 0.64; Hemoglobin 12.1; Platelet Count 301; Potassium 3.6; Sodium 140   Recent Lipid Panel    Component Value Date/Time   CHOL 157 05/29/2022 1127   CHOL 143 06/09/2017 1137   CHOL 161 09/16/2012 1230   TRIG 70 05/29/2022 1127   TRIG 57 01/18/2013 1318   TRIG 58  09/16/2012 1230   HDL 44 05/29/2022 1127   HDL 51 06/09/2017 1137   HDL 66 01/18/2013 1318   HDL 68 09/16/2012 1230   CHOLHDL 3.6 05/29/2022 1127   VLDL 14 05/29/2022 1127   LDLCALC 99 05/29/2022 1127   LDLCALC 81 06/09/2017 1137   LDLCALC 91 01/18/2013 1318   LDLCALC 81 09/16/2012 1230   LDLDIRECT 74 02/01/2016 1623     Risk Assessment/Calculations:     Physical Exam:    VS:   Vitals:   09/10/22 0912  BP: 112/72  Pulse: 78  SpO2: 92%     Wt Readings from Last 3 Encounters:  09/10/22 128 lb (58.1 kg)  06/11/22 128 lb (58.1 kg)  03/06/22 125 lb 9.6 oz (57 kg)     GEN:  sitting in a wheelchair. Well nourished, well developed in no acute distress HEENT: Normal NECK: No JVD; No carotid bruits LYMPHATICS: No lymphadenopathy CARDIAC: RRR, no murmurs, rubs, gallops RESPIRATORY:  Clear to auscultation without rales, wheezing or rhonchi  ABDOMEN: Soft, non-tender, non-distended MUSCULOSKELETAL:  No edema; No deformity  SKIN: Warm and dry NEUROLOGIC:  Alert and oriented x 3 PSYCHIATRIC:  Normal affect   ASSESSMENT:    Metastatic BC: R BC. No L sided XRT. on her2 q 6 weeks - doing very well  - prior TTE's had reduced strain that has improved - space out TTE, q 6 months.  7/20204 , limited TTE for GLS - escalating BP management/cardioprotection - bsl Bnp < 2.5  DM2: A1c 7.6. worries about metformin with difficulty with diarrhea. Started farxiga 10 mg daily. Planned for PCP appt this year  Lipids: LDL 99, goal < 100 mg/dL. At goal. With  Dm2 diagnosis, recommend statin therapy. Continue pravastatin 40 mg daily  HTN: Well controlled.  Stopped lisinopril 40 mg, and started losartan 50 mg daily and carvediolol 3.125 mg BID  PLAN:    In order of problems listed above:  No changes today Limited TTE for GLS planned in July Follow up 6 months      Medication Adjustments/Labs and Tests Ordered: Current medicines are reviewed at length with the patient today.  Concerns regarding medicines are outlined above.  No orders of the defined types were placed in this encounter.  No orders of the defined types were placed in this encounter.   Patient Instructions  Medication Instructions:   Your physician recommends that you continue on your current medications as directed. Please refer to the Current Medication list given to you today.  *If you need a refill on your cardiac medications before your next appointment, please call your pharmacy*  Lab Work: NONE ordered at this time of appointment   If you have labs (blood work) drawn today and your tests are completely normal, you will receive your results only by: MyChart Message (if you have MyChart) OR A paper copy in the mail If you have any lab test that is abnormal or we need to change your treatment, we will call you to review the results.  Testing/Procedures: NONE ordered at this time of appointment   Follow-Up: At [facility], you and your health needs are our priority.  As part of our continuing mission to provide you with exceptional heart care, we have created designated Provider Care Teams.  These Care Teams include your primary Cardiologist (physician) and Advanced Practice Providers (APPs -  Physician Assistants and Nurse Practitioners) who all work together to provide you with the care you need, when you need  it.    Your next appointment:   6 month(s)  Provider:   Maisie Fus, MD     Other Instructions     Signed, Maisie Fus, MD   09/10/2022 9:36 AM    [facility]

## 2022-09-11 ENCOUNTER — Other Ambulatory Visit: Payer: Self-pay

## 2022-10-01 ENCOUNTER — Other Ambulatory Visit: Payer: Self-pay

## 2022-10-01 DIAGNOSIS — Z170 Estrogen receptor positive status [ER+]: Secondary | ICD-10-CM

## 2022-10-01 NOTE — Progress Notes (Signed)
Patient Care Team: Serena Croissant, MD as PCP - General (Hematology and Oncology) Maisie Fus, MD as PCP - Cardiology (Cardiology) Serena Croissant, MD as Consulting Physician (Hematology and Oncology)  DIAGNOSIS: No diagnosis found.  SUMMARY OF ONCOLOGIC HISTORY: Oncology History  Malignant neoplasm of upper-outer quadrant of right breast in female, estrogen receptor positive (HCC)  03/18/2018 Initial Diagnosis   Screening detected bilateral breast abnormalities.  Right breast mass UOQ 2.1 cm at 10 o'clock position: Grade 1 ILC ER 70%, PR 0%, HER-2 +3+ by IHC, Ki-67 5%, suspected lymph node in the axilla could not be biopsied; left breast calcifications by ultrasound not visible but 5 cm cystic/tubular structure which on biopsy was fibrocystic change with ADHl; T2N0 stage IIa clinical stage   05/01/2018 Surgery   Bilateral mastectomies: Left mastectomy: DCIS intermediate grade 0.9 cm margins negative Tis NX stage 0; right mastectomy: ILC, grade 3, 3.5 cm, with LCIS, perineural invasion present, margins negative, 15/22 lymph nodes positive with extracapsular extension, ER 90%, PR 0%, HER-2 +(IHC 3+), Ki-67 5%, T2N3 Stage 3B    05/11/2018 Cancer Staging   Staging form: Breast, AJCC 8th Edition - Pathologic stage from 05/11/2018: Stage IIIB (pT2, pN3, cM0, G3, ER+, PR-, HER2+) - Signed by Serena Croissant, MD on 05/11/2018   05/2018 -  Anti-estrogen oral therapy   Tamoxifen daily   06/03/2018 Imaging   Bone scan: Increased tracer uptake in the lumbar spine at L1, L3 and L4, right fourth and seventh ribs, inferior right scapula and distal sternum, T7 and T11 as well.   CT CAP showed widespread bone metastases with possible pathologic fracture at L3, peripheral right upper lobe nodules nonspecific   06/10/2018 -  Chemotherapy   Taxol Kingenti and Perjeta    10/27/2018 PET scan   Bone metastases less metabolically active.  Couple of lymph nodes with low-level metabolic activity noted.    07/10/2022 - 07/10/2022 Chemotherapy   Patient is on Treatment Plan : BREAST Trastuzumab IV (8/6) or SQ (600) D1 q21d     08/21/2022 -  Chemotherapy   Patient is on Treatment Plan : BREAST MAINTENANCE Trastuzumab IV (6) or SQ (600) D1 q21d x 13 cycles     Malignant neoplasm metastatic to bone (HCC)  06/04/2018 Initial Diagnosis   Malignant neoplasm metastatic to bone (HCC)   07/10/2022 - 07/10/2022 Chemotherapy   Patient is on Treatment Plan : BREAST Trastuzumab IV (8/6) or SQ (600) D1 q21d       CHIEF COMPLIANT: Follow-up of metastatic breast cancer on herceptin   INTERVAL HISTORY: [name] is a 57 y.o. with above-mentioned history of metastatic breast cancer currently on treatment with maintenance Kingenti and on anti-estrogen therapy with anastrozole. She presents to the clinic today for a follow-up.    ALLERGIES:  is allergic to hydrocodone.  MEDICATIONS:  Current Outpatient Medications  Medication Sig Dispense Refill   carvedilol (COREG) 3.125 MG tablet Take 1 tablet (3.125 mg total) by mouth 2 (two) times daily. 180 tablet 3   dapagliflozin propanediol (FARXIGA) 10 MG TABS tablet Take 1 tablet (10 mg total) by mouth daily before breakfast. 30 tablet 3   diphenoxylate-atropine (LOMOTIL) 2.5-0.025 MG tablet TAKE 1 TO 2 TABLETS BY MOUTH 4 TIMES A DAY AS NEEDED FOR DIARRHEA 40 tablet 1   ibuprofen (ADVIL,MOTRIN) 600 MG tablet Take 1 tablet (600 mg total) by mouth every 6 (six) hours as needed (for mild pain not relieved by other medications.). 30 tablet 0   losartan (COZAAR)  50 MG tablet Take 1 tablet (50 mg total) by mouth daily. 90 tablet 3   megestrol (MEGACE) 40 MG tablet Take 1 tablet (40 mg total) by mouth 2 (two) times daily. Take 3 tablets every day 90 tablet 7   Multiple Vitamin (MULTI-VITAMIN) tablet Take 1 tablet by mouth daily.     pantoprazole (PROTONIX) 40 MG tablet TAKE 1 TABLET BY MOUTH 30 MINUTES PRIOR TO BREAKFAST AND SUPPER, 180 tablet 3   potassium chloride  (KLOR-CON) 10 MEQ tablet TAKE 1 TABLET BY MOUTH EVERY DAY 90 tablet 1   pravastatin (PRAVACHOL) 40 MG tablet TAKE 1 TABLET BY MOUTH EVERY DAY 90 tablet 3   tamoxifen (NOLVADEX) 20 MG tablet TAKE 1 TABLET BY MOUTH EVERY DAY 90 tablet 3   No current facility-administered medications for this visit.    PHYSICAL EXAMINATION: ECOG PERFORMANCE STATUS: {CHL ONC ECOG PS:[id number]}  There were no vitals filed for this visit. There were no vitals filed for this visit.  BREAST:*** No palpable masses or nodules in either right or left breasts. No palpable axillary supraclavicular or infraclavicular adenopathy no breast tenderness or nipple discharge. (exam performed in the presence of a chaperone)  LABORATORY DATA:  I have reviewed the data as listed    Latest Ref Rng & Units 07/10/2022    8:14 AM 06/25/2022   10:47 AM 05/29/2022    9:14 AM  CMP  Glucose 70 - 99 mg/dL 829  562  130   BUN 6 - 20 mg/dL 17  13  17    Creatinine 0.44 - 1.00 mg/dL 8.65  7.84  6.96   Sodium 135 - 145 mmol/L 140  146  139   Potassium 3.5 - 5.1 mmol/L 3.6  4.1  3.7   Chloride 98 - 111 mmol/L 108  108  108   CO2 22 - 32 mmol/L 25  22  26    Calcium 8.9 - 10.3 mg/dL 8.5  9.7  8.9   Total Protein 6.5 - 8.1 g/dL 7.0   7.2   Total Bilirubin 0.3 - 1.2 mg/dL 0.3   0.3   Alkaline Phos 38 - 126 U/L 48   48   AST 15 - 41 U/L 11   12   ALT 0 - 44 U/L 10   15     Lab Results  Component Value Date   WBC 8.5 07/10/2022   HGB 12.1 07/10/2022   HCT 36.8 07/10/2022   MCV 92.5 07/10/2022   PLT 301 07/10/2022   NEUTROABS 5.4 07/10/2022    ASSESSMENT & PLAN:  No problem-specific Assessment & Plan notes found for this encounter.    No orders of the defined types were placed in this encounter.  The patient has a good understanding of the overall plan. she agrees with it. she will call with any problems that may develop before the next visit here. Total time spent: 30 mins including face to face time and time spent for  planning, charting and co-ordination of care   Sherlyn Lick, CMA 10/01/22    I Janan Ridge am acting as a Neurosurgeon for [employer]  ***

## 2022-10-02 ENCOUNTER — Inpatient Hospital Stay (HOSPITAL_BASED_OUTPATIENT_CLINIC_OR_DEPARTMENT_OTHER): Payer: 59 | Admitting: Hematology and Oncology

## 2022-10-02 ENCOUNTER — Encounter: Payer: Self-pay | Admitting: Hematology and Oncology

## 2022-10-02 ENCOUNTER — Inpatient Hospital Stay: Payer: 59

## 2022-10-02 ENCOUNTER — Inpatient Hospital Stay: Payer: 59 | Attending: Hematology and Oncology

## 2022-10-02 VITALS — Temp 98.0°F | Wt 120.1 lb

## 2022-10-02 VITALS — BP 138/82 | HR 71 | Temp 100.6°F | Resp 18 | Wt 113.6 lb

## 2022-10-02 DIAGNOSIS — Z170 Estrogen receptor positive status [ER+]: Secondary | ICD-10-CM

## 2022-10-02 DIAGNOSIS — C7951 Secondary malignant neoplasm of bone: Secondary | ICD-10-CM | POA: Diagnosis not present

## 2022-10-02 DIAGNOSIS — Z9013 Acquired absence of bilateral breasts and nipples: Secondary | ICD-10-CM | POA: Diagnosis not present

## 2022-10-02 DIAGNOSIS — Z95828 Presence of other vascular implants and grafts: Secondary | ICD-10-CM

## 2022-10-02 DIAGNOSIS — Z79811 Long term (current) use of aromatase inhibitors: Secondary | ICD-10-CM | POA: Diagnosis not present

## 2022-10-02 DIAGNOSIS — Z5112 Encounter for antineoplastic immunotherapy: Secondary | ICD-10-CM | POA: Insufficient documentation

## 2022-10-02 DIAGNOSIS — C50411 Malignant neoplasm of upper-outer quadrant of right female breast: Secondary | ICD-10-CM | POA: Diagnosis not present

## 2022-10-02 DIAGNOSIS — E119 Type 2 diabetes mellitus without complications: Secondary | ICD-10-CM | POA: Insufficient documentation

## 2022-10-02 LAB — CBC WITH DIFFERENTIAL (CANCER CENTER ONLY)
Abs Immature Granulocytes: 0.03 10*3/uL (ref 0.00–0.07)
Basophils Absolute: 0.1 10*3/uL (ref 0.0–0.1)
Basophils Relative: 1 %
Eosinophils Absolute: 0.1 10*3/uL (ref 0.0–0.5)
Eosinophils Relative: 1 %
HCT: 37.1 % (ref 36.0–46.0)
Hemoglobin: 12.3 g/dL (ref 12.0–15.0)
Immature Granulocytes: 0 %
Lymphocytes Relative: 28 %
Lymphs Abs: 2.3 10*3/uL (ref 0.7–4.0)
MCH: 30.7 pg (ref 26.0–34.0)
MCHC: 33.2 g/dL (ref 30.0–36.0)
MCV: 92.5 fL (ref 80.0–100.0)
Monocytes Absolute: 0.5 10*3/uL (ref 0.1–1.0)
Monocytes Relative: 6 %
Neutro Abs: 5.2 10*3/uL (ref 1.7–7.7)
Neutrophils Relative %: 64 %
Platelet Count: 265 10*3/uL (ref 150–400)
RBC: 4.01 MIL/uL (ref 3.87–5.11)
RDW: 13.8 % (ref 11.5–15.5)
WBC Count: 8.1 10*3/uL (ref 4.0–10.5)
nRBC: 0 % (ref 0.0–0.2)

## 2022-10-02 LAB — CMP (CANCER CENTER ONLY)
ALT: 11 U/L (ref 0–44)
AST: 13 U/L — ABNORMAL LOW (ref 15–41)
Albumin: 3.9 g/dL (ref 3.5–5.0)
Alkaline Phosphatase: 50 U/L (ref 38–126)
Anion gap: 7 (ref 5–15)
BUN: 16 mg/dL (ref 6–20)
CO2: 25 mmol/L (ref 22–32)
Calcium: 8.9 mg/dL (ref 8.9–10.3)
Chloride: 108 mmol/L (ref 98–111)
Creatinine: 0.58 mg/dL (ref 0.44–1.00)
GFR, Estimated: >60 mL/min (ref >60)
Glucose, Bld: 102 mg/dL — ABNORMAL HIGH (ref 70–99)
Potassium: 3.8 mmol/L (ref 3.5–5.1)
Sodium: 140 mmol/L (ref 135–145)
Total Bilirubin: 0.3 mg/dL (ref 0.3–1.2)
Total Protein: 7.4 g/dL (ref 6.5–8.1)

## 2022-10-02 MED ORDER — ACETAMINOPHEN 325 MG PO TABS
650.0000 mg | ORAL_TABLET | Freq: Once | ORAL | Status: AC
  Administered 2022-10-02: 650 mg via ORAL
  Filled 2022-10-02: qty 2

## 2022-10-02 MED ORDER — SODIUM CHLORIDE 0.9% FLUSH
10.0000 mL | INTRAVENOUS | Status: DC | PRN
  Administered 2022-10-02: 10 mL

## 2022-10-02 MED ORDER — HEPARIN SOD (PORK) LOCK FLUSH 100 UNIT/ML IV SOLN
500.0000 [IU] | Freq: Once | INTRAVENOUS | Status: AC | PRN
  Administered 2022-10-02: 500 [IU]

## 2022-10-02 MED ORDER — DIPHENHYDRAMINE HCL 25 MG PO CAPS
50.0000 mg | ORAL_CAPSULE | Freq: Once | ORAL | Status: AC
  Administered 2022-10-02: 50 mg via ORAL
  Filled 2022-10-02: qty 2

## 2022-10-02 MED ORDER — SODIUM CHLORIDE 0.9 % IV SOLN: Freq: Once | INTRAVENOUS | Status: AC

## 2022-10-02 MED ORDER — TRASTUZUMAB-ANNS CHEMO 150 MG IV SOLR
6.0000 mg/kg | Freq: Once | INTRAVENOUS | Status: AC
  Administered 2022-10-02: 357 mg via INTRAVENOUS
  Filled 2022-10-02: qty 17

## 2022-10-02 NOTE — Assessment & Plan Note (Addendum)
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   CT CAP showed widespread bone metastases with possible pathologic fracture at L3, peripheral right upper lobe nodules nonspecific Patient has cerebral palsy and her sister is the power of attorney for healthcare Treatment plan: Palliative treatment with Taxol x12 cycles completed 08/26/2018, Kingenti, Perjeta ------------------------------------------------------------------------------------------------------------------------ PET CT scan: 10/27/2018: Widespread bone metastatic disease demonstrates reduced with sclerosis and low metabolic activity with SUV of 3-4.  Left level 4 lymph node 0.5 cm and left deep parotid lymph node SUV 4.1 will need follow-up.  Uterine leiomyomas.   Current treatment: Kingenti maintenance along with anastrozole She is enjoying time with her nephew. Anastrozole toxicities: Denies any hot flashes or myalgias. Kingenti toxicities: None     PET/CT 11/11/2019: Continued mild hypermetabolic activity left supraclavicular lymph node 0.7 cm, old sclerotic bone lesions are stable and not hypermetabolic.  Uterine fibroids   CT CAP 05/27/2022: Similarly treated bone metastases.  Pulmonary nodules felt to be similar   Diabetes: Patient is taking measures to decrease her sugar intake.  However she likes her sweet tea. Return to clinic every 6 weeks for Kingenti  I will see her back in 3 months with scans before visit.

## 2022-10-02 NOTE — Patient Instructions (Signed)
[facility]  Discharge Instructions: Thank you for choosing [facility] to provide your oncology and hematology care.   If you have a lab appointment with the Cancer Center, please go directly to the Cancer Center and check in at the registration area.   Wear comfortable clothing and clothing appropriate for easy access to any Portacath or PICC line.   We strive to give you quality time with your provider. You may need to reschedule your appointment if you arrive late (15 or more minutes).  Arriving late affects you and other patients whose appointments are after yours.  Also, if you miss three or more appointments without notifying the office, you may be dismissed from the clinic at the provider's discretion.      For prescription refill requests, have your pharmacy contact our office and allow 72 hours for refills to be completed.    Today you received the following chemotherapy and/or immunotherapy agents: Kanjinti.      To help prevent nausea and vomiting after your treatment, we encourage you to take your nausea medication as directed.  BELOW ARE SYMPTOMS THAT SHOULD BE REPORTED IMMEDIATELY: *FEVER GREATER THAN 100.4 F (38 C) OR HIGHER *CHILLS OR SWEATING *NAUSEA AND VOMITING THAT IS NOT CONTROLLED WITH YOUR NAUSEA MEDICATION *UNUSUAL SHORTNESS OF BREATH *UNUSUAL BRUISING OR BLEEDING *URINARY PROBLEMS (pain or burning when urinating, or frequent urination) *BOWEL PROBLEMS (unusual diarrhea, constipation, pain near the anus) TENDERNESS IN MOUTH AND THROAT WITH OR WITHOUT PRESENCE OF ULCERS (sore throat, sores in mouth, or a toothache) UNUSUAL RASH, SWELLING OR PAIN  UNUSUAL VAGINAL DISCHARGE OR ITCHING   Items with * indicate a potential emergency and should be followed up as soon as possible or go to the Emergency Department if any problems should occur.  Please show the CHEMOTHERAPY ALERT CARD or IMMUNOTHERAPY ALERT CARD at  check-in to the Emergency Department and triage nurse.  Should you have questions after your visit or need to cancel or reschedule your appointment, please contact [facility]  Dept: [phone]  and follow the prompts.  Office hours are 8:00 a.m. to 4:30 p.m. Monday - Friday. Please note that voicemails left after 4:00 p.m. may not be returned until the following business day.  We are closed weekends and major holidays. You have access to a nurse at all times for urgent questions. Please call the main number to the clinic Dept: [phone] and follow the prompts.   For any non-urgent questions, you may also contact your provider using MyChart. We now offer e-Visits for anyone 18 and older to request care online for non-urgent symptoms. For details visit [email or web address].   Also download the MyChart app! Go to the app store, search "MyChart", open the app, select [facility], and log in with your MyChart username and password.   

## 2022-10-02 NOTE — Progress Notes (Signed)
Per Pamelia Hoit MD, ok to treat with ECHO from 05/20/22 EF 60-65%

## 2022-10-09 ENCOUNTER — Encounter: Payer: Self-pay | Admitting: Family Medicine

## 2022-10-09 ENCOUNTER — Ambulatory Visit (INDEPENDENT_AMBULATORY_CARE_PROVIDER_SITE_OTHER): Payer: 59 | Admitting: Family Medicine

## 2022-10-09 VITALS — BP 148/98 | HR 84 | Temp 98.5°F | Ht 62.0 in | Wt 121.0 lb

## 2022-10-09 DIAGNOSIS — G809 Cerebral palsy, unspecified: Secondary | ICD-10-CM

## 2022-10-09 DIAGNOSIS — E1169 Type 2 diabetes mellitus with other specified complication: Secondary | ICD-10-CM | POA: Diagnosis not present

## 2022-10-09 DIAGNOSIS — E785 Hyperlipidemia, unspecified: Secondary | ICD-10-CM

## 2022-10-09 DIAGNOSIS — Z7984 Long term (current) use of oral hypoglycemic drugs: Secondary | ICD-10-CM | POA: Diagnosis not present

## 2022-10-09 DIAGNOSIS — E1159 Type 2 diabetes mellitus with other circulatory complications: Secondary | ICD-10-CM | POA: Diagnosis not present

## 2022-10-09 DIAGNOSIS — C50411 Malignant neoplasm of upper-outer quadrant of right female breast: Secondary | ICD-10-CM

## 2022-10-09 DIAGNOSIS — E119 Type 2 diabetes mellitus without complications: Secondary | ICD-10-CM | POA: Diagnosis not present

## 2022-10-09 DIAGNOSIS — L304 Erythema intertrigo: Secondary | ICD-10-CM

## 2022-10-09 DIAGNOSIS — I152 Hypertension secondary to endocrine disorders: Secondary | ICD-10-CM

## 2022-10-09 DIAGNOSIS — Z170 Estrogen receptor positive status [ER+]: Secondary | ICD-10-CM | POA: Diagnosis not present

## 2022-10-09 LAB — BAYER DCA HB A1C WAIVED: HB A1C (BAYER DCA - WAIVED): 7.1 % — ABNORMAL HIGH (ref 4.8–5.6)

## 2022-10-09 MED ORDER — LANCETS MISC. MISC
99 refills | Status: AC
Start: 2022-10-09 — End: ?

## 2022-10-09 MED ORDER — LANCET DEVICE MISC
0 refills | Status: DC
Start: 2022-10-09 — End: 2024-01-21

## 2022-10-09 MED ORDER — BLOOD GLUCOSE MONITORING SUPPL DEVI
0 refills | Status: DC
Start: 2022-10-09 — End: 2024-01-21

## 2022-10-09 MED ORDER — LOSARTAN POTASSIUM 50 MG PO TABS: 50.0000 mg | ORAL_TABLET | Freq: Every day | ORAL | 3 refills | Status: DC

## 2022-10-09 MED ORDER — PRAVASTATIN SODIUM 40 MG PO TABS: 40.0000 mg | ORAL_TABLET | Freq: Every day | ORAL | 3 refills | Status: DC

## 2022-10-09 MED ORDER — SITAGLIPTIN PHOSPHATE 50 MG PO TABS
50.0000 mg | ORAL_TABLET | Freq: Every day | ORAL | 3 refills | Status: DC
Start: 2022-10-09 — End: 2023-06-24

## 2022-10-09 MED ORDER — DAPAGLIFLOZIN PROPANEDIOL 10 MG PO TABS: 10.0000 mg | ORAL_TABLET | Freq: Every day | ORAL | 3 refills | Status: DC

## 2022-10-09 MED ORDER — NYSTATIN 100000 UNIT/GM EX CREA
1.0000 | TOPICAL_CREAM | Freq: Two times a day (BID) | CUTANEOUS | 0 refills | Status: DC
Start: 2022-10-09 — End: 2024-02-18

## 2022-10-09 MED ORDER — BLOOD GLUCOSE TEST VI STRP
ORAL_STRIP | 99 refills | Status: DC
Start: 2022-10-09 — End: 2023-12-03

## 2022-10-09 NOTE — Progress Notes (Signed)
Subjective: CC:DM PCP: Serena Croissant, MD [name] is a 57 y.o. female presenting to clinic today for:  1. Type 2 Diabetes with hypertension, hyperlipidemia:  New onset.  Patient has been lost to follow up since 09/2018.  She was diagnosed with breast cancer and is seen by Dr Pamelia Hoit.  Treated with Farxiga, Losartan and pravachol.  She denies any vaginal symptoms.  No dysuria, hematuria or vaginitis.  No chest pain, shortness of breath.  Overall she has been doing really well.  She has not been very amenable to much dietary change.  She loves sweet tea.  Last eye exam: needs Last foot exam: needs Last A1c:  Lab Results  Component Value Date   HGBA1C 7.6 (H) 05/29/2022   Nephropathy screen indicated?: UTD Last flu, zoster and/or pneumovax:  Immunization History  Administered Date(s) Administered   Influenza Inj Mdck Quad Pf 03/04/2017   Influenza,inj,Quad PF,6+ Mos 04/12/2013, 02/06/2015, 02/01/2016, 02/03/2019, 02/10/2020, 02/21/2021, 03/06/2022   Moderna Sars-Covid-2 Vaccination 08/13/2019, 09/23/2019   PFIZER(Purple Top)SARS-COV-2 Vaccination 02/10/2020   Tdap 09/03/2013    2.  Impaired mobility and ADLs secondary to cerebral palsy Patient is accompanied today's visit by her sister, who is her healthcare power of attorney and power of attorney.  She is totally dependent upon her for IADLs and ADLs.  She is asking for evaluation for new electronic wheelchair that is foldable.  She used to have 1 of these.  Her current wheelchair is pretty much patchwork together.  She relies on assistance from her sister for transfers.  3.  Rash Patient reports along the medial chest that is new.  Has been using Vaseline in this area.  No skin breakdown.  ROS: Per HPI  Allergies  Allergen Reactions   Hydrocodone Nausea And Vomiting and Other (See Comments)    Vomiting and hallunications   Past Medical History:  Diagnosis Date   Arthritis    Cerebral palsy (HCC)    Complication of  anesthesia 12/09/2013   Respiratory instability with 10 mg Propofol.Poor gag reflex.   Constipation - functional    Dysphagia 2003   2O to GERD/HH, ?difficulty with large food bolus due to neuromuscular weakness   Epigastric pain MAY 2012 ABD [place] NL GBLIVPAN   2o to GERD V. NON-ULCER DYSPEPSIA   GERD (gastroesophageal reflux disease) 2007   BPE NL ESO, REFLUX   Hiatal hernia    HIATAL HERNIA 11/23/2008   Qualifier: Diagnosis of  By: Diana Eves     HTN (hypertension)    Hyperlipidemia     Current Outpatient Medications:    carvedilol (COREG) 3.125 MG tablet, Take 1 tablet (3.125 mg total) by mouth 2 (two) times daily., Disp: 180 tablet, Rfl: 3   dapagliflozin propanediol (FARXIGA) 10 MG TABS tablet, Take 1 tablet (10 mg total) by mouth daily before breakfast., Disp: 30 tablet, Rfl: 3   diphenoxylate-atropine (LOMOTIL) 2.5-0.025 MG tablet, TAKE 1 TO 2 TABLETS BY MOUTH 4 TIMES A DAY AS NEEDED FOR DIARRHEA, Disp: 40 tablet, Rfl: 1   ibuprofen (ADVIL,MOTRIN) 600 MG tablet, Take 1 tablet (600 mg total) by mouth every 6 (six) hours as needed (for mild pain not relieved by other medications.)., Disp: 30 tablet, Rfl: 0   losartan (COZAAR) 50 MG tablet, Take 1 tablet (50 mg total) by mouth daily., Disp: 90 tablet, Rfl: 3   megestrol (MEGACE) 40 MG tablet, Take 1 tablet (40 mg total) by mouth 2 (two) times daily. Take 3 tablets every day, Disp: 90  tablet, Rfl: 7   Multiple Vitamin (MULTI-VITAMIN) tablet, Take 1 tablet by mouth daily., Disp: , Rfl:    pantoprazole (PROTONIX) 40 MG tablet, TAKE 1 TABLET BY MOUTH 30 MINUTES PRIOR TO BREAKFAST AND SUPPER,, Disp: 180 tablet, Rfl: 3   potassium chloride (KLOR-CON) 10 MEQ tablet, TAKE 1 TABLET BY MOUTH EVERY DAY, Disp: 90 tablet, Rfl: 1   pravastatin (PRAVACHOL) 40 MG tablet, TAKE 1 TABLET BY MOUTH EVERY DAY, Disp: 90 tablet, Rfl: 3   tamoxifen (NOLVADEX) 20 MG tablet, TAKE 1 TABLET BY MOUTH EVERY DAY, Disp: 90 tablet, Rfl: 3 Social History    Socioeconomic History   Marital status: Single    Spouse name: Not on file   Number of children: Not on file   Years of education: Not on file   Highest education level: Not on file  Occupational History   Not on file  Tobacco Use   Smoking status: Never   Smokeless tobacco: Never  Substance and Sexual Activity   Alcohol use: No   Drug use: Yes    Types: PCP   Sexual activity: Not Currently    Birth control/protection: None  Other Topics Concern   Not on file  Social History Narrative   Not on file   Social Determinants of Health   Financial Resource Strain: Not on file  Food Insecurity: Not on file  Transportation Needs: Not on file  Physical Activity: Not on file  Stress: Not on file  Social Connections: Not on file  Intimate Partner Violence: Not on file   Family History  Problem Relation Age of Onset   Colon cancer Mother        LATE 50S    Objective: Office vital signs reviewed. BP (!) 148/98   Pulse 84   Temp 98.5 F (36.9 C)   Ht 5\' 2"  (1.575 m)   Wt 121 lb (54.9 kg)   SpO2 95%   BMI 22.13 kg/m   Physical Examination:  General: Awake, alert, well nourished, No acute distress HEENT: sclera white, MMM Cardio: regular rate and rhythm, S1S2 heard, no murmurs appreciated Pulm: clear to auscultation bilaterally, no wheezes, rhonchi or rales; normal work of breathing on room air MSK: Arrives in wheelchair.  Has atrophy of lower extremity muscles as well as plantarflexion and contracture of bilateral feet Skin: Patchy, hyperpigmented rash noted along the dcolletage Neuro: Speech difficult to understand but the patient is very engaging and understands questions.  Diabetic Foot Exam - Simple   Simple Foot Form Diabetic Foot exam was performed with the following findings: Yes 10/09/2022  1:33 PM  Visual Inspection See comments: Yes Sensation Testing Intact to touch and monofilament testing bilaterally: Yes Pulse Check Posterior Tibialis and Dorsalis  pulse intact bilaterally: Yes Comments Some contracture of the feet noted with downward, plantarflexion     Assessment/ Plan: 57 y.o. female   New onset type 2 diabetes mellitus (HCC) - Plan: Bayer DCA Hb A1c Waived, Blood Glucose Monitoring Suppl DEVI, Glucose Blood (BLOOD GLUCOSE TEST STRIPS) STRP, Lancet Device MISC, Lancets Misc. MISC, dapagliflozin propanediol (FARXIGA) 10 MG TABS tablet, sitaGLIPtin (JANUVIA) 50 MG tablet  Malignant neoplasm of upper-outer quadrant of right breast in female, estrogen receptor positive (HCC)  Hypertension associated with diabetes (HCC) - Plan: losartan (COZAAR) 50 MG tablet  Hyperlipidemia associated with type 2 diabetes mellitus (HCC) - Plan: pravastatin (PRAVACHOL) 40 MG tablet  Infantile cerebral palsy (HCC) - Plan: Ambulatory Referral to Neuro Rehab  Intertrigo - Plan: nystatin cream (  MYCOSTATIN)  A1c not at goal but has dropped some since previous checkup.  A1c at 7.1.  I am adding Januvia 50 mg daily to take with [place].  I considered Colbert Coyer but it looks like her insurance will likely not cover the combo pill.  Whilst I do recommend lifestyle modification I agree that concerning quality of life this would make a big impact on quality and at this point I think that it is worth simply trying to control her more with medications.  New glucose monitor sent.  Great appreciation to Dr. Pamelia Hoit for managing this patient.  Urine microalbumin to be collected at next visit.  Her sister will get a diabetic eye exam performed soon.  Continue blood pressure and blood cholesterol control with current regimen.  BP not at goal but seems to be isolated as I reviewed her last several notes and she looks like she has had controlled blood pressure.  No changes for now.  Monitor closely at home with goal of less than 140/90  Referral to neurorehab for fitting of motorized wheelchair  ?  Intertrigo along medial chest.  Start nystatin.  No orders of the defined types  were placed in this encounter.  No orders of the defined types were placed in this encounter.    [name], DO [facility] [phone]

## 2022-10-09 NOTE — Patient Instructions (Signed)

## 2022-11-06 ENCOUNTER — Ambulatory Visit (INDEPENDENT_AMBULATORY_CARE_PROVIDER_SITE_OTHER): Payer: 59

## 2022-11-06 VITALS — Ht 62.0 in | Wt 124.0 lb

## 2022-11-06 DIAGNOSIS — Z0000 Encounter for general adult medical examination without abnormal findings: Secondary | ICD-10-CM

## 2022-11-06 NOTE — Progress Notes (Signed)
Subjective:   [name] is a 57 y.o. female who presents for Medicare Annual (Subsequent) preventive examination.  Visit Complete: Virtual  I connected with  [name] on 11/06/22 by a audio enabled telemedicine application and verified that I am speaking with the correct person using two identifiers.  Patient Location: Home  Provider Location: Home Office  I discussed the limitations of evaluation and management by telemedicine. The patient expressed understanding and agreed to proceed.  Patient Medicare AWV questionnaire was completed by the patient on 11/06/2022; I have confirmed that all information answered by patient is correct and no changes since this date.  Review of Systems    Nutrition Risk Assessment:  Has the patient had any N/V/D within the last 2 months?  No  Does the patient have any non-healing wounds?  No  Has the patient had any unintentional weight loss or weight gain?  No   Diabetes:  Is the patient diabetic?  Yes  If diabetic, was a CBG obtained today?  No  Did the patient bring in their glucometer from home?  No  How often do you monitor your CBG's? Daily .   Financial Strains and Diabetes Management:  Are you having any financial strains with the device, your supplies or your medication? No .  Does the patient want to be seen by Chronic Care Management for management of their diabetes?  No  Would the patient like to be referred to a Nutritionist or for Diabetic Management?  No   Diabetic Exams:  Diabetic Eye Exam: Overdue for diabetic eye exam. Pt has been advised about the importance in completing this exam. Patient advised to call and schedule an eye exam. Diabetic Foot Exam: Overdue, Pt has been advised about the importance in completing this exam. Pt is scheduled for diabetic foot exam on next office visit .  Cardiac Risk Factors include: advanced age (>51men, >76 women);diabetes mellitus;dyslipidemia;hypertension     Objective:     Today's Vitals   11/06/22 0956  Weight: 124 lb (56.2 kg)  Height: 5\' 2"  (1.575 m)   Body mass index is 22.68 kg/m.     11/06/2022   10:00 AM 07/10/2022    8:39 AM 05/29/2022    9:37 AM 12/12/2021   10:24 AM 09/19/2021    9:47 AM 05/16/2021    9:14 AM 09/06/2020   11:12 AM  Advanced Directives  Does Patient Have a Medical Advance Directive? No No No No No No No  Does patient want to make changes to medical advance directive?  No - Patient declined  No - Patient declined No - Patient declined No - Patient declined   Would patient like information on creating a medical advance directive? No - Patient declined No - Patient declined No - Patient declined No - Patient declined No - Patient declined No - Patient declined     Current Medications (verified) Outpatient Encounter Medications as of 11/06/2022  Medication Sig   Blood Glucose Monitoring Suppl DEVI Check BGs up to twice daily.  E11.9 Wants One TOuch May substitute to any manufacturer covered by patient's insurance.   carvedilol (COREG) 3.125 MG tablet Take 1 tablet (3.125 mg total) by mouth 2 (two) times daily.   dapagliflozin propanediol (FARXIGA) 10 MG TABS tablet Take 1 tablet (10 mg total) by mouth daily before breakfast.   Glucose Blood (BLOOD GLUCOSE TEST STRIPS) STRP Check BGs up to twice daily. E11.9 One touch. May substitute to any manufacturer covered by patient's  insurance.   Lancet Device MISC Check BGs daily as directed. E11.9. May substitute to any manufacturer covered by patient's insurance.   Lancets Misc. MISC Check BGs up to twice daily. E11.9 May substitute to any manufacturer covered by patient's insurance.   losartan (COZAAR) 50 MG tablet Take 1 tablet (50 mg total) by mouth daily.   megestrol (MEGACE) 40 MG tablet Take 1 tablet (40 mg total) by mouth 2 (two) times daily. Take 3 tablets every day   Multiple Vitamin (MULTI-VITAMIN) tablet Take 1 tablet by mouth daily.   nystatin cream (MYCOSTATIN) Apply 1 Application  topically 2 (two) times daily. To area between breast for rash x7-14 days   pantoprazole (PROTONIX) 40 MG tablet TAKE 1 TABLET BY MOUTH 30 MINUTES PRIOR TO BREAKFAST AND SUPPER,   potassium chloride (KLOR-CON) 10 MEQ tablet TAKE 1 TABLET BY MOUTH EVERY DAY   pravastatin (PRAVACHOL) 40 MG tablet Take 1 tablet (40 mg total) by mouth daily.   sitaGLIPtin (JANUVIA) 50 MG tablet Take 1 tablet (50 mg total) by mouth daily. Take with [name] daily for sugar.   tamoxifen (NOLVADEX) 20 MG tablet TAKE 1 TABLET BY MOUTH EVERY DAY   [DISCONTINUED] prochlorperazine (COMPAZINE) 10 MG tablet Take 1 tablet (10 mg total) by mouth every 6 (six) hours as needed (Nausea or vomiting).   No facility-administered encounter medications on file as of 11/06/2022.    Allergies (verified) Hydrocodone   History: Past Medical History:  Diagnosis Date   Arthritis    Cerebral palsy (HCC)    Complication of anesthesia 12/09/2013   Respiratory instability with 10 mg Propofol.Poor gag reflex.   Constipation - functional    Dysphagia 2003   2O to GERD/HH, ?difficulty with large food bolus due to neuromuscular weakness   Epigastric pain MAY 2012 ABD [place] NL GBLIVPAN   2o to GERD V. NON-ULCER DYSPEPSIA   GERD (gastroesophageal reflux disease) 2007   BPE NL ESO, REFLUX   Hiatal hernia    HIATAL HERNIA 11/23/2008   Qualifier: Diagnosis of  By: Diana Eves     HTN (hypertension)    Hyperlipidemia    Past Surgical History:  Procedure Laterality Date   COLONOSCOPY  APR 2009 with proprofol   SLF: SIMPLE ADENOMAS   MASTECTOMY W/ SENTINEL NODE BIOPSY Bilateral 05/01/2018   Procedure: BILATERAL MASTECTOMIES WITH LEFT SENTINEL LYMPH NODE BIOPSIES AND RIGHT AXILLARY LYMPH NODE DISSECTION;  Surgeon: Ovidio Kin, MD;  Location: [facility] OR;  Service: General;  Laterality: Bilateral;   PORTACATH PLACEMENT N/A 05/01/2018   Procedure: INSERTION PORT-A-CATH WITH [place];  Surgeon: Ovidio Kin, MD;  Location: [facility] OR;  Service: General;   Laterality: N/A;   UPPER GASTROINTESTINAL ENDOSCOPY  MAY 2012 DIL 16 mm   SLF: NO DEFINITE STRICTURE APPRECIATED   UPPER GASTROINTESTINAL ENDOSCOPY  FEB 2009-HYPOXIA REQUIRING NARCAN, & VERSED, D50V4   SLF: NL ESO, FUNDIC GLAND POLYPS   UPPER GASTROINTESTINAL ENDOSCOPY  w/ DIL 2003, 2004, 2006   Family History  Problem Relation Age of Onset   Hypertension Mother    Diabetes Mother    Colon cancer Mother        LATE 50S   Hypertension Brother    Diabetes Brother    Diabetes Maternal Grandmother    Diabetes Maternal Grandfather    Hypertension Maternal Grandfather    Social History   Socioeconomic History   Marital status: Single    Spouse name: Not on file   Number of children: Not on file   Years  of education: Not on file   Highest education level: Not on file  Occupational History   Not on file  Tobacco Use   Smoking status: Never   Smokeless tobacco: Never  Substance and Sexual Activity   Alcohol use: No   Drug use: Yes    Types: PCP   Sexual activity: Not Currently    Birth control/protection: None  Other Topics Concern   Not on file  Social History Narrative   Not on file   Social Determinants of Health   Financial Resource Strain: Low Risk  (11/06/2022)   Overall Financial Resource Strain (CARDIA)    Difficulty of Paying Living Expenses: Not hard at all  Food Insecurity: No Food Insecurity (11/06/2022)   Hunger Vital Sign    Worried About Running Out of Food in the Last Year: Never true    Ran Out of Food in the Last Year: Never true  Transportation Needs: No Transportation Needs (11/06/2022)   PRAPARE - Administrator, Civil Service (Medical): No    Lack of Transportation (Non-Medical): No  Physical Activity: Insufficiently Active (11/06/2022)   Exercise Vital Sign    Days of Exercise per Week: 3 days    Minutes of Exercise per Session: 30 min  Stress: No Stress Concern Present (11/06/2022)   [employer] of Occupational Health -  Occupational Stress Questionnaire    Feeling of Stress : Only a little  Social Connections: Moderately Isolated (11/06/2022)   Social Connection and Isolation Panel [NHANES]    Frequency of Communication with Friends and Family: More than three times a week    Frequency of Social Gatherings with Friends and Family: More than three times a week    Attends Religious Services: 1 to 4 times per year    Active Member of [employer] or Organizations: No    Attends Engineer, structural: Never    Marital Status: Never married    Tobacco Counseling Counseling given: Not Answered   Clinical Intake:  Pre-visit preparation completed: Yes  Pain : No/denies pain     Nutritional Risks: None Diabetes: No  How often do you need to have someone help you when you read instructions, pamphlets, or other written materials from your doctor or pharmacy?: 1 - Never  Interpreter Needed?: No  Information entered by :: Renie Ora, LPN   Activities of Daily Living    11/06/2022   10:00 AM  In your present state of health, do you have any difficulty performing the following activities:  Hearing? 0  Vision? 0  Difficulty concentrating or making decisions? 0  Walking or climbing stairs? 0  Dressing or bathing? 0  Doing errands, shopping? 0  Preparing Food and eating ? N  Using the Toilet? N  In the past six months, have you accidently leaked urine? N  Do you have problems with loss of bowel control? N  Managing your Medications? N  Managing your Finances? N  Housekeeping or managing your Housekeeping? N    Patient Care Team: [name], DO as PCP - General (Family Medicine) Maisie Fus, MD as PCP - Cardiology (Cardiology) Serena Croissant, MD as Consulting Physician (Hematology and Oncology)  Indicate any recent Medical Services you may have received from other than [facility] providers in the past year (date may be approximate).     Assessment:   This is a routine wellness  examination for Kanchan.  Hearing/Vision screen Vision Screening - Comments:: Wears rx glasses - up to  date with routine eye exams with  [facility]   Dietary issues and exercise activities discussed:     Goals Addressed             This Visit's Progress    DIET - INCREASE WATER INTAKE         Depression Screen    11/06/2022    9:59 AM 02/09/2018    8:06 AM 12/25/2017    5:10 PM 10/07/2017    8:10 AM 06/09/2017    8:11 AM 01/21/2017    8:06 AM 12/24/2016    8:29 AM  PHQ 2/9 Scores  PHQ - 2 Score 1 0 0 0 0 0 0    Fall Risk    11/06/2022    9:57 AM 02/09/2018    8:06 AM 12/25/2017    5:10 PM 10/07/2017    8:10 AM 06/09/2017    8:11 AM  Fall Risk   Falls in the past year? 0 No No No No  Number falls in past yr: 0      Injury with Fall? 0      Risk for fall due to : No Fall Risks      Follow up Falls prevention discussed        MEDICARE RISK AT HOME:  Medicare Risk at Home - 11/06/22 0957     Any stairs in or around the home? No    If so, are there any without handrails? No    Home free of loose throw rugs in walkways, pet beds, electrical cords, etc? Yes    Adequate lighting in your home to reduce risk of falls? Yes    Life alert? No    Use of a cane, walker or w/c? Yes   wheel chair   Grab bars in the bathroom? Yes    Shower chair or bench in shower? Yes    Elevated toilet seat or a handicapped toilet? Yes             TIMED UP AND GO:  Was the test performed?  No    Cognitive Function:        11/06/2022   10:00 AM  6CIT Screen  What Year? 0 points  What month? 0 points  What time? 0 points  Count back from 20 0 points  Months in reverse 0 points  Repeat phrase 0 points  Total Score 0 points    Immunizations Immunization History  Administered Date(s) Administered   Influenza Inj Mdck Quad Pf 03/04/2017   Influenza,inj,Quad PF,6+ Mos 04/12/2013, 02/06/2015, 02/01/2016, 02/03/2019, 02/10/2020, 02/21/2021, 03/06/2022   Moderna  Sars-Covid-2 Vaccination 08/13/2019, 09/23/2019   PFIZER(Purple Top)SARS-COV-2 Vaccination 02/10/2020   Tdap 09/03/2013    TDAP status: Up to date  Flu Vaccine status: Up to date  Pneumococcal vaccine status: Due, Education has been provided regarding the importance of this vaccine. Advised may receive this vaccine at local pharmacy or Health Dept. Aware to provide a copy of the vaccination record if obtained from local pharmacy or Health Dept. Verbalized acceptance and understanding.  Covid-19 vaccine status: Completed vaccines  Qualifies for Shingles Vaccine? Yes   Zostavax completed No   Shingrix Completed?: No.    Education has been provided regarding the importance of this vaccine. Patient has been advised to call insurance company to determine out of pocket expense if they have not yet received this vaccine. Advised may also receive vaccine at local pharmacy or Health Dept. Verbalized acceptance and understanding.  Screening Tests Health  Maintenance  Topic Date Due   Diabetic kidney evaluation - Urine ACR  Never done   PAP SMEAR-Modifier  06/19/2020   COVID-19 Vaccine (4 - 2023-24 season) 01/11/2022   Zoster Vaccines- Shingrix (1 of 2) 01/09/2023 (Originally 06/22/1984)   MAMMOGRAM  10/09/2023 (Originally 11/05/2017)   INFLUENZA VACCINE  12/12/2022   DTaP/Tdap/Td (2 - Td or Tdap) 09/04/2023   Diabetic kidney evaluation - eGFR measurement  10/02/2023   Medicare Annual Wellness (AWV)  11/06/2023   Colonoscopy  10/31/2024   Hepatitis C Screening  Completed   HIV Screening  Completed   HPV VACCINES  Aged Out    Health Maintenance  Health Maintenance Due  Topic Date Due   Diabetic kidney evaluation - Urine ACR  Never done   PAP SMEAR-Modifier  06/19/2020   COVID-19 Vaccine (4 - 2023-24 season) 01/11/2022    Colorectal cancer screening: Type of screening: Colonoscopy. Completed 11/01/2019. Repeat every 5 years  Mammogram status: No longer required due to mastectomy  currently in treatment for breat cancer .  Bone Density status: Ordered not of age . Pt provided with contact info and advised to call to schedule appt.  Lung Cancer Screening: (Low Dose CT Chest recommended if Age 80-80 years, 20 pack-year currently smoking OR have quit w/in 15years.) does not qualify.   Lung Cancer Screening Referral: n/a  Additional Screening:  Hepatitis C Screening: does not qualify; Completed 12/26/2014  Vision Screening: Recommended annual ophthalmology exams for early detection of glaucoma and other disorders of the eye. Is the patient up to date with their annual eye exam?  Yes  Who is the provider or what is the name of the office in which the patient attends annual eye exams? Dr.Groat  If pt is not established with a provider, would they like to be referred to a provider to establish care? No .   Dental Screening: Recommended annual dental exams for proper oral hygiene  Diabetic Foot Exam: Diabetic Foot Exam: Overdue, Pt has been advised about the importance in completing this exam. Pt is scheduled for diabetic foot exam on next office visit .  Community Resource Referral / Chronic Care Management: CRR required this visit?  No   CCM required this visit?  No     Plan:     I have personally reviewed and noted the following in the patient's chart:   Medical and social history Use of alcohol, tobacco or illicit drugs  Current medications and supplements including opioid prescriptions. Patient is not currently taking opioid prescriptions. Functional ability and status Nutritional status Physical activity Advanced directives List of other physicians Hospitalizations, surgeries, and ER visits in previous 12 months Vitals Screenings to include cognitive, depression, and falls Referrals and appointments  In addition, I have reviewed and discussed with patient certain preventive protocols, quality metrics, and best practice recommendations. A written  personalized care plan for preventive services as well as general preventive health recommendations were provided to patient.     Lorrene Reid, LPN   [id number]   After Visit Summary: (MyChart) Due to this being a telephonic visit, the after visit summary with patients personalized plan was offered to patient via MyChart   Nurse Notes: Caregiver Sister Ashari Llewellyn assisted with MWV

## 2022-11-06 NOTE — Patient Instructions (Signed)
[name] , Thank you for taking time to come for your Medicare Wellness Visit. I appreciate your ongoing commitment to your health goals. Please review the following plan we discussed and let me know if I can assist you in the future.   These are the goals we discussed:  Goals      DIET - INCREASE WATER INTAKE        This is a list of the screening recommended for you and due dates:  Health Maintenance  Topic Date Due   Yearly kidney health urinalysis for diabetes  Never done   Pap Smear  06/19/2020   COVID-19 Vaccine (4 - 2023-24 season) 01/11/2022   Zoster (Shingles) Vaccine (1 of 2) 01/09/2023*   Mammogram  10/09/2023*   Flu Shot  12/12/2022   DTaP/Tdap/Td vaccine (2 - Td or Tdap) 09/04/2023   Yearly kidney function blood test for diabetes  10/02/2023   Medicare Annual Wellness Visit  11/06/2023   Colon Cancer Screening  10/31/2024   Hepatitis C Screening  Completed   HIV Screening  Completed   HPV Vaccine  Aged Out  *Topic was postponed. The date shown is not the original due date.    Advanced directives: Advance directive discussed with you today. I have provided a copy for you to complete at home and have notarized. Once this is complete please bring a copy in to our office so we can scan it into your chart.  Information on Advanced Care Planning can be found at [facility] Advance Health Care Directives Advance Health Care Directives ([email or web address]    Conditions/risks identified: Aim for 30 minutes of exercise or brisk walking, 6-8 glasses of water, and 5 servings of fruits and vegetables each day.   Next appointment: Follow up in one year for your annual wellness visit.   Preventive Care 40-64 Years, Female Preventive care refers to lifestyle choices and visits with your health care provider that can promote health and wellness. What does preventive care include? A yearly physical exam. This is also called an annual well check. Dental exams once or  twice a year. Routine eye exams. Ask your health care provider how often you should have your eyes checked. Personal lifestyle choices, including: Daily care of your teeth and gums. Regular physical activity. Eating a healthy diet. Avoiding tobacco and drug use. Limiting alcohol use. Practicing safe sex. Taking low-dose aspirin daily starting at age 46. Taking vitamin and mineral supplements as recommended by your health care provider. What happens during an annual well check? The services and screenings done by your health care provider during your annual well check will depend on your age, overall health, lifestyle risk factors, and family history of disease. Counseling  Your health care provider may ask you questions about your: Alcohol use. Tobacco use. Drug use. Emotional well-being. Home and relationship well-being. Sexual activity. Eating habits. Work and work [employer]. Method of birth control. Menstrual cycle. Pregnancy history. Screening  You may have the following tests or measurements: Height, weight, and BMI. Blood pressure. Lipid and cholesterol levels. These may be checked every 5 years, or more frequently if you are over 66 years old. Skin check. Lung cancer screening. You may have this screening every year starting at age 52 if you have a 30-pack-year history of smoking and currently smoke or have quit within the past 15 years. Fecal occult blood test (FOBT) of the stool. You may have this test every year starting at age 84. Flexible  sigmoidoscopy or colonoscopy. You may have a sigmoidoscopy every 5 years or a colonoscopy every 10 years starting at age 34. Hepatitis C blood test. Hepatitis B blood test. Sexually transmitted disease (STD) testing. Diabetes screening. This is done by checking your blood sugar (glucose) after you have not eaten for a while (fasting). You may have this done every 1-3 years. Mammogram. This may be done every 1-2 years. Talk to  your health care provider about when you should start having regular mammograms. This may depend on whether you have a family history of breast cancer. BRCA-related cancer screening. This may be done if you have a family history of breast, ovarian, tubal, or peritoneal cancers. Pelvic exam and Pap test. This may be done every 3 years starting at age 74. Starting at age 70, this may be done every 5 years if you have a Pap test in combination with an HPV test. Bone density scan. This is done to screen for osteoporosis. You may have this scan if you are at high risk for osteoporosis. Discuss your test results, treatment options, and if necessary, the need for more tests with your health care provider. Vaccines  Your health care provider may recommend certain vaccines, such as: Influenza vaccine. This is recommended every year. Tetanus, diphtheria, and acellular pertussis (Tdap, Td) vaccine. You may need a Td booster every 10 years. Zoster vaccine. You may need this after age 33. Pneumococcal 13-valent conjugate (PCV13) vaccine. You may need this if you have certain conditions and were not previously vaccinated. Pneumococcal polysaccharide (PPSV23) vaccine. You may need one or two doses if you smoke cigarettes or if you have certain conditions. Talk to your health care provider about which screenings and vaccines you need and how often you need them. This information is not intended to replace advice given to you by your health care provider. Make sure you discuss any questions you have with your health care provider. Document Released: 05/26/2015 Document Revised: 01/17/2016 Document Reviewed: 02/28/2015 Elsevier Interactive Patient Education  2017 [employer].    Fall Prevention in the Home Falls can cause injuries. They can happen to people of all ages. There are many things you can do to make your home safe and to help prevent falls. What can I do on the outside of my home? Regularly fix the  edges of walkways and driveways and fix any cracks. Remove anything that might make you trip as you walk through a door, such as a raised step or threshold. Trim any bushes or trees on the path to your home. Use bright outdoor lighting. Clear any walking paths of anything that might make someone trip, such as rocks or tools. Regularly check to see if handrails are loose or broken. Make sure that both sides of any steps have handrails. Any raised decks and porches should have guardrails on the edges. Have any leaves, snow, or ice cleared regularly. Use sand or salt on walking paths during winter. Clean up any spills in your garage right away. This includes oil or grease spills. What can I do in the bathroom? Use night lights. Install grab bars by the toilet and in the tub and shower. Do not use towel bars as grab bars. Use non-skid mats or decals in the tub or shower. If you need to sit down in the shower, use a plastic, non-slip stool. Keep the floor dry. Clean up any water that spills on the floor as soon as it happens. Remove soap buildup in the  tub or shower regularly. Attach bath mats securely with double-sided non-slip rug tape. Do not have throw rugs and other things on the floor that can make you trip. What can I do in the bedroom? Use night lights. Make sure that you have a light by your bed that is easy to reach. Do not use any sheets or blankets that are too big for your bed. They should not hang down onto the floor. Have a firm chair that has side arms. You can use this for support while you get dressed. Do not have throw rugs and other things on the floor that can make you trip. What can I do in the kitchen? Clean up any spills right away. Avoid walking on wet floors. Keep items that you use a lot in easy-to-reach places. If you need to reach something above you, use a strong step stool that has a grab bar. Keep electrical cords out of the way. Do not use floor polish or  wax that makes floors slippery. If you must use wax, use non-skid floor wax. Do not have throw rugs and other things on the floor that can make you trip. What can I do with my stairs? Do not leave any items on the stairs. Make sure that there are handrails on both sides of the stairs and use them. Fix handrails that are broken or loose. Make sure that handrails are as long as the stairways. Check any carpeting to make sure that it is firmly attached to the stairs. Fix any carpet that is loose or worn. Avoid having throw rugs at the top or bottom of the stairs. If you do have throw rugs, attach them to the floor with carpet tape. Make sure that you have a light switch at the top of the stairs and the bottom of the stairs. If you do not have them, ask someone to add them for you. What else can I do to help prevent falls? Wear shoes that: Do not have high heels. Have rubber bottoms. Are comfortable and fit you well. Are closed at the toe. Do not wear sandals. If you use a stepladder: Make sure that it is fully opened. Do not climb a closed stepladder. Make sure that both sides of the stepladder are locked into place. Ask someone to hold it for you, if possible. Clearly mark and make sure that you can see: Any grab bars or handrails. First and last steps. Where the edge of each step is. Use tools that help you move around (mobility aids) if they are needed. These include: Canes. Walkers. Scooters. Crutches. Turn on the lights when you go into a dark area. Replace any light bulbs as soon as they burn out. Set up your furniture so you have a clear path. Avoid moving your furniture around. If any of your floors are uneven, fix them. If there are any pets around you, be aware of where they are. Review your medicines with your doctor. Some medicines can make you feel dizzy. This can increase your chance of falling. Ask your doctor what other things that you can do to help prevent falls. This  information is not intended to replace advice given to you by your health care provider. Make sure you discuss any questions you have with your health care provider. Document Released: 02/23/2009 Document Revised: 10/05/2015 Document Reviewed: 06/03/2014 Elsevier Interactive Patient Education  2017 [employer].

## 2022-11-09 ENCOUNTER — Other Ambulatory Visit: Payer: Self-pay

## 2022-11-11 ENCOUNTER — Encounter: Payer: Self-pay | Admitting: Hematology and Oncology

## 2022-11-13 ENCOUNTER — Other Ambulatory Visit: Payer: Self-pay

## 2022-11-13 ENCOUNTER — Inpatient Hospital Stay: Payer: 59 | Attending: Hematology and Oncology

## 2022-11-13 VITALS — BP 112/76 | HR 82 | Temp 97.9°F | Resp 17

## 2022-11-13 DIAGNOSIS — Z79811 Long term (current) use of aromatase inhibitors: Secondary | ICD-10-CM | POA: Insufficient documentation

## 2022-11-13 DIAGNOSIS — Z5112 Encounter for antineoplastic immunotherapy: Secondary | ICD-10-CM | POA: Insufficient documentation

## 2022-11-13 DIAGNOSIS — Z170 Estrogen receptor positive status [ER+]: Secondary | ICD-10-CM | POA: Insufficient documentation

## 2022-11-13 DIAGNOSIS — Z9013 Acquired absence of bilateral breasts and nipples: Secondary | ICD-10-CM | POA: Insufficient documentation

## 2022-11-13 DIAGNOSIS — C50411 Malignant neoplasm of upper-outer quadrant of right female breast: Secondary | ICD-10-CM | POA: Insufficient documentation

## 2022-11-13 DIAGNOSIS — C7951 Secondary malignant neoplasm of bone: Secondary | ICD-10-CM | POA: Insufficient documentation

## 2022-11-13 MED ORDER — TRASTUZUMAB-ANNS CHEMO 150 MG IV SOLR
6.0000 mg/kg | Freq: Once | INTRAVENOUS | Status: AC
  Administered 2022-11-13: 357 mg via INTRAVENOUS
  Filled 2022-11-13: qty 17

## 2022-11-13 MED ORDER — HEPARIN SOD (PORK) LOCK FLUSH 100 UNIT/ML IV SOLN
500.0000 [IU] | Freq: Once | INTRAVENOUS | Status: AC | PRN
  Administered 2022-11-13: 500 [IU]

## 2022-11-13 MED ORDER — SODIUM CHLORIDE 0.9% FLUSH
10.0000 mL | INTRAVENOUS | Status: DC | PRN
  Administered 2022-11-13: 10 mL

## 2022-11-13 MED ORDER — ACETAMINOPHEN 325 MG PO TABS
650.0000 mg | ORAL_TABLET | Freq: Once | ORAL | Status: AC
  Administered 2022-11-13: 650 mg via ORAL
  Filled 2022-11-13: qty 2

## 2022-11-13 MED ORDER — DIPHENHYDRAMINE HCL 25 MG PO CAPS
50.0000 mg | ORAL_CAPSULE | Freq: Once | ORAL | Status: AC
  Administered 2022-11-13: 50 mg via ORAL
  Filled 2022-11-13: qty 2

## 2022-11-13 MED ORDER — SODIUM CHLORIDE 0.9 % IV SOLN: Freq: Once | INTRAVENOUS | Status: AC

## 2022-12-04 ENCOUNTER — Ambulatory Visit: Payer: 59 | Attending: Family Medicine | Admitting: Physical Therapy

## 2022-12-04 DIAGNOSIS — R2681 Unsteadiness on feet: Secondary | ICD-10-CM | POA: Diagnosis not present

## 2022-12-04 DIAGNOSIS — R278 Other lack of coordination: Secondary | ICD-10-CM | POA: Diagnosis not present

## 2022-12-04 DIAGNOSIS — M6281 Muscle weakness (generalized): Secondary | ICD-10-CM | POA: Diagnosis not present

## 2022-12-04 NOTE — Therapy (Signed)
OUTPATIENT PHYSICAL THERAPY WHEELCHAIR EVALUATION   Patient Name: [name] MRN: [id number] DOB:31-Aug-1965, 57 y.o., female Today's Date: 12/04/2022  END OF SESSION:  PT End of Session - 12/04/22 1008     Visit Number 1    Number of Visits 1    Date for PT Re-Evaluation 12/04/22    Authorization Type [employer] Medicare    PT Start Time 1006    PT Stop Time 1046    PT Time Calculation (min) 40 min    Activity Tolerance Patient tolerated treatment well    Behavior During Therapy WFL for tasks assessed/performed             Past Medical History:  Diagnosis Date   Arthritis    Cerebral palsy (HCC)    Complication of anesthesia 12/09/2013   Respiratory instability with 10 mg Propofol.Poor gag reflex.   Constipation - functional    Dysphagia 2003   2O to GERD/HH, ?difficulty with large food bolus due to neuromuscular weakness   Epigastric pain MAY 2012 ABD [place] NL GBLIVPAN   2o to GERD V. NON-ULCER DYSPEPSIA   GERD (gastroesophageal reflux disease) 2007   BPE NL ESO, REFLUX   Hiatal hernia    HIATAL HERNIA 11/23/2008   Qualifier: Diagnosis of  By: Diana Eves     HTN (hypertension)    Hyperlipidemia    Past Surgical History:  Procedure Laterality Date   COLONOSCOPY  APR 2009 with proprofol   SLF: SIMPLE ADENOMAS   MASTECTOMY W/ SENTINEL NODE BIOPSY Bilateral 05/01/2018   Procedure: BILATERAL MASTECTOMIES WITH LEFT SENTINEL LYMPH NODE BIOPSIES AND RIGHT AXILLARY LYMPH NODE DISSECTION;  Surgeon: Ovidio Kin, MD;  Location: [facility] OR;  Service: General;  Laterality: Bilateral;   PORTACATH PLACEMENT N/A 05/01/2018   Procedure: INSERTION PORT-A-CATH WITH [place];  Surgeon: Ovidio Kin, MD;  Location: [facility] OR;  Service: General;  Laterality: N/A;   UPPER GASTROINTESTINAL ENDOSCOPY  MAY 2012 DIL 16 mm   SLF: NO DEFINITE STRICTURE APPRECIATED   UPPER GASTROINTESTINAL ENDOSCOPY  FEB 2009-HYPOXIA REQUIRING NARCAN, & VERSED, D50V4   SLF: NL ESO, FUNDIC GLAND POLYPS   UPPER  GASTROINTESTINAL ENDOSCOPY  w/ DIL 2003, 2004, 2006   Patient Active Problem List   Diagnosis Date Noted   Goals of care, counseling/discussion 09/14/2018   Port-A-Cath in place 06/17/2018   Malignant neoplasm metastatic to bone (HCC) 06/04/2018   Breast cancer, stage 2, right (HCC) 05/01/2018   Malignant neoplasm of upper-outer quadrant of right breast in female, estrogen receptor positive (HCC) 03/23/2018   Pre-diabetes 12/24/2016   Healthcare maintenance 12/26/2014   Headache 12/26/2014   Frequency of micturition 09/03/2013   Need for Tdap vaccination 09/03/2013   Pain in limb 04/12/2013   Wart viral 04/12/2013   HLD (hyperlipidemia) 09/16/2012   Vitamin D deficiency 09/16/2012   Hyperglycemia 09/16/2012   HTN (hypertension) 09/16/2012   ADENOMATOUS COLONIC POLYP 11/21/2009   DENTAL PAIN 07/11/2009   Infantile cerebral palsy (HCC) 11/23/2008   GERD 11/23/2008   Constipation 11/23/2008   Dysphagia 11/23/2008    PCP: [name], DO  REFERRING PROVIDER: [name], DO  THERAPY DIAG:  Muscle weakness (generalized)  Other lack of coordination  Unsteadiness on feet  Rationale for Evaluation and Treatment Habilitation  SUBJECTIVE:  SUBJECTIVE STATEMENT: Pt presents for a power wheelchair evaluation.  Pt is accompanied by her sister and caregiver Evette. Pt's sister reports that the patient last received a power wheelchair in about 2009 and the chair no longer is in working condition. She has been utilizing a manual wheelchair for dependent transportation for the past few years.  PRECAUTIONS: Fall  RED FLAGS: None   WEIGHT BEARING RESTRICTIONS No    OCCUPATION: not currently working  PLOF:  Needs assistance with ADLs and Needs assistance with transfers  PATIENT  GOALS: To obtain a new power wheelchair to increase independence with mobility in the home      MEDICAL HISTORY:  Primary diagnosis onset: at birth (September 06, 1965)     Medical Diagnosis with ICD-10 code: Infantile cerebral palsy (HCC) G80.9     [] Progressive disease  Relevant future surgeries: N/A    Height: 5'2" Weight: 121 lbs Explain recent changes or trends in weight:  N/A    History:  Past Medical History:  Diagnosis Date   Arthritis    Cerebral palsy (HCC)    Complication of anesthesia 12/09/2013   Respiratory instability with 10 mg Propofol.Poor gag reflex.   Constipation - functional    Dysphagia 2003   2O to GERD/HH, ?difficulty with large food bolus due to neuromuscular weakness   Epigastric pain MAY 2012 ABD [place] NL GBLIVPAN   2o to GERD V. NON-ULCER DYSPEPSIA   GERD (gastroesophageal reflux disease) 2007   BPE NL ESO, REFLUX   Hiatal hernia    HIATAL HERNIA 11/23/2008   Qualifier: Diagnosis of  By: Diana Eves     HTN (hypertension)    Hyperlipidemia        Cardio Status:  Functional Limitations:   [x] Intact  []  Impaired      Respiratory Status:  Functional Limitations:   [x] Intact  [] Impaired   [] SOB [] COPD [] O2 Dependent ______LPM  [] Ventilator Dependent  Resp equip:                                                     Objective Measure(s):   Orthotics:   [] Amputee:                                                             [] Prosthesis:        HOME ENVIRONMENT:  [x] House [] Condo/town home [] Apartment [] Asst living [] LTCF         [x] Own  [] Rent   [] Lives alone [x] Lives with others -            sister and brother                 Hours without assistance: 0  [x] Home is accessible to patient                             Storage of wheelchair:  [x] In home   [] Other Comments: ramped entrance to the home       COMMUNITY :  TRANSPORTATION:  [] Car [] Van [x] Public Transportation [] Adapted w/c Lift []  Ambulance [] Other:                     [  x]Sits in  wheelchair during transport   Where is w/c stored during transport? Pt would need to sit in power wheelchair during transport [x] [employer]  []  [employer]  r   [] Self-Driver       Drive while in  Biomedical scientist [] yes [x] no   Employment and/or school: N/A Specific requirements pertaining to mobility        Other:  COMMUNICATION:  Verbal Communication  [] WFL [] receptive [] WFL [] expressive [] Understandable  [x] Difficult to understand  [] non-communicative  Primary Language:___English_________ 2nd:_____________  Communication provided by:[x] Patient [x] Family [] Caregiver [] Translator   [] Uses an augmentative communication device     Manufacturer/Model :                                                                MOBILITY/BALANCE:  Sitting Balance  Standing Balance  Transfers  Ambulation   [x] WFL      [] WFL  [] Independent  []  Independent   [] Uses UE for balance in sitting Comments:  [] Uses UE/device for stability Comments:  []  Min assist  []  Ambulates independently with       device:___________________      []  Mod assist  []  Able to ambulate ______ feet        safely/functionally/independently   []  Min assist  []  Min assist  []  Max assist  []  Non-functional ambulator         History/High risk of falls   []  Mod assist  []  Mod assist  [x]  Dependent  [x]  Unable to ambulate   []  Max  assist  []  Max assist  Transfer method:[] 1 person [] 2 person [] sliding board [] squat pivot [] stand pivot [] mechanical patient lift  [x] other: dependent lift from caregiver  []  Unable  [x]  Unable    Fall History: # of falls in the past 6 months? 0 # of "near" falls in the past 6 months? 0    CURRENT SEATING / MOBILITY:  Current Mobility Device: [] None [] Cane/Walker [x] Manual [] Dependent [] Dependent w/ Tilt rScooter  [] Power (type of control):   Manufacturer:  Model:  Serial #:   Size:  Color:  Age: unknown  Purchased by whom: by family member at [employer]  Current condition of mobility base:  current power chair  does not charge and no longer works per family, device not present during evaluation; current manual chair is too large for patient and patient is unable to propel herself in this device independently  Current seating system:                     [id number] manual wheelchair                                                  Age of seating system:  unknown, purchased at [employer]  Describe posture in present seating system: flexed head and neck, flexed trunk, thoracic kyphosis, posterior pelvic tilt   Is the current mobility meeting medical necessity?:  [] Yes [x] No Describe: Patient's current power wheelchair is unusable as it no longer charges. This device is 38-29 years old per family report and patient is not able to utilize this device. Pt arrives to  clinic seated in a manual wheelchair that she relies on dependent assistance for mobility in. Due to her diagnosis of CP and limited UE strength and mobility she is unable to safely propel herself in a manual wheelchair. The patient needs a new power wheelchair for safe and independent mobility.                                    Ability to complete Mobility-Related Activities of Daily Living (MRADL's) with Current Mobility Device:   Move room to room  [] Independent  [] Min [] Mod [] Max assist  [x] Unable  Comments: patient is dependent for all ADLs and is dependent for mobility at a manual wheelchair level  Meal prep  [] Independent  [] Min [] Mod [] Max assist  [x] Unable    Feeding  [] Independent  [] Min [] Mod [] Max assist  [x] Unable    Bathing  [] Independent  [] Min [] Mod [] Max assist  [x] Unable    Grooming  [] Independent  [] Min [] Mod [] Max assist  [x] Unable    UE dressing  [] Independent  [] Min [] Mod [] Max assist  [x] Unable    LE dressing  [] Independent   [] Min [] Mod [] Max assist  [x] Unable    Toileting  [] Independent  [] Min [] Mod [] Max assist  [x] Unable    Bowel Mgt: [x]  Continent []  Incontinent []  Accidents []  Diapers []  Colostomy []  Bowel Program:   Bladder Mgt: [x]  Continent []  Incontinent []  Accidents []  Diapers []  Urinal []  Intermittent Cath []  Indwelling Cath []  Supra-pubic Cath     Current Mobility Equipment Trialed/ Ruled Out:    Does not meet mobility needs due to:    Mark all boxes that indicate inability to use the specific equipment listed     Meets needs for safe  independent functional  ambulation  / mobility    Risk of  Falling or History of Falls    Enviromental limitations      Cognition    Safety concerns with  physical ability    Decreased / limitations endurance  & strength     Decreased / limitations  motor skills  & coordination    Pain    Pace /  Speed    Cardiac and/or  respiratory condition    Contra - indicated by diagnosis   Cane/Crutches  []   [x]   []   []   [x]   [x]   [x]   []   [x]   []   [x]    Walker / Rollator  []  NA   []   [x]   []   []   [x]   [x]   [x]   []   [x]   []   [x]     Manual Wheelchair [id number]:  []  NA  []   []   []   []   [x]   [x]   [x]   []   [x]   []   []    Manual W/C (K0005) with power assist  []  NA  []   []   []   []   [x]   [x]   [x]   []   [x]   []   []    Scooter  []  NA  []   []   []   []   [x]   [x]   [x]   []   []   []   []    Power Wheelchair: standard joystick  []  NA  [x]   []   []   []   []   []   []   []   []   []   []    Power Wheelchair: alternative controls  [x]  NA  []   []   []   []   []   []   []   []   []   []   []   Summary:  The least costly alternative for independent functional mobility was found to be:    []  Crutch/Cane  []  Walker []  Manual w/c  []  Manual w/c with power assist   []  Scooter   [x]  Power w/c std joystick   []  Power w/c alternative control        []  Requires dependent care mobility device   Cabin crew for [employer] skills are adequate for safe mobility equipment operation  [x]   Yes []   No  Patient is willing and motivated to use recommended mobility equipment  [x]   Yes []   No       []  Patient is unable to safely operate mobility equipment independently and requires  dependent care equipment Comments:           SENSATION and SKIN ISSUES:  Sensation [x]  Intact  []  Impaired []  Absent []  Hyposensate []  Hypersensate  []  Defensiveness  Location(s) of impairment:    Pressure Relief Method(s):  []  Lean side to side to offload (without risk of falling)  []   W/C push up (4+ times/hour for 15+ seconds) []  Stand up (without risk of falling)    []  Other: (Describe): Effective pressure relief method(s) above can be performed consistently throughout the day: [] Yes  [x]  No If not, Why?: pt unable to stand, lean side to side, or perform a wheelchair pushup; she requires tilt feature on wheelchair for pressure relief and for repositioning  Skin Integrity Risk:       []  Low risk           []  Moderate risk            [x]  High risk  If high risk, explain: She is high risk for skin breakdown as she is unable to perform pressure relief independently with her current device of a manual wheelchair  Skin Issues/Skin Integrity  Current skin Issues  []  Yes [x]  No []  Intact  []   Red area   []   Open area  []  Scar tissue  []  At risk from prolonged sitting  Where: History of Skin Issues  []  Yes [x]  No Where : When: Stage: Hx of skin flap surgeries  []  Yes [x]  No Where:  When:  Pain: []  Yes [x]  No   Pain Location(s):  Intensity scale: (0-10) : How does pain interfere with mobility and/or MRADLs? -         MAT EVALUATION:  Neuro-Muscular Status: (Tone, Reflexive, Responses, etc.)     []   Intact   [x]  Spasticity:  []  Hypotonicity  []  Fluctuating  []  Muscle Spasms  []  Poor Righting Reactions/Poor Equilibrium Reactions  []  Primal Reflex(s):    Comments:            COMMENTS:    POSTURE:     Comments:  Pelvis Anterior/Posterior:  []  Neutral   [x]  Posterior  []  Anterior  []  Fixed - No movement [x]  Tendency away from neutral []  Flexible []  Self-correction []  External correction Obliquity (viewed from front)  [x]  WFL []  R Obliquity []  L  Obliquity  []  Fixed - No movement []  Tendency away from neutral []  Flexible []  Self-correction []  External correction Rotation  [x]  WFL []  R anterior []  L anterior  []  Fixed - No movement []  Tendency away from neutral []  Flexible []  Self-correction []  External correction Tonal Influence Pelvis:  []  Normal []  Flaccid []  Low tone [x]  Spasticity []  Dystonia []  Pelvis thrust []  Other:    Trunk Anterior/Posterior:  []  WFL [  x] Thoracic kyphosis []  Lumbar lordosis  []  Fixed - No movement [x]  Tendency away from neutral []  Flexible []  Self-correction []  External correction  [x]  WFL []  Convex to left  []  Convex to right []  S-curve   []  C-curve []  Multiple curves []  Tendency away from neutral []  Flexible []  Self-correction []  External correction Rotation of shoulders and upper trunk:  []  Neutral []  Left-anterior [x]  Right- anterior []  Fixed- no movement []  Tendency away from neutral []  Flexible []  Self correction []  External correction Tonal influence Trunk:  []  Normal []  Flaccid []  Low tone [x]  Spasticity []  Dystonia []  Other:   Head & Neck  []  Functional [x]  Flexed    []  Extended []  Rotated right  []  Rotated left []  Laterally flexed right []  Laterally flexed left []  Cervical hyperextension   []  Good head control [x]  Adequate head control []  Limited head control []  Absent head control Describe tone/movement of head and neck: ataxic movements at times     Lower Extremity Measurements: LE ROM:  Active ROM Right 12/04/2022 Left 12/04/2022  Hip flexion decreased decreased  Hip extension    Hip abduction    Hip adduction    Knee flexion [facility]  Knee extension hyperextension hyperextension  Ankle dorsiflexion PF contracture PF contracture  Ankle plantarflexion     (Blank rows = not tested)  LE MMT:  MMT Right 12/04/2022 Left 12/04/2022  Hip flexion 4 4  Hip extension    Hip abduction    Hip adduction    Knee flexion 4 4  Knee  extension 4 4  Ankle dorsiflexion 0 0  Ankle plantarflexion     (Blank rows = not tested)  Hip positions:  [x]  Neutral   []  Abducted   []  Adducted  []  Subluxed   []  Dislocated   []  Fixed   []  Tendency away from neutral []  Flexible []  Self-correction []  External correction   Hip Windswept:[x]  Neutral  []  Right    []  Left  []  Subluxed   []  Dislocated   []  Fixed   []  Tendency away from neutral []  Flexible []  Self-correction []  External correction  LE Tone: []  Normal []  Low tone [x]  Spasticity []  Flaccid []  Dystonia []  Rocks/Extends at hip [x]  Thrust into knee extension []  Pushes legs downward into footrest  Foot positioning: ROM Concerns: Dorsiflexed: []  Right   []  Left Plantar flexed: [x]  Right    [x]  Left Inversion: []  Right    []  Left Eversion: []  Right    []  Left  LE Edema: []  1+ (Barely detectable impression when finger is pressed into skin) []  2+ (slight indentation. 15 seconds to rebound) []  3+ (deeper indentation. 30 seconds to rebound) []  4+ (>30 seconds to rebound)  UE Measurements:  UPPER EXTREMITY ROM:   Active ROM Right 12/04/2022 Left 12/04/2022  Shoulder flexion Limited to about 90 degrees Limited to about 90 degrees  Shoulder abduction Limited to about 90 degrees Limited to about 90 degrees  Shoulder adduction    Elbow flexion [facility]  Elbow extension [facility]  Wrist flexion increased increased  Wrist extension Decreased Decreased  (Blank rows = not tested)  UPPER EXTREMITY MMT:  MMT Right 12/04/2022 Left 12/04/2022  Shoulder flexion 3 3  Shoulder abduction 3 3  Shoulder adduction    Elbow flexion 3 3  Elbow extension 3 3  Wrist flexion    Wrist extension    Pinch strength    Grip strength Slightly impaired Slightly impaired  (Blank rows =  not tested)  Shoulder Posture:  Right Tendency towards Left  []   Functional []    [x]   Elevation [x]    []   Depression []    [x]   Protraction [x]    []   Retraction []    []   Internal rotation []     []   External rotation []    []   Subluxed []     UE Tone: []  Normal []  Flaccid []  Low tone [x]  Spasticity  []  Dystonia [x]  Other: ataxic movements of BUE  UE Edema: []  1+ (Barely detectable impression when finger is pressed into skin) []  2+ (slight indentation. 15 seconds to rebound) []  3+ (deeper indentation. 30 seconds to rebound) []  4+ (>30 seconds to rebound)  Wrist/Hand: Handedness: [x]  Right   []  Left   []  NA: Comments:  Right  Left  []   WNL []    []   Limitations []    []   Contractures []    []   Fisting []    [x]   Tremors [x]    []   Weak grasp []    [x]   Poor dexterity [x]    []   Hand movement non functional []    []   Paralysis []         MOBILITY BASE RECOMMENDATIONS and JUSTIFICATION:  MOBILITY BASE  JUSTIFICATION   Manufacturer:   Pride Mobility Model:    J4                          Color:  Seat Width:  16" Seat Depth: 16"    []  Manual mobility base (continue below)   []  Scooter/POV  [x]  Power mobility base   Number of hours per day spent in above selected mobility base: 16 hours  Typical daily mobility base use Schedule: Patient will utilize her power wheelchair to perform all of her functional mobility in the home. It will allow her to travel between rooms of her home safely and efficiently in order to complete all of her MRADLs. Pt is able to perform dependent lift transfers with caregivers dependently lifting her to transfer . Due to impaired endurance, decreased UE strength and ROM, and diagnosis of CP she is also unable to safely and efficiently propel herself in a manual wheelchair.    [x]  is not a safe, functional ambulator  []  limitation prevents from completing a MRADL(s) within a reasonable time frame    []  limitation places at high risk of morbidity or mortality secondary to  the attempts to perform a    MRADL(s)  [x]  limitation prevents accomplishing a MRADL(s) entirely  [x]  provide independent mobility  [x]  equipment is a lifetime medical need  [x]   walker or cane inadequate  [x]  any type manual wheelchair      inadequate  [x]  scooter/POV inadequate      []  requires dependent mobility             POWER MOBILITY      []  Scooter/POV    []  can safely operate   []  can safely transfer   []  has adequate trunk stability   []  cannot functionally propel  manual wheelchair    [x]  Power mobility base    [x]  non-ambulatory   [x]  cannot functionally propel manual wheelchair   [x]  cannot functionally and safely      operate scooter/POV  [x]  can safely operate power       wheelchair  [x]  home is accessible  [x]  willing to use power wheelchair     Tilt  [x]  Powered tilt on powered  chair  []  Powered tilt on manual chair  []  Manual tilt on manual chair Comments:  [x]  change position for pressure      [x]  elief/cannot weight shift   [x]  change position against      gravitational force on head and      shoulders   [x]  decrease pain  []  blood pressure management   []  control autonomic dysreflexia  []  decrease respiratory distress  [x]  management of spasticity  []  management of low tone  [x]  facilitate postural control   [x]  rest periods   []  control edema  [x]  increase sitting tolerance   [x]  aid with transfers     Recline   []  Power recline on power chair  []  Manual recline on manual chair  Comments:    []  intermittent catheterization  []  manage spasticity  []  accommodate femur to back angle  []  change position for pressure relief/cannot weight shift rhigh risk of pressure sore development  []  tilt alone does not accomplish     effective pressure relief, maximum pressure relief achieved at -      _______ degrees tilt   _______ degrees recline   []  difficult to transfer to and from bed []  rest periods and sleeping in chair  []  repositioning for transfers  []  bring to full recline for ADL care  []  clothing/diaper changes in chair  []  gravity PEG tube feeding  []  head positioning  []  decrease pain  []  blood pressure  management   []  control autonomic dysreflexia  []  decrease respiratory distress  []  user on ventilator     Elevator on mobility base  []  Power wheelchair  []  Scooter  []  increase Indep in transfers   []  increase Indep in ADLs    []  bathroom function and safety  []  kitchen/cooking function and safety  []  shopping  []  raise height for communication at standing level  []  raise height for eye contact which reduces cervical neck strain and pain  []  drive at raised height for safety and navigating crowds  []  Other:   []  Vertical position system  (anterior tilt)     (Drive locks-out)    []  Stand       (Drive enabled)  []  independent weight bearing  []  decrease joint contractures  []  decrease/manage spasticity  []  decrease/manage spasms  []  pressure distribution away from   scapula, sacrum, coccyx, and ischial tuberosity  []  increase digestion and elimination   []  access to counters and cabinets  []  increase reach  []  increase interaction with others at eye level, reduces neck strain  []  increase performance of       MRADL(s)      Power elevating legrest    []  Center mount (Single) 85-170 degrees       []  Standard (Pair) 100-170 degrees  []  position legs at 90 degrees, not available with std power ELR  []  center mount tucks into chair to decrease turning radius in home, not available with std power ELR  []  provide change in position for LE  []  elevate legs during recline    []  maintain placement of feet on      footplate  []  decrease edema  []  improve circulation  []  actuator needed to elevate legrest  []  actuator needed to articulate legrest preventing knees from flexing  []  Increase ground clearance over      curbs  []   STD (pair) independently  elevate legrest   POWER WHEELCHAIR CONTROLS      Controls/input device  []  Expandable  [x]  Non-expandable  [x]  Proportional  [x]  Right Hand []  Left Hand  []  Non-proportional/switches/head-array  []   Electrical/proximity         []   Mechanical      Manufacturer:___________________   Type:________________________ [x]  provides access for controlling wheelchair  [x]  programming for accurate control  []  progressive disease/changing condition  []  required for alternative drive      controls       []  lacks motor control to operate  proportional drive control  []  unable to understand proportional controls  []  limited movement/strength  [x]  extraneous movement / tremors / ataxic / spastic       [x]  Upgraded electronics controller/harness    [x]  Single power (tilt or recline)   []  Expandable    [x]  Non-expandable plus   []  Multi-power (tilt, recline, power legrest, power seat lift, vertical positioning system, stand)  [x]  allows input device to communicate with drive motors  [x]  harness provides necessary connections between the controller, input device, and seat functions     [x]  needed in order to operate power seat functions through joystick/ input device  []  required for alternative drive controls     []  Enhanced display  []  required to connect all alternative drive controls   []  required for upgraded joystick      (lite-throw, heavy duty, micro)  []  Allows user to see in which mode and drive the wheelchair is set; necessary for alternate controls       []  Upgraded tracking electronics  []  correct tracking when on uneven surfaces makes switch driving more efficient and less fatiguing  []  increase safety when driving  []  increase ability to traverse thresholds    []  Safety / reset / mode switches     Type:    []  Used to change modes and stop the wheelchair when driving     [x]  Mount for joystick / input device/switches  [x]  swing away for access or transfers   [x]  attaches joystick / input device / switches to wheelchair   [x]  provides for consistent access  []  midline for optimal placement    []  Attendant controlled joystick plus     mount  []  safety  []  long distance driving   []  operation of seat functions  []  compliance with transportation regulations    [x]  Battery  [x]  required to power (power assist / scooter/ power wc / other):   []  Power inverter (24V to 12V)  []  required for ventilator / respiratory equipment / other:     CHAIR OPTIONS MANUAL & POWER      Armrests   [x]  adjustable height []  removable  []  swing away []  fixed  [x]  flip back  []  reclining  [x]  full length pads []  desk []  tube arms []  gel pads  [x]  provide support with elbow at 90    [x]  remove/flip back/swing away for  transfers  [x]  provide support and positioning of upper body    []  allow to come closer to table top  [x]  remove for access to tables  []  provide support for w/c tray  [x]  change of height/angles for variable activities   []  Elbow support / Elbow stop  []  keep elbow positioned on arm pad  []  keep arms from falling off arm pad  during tilt and/or recline   Upper Extremity Support  []  Arm trough  []   R  []   L  Style:  []  swivel mount []  fixed mount   []  posterior hand support  []   tray  []  full tray  []  joystick cut out  []   R  []   L  Style:  []  decrease gravitational pull on      shoulders  []  provide support to increase UE  function  []  provide hand support in natural    position  []  position flaccid UE  []  decrease subluxation    []  decrease edema       []  manage spasticity   []  provide midline positioning  []  provide work surface  []  placement for AAC/ Computer/ EADL       Hangers/ Legrests   []  ______ degree  []  Elevating []  articulating  []  swing away []  fixed []  lift off  []  heavy duty  []  adjustable knee angle  []  adjustable calf panel   []  longer extension tube              []  provide LE support  []  maintain placement of feet on      footplate   []  accommodate lower leg length  []  accommodate to hamstring       tightness  []  enable transfers  []  provide change in position for LE's  []  elevate legs during recline    []  decrease edema  []   durability      Foot support   [x]  footplate []  R []  L [x]  flip up           []  Depth adjustable   [x]  angle adjustable  []  foot board/one piece    [x]  provide foot support  [x]  accommodate to ankle ROM  []  allow foot to go under wheelchair base  [x]  enable transfers     []  Shoe holders  []  position foot    []  decrease / manage spasticity  []  control position of LE  []  stability    []  safety     []  Ankle strap/heel      loops  []  support foot on foot support  []  decrease extraneous movement  []  provide input to heel   []  protect foot     []  Amputee adapter []  R  []  L     Style:                  Size:  []  Provide support for stump/residual extremity    []  Transportation tie-down  []  to provide crash tested tie-down brackets    []  Crutch/cane holder    []  O2 holder    []  IV hanger   []  Ventilator tray/mount    []  stabilize accessory on wheelchair       Component  Justification     [x]  Seat cushion      Zen SP []  accommodate impaired sensation  []  decubitus ulcers present or history  [x]  unable to shift weight  [x]  increase pressure distribution  [x]  prevent pelvic extension  [x]  custom required "off-the-shelf"    seat cushion will not accommodate deformity  [x]  stabilize/promote pelvis alignment  [x]  stabilize/promote femur alignment  []  accommodate obliquity  []  accommodate multiple deformity  []  incontinent/accidents  [x]  low maintenance     []  seat mounts                 []  fixed []  removable  []  attach seat platform/cushion to wheelchair frame    []  Seat wedge    []  provide increased aggressiveness of seat  shape to decrease sliding  down in the seat  []  accommodate ROM        []  Cover replacement   []  protect back or seat cushion  []  incontinent/accidents    []  Solid seat / insert    []  support cushion to prevent      hammocking  []  allows attachment of cushion to mobility base    []  Lateral pelvic/thigh/hip     support (Guides)     []  decrease abduction  []   accommodate pelvis  []  position upper legs  []  accommodate spasticity  []  removable for transfers     []  Lateral pelvic/thigh      supports mounts  []  fixed   []  swing-away   []  removable  []  mounts lateral pelvic/thigh supports     []  mounts lateral pelvic/thigh supports swing-away or removable for transfers    []  Medial thigh support (Pommel)  [] decrease adduction  [] accommodate ROM  []  remove for transfers   []  alignment      []  Medial thigh   []  fixed      support mounts      []  swing-away   []  removable  []  mounts medial thigh supports   []  Mounts medial supports swing- away or removable for transfers       Component  Justification   []  Back       []  provide posterior trunk support []  facilitate tone  []  provide lumbar/sacral support []  accommodate deformity  []  support trunk in midline   []  custom required "off-the-shelf" back support will not accommodate deformity   []  provide lateral trunk support []  accommodate or decrease tone            []  Back mounts  []  fixed  []  removable  []  attach back rest/cushion to wheelchair frame   []  Lateral trunk      supports  []  R []  L  []  decrease lateral trunk leaning  []  accommodate asymmetry    []  contour for increased contact  []  safety    []  control of tone    []  Lateral trunk      supports mounts  []  fixed  []  swing-away   []  removable  []  mounts lateral trunk supports     []  Mounts lateral trunk supports swing-away or removable for transfers   []  Anterior chest      strap, vest     []  decrease forward movement of shoulder  []  decrease forward movement of trunk  []  safety/stability  []  added abdominal support  []  trunk alignment  []  assistance with shoulder control   []  decrease shoulder elevation    [x]  Headrest      [x]  provide posterior head support  []  provide posterior neck support  []  provide lateral head support  []  provide anterior head support  [x]  support during tilt and recline  []  improve feeding     []   improve respiration  []  placement of switches  [x]  safety    [x]  accommodate ROM   [x]  accommodate tone  []  improve visual orientation   [x]  Headrest           []  fixed [x]  removable []  flip down      Mounting hardware   []  swing-away laterals/switches  [x]  mount headrest   [x]  mounts headrest flip down or  removable for transfers  []  mount headrest swing-away laterals   []  mount switches     []  Neck Support    []  decrease  neck rotation  []  decrease forward neck flexion   Pelvic Positioner    [x]  std hip belt          []  padded hip belt  []  dual pull hip belt  []  four point hip belt  []  stabilize tone  [x]  decrease falling out of chair  []  prevent excessive extension  []  special pull angle to control      rotation  [x]  pad for protection over boney   prominence  [x]  promote comfort    []  Essential needs        bag/pouch   []  medicines []  special food rorthotics []  clothing changes  []  diapers  []  catheter/hygiene []  ostomy supplies   The above equipment has a life- long use expectancy.  Growth and changes in medical and/or functional conditions would be the exceptions.   SUMMARY:  Why mobility device was selected; include why a lower level device is not appropriate: Patient requires use of a power wheelchair in order to perform safe and independent mobility in her home and in order to perform her MRADLs in a timely manner. Due to her impaired endurance and history of CP resulting in impaired UE strength and ROM she is unable to safely and efficiently propel herself in a manual wheelchair without putting significant strain on her shoulder joints and putting herself at risk for injury. Additionally, due to her history of CP she is unable to stand or to ambulate, therefore requiring power wheeled mobility.  She requires this specific model of chair as it allows for dampening of controls which is needed with the UE tremors and ataxia that she experiences.  ASSESSMENT:  CLINICAL  IMPRESSION: Patient is a 57 y.o. female who was seen today for physical therapy evaluation and treatment for a new power wheelchair.    OBJECTIVE IMPAIRMENTS decreased balance, decreased cognition, decreased coordination, decreased endurance, decreased mobility, difficulty walking, decreased ROM, decreased strength, increased muscle spasms, impaired tone, impaired UE functional use, improper body mechanics, and postural dysfunction.   ACTIVITY LIMITATIONS carrying, lifting, bending, standing, stairs, transfers, bathing, toileting, dressing, and hygiene/grooming  PARTICIPATION LIMITATIONS: meal prep, cleaning, medication management, community activity, and occupation   CLINICAL DECISION MAKING: Stable/uncomplicated  EVALUATION COMPLEXITY: High                                   GOALS: One time visit. No goals established.    PLAN: PT FREQUENCY: one time visit     Peter Congo, PT, DPT, CSRS 12/04/2022, 10:46 AM    I concur with the above findings and recommendations of the therapist:  Physician name printed:         Physician's signature:      Date:

## 2022-12-10 ENCOUNTER — Ambulatory Visit (HOSPITAL_COMMUNITY): Payer: 59 | Attending: Internal Medicine

## 2022-12-10 DIAGNOSIS — Z170 Estrogen receptor positive status [ER+]: Secondary | ICD-10-CM | POA: Diagnosis not present

## 2022-12-10 DIAGNOSIS — C50411 Malignant neoplasm of upper-outer quadrant of right female breast: Secondary | ICD-10-CM | POA: Insufficient documentation

## 2022-12-10 DIAGNOSIS — G809 Cerebral palsy, unspecified: Secondary | ICD-10-CM | POA: Insufficient documentation

## 2022-12-10 DIAGNOSIS — E119 Type 2 diabetes mellitus without complications: Secondary | ICD-10-CM | POA: Insufficient documentation

## 2022-12-10 DIAGNOSIS — I10 Essential (primary) hypertension: Secondary | ICD-10-CM | POA: Diagnosis not present

## 2022-12-10 DIAGNOSIS — Z9221 Personal history of antineoplastic chemotherapy: Secondary | ICD-10-CM | POA: Diagnosis not present

## 2022-12-10 DIAGNOSIS — E785 Hyperlipidemia, unspecified: Secondary | ICD-10-CM | POA: Insufficient documentation

## 2022-12-10 LAB — ECHOCARDIOGRAM LIMITED
Area-P 1/2: 2.88 cm2
S' Lateral: 2.7 cm

## 2022-12-23 ENCOUNTER — Ambulatory Visit (HOSPITAL_COMMUNITY)
Admission: RE | Admit: 2022-12-23 | Discharge: 2022-12-23 | Disposition: A | Discharge status: Home / Self Care | Payer: 59 | Source: Ambulatory Visit | Attending: Hematology and Oncology | Admitting: Hematology and Oncology

## 2022-12-23 DIAGNOSIS — Z170 Estrogen receptor positive status [ER+]: Secondary | ICD-10-CM | POA: Insufficient documentation

## 2022-12-23 DIAGNOSIS — C7951 Secondary malignant neoplasm of bone: Secondary | ICD-10-CM | POA: Diagnosis not present

## 2022-12-23 DIAGNOSIS — C50919 Malignant neoplasm of unspecified site of unspecified female breast: Secondary | ICD-10-CM | POA: Diagnosis not present

## 2022-12-23 DIAGNOSIS — C50411 Malignant neoplasm of upper-outer quadrant of right female breast: Secondary | ICD-10-CM | POA: Diagnosis not present

## 2022-12-23 MED ORDER — IOHEXOL 300 MG/ML  SOLN
100.0000 mL | Freq: Once | INTRAMUSCULAR | Status: AC | PRN
  Administered 2022-12-23: 100 mL via INTRAVENOUS

## 2022-12-24 NOTE — Progress Notes (Signed)
Patient Care Team: [name], DO as PCP - General (Family Medicine) Maisie Fus, MD as PCP - Cardiology (Cardiology) Serena Croissant, MD as Consulting Physician (Hematology and Oncology)  DIAGNOSIS: No diagnosis found.  SUMMARY OF ONCOLOGIC HISTORY: Oncology History  Malignant neoplasm of upper-outer quadrant of right breast in female, estrogen receptor positive (HCC)  03/18/2018 Initial Diagnosis   Screening detected bilateral breast abnormalities.  Right breast mass UOQ 2.1 cm at 10 o'clock position: Grade 1 ILC ER 70%, PR 0%, HER-2 +3+ by IHC, Ki-67 5%, suspected lymph node in the axilla could not be biopsied; left breast calcifications by ultrasound not visible but 5 cm cystic/tubular structure which on biopsy was fibrocystic change with ADHl; T2N0 stage IIa clinical stage   05/01/2018 Surgery   Bilateral mastectomies: Left mastectomy: DCIS intermediate grade 0.9 cm margins negative Tis NX stage 0; right mastectomy: ILC, grade 3, 3.5 cm, with LCIS, perineural invasion present, margins negative, 15/22 lymph nodes positive with extracapsular extension, ER 90%, PR 0%, HER-2 +(IHC 3+), Ki-67 5%, T2N3 Stage 3B    05/11/2018 Cancer Staging   Staging form: Breast, AJCC 8th Edition - Pathologic stage from 05/11/2018: Stage IIIB (pT2, pN3, cM0, G3, ER+, PR-, HER2+) - Signed by Serena Croissant, MD on 05/11/2018   05/2018 -  Anti-estrogen oral therapy   Tamoxifen daily   06/03/2018 Imaging   Bone scan: Increased tracer uptake in the lumbar spine at L1, L3 and L4, right fourth and seventh ribs, inferior right scapula and distal sternum, T7 and T11 as well.   CT CAP showed widespread bone metastases with possible pathologic fracture at L3, peripheral right upper lobe nodules nonspecific   06/10/2018 -  Chemotherapy   Taxol Kingenti and Perjeta    10/27/2018 PET scan   Bone metastases less metabolically active.  Couple of lymph nodes with low-level metabolic activity noted.    07/10/2022 - 07/10/2022 Chemotherapy   Patient is on Treatment Plan : BREAST Trastuzumab IV (8/6) or SQ (600) D1 q21d     08/21/2022 -  Chemotherapy   Patient is on Treatment Plan : BREAST MAINTENANCE Trastuzumab IV (6) or SQ (600) D1 q21d x 13 cycles     Malignant neoplasm metastatic to bone (HCC)  06/04/2018 Initial Diagnosis   Malignant neoplasm metastatic to bone (HCC)   07/10/2022 - 07/10/2022 Chemotherapy   Patient is on Treatment Plan : BREAST Trastuzumab IV (8/6) or SQ (600) D1 q21d       CHIEF COMPLIANT: Follow-up of metastatic breast cancer on herceptin     INTERVAL HISTORY: [name] is a 57 y.o. with above-mentioned history of metastatic breast cancer currently on treatment with maintenance Kingenti and on anti-estrogen therapy with anastrozole. She presents to the clinic today for a follow-up.     ALLERGIES:  is allergic to hydrocodone.  MEDICATIONS:  Current Outpatient Medications  Medication Sig Dispense Refill   Blood Glucose Monitoring Suppl DEVI Check BGs up to twice daily.  E11.9 Wants One TOuch May substitute to any manufacturer covered by patient's insurance. 1 each 0   carvedilol (COREG) 3.125 MG tablet Take 1 tablet (3.125 mg total) by mouth 2 (two) times daily. 180 tablet 3   dapagliflozin propanediol (FARXIGA) 10 MG TABS tablet Take 1 tablet (10 mg total) by mouth daily before breakfast. 90 tablet 3   Glucose Blood (BLOOD GLUCOSE TEST STRIPS) STRP Check BGs up to twice daily. E11.9 One touch. May substitute to any manufacturer covered by patient's insurance. 100  strip PRN   Lancet Device MISC Check BGs daily as directed. E11.9. May substitute to any manufacturer covered by patient's insurance. 1 each 0   Lancets Misc. MISC Check BGs up to twice daily. E11.9 May substitute to any manufacturer covered by patient's insurance. 100 each PRN   losartan (COZAAR) 50 MG tablet Take 1 tablet (50 mg total) by mouth daily. 90 tablet 3   megestrol (MEGACE) 40 MG tablet  Take 1 tablet (40 mg total) by mouth 2 (two) times daily. Take 3 tablets every day 90 tablet 7   Multiple Vitamin (MULTI-VITAMIN) tablet Take 1 tablet by mouth daily.     nystatin cream (MYCOSTATIN) Apply 1 Application topically 2 (two) times daily. To area between breast for rash x7-14 days 30 g 0   pantoprazole (PROTONIX) 40 MG tablet TAKE 1 TABLET BY MOUTH 30 MINUTES PRIOR TO BREAKFAST AND SUPPER, 180 tablet 3   potassium chloride (KLOR-CON) 10 MEQ tablet TAKE 1 TABLET BY MOUTH EVERY DAY 90 tablet 1   pravastatin (PRAVACHOL) 40 MG tablet Take 1 tablet (40 mg total) by mouth daily. 90 tablet 3   sitaGLIPtin (JANUVIA) 50 MG tablet Take 1 tablet (50 mg total) by mouth daily. Take with [name] daily for sugar. 90 tablet 3   tamoxifen (NOLVADEX) 20 MG tablet TAKE 1 TABLET BY MOUTH EVERY DAY 90 tablet 3   No current facility-administered medications for this visit.    PHYSICAL EXAMINATION: ECOG PERFORMANCE STATUS: {CHL ONC ECOG [phone]}  There were no vitals filed for this visit. There were no vitals filed for this visit.  BREAST:*** No palpable masses or nodules in either right or left breasts. No palpable axillary supraclavicular or infraclavicular adenopathy no breast tenderness or nipple discharge. (exam performed in the presence of a chaperone)  LABORATORY DATA:  I have reviewed the data as listed    Latest Ref Rng & Units 10/02/2022    8:13 AM 07/10/2022    8:14 AM 06/25/2022   10:47 AM  CMP  Glucose 70 - 99 mg/dL 784  696  295   BUN 6 - 20 mg/dL 16  17  13    Creatinine 0.44 - 1.00 mg/dL 2.84  1.32  4.40   Sodium 135 - 145 mmol/L 140  140  146   Potassium 3.5 - 5.1 mmol/L 3.8  3.6  4.1   Chloride 98 - 111 mmol/L 108  108  108   CO2 22 - 32 mmol/L 25  25  22    Calcium 8.9 - 10.3 mg/dL 8.9  8.5  9.7   Total Protein 6.5 - 8.1 g/dL 7.4  7.0    Total Bilirubin 0.3 - 1.2 mg/dL 0.3  0.3    Alkaline Phos 38 - 126 U/L 50  48    AST 15 - 41 U/L 13  11    ALT 0 - 44 U/L 11  10       Lab Results  Component Value Date   WBC 8.1 10/02/2022   HGB 12.3 10/02/2022   HCT 37.1 10/02/2022   MCV 92.5 10/02/2022   PLT 265 10/02/2022   NEUTROABS 5.2 10/02/2022    ASSESSMENT & PLAN:  No problem-specific Assessment & Plan notes found for this encounter.    No orders of the defined types were placed in this encounter.  The patient has a good understanding of the overall plan. she agrees with it. she will call with any problems that may develop before the next visit here. Total  time spent: 30 mins including face to face time and time spent for planning, charting and co-ordination of care   Sherlyn Lick, CMA 12/24/22    I Janan Ridge am acting as a Neurosurgeon for [employer]  ***

## 2022-12-25 ENCOUNTER — Inpatient Hospital Stay (HOSPITAL_BASED_OUTPATIENT_CLINIC_OR_DEPARTMENT_OTHER): Payer: 59 | Admitting: Hematology and Oncology

## 2022-12-25 ENCOUNTER — Other Ambulatory Visit: Payer: Self-pay

## 2022-12-25 ENCOUNTER — Inpatient Hospital Stay: Payer: 59 | Attending: Hematology and Oncology

## 2022-12-25 VITALS — BP 135/83 | HR 85 | Temp 97.7°F | Resp 18 | Ht 62.0 in | Wt 116.2 lb

## 2022-12-25 VITALS — BP 136/66 | HR 87

## 2022-12-25 DIAGNOSIS — Z9013 Acquired absence of bilateral breasts and nipples: Secondary | ICD-10-CM | POA: Diagnosis not present

## 2022-12-25 DIAGNOSIS — Z170 Estrogen receptor positive status [ER+]: Secondary | ICD-10-CM | POA: Insufficient documentation

## 2022-12-25 DIAGNOSIS — Z5112 Encounter for antineoplastic immunotherapy: Secondary | ICD-10-CM | POA: Diagnosis not present

## 2022-12-25 DIAGNOSIS — C50411 Malignant neoplasm of upper-outer quadrant of right female breast: Secondary | ICD-10-CM | POA: Diagnosis not present

## 2022-12-25 DIAGNOSIS — E119 Type 2 diabetes mellitus without complications: Secondary | ICD-10-CM | POA: Diagnosis not present

## 2022-12-25 DIAGNOSIS — C7951 Secondary malignant neoplasm of bone: Secondary | ICD-10-CM | POA: Insufficient documentation

## 2022-12-25 DIAGNOSIS — R918 Other nonspecific abnormal finding of lung field: Secondary | ICD-10-CM | POA: Diagnosis not present

## 2022-12-25 DIAGNOSIS — G809 Cerebral palsy, unspecified: Secondary | ICD-10-CM | POA: Insufficient documentation

## 2022-12-25 MED ORDER — SODIUM CHLORIDE 0.9 % IV SOLN: Freq: Once | INTRAVENOUS | Status: AC

## 2022-12-25 MED ORDER — HEPARIN SOD (PORK) LOCK FLUSH 100 UNIT/ML IV SOLN
500.0000 [IU] | Freq: Once | INTRAVENOUS | Status: AC | PRN
  Administered 2022-12-25: 500 [IU]

## 2022-12-25 MED ORDER — SODIUM CHLORIDE 0.9% FLUSH
10.0000 mL | INTRAVENOUS | Status: DC | PRN
  Administered 2022-12-25: 10 mL

## 2022-12-25 MED ORDER — ACETAMINOPHEN 325 MG PO TABS
650.0000 mg | ORAL_TABLET | Freq: Once | ORAL | Status: AC
  Administered 2022-12-25: 650 mg via ORAL
  Filled 2022-12-25: qty 2

## 2022-12-25 MED ORDER — DIPHENHYDRAMINE HCL 25 MG PO CAPS
50.0000 mg | ORAL_CAPSULE | Freq: Once | ORAL | Status: AC
  Administered 2022-12-25: 50 mg via ORAL
  Filled 2022-12-25: qty 2

## 2022-12-25 MED ORDER — TRASTUZUMAB-ANNS CHEMO 150 MG IV SOLR
6.0000 mg/kg | Freq: Once | INTRAVENOUS | Status: AC
  Administered 2022-12-25: 357 mg via INTRAVENOUS
  Filled 2022-12-25: qty 17

## 2022-12-25 NOTE — Patient Instructions (Signed)
[facility]  Discharge Instructions: Thank you for choosing [facility] to provide your oncology and hematology care.   If you have a lab appointment with the Cancer Center, please go directly to the Cancer Center and check in at the registration area.   Wear comfortable clothing and clothing appropriate for easy access to any Portacath or PICC line.   We strive to give you quality time with your provider. You may need to reschedule your appointment if you arrive late (15 or more minutes).  Arriving late affects you and other patients whose appointments are after yours.  Also, if you miss three or more appointments without notifying the office, you may be dismissed from the clinic at the provider's discretion.      For prescription refill requests, have your pharmacy contact our office and allow 72 hours for refills to be completed.    Today you received the following chemotherapy and/or immunotherapy agents: trastuzumab-anns      To help prevent nausea and vomiting after your treatment, we encourage you to take your nausea medication as directed.  BELOW ARE SYMPTOMS THAT SHOULD BE REPORTED IMMEDIATELY: *FEVER GREATER THAN 100.4 F (38 C) OR HIGHER *CHILLS OR SWEATING *NAUSEA AND VOMITING THAT IS NOT CONTROLLED WITH YOUR NAUSEA MEDICATION *UNUSUAL SHORTNESS OF BREATH *UNUSUAL BRUISING OR BLEEDING *URINARY PROBLEMS (pain or burning when urinating, or frequent urination) *BOWEL PROBLEMS (unusual diarrhea, constipation, pain near the anus) TENDERNESS IN MOUTH AND THROAT WITH OR WITHOUT PRESENCE OF ULCERS (sore throat, sores in mouth, or a toothache) UNUSUAL RASH, SWELLING OR PAIN  UNUSUAL VAGINAL DISCHARGE OR ITCHING   Items with * indicate a potential emergency and should be followed up as soon as possible or go to the Emergency Department if any problems should occur.  Please show the CHEMOTHERAPY ALERT CARD or IMMUNOTHERAPY ALERT CARD  at check-in to the Emergency Department and triage nurse.  Should you have questions after your visit or need to cancel or reschedule your appointment, please contact [facility]  Dept: [phone]  and follow the prompts.  Office hours are 8:00 a.m. to 4:30 p.m. Monday - Friday. Please note that voicemails left after 4:00 p.m. may not be returned until the following business day.  We are closed weekends and major holidays. You have access to a nurse at all times for urgent questions. Please call the main number to the clinic Dept: [phone] and follow the prompts.   For any non-urgent questions, you may also contact your provider using MyChart. We now offer e-Visits for anyone 18 and older to request care online for non-urgent symptoms. For details visit [email or web address].   Also download the MyChart app! Go to the app store, search "MyChart", open the app, select [facility], and log in with your MyChart username and password.   

## 2022-12-25 NOTE — Assessment & Plan Note (Signed)
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   CT CAP showed widespread bone metastases with possible pathologic fracture at L3, peripheral right upper lobe nodules nonspecific Patient has cerebral palsy and her sister is the power of attorney for healthcare Treatment plan: Palliative treatment with Taxol x12 cycles completed 08/26/2018, Kingenti, Perjeta ------------------------------------------------------------------------------------------------------------------------ PET CT scan: 10/27/2018: Widespread bone metastatic disease demonstrates reduced with sclerosis and low metabolic activity with SUV of 3-4.  Left level 4 lymph node 0.5 cm and left deep parotid lymph node SUV 4.1 will need follow-up.  Uterine leiomyomas.   Current treatment: Kingenti maintenance along with anastrozole She is enjoying time with her nephew. Anastrozole toxicities: Denies any hot flashes or myalgias. Kingenti toxicities: None     PET/CT 11/11/2019: Continued mild hypermetabolic activity left supraclavicular lymph node 0.7 cm, old sclerotic bone lesions are stable and not hypermetabolic.  Uterine fibroids   CT CAP 05/27/2022: Similarly treated bone metastases.  Pulmonary nodules felt to be similar CT CAP 12/23/2022: Stable findings without any new or progressive disease.  Stable treated bone metastases.  Stable lung nodules favored to be benign.  Uterine fibroid   Diabetes: Patient is taking measures to decrease her sugar intake.  However she likes her sweet  tea. Return to clinic every 6 weeks for Kingenti  I will see her back in 3 months with scans before visit.

## 2022-12-26 ENCOUNTER — Telehealth: Payer: Self-pay | Admitting: Hematology and Oncology

## 2022-12-26 NOTE — Telephone Encounter (Signed)
Scheduled appointments per WQ. Left voicemail with the appointment details.

## 2023-01-06 ENCOUNTER — Other Ambulatory Visit: Payer: Self-pay | Admitting: Family Medicine

## 2023-01-06 DIAGNOSIS — G809 Cerebral palsy, unspecified: Secondary | ICD-10-CM

## 2023-01-16 ENCOUNTER — Telehealth: Payer: Self-pay | Admitting: Hematology and Oncology

## 2023-01-16 NOTE — Telephone Encounter (Signed)
Cancelled and rescheduled appointment per room/resource and 9/5 secure chat. Talked with the patients sister and she is aware of all changes made.

## 2023-02-03 ENCOUNTER — Encounter: Payer: Self-pay | Admitting: Hematology and Oncology

## 2023-02-03 NOTE — Patient Instructions (Signed)

## 2023-02-04 DIAGNOSIS — G809 Cerebral palsy, unspecified: Secondary | ICD-10-CM | POA: Diagnosis not present

## 2023-02-05 ENCOUNTER — Inpatient Hospital Stay: Payer: 59 | Attending: Hematology and Oncology

## 2023-02-05 ENCOUNTER — Encounter: Payer: Self-pay | Admitting: Hematology and Oncology

## 2023-02-05 ENCOUNTER — Ambulatory Visit: Payer: 59 | Admitting: Hematology and Oncology

## 2023-02-05 VITALS — BP 100/64 | HR 84 | Temp 98.3°F

## 2023-02-05 DIAGNOSIS — C7951 Secondary malignant neoplasm of bone: Secondary | ICD-10-CM | POA: Insufficient documentation

## 2023-02-05 DIAGNOSIS — Z170 Estrogen receptor positive status [ER+]: Secondary | ICD-10-CM | POA: Diagnosis not present

## 2023-02-05 DIAGNOSIS — Z5112 Encounter for antineoplastic immunotherapy: Secondary | ICD-10-CM | POA: Diagnosis not present

## 2023-02-05 DIAGNOSIS — C50411 Malignant neoplasm of upper-outer quadrant of right female breast: Secondary | ICD-10-CM | POA: Insufficient documentation

## 2023-02-05 MED ORDER — TRASTUZUMAB-ANNS CHEMO 150 MG IV SOLR
6.0000 mg/kg | Freq: Once | INTRAVENOUS | Status: AC
  Administered 2023-02-05: 357 mg via INTRAVENOUS
  Filled 2023-02-05: qty 17

## 2023-02-05 MED ORDER — ACETAMINOPHEN 325 MG PO TABS
650.0000 mg | ORAL_TABLET | Freq: Once | ORAL | Status: AC
  Administered 2023-02-05: 650 mg via ORAL
  Filled 2023-02-05: qty 2

## 2023-02-05 MED ORDER — SODIUM CHLORIDE 0.9 % IV SOLN: Freq: Once | INTRAVENOUS | Status: AC

## 2023-02-05 MED ORDER — SODIUM CHLORIDE 0.9% FLUSH
10.0000 mL | INTRAVENOUS | Status: DC | PRN
  Administered 2023-02-05: 10 mL

## 2023-02-05 MED ORDER — DIPHENHYDRAMINE HCL 25 MG PO CAPS
50.0000 mg | ORAL_CAPSULE | Freq: Once | ORAL | Status: AC
  Administered 2023-02-05: 50 mg via ORAL
  Filled 2023-02-05: qty 2

## 2023-02-05 NOTE — Progress Notes (Signed)
Kanjinti slightly over 10 %.  Ok to give by Neldon Mc.

## 2023-02-05 NOTE — Patient Instructions (Signed)
[facility]  Discharge Instructions: Thank you for choosing [facility] to provide your oncology and hematology care.   If you have a lab appointment with the Cancer Center, please go directly to the Cancer Center and check in at the registration area.   Wear comfortable clothing and clothing appropriate for easy access to any Portacath or PICC line.   We strive to give you quality time with your provider. You may need to reschedule your appointment if you arrive late (15 or more minutes).  Arriving late affects you and other patients whose appointments are after yours.  Also, if you miss three or more appointments without notifying the office, you may be dismissed from the clinic at the provider's discretion.      For prescription refill requests, have your pharmacy contact our office and allow 72 hours for refills to be completed.    Today you received the following chemotherapy and/or immunotherapy agents :  Trastuzumab      To help prevent nausea and vomiting after your treatment, we encourage you to take your nausea medication as directed.  BELOW ARE SYMPTOMS THAT SHOULD BE REPORTED IMMEDIATELY: *FEVER GREATER THAN 100.4 F (38 C) OR HIGHER *CHILLS OR SWEATING *NAUSEA AND VOMITING THAT IS NOT CONTROLLED WITH YOUR NAUSEA MEDICATION *UNUSUAL SHORTNESS OF BREATH *UNUSUAL BRUISING OR BLEEDING *URINARY PROBLEMS (pain or burning when urinating, or frequent urination) *BOWEL PROBLEMS (unusual diarrhea, constipation, pain near the anus) TENDERNESS IN MOUTH AND THROAT WITH OR WITHOUT PRESENCE OF ULCERS (sore throat, sores in mouth, or a toothache) UNUSUAL RASH, SWELLING OR PAIN  UNUSUAL VAGINAL DISCHARGE OR ITCHING   Items with * indicate a potential emergency and should be followed up as soon as possible or go to the Emergency Department if any problems should occur.  Please show the CHEMOTHERAPY ALERT CARD or IMMUNOTHERAPY ALERT CARD at  check-in to the Emergency Department and triage nurse.  Should you have questions after your visit or need to cancel or reschedule your appointment, please contact [facility] [facility]  Dept: [phone]  and follow the prompts.  Office hours are 8:00 a.m. to 4:30 p.m. Monday - Friday. Please note that voicemails left after 4:00 p.m. may not be returned until the following business day.  We are closed weekends and major holidays. You have access to a nurse at all times for urgent questions. Please call the main number to the clinic Dept: [phone] and follow the prompts.   For any non-urgent questions, you may also contact your provider using MyChart. We now offer e-Visits for anyone 44 and older to request care online for non-urgent symptoms. For details visit [email or web address].   Also download the MyChart app! Go to the app store, search "MyChart", open the app, select [place], and log in with your MyChart username and password.

## 2023-02-10 ENCOUNTER — Ambulatory Visit (INDEPENDENT_AMBULATORY_CARE_PROVIDER_SITE_OTHER): Payer: 59 | Admitting: Family Medicine

## 2023-02-10 ENCOUNTER — Encounter: Payer: Self-pay | Admitting: Family Medicine

## 2023-02-10 VITALS — BP 140/80 | HR 80 | Temp 98.6°F | Ht 62.0 in

## 2023-02-10 DIAGNOSIS — E1159 Type 2 diabetes mellitus with other circulatory complications: Secondary | ICD-10-CM

## 2023-02-10 DIAGNOSIS — E1169 Type 2 diabetes mellitus with other specified complication: Secondary | ICD-10-CM

## 2023-02-10 DIAGNOSIS — E119 Type 2 diabetes mellitus without complications: Secondary | ICD-10-CM | POA: Diagnosis not present

## 2023-02-10 DIAGNOSIS — Z7984 Long term (current) use of oral hypoglycemic drugs: Secondary | ICD-10-CM | POA: Diagnosis not present

## 2023-02-10 DIAGNOSIS — I152 Hypertension secondary to endocrine disorders: Secondary | ICD-10-CM

## 2023-02-10 DIAGNOSIS — E785 Hyperlipidemia, unspecified: Secondary | ICD-10-CM | POA: Diagnosis not present

## 2023-02-10 DIAGNOSIS — Z23 Encounter for immunization: Secondary | ICD-10-CM | POA: Diagnosis not present

## 2023-02-10 LAB — BAYER DCA HB A1C WAIVED: HB A1C (BAYER DCA - WAIVED): 6.6 % — ABNORMAL HIGH (ref 4.8–5.6)

## 2023-02-10 NOTE — Progress Notes (Signed)
Subjective: CC:DM PCP: Sherri Ip, DO [id number]:[id number] [name] is a 57 y.o. female presenting to clinic today for:  1. Type 2 Diabetes with hypertension, hyperlipidemia:  Patient is brought to the office by her sister.  Things are going well.  She reports that sometimes [name] abstains from eating because she "does not want to gain weight".  They really try to encourage her to eat a balanced diet because of her underlying medical conditions and worry about her restricting food.  She has had no low blood sugar since her last visit and on average blood sugars have been running around 100 in the morning with a high of 157 which is rare.  Home blood pressures are running 140s over 80s typically.  She is compliant with her Alma Friendly, [name], Coreg, Cozaar and Pravachol.  No reports of vaginitis, dysuria, hematuria, chest pain, shortness of breath  There are no preventive care reminders to display for this patient.  Last A1c:  Lab Results  Component Value Date   HGBA1C 7.1 (H) 10/09/2022   ROS: Per HPI  Allergies  Allergen Reactions   Hydrocodone Nausea And Vomiting and Other (See Comments)    Vomiting and hallunications   Past Medical History:  Diagnosis Date   Arthritis    Cerebral palsy (HCC)    Complication of anesthesia 12/09/2013   Respiratory instability with 10 mg Propofol.Poor gag reflex.   Constipation - functional    Dysphagia 2003   2O to GERD/HH, ?difficulty with large food bolus due to neuromuscular weakness   Epigastric pain MAY 2012 ABD [place] NL GBLIVPAN   2o to GERD V. NON-ULCER DYSPEPSIA   GERD (gastroesophageal reflux disease) 2007   BPE NL ESO, REFLUX   Hiatal hernia    HIATAL HERNIA 11/23/2008   Qualifier: Diagnosis of  By: Diana Eves     HTN (hypertension)    Hyperlipidemia     Current Outpatient Medications:    Blood Glucose Monitoring Suppl DEVI, Check BGs up to twice daily.  E11.9 Wants One TOuch May substitute to any manufacturer covered by  patient's insurance., Disp: 1 each, Rfl: 0   carvedilol (COREG) 3.125 MG tablet, Take 1 tablet (3.125 mg total) by mouth 2 (two) times daily., Disp: 180 tablet, Rfl: 3   dapagliflozin propanediol (FARXIGA) 10 MG TABS tablet, Take 1 tablet (10 mg total) by mouth daily before breakfast., Disp: 90 tablet, Rfl: 3   Glucose Blood (BLOOD GLUCOSE TEST STRIPS) STRP, Check BGs up to twice daily. E11.9 One touch. May substitute to any manufacturer covered by patient's insurance., Disp: 100 strip, Rfl: PRN   Lancet Device MISC, Check BGs daily as directed. E11.9. May substitute to any manufacturer covered by patient's insurance., Disp: 1 each, Rfl: 0   Lancets Misc. MISC, Check BGs up to twice daily. E11.9 May substitute to any manufacturer covered by patient's insurance., Disp: 100 each, Rfl: PRN   losartan (COZAAR) 50 MG tablet, Take 1 tablet (50 mg total) by mouth daily., Disp: 90 tablet, Rfl: 3   megestrol (MEGACE) 40 MG tablet, Take 1 tablet (40 mg total) by mouth 2 (two) times daily. Take 3 tablets every day, Disp: 90 tablet, Rfl: 7   Multiple Vitamin (MULTI-VITAMIN) tablet, Take 1 tablet by mouth daily., Disp: , Rfl:    nystatin cream (MYCOSTATIN), Apply 1 Application topically 2 (two) times daily. To area between breast for rash x7-14 days, Disp: 30 g, Rfl: 0   pantoprazole (PROTONIX) 40 MG tablet, TAKE 1 TABLET BY  MOUTH 30 MINUTES PRIOR TO BREAKFAST AND SUPPER,, Disp: 180 tablet, Rfl: 3   potassium chloride (KLOR-CON) 10 MEQ tablet, TAKE 1 TABLET BY MOUTH EVERY DAY, Disp: 90 tablet, Rfl: 1   pravastatin (PRAVACHOL) 40 MG tablet, Take 1 tablet (40 mg total) by mouth daily., Disp: 90 tablet, Rfl: 3   sitaGLIPtin (JANUVIA) 50 MG tablet, Take 1 tablet (50 mg total) by mouth daily. Take with [name] daily for sugar., Disp: 90 tablet, Rfl: 3   tamoxifen (NOLVADEX) 20 MG tablet, TAKE 1 TABLET BY MOUTH EVERY DAY, Disp: 90 tablet, Rfl: 3 Social History   Socioeconomic History   Marital status: Single     Spouse name: Not on file   Number of children: Not on file   Years of education: Not on file   Highest education level: Not on file  Occupational History   Not on file  Tobacco Use   Smoking status: Never   Smokeless tobacco: Never  Substance and Sexual Activity   Alcohol use: No   Drug use: Yes    Types: PCP   Sexual activity: Not Currently    Birth control/protection: None  Other Topics Concern   Not on file  Social History Narrative   Not on file   Social Determinants of Health   Financial Resource Strain: Low Risk  (11/06/2022)   Overall Financial Resource Strain (CARDIA)    Difficulty of Paying Living Expenses: Not hard at all  Food Insecurity: No Food Insecurity (11/06/2022)   Hunger Vital Sign    Worried About Running Out of Food in the Last Year: Never true    Ran Out of Food in the Last Year: Never true  Transportation Needs: No Transportation Needs (11/06/2022)   PRAPARE - Administrator, Civil Service (Medical): No    Lack of Transportation (Non-Medical): No  Physical Activity: Insufficiently Active (11/06/2022)   Exercise Vital Sign    Days of Exercise per Week: 3 days    Minutes of Exercise per Session: 30 min  Stress: No Stress Concern Present (11/06/2022)   [employer] of Occupational Health - Occupational Stress Questionnaire    Feeling of Stress : Only a little  Social Connections: Moderately Isolated (11/06/2022)   Social Connection and Isolation Panel [NHANES]    Frequency of Communication with Friends and Family: More than three times a week    Frequency of Social Gatherings with Friends and Family: More than three times a week    Attends Religious Services: 1 to 4 times per year    Active Member of [employer] or Organizations: No    Attends Banker Meetings: Never    Marital Status: Never married  Intimate Partner Violence: Not At Risk (11/06/2022)   Humiliation, Afraid, Rape, and Kick questionnaire    Fear of Current or  Ex-Partner: No    Emotionally Abused: No    Physically Abused: No    Sexually Abused: No   Family History  Problem Relation Age of Onset   Hypertension Mother    Diabetes Mother    Colon cancer Mother        LATE 50S   Hypertension Brother    Diabetes Brother    Diabetes Maternal Grandmother    Diabetes Maternal Grandfather    Hypertension Maternal Grandfather     Objective: Office vital signs reviewed. BP (!) 140/80 Comment: home BP  Pulse 80   Temp 98.6 F (37 C)   Ht 5\' 2"  (1.575 m)  SpO2 95%   BMI 21.25 kg/m   Physical Examination:  General: Awake, alert, well nourished, No acute distress HEENT: sclera white, MMM Cardio: regular rate and rhythm, S1S2 heard, no murmurs appreciated Pulm: clear to auscultation bilaterally, no wheezes, rhonchi or rales; normal work of breathing on room air MSK: Arrives in wheelchair.  Tone is fair  Assessment/ Plan: 57 y.o. female   Diabetes mellitus treated with oral medication (HCC) - Plan: Bayer DCA Hb A1c Waived, Microalbumin / creatinine urine ratio  Hypertension associated with diabetes (HCC) - Plan: Microalbumin / creatinine urine ratio  Hyperlipidemia associated with type 2 diabetes mellitus (HCC)  Encounter for immunization - Plan: Flu vaccine trivalent PF, 6mos and older(Flulaval,Afluria,Fluarix,Fluzone)   Her sugar is now controlled with A1c dropping to 6.6 from 7.1.  She is tolerating medications without difficulty and demonstrates no red flag signs or symptoms, particularly with [place] use.  I have encouraged her to eat scheduled meals as I worry about her being on medications that can induce hypoglycemia.  She and her caregiver voiced good understanding today.  Urine microalbumin collected.  Blood pressure was not well-controlled in office but home blood pressures are controlled.  She had not taken her blood pressure medication prior to arrival today.  Encouraged her to follow-up with blood pressure readings  soon  Continue statin.  Not due for fasting lipid.  Influenza vaccination administered  Sherri Ip, DO [facility] [phone]

## 2023-02-12 DIAGNOSIS — H2513 Age-related nuclear cataract, bilateral: Secondary | ICD-10-CM | POA: Diagnosis not present

## 2023-02-12 DIAGNOSIS — H5213 Myopia, bilateral: Secondary | ICD-10-CM | POA: Diagnosis not present

## 2023-02-18 ENCOUNTER — Other Ambulatory Visit: Payer: Self-pay | Admitting: Hematology and Oncology

## 2023-02-18 DIAGNOSIS — K219 Gastro-esophageal reflux disease without esophagitis: Secondary | ICD-10-CM

## 2023-02-28 ENCOUNTER — Other Ambulatory Visit: Payer: Self-pay | Admitting: Hematology and Oncology

## 2023-03-19 ENCOUNTER — Inpatient Hospital Stay: Payer: 59 | Attending: Hematology and Oncology | Admitting: Adult Health

## 2023-03-19 ENCOUNTER — Inpatient Hospital Stay: Payer: 59 | Admitting: Hematology and Oncology

## 2023-03-19 ENCOUNTER — Ambulatory Visit: Payer: 59

## 2023-03-19 ENCOUNTER — Encounter: Payer: Self-pay | Admitting: Adult Health

## 2023-03-19 ENCOUNTER — Other Ambulatory Visit: Payer: Self-pay

## 2023-03-19 ENCOUNTER — Inpatient Hospital Stay: Payer: 59 | Attending: Hematology and Oncology

## 2023-03-19 VITALS — BP 126/72 | HR 75 | Temp 97.9°F | Resp 18 | Wt 114.7 lb

## 2023-03-19 VITALS — BP 137/78 | HR 84 | Temp 98.4°F | Resp 16

## 2023-03-19 DIAGNOSIS — C7951 Secondary malignant neoplasm of bone: Secondary | ICD-10-CM | POA: Diagnosis not present

## 2023-03-19 DIAGNOSIS — Z170 Estrogen receptor positive status [ER+]: Secondary | ICD-10-CM | POA: Diagnosis not present

## 2023-03-19 DIAGNOSIS — Z1722 Progesterone receptor negative status: Secondary | ICD-10-CM | POA: Insufficient documentation

## 2023-03-19 DIAGNOSIS — Z1731 Human epidermal growth factor receptor 2 positive status: Secondary | ICD-10-CM | POA: Insufficient documentation

## 2023-03-19 DIAGNOSIS — C50411 Malignant neoplasm of upper-outer quadrant of right female breast: Secondary | ICD-10-CM | POA: Insufficient documentation

## 2023-03-19 DIAGNOSIS — Z9013 Acquired absence of bilateral breasts and nipples: Secondary | ICD-10-CM | POA: Diagnosis not present

## 2023-03-19 DIAGNOSIS — Z5112 Encounter for antineoplastic immunotherapy: Secondary | ICD-10-CM | POA: Diagnosis not present

## 2023-03-19 MED ORDER — SODIUM CHLORIDE 0.9% FLUSH
10.0000 mL | INTRAVENOUS | Status: DC | PRN
  Administered 2023-03-19: 10 mL

## 2023-03-19 MED ORDER — DIPHENHYDRAMINE HCL 25 MG PO CAPS
50.0000 mg | ORAL_CAPSULE | Freq: Once | ORAL | Status: AC
Start: 2023-03-19 — End: 2023-03-19
  Administered 2023-03-19: 50 mg via ORAL
  Filled 2023-03-19: qty 2

## 2023-03-19 MED ORDER — SODIUM CHLORIDE 0.9 % IV SOLN: Freq: Once | INTRAVENOUS | Status: AC

## 2023-03-19 MED ORDER — ACETAMINOPHEN 325 MG PO TABS
650.0000 mg | ORAL_TABLET | Freq: Once | ORAL | Status: AC
  Administered 2023-03-19: 650 mg via ORAL
  Filled 2023-03-19: qty 2

## 2023-03-19 MED ORDER — HEPARIN SOD (PORK) LOCK FLUSH 100 UNIT/ML IV SOLN
500.0000 [IU] | Freq: Once | INTRAVENOUS | Status: AC | PRN
Start: 2023-03-19 — End: 2023-03-19
  Administered 2023-03-19: 500 [IU]

## 2023-03-19 MED ORDER — TRASTUZUMAB-ANNS CHEMO 150 MG IV SOLR
300.0000 mg | Freq: Once | INTRAVENOUS | Status: AC
  Administered 2023-03-19: 300 mg via INTRAVENOUS
  Filled 2023-03-19: qty 14.29

## 2023-03-19 NOTE — Progress Notes (Signed)
Cancer Center Cancer Follow up:    Sherri Ip, DO [address] [place] [address]   DIAGNOSIS:  Cancer Staging  Malignant neoplasm of upper-outer quadrant of right breast in female, estrogen receptor positive (HCC) Staging form: Breast, AJCC 8th Edition - Clinical: Stage IIA (cT2, cN0, cM0, G1, ER+, PR-, HER2+) - Unsigned Method of lymph node assessment: Clinical Histologic grading system: 3 grade system Laterality: Right Tumor size (mm): 21 - Pathologic stage from 05/11/2018: Stage IIIB (pT2, pN3, cM0, G3, ER+, PR-, HER2+) - Signed by Sherri Croissant, MD on 05/11/2018 Neoadjuvant therapy: No Multigene prognostic tests performed: None Histologic grading system: 3 grade system   SUMMARY OF ONCOLOGIC HISTORY: Oncology History  Malignant neoplasm of upper-outer quadrant of right breast in female, estrogen receptor positive (HCC)  03/18/2018 Initial Diagnosis   Screening detected bilateral breast abnormalities.  Right breast mass UOQ 2.1 cm at 10 o'clock position: Grade 1 ILC ER 70%, PR 0%, HER-2 +3+ by IHC, Ki-67 5%, suspected lymph node in the axilla could not be biopsied; left breast calcifications by ultrasound not visible but 5 cm cystic/tubular structure which on biopsy was fibrocystic change with ADHl; T2N0 stage IIa clinical stage   05/01/2018 Surgery   Bilateral mastectomies: Left mastectomy: DCIS intermediate grade 0.9 cm margins negative Tis NX stage 0; right mastectomy: ILC, grade 3, 3.5 cm, with LCIS, perineural invasion present, margins negative, 15/22 lymph nodes positive with extracapsular extension, ER 90%, PR 0%, HER-2 +(IHC 3+), Ki-67 5%, T2N3 Stage 3B    05/11/2018 Cancer Staging   Staging form: Breast, AJCC 8th Edition - Pathologic stage from 05/11/2018: Stage IIIB (pT2, pN3, cM0, G3, ER+, PR-, HER2+) - Signed by Sherri Croissant, MD on 05/11/2018   05/2018 -  Anti-estrogen oral therapy   Tamoxifen daily   06/03/2018 Imaging   Bone scan:  Increased tracer uptake in the lumbar spine at L1, L3 and L4, right fourth and seventh ribs, inferior right scapula and distal sternum, T7 and T11 as well.   CT CAP showed widespread bone metastases with possible pathologic fracture at L3, peripheral right upper lobe nodules nonspecific   06/10/2018 -  Chemotherapy   Taxol Kingenti and Perjeta    10/27/2018 PET scan   Bone metastases less metabolically active.  Couple of lymph nodes with low-level metabolic activity noted.   07/10/2022 - 07/10/2022 Chemotherapy   Patient is on Treatment Plan : BREAST Trastuzumab IV (8/6) or SQ (600) D1 q21d     08/21/2022 -  Chemotherapy   Patient is on Treatment Plan : BREAST MAINTENANCE Trastuzumab IV (6) or SQ (600) D1 q21d x 13 cycles     Malignant neoplasm metastatic to bone (HCC)  06/04/2018 Initial Diagnosis   Malignant neoplasm metastatic to bone (HCC)   07/10/2022 - 07/10/2022 Chemotherapy   Patient is on Treatment Plan : BREAST Trastuzumab IV (8/6) or SQ (600) D1 q21d       CURRENT THERAPY: Herceptin; Tamoxifen  INTERVAL HISTORY:  Discussed the use of AI scribe software for clinical note transcription with the patient, who gave verbal consent to proceed.  [name] 57 y.o. female returns for follow-up of her metastatic breast cancer on treatment with Herceptin every 6 weeks and tamoxifen daily.  She tolerates both these treatments well.  She denies any issues or significant concerns.  Her most recent echocardiogram occurred December 10, 2022 that demonstrated a normal ejection fraction.  Her most recent scans occurred in August 2024 showing no progression  of disease.  She denies any concerning symptoms today and she and her caregiver tell me that she is doing quite well with no questions or concerns.    Patient Active Problem List   Diagnosis Date Noted   Goals of care, counseling/discussion 09/14/2018   Port-A-Cath in place 06/17/2018   Malignant neoplasm metastatic to bone (HCC) 06/04/2018    Breast cancer, stage 2, right (HCC) 05/01/2018   Malignant neoplasm of upper-outer quadrant of right breast in female, estrogen receptor positive (HCC) 03/23/2018   Pre-diabetes 12/24/2016   Healthcare maintenance 12/26/2014   Headache 12/26/2014   Frequency of micturition 09/03/2013   Need for Tdap vaccination 09/03/2013   Pain in limb 04/12/2013   Wart viral 04/12/2013   HLD (hyperlipidemia) 09/16/2012   Vitamin D deficiency 09/16/2012   Hyperglycemia 09/16/2012   HTN (hypertension) 09/16/2012   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  MEDICAL HISTORY: Past Medical History:  Diagnosis Date   Arthritis    Cerebral palsy (HCC)    Complication of anesthesia 12/09/2013   Respiratory instability with 10 mg Propofol.Poor gag reflex.   Constipation - functional    Dysphagia 2003   2O to GERD/HH, ?difficulty with large food bolus due to neuromuscular weakness   Epigastric pain MAY 2012 ABD [place] NL GBLIVPAN   2o to GERD V. NON-ULCER DYSPEPSIA   GERD (gastroesophageal reflux disease) 2007   BPE NL ESO, REFLUX   Hiatal hernia    HIATAL HERNIA 11/23/2008   Qualifier: Diagnosis of  By: [name]     HTN (hypertension)    Hyperlipidemia     SURGICAL HISTORY: Past Surgical History:  Procedure Laterality Date   COLONOSCOPY  APR 2009 with proprofol   SLF: SIMPLE ADENOMAS   MASTECTOMY W/ SENTINEL NODE BIOPSY Bilateral 05/01/2018   Procedure: BILATERAL MASTECTOMIES WITH LEFT SENTINEL LYMPH NODE BIOPSIES AND RIGHT AXILLARY LYMPH NODE DISSECTION;  Surgeon: Sherri Kin, MD;  Location: [facility] OR;  Service: General;  Laterality: Bilateral;   PORTACATH PLACEMENT N/A 05/01/2018   Procedure: INSERTION PORT-A-CATH WITH [place];  Surgeon: Sherri Kin, MD;  Location: [facility] OR;  Service: General;   Laterality: N/A;   UPPER GASTROINTESTINAL ENDOSCOPY  MAY 2012 DIL 16 mm   SLF: NO DEFINITE STRICTURE APPRECIATED   UPPER GASTROINTESTINAL ENDOSCOPY  FEB 2009-HYPOXIA REQUIRING NARCAN, & VERSED, D50V4   SLF: NL ESO, FUNDIC GLAND POLYPS   UPPER GASTROINTESTINAL ENDOSCOPY  w/ DIL 2003, 2004, 2006    SOCIAL HISTORY: Social History   Socioeconomic History   Marital status: Single    Spouse name: Not on file   Number of children: Not on file   Years of education: Not on file   Highest education level: Not on file  Occupational History   Not on file  Tobacco Use   Smoking status: Never   Smokeless tobacco: Never  Substance and Sexual Activity   Alcohol use: No   Drug use: Yes    Types: PCP   Sexual activity: Not Currently    Birth control/protection: None  Other Topics Concern   Not on file  Social History Narrative   Not on file   Social Determinants of Health   Financial Resource Strain: Low Risk  (11/06/2022)   Overall Financial Resource Strain (CARDIA)    Difficulty of Paying Living Expenses:  Not hard at all  Food Insecurity: No Food Insecurity (11/06/2022)   Hunger Vital Sign    Worried About Running Out of Food in the Last Year: Never true    Ran Out of Food in the Last Year: Never true  Transportation Needs: No Transportation Needs (11/06/2022)   PRAPARE - Administrator, Civil Service (Medical): No    Lack of Transportation (Non-Medical): No  Physical Activity: Insufficiently Active (11/06/2022)   Exercise Vital Sign    Days of Exercise per Week: 3 days    Minutes of Exercise per Session: 30 min  Stress: No Stress Concern Present (11/06/2022)   [employer] of Occupational Health - Occupational Stress Questionnaire    Feeling of Stress : Only a little  Social Connections: Moderately Isolated (11/06/2022)   Social Connection and Isolation Panel [NHANES]    Frequency of Communication with Friends and Family: More than three times a week    Frequency of  Social Gatherings with Friends and Family: More than three times a week    Attends Religious Services: 1 to 4 times per year    Active Member of [employer] or Organizations: No    Attends Banker Meetings: Never    Marital Status: Never married  Intimate Partner Violence: Not At Risk (11/06/2022)   Humiliation, Afraid, Rape, and Kick questionnaire    Fear of Current or Ex-Partner: No    Emotionally Abused: No    Physically Abused: No    Sexually Abused: No    FAMILY HISTORY: Family History  Problem Relation Age of Onset   Hypertension Mother    Diabetes Mother    Colon cancer Mother        LATE 50S   Hypertension Brother    Diabetes Brother    Diabetes Maternal Grandmother    Diabetes Maternal Grandfather    Hypertension Maternal Grandfather     Review of Systems  Constitutional:  Negative for appetite change, chills, fatigue, fever and unexpected weight change.  HENT:   Negative for hearing loss, lump/mass and trouble swallowing.   Eyes:  Negative for eye problems and icterus.  Respiratory:  Negative for chest tightness, cough and shortness of breath.   Cardiovascular:  Negative for chest pain, leg swelling and palpitations.  Gastrointestinal:  Negative for abdominal distention, abdominal pain, constipation, diarrhea, nausea and vomiting.  Endocrine: Negative for hot flashes.  Genitourinary:  Negative for difficulty urinating.   Musculoskeletal:  Negative for arthralgias.  Skin:  Negative for itching and rash.  Neurological:  Negative for dizziness, extremity weakness, headaches and numbness.  Hematological:  Negative for adenopathy. Does not bruise/bleed easily.  Psychiatric/Behavioral:  Negative for depression. The patient is not nervous/anxious.       PHYSICAL EXAMINATION    Vitals:   03/19/23 0906  BP: 126/72  Pulse: 75  Resp: 18  Temp: 97.9 F (36.6 C)  SpO2: 97%    Physical Exam Constitutional:      General: She is not in acute distress.     Appearance: Normal appearance. She is not toxic-appearing.     Comments: Examined in wheelchair  HENT:     Head: Normocephalic and atraumatic.  Eyes:     General: No scleral icterus. Cardiovascular:     Rate and Rhythm: Normal rate and regular rhythm.     Pulses: Normal pulses.     Heart sounds: Normal heart sounds.  Pulmonary:     Effort: Pulmonary effort is normal.  Breath sounds: Normal breath sounds.  Musculoskeletal:        General: No swelling.     Cervical back: Neck supple.  Skin:    General: Skin is warm and dry.     Findings: No rash.  Neurological:     General: No focal deficit present.     Mental Status: She is alert.  Psychiatric:        Mood and Affect: Mood normal.        Behavior: Behavior normal.      ASSESSMENT and THERAPY PLAN:   Malignant neoplasm of upper-outer quadrant of right breast in female, estrogen receptor positive (HCC) 05/01/18: Bilateral mastectomies: Left mastectomy: DCIS intermediate grade 0.9 cm margins negative Tis NX stage 0; right mastectomy: ILC, grade 3, 3.5 cm, with LCIS, perineural invasion present, margins negative, 15/22 lymph nodes positive with extracapsular extension, ER 90%, PR 0%, HER-2 +(IHC 3+), Ki-67 5%, T2N3M1 Stage IV   06/03/2018 bone scan: Increased tracer uptake in the lumbar spine at L1, L3 and L4, right fourth and seventh ribs, inferior right scapula and distal sternum, T7 and T11 as well.   CT CAP showed widespread bone metastases with possible pathologic fracture at L3, peripheral right upper lobe nodules nonspecific Patient has cerebral palsy and her sister is the power of attorney for healthcare Treatment plan: Palliative treatment with Taxol x12 cycles completed 08/26/2018, Kingenti, Perjeta ------------------------------------------------------------------------------------------------------------------------ PET CT scan: 10/27/2018: Widespread bone metastatic disease demonstrates reduced with sclerosis and low  metabolic activity with SUV of 3-4.  Left level 4 lymph node 0.5 cm and left deep parotid lymph node SUV 4.1 will need follow-up.  Uterine leiomyomas. PET/CT 11/11/2019: Continued mild hypermetabolic activity left supraclavicular lymph node 0.7 cm, old sclerotic bone lesions are stable and not hypermetabolic.  Uterine fibroids   CT CAP 05/27/2022: Similarly treated bone metastases.  Pulmonary nodules felt to be similar CT CAP 12/23/2022: Stable findings without any new or progressive disease.  Stable treated bone metastases.  Stable lung nodules favored to be benign.  Uterine fibroid Current treatment: Kingenti maintenance along with anastrozole She is enjoying time with her nephew. Tamoxifen toxicities: Denies any hot flashes or myalgias. Kingenti toxicities: None   Breast Cancer on Herceptin and Tamoxifen Tolerating treatment well with no new pain. Last echo on July 30th showed normal ejection fraction. -Continue Herceptin every six weeks. -Continue daily Tamoxifen -Repeat echo as per protocol.  Blood Glucose Occasional slight elevations, patient aware of cause (sweet tea) -Continue current management and monitor blood glucose levels. -Continue f/u with PCP   Surveillance Last scans were in August, due for repeat in February. -Schedule [facility] for February 20[facility].  General Health Maintenance -Continue follow-up with primary care physician. -Schedule next oncology appointment.   All questions were answered. The patient knows to call the clinic with any problems, questions or concerns. We can certainly see the patient much sooner if necessary.  Total encounter time:20 minutes*in face-to-face visit time, chart review, lab review, care coordination, order entry, and documentation of the encounter time.    Lillard Anes, NP 03/19/23 9:24 AM Medical Oncology and Hematology [facility] [address] [place], [place] [address] Tel. [phone]    Fax.  [phone]  *Total Encounter Time as defined by the Centers for Medicare and Medicaid Services includes, in addition to the face-to-face time of a patient visit (documented in the note above) non-face-to-face time: obtaining and reviewing outside history, ordering and reviewing medications, tests or procedures, care  coordination (communications with other health care professionals or caregivers) and documentation in the medical record.

## 2023-03-19 NOTE — Patient Instructions (Signed)
[facility] - A DEPT OF [facility]  Discharge Instructions: Thank you for choosing [facility] to provide your oncology and hematology care.   If you have a lab appointment with the Cancer Center, please go directly to the Cancer Center and check in at the registration area.   Wear comfortable clothing and clothing appropriate for easy access to any Portacath or PICC line.   We strive to give you quality time with your provider. You may need to reschedule your appointment if you arrive late (15 or more minutes).  Arriving late affects you and other patients whose appointments are after yours.  Also, if you miss three or more appointments without notifying the office, you may be dismissed from the clinic at the provider's discretion.      For prescription refill requests, have your pharmacy contact our office and allow 72 hours for refills to be completed.    Today you received the following chemotherapy and/or immunotherapy agents :  Trastuzumab      To help prevent nausea and vomiting after your treatment, we encourage you to take your nausea medication as directed.  BELOW ARE SYMPTOMS THAT SHOULD BE REPORTED IMMEDIATELY: *FEVER GREATER THAN 100.4 F (38 C) OR HIGHER *CHILLS OR SWEATING *NAUSEA AND VOMITING THAT IS NOT CONTROLLED WITH YOUR NAUSEA MEDICATION *UNUSUAL SHORTNESS OF BREATH *UNUSUAL BRUISING OR BLEEDING *URINARY PROBLEMS (pain or burning when urinating, or frequent urination) *BOWEL PROBLEMS (unusual diarrhea, constipation, pain near the anus) TENDERNESS IN MOUTH AND THROAT WITH OR WITHOUT PRESENCE OF ULCERS (sore throat, sores in mouth, or a toothache) UNUSUAL RASH, SWELLING OR PAIN  UNUSUAL VAGINAL DISCHARGE OR ITCHING   Items with * indicate a potential emergency and should be followed up as soon as possible or go to the Emergency Department if any problems should occur.  Please show the CHEMOTHERAPY ALERT CARD or  IMMUNOTHERAPY ALERT CARD at check-in to the Emergency Department and triage nurse.  Should you have questions after your visit or need to cancel or reschedule your appointment, please contact [facility] - A DEPT OF [facility]  Dept: [phone]  and follow the prompts.  Office hours are 8:00 a.m. to 4:30 p.m. Monday - Friday. Please note that voicemails left after 4:00 p.m. may not be returned until the following business day.  We are closed weekends and major holidays. You have access to a nurse at all times for urgent questions. Please call the main number to the clinic Dept: [phone] and follow the prompts.   For any non-urgent questions, you may also contact your provider using MyChart. We now offer e-Visits for anyone 74 and older to request care online for non-urgent symptoms. For details visit [email or web address].   Also download the MyChart app! Go to the app store, search "MyChart", open the app, select , and log in with your MyChart username and password.

## 2023-03-19 NOTE — Progress Notes (Signed)
Per Lillard Anes, Np, OK to treat herceptin today with echo from 11/2022. Pt will be scheduled for echo before next tx.

## 2023-03-19 NOTE — Assessment & Plan Note (Addendum)
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   CT CAP showed widespread bone metastases with possible pathologic fracture at L3, peripheral right upper lobe nodules nonspecific Patient has cerebral palsy and her sister is the power of attorney for healthcare Treatment plan: Palliative treatment with Taxol x12 cycles completed 08/26/2018, Kingenti, Perjeta ------------------------------------------------------------------------------------------------------------------------ PET CT scan: 10/27/2018: Widespread bone metastatic disease demonstrates reduced with sclerosis and low metabolic activity with SUV of 3-4.  Left level 4 lymph node 0.5 cm and left deep parotid lymph node SUV 4.1 will need follow-up.  Uterine leiomyomas. PET/CT 11/11/2019: Continued mild hypermetabolic activity left supraclavicular lymph node 0.7 cm, old sclerotic bone lesions are stable and not hypermetabolic.  Uterine fibroids   CT CAP 05/27/2022: Similarly treated bone metastases.  Pulmonary nodules felt to be similar CT CAP 12/23/2022: Stable findings without any new or progressive disease.  Stable treated bone metastases.  Stable lung nodules favored to be benign.  Uterine fibroid Current treatment: Kingenti maintenance along with anastrozole She is enjoying time with her nephew. Tamoxifen toxicities: Denies any hot flashes or myalgias. Kingenti toxicities: None   Breast Cancer on Herceptin and Tamoxifen Tolerating treatment well with no new pain. Last echo on July 30th showed  normal ejection fraction. -Continue Herceptin every six weeks. -Continue daily Tamoxifen -Repeat echo as per protocol.  Blood Glucose Occasional slight elevations, patient aware of cause (sweet tea) -Continue current management and monitor blood glucose levels. -Continue f/u with PCP   Surveillance Last scans were in August, due for repeat in February. -Schedule scans for February 2025 at [facility].  General Health Maintenance -Continue follow-up with primary care physician. -Schedule next oncology appointment.

## 2023-03-19 NOTE — Progress Notes (Signed)
Pt scheduled for echo 04/07/23 at [place]. She is aware.

## 2023-03-26 ENCOUNTER — Ambulatory Visit: Payer: 59 | Attending: Internal Medicine | Admitting: Internal Medicine

## 2023-03-26 ENCOUNTER — Encounter: Payer: Self-pay | Admitting: Internal Medicine

## 2023-03-26 VITALS — BP 146/84 | HR 72 | Ht 62.0 in | Wt 114.4 lb

## 2023-03-26 DIAGNOSIS — R9431 Abnormal electrocardiogram [ECG] [EKG]: Secondary | ICD-10-CM | POA: Diagnosis not present

## 2023-03-26 NOTE — Progress Notes (Signed)
Cardiology Office Note:    Date:  03/26/2023   ID:  [name], DOB 05-11-1966, MRN [id number]  PCP:  [name], DO   [facility] Providers Cardiologist:  Sherri Fus, MD     Referring MD: Sherri Croissant, MD   No chief complaint on file. Cardio-Oncology clinic-high CVD risk, HER2 therapy  History of Present Illness:    [name] is a 57 y.o. female with a hx of cerebral palsy, R BC diagnosed 03/18/2018  ER+ Her2+, BL mastectomy, on tamoxifen since 2020, bone mets noted 10/27/2018, on Her2 q6 weeks, ECOG 1, DM2 referral for assessing CVD risk on Her2 therapy  She comes in today with her care giver/her sister. She has cerebral palsy. She is in a wheel chair.  She denies SOB. No chest pressure. No cardiac disease history. Her mother had DM2, cardiac disease, renal disease. Father passed at 9 years old. Sister has kidney disease.   The 10-year ASCVD risk score (Arnett DK, et al., 2019) is: 13%   Values used to calculate the score:     Age: 1 years     Sex: Female     Is Non-Hispanic African American: Yes     Diabetic: Yes     Tobacco smoker: No     Systolic Blood Pressure: 128 mmHg     Is BP treated: Yes     HDL Cholesterol: 44 mg/dL     Total Cholesterol: 157 mg/dL  Non smoker  Interim hx 09/10/2022 She denies chest pain or SOB. She is here with her sister, they have family in town [place] blood pressure  Interim hx 03/26/2023 [name] returns today for a follow-up appointment.  She did not take her blood pressure medication this morning.   Current Medications: Current Outpatient Medications on File Prior to Visit  Medication Sig Dispense Refill   Blood Glucose Monitoring Suppl DEVI Check BGs up to twice daily.  E11.9 Wants One TOuch May substitute to any manufacturer covered by patient's insurance. 1 each 0   carvedilol (COREG) 3.125 MG tablet Take 1 tablet (3.125 mg total) by mouth 2 (two) times daily. 180 tablet 3   dapagliflozin propanediol  (FARXIGA) 10 MG TABS tablet Take 1 tablet (10 mg total) by mouth daily before breakfast. 90 tablet 3   Glucose Blood (BLOOD GLUCOSE TEST STRIPS) STRP Check BGs up to twice daily. E11.9 One touch. May substitute to any manufacturer covered by patient's insurance. 100 strip PRN   Lancet Device MISC Check BGs daily as directed. E11.9. May substitute to any manufacturer covered by patient's insurance. 1 each 0   Lancets Misc. MISC Check BGs up to twice daily. E11.9 May substitute to any manufacturer covered by patient's insurance. 100 each PRN   losartan (COZAAR) 50 MG tablet Take 1 tablet (50 mg total) by mouth daily. 90 tablet 3   megestrol (MEGACE) 40 MG tablet Take 1 tablet (40 mg total) by mouth 2 (two) times daily. Take 3 tablets every day 90 tablet 7   Multiple Vitamin (MULTI-VITAMIN) tablet Take 1 tablet by mouth daily.     nystatin cream (MYCOSTATIN) Apply 1 Application topically 2 (two) times daily. To area between breast for rash x7-14 days 30 g 0   pantoprazole (PROTONIX) 40 MG tablet TAKE 1 TABLET BY MOUTH 30 MINUTES PRIOR TO BREAKFAST AND SUPPER, 180 tablet 3   potassium chloride (KLOR-CON) 10 MEQ tablet TAKE 1 TABLET BY MOUTH EVERY DAY 90 tablet 1  pravastatin (PRAVACHOL) 40 MG tablet Take 1 tablet (40 mg total) by mouth daily. 90 tablet 3   sitaGLIPtin (JANUVIA) 50 MG tablet Take 1 tablet (50 mg total) by mouth daily. Take with [name] daily for sugar. 90 tablet 3   tamoxifen (NOLVADEX) 20 MG tablet TAKE 1 TABLET BY MOUTH EVERY DAY 90 tablet 3   [DISCONTINUED] prochlorperazine (COMPAZINE) 10 MG tablet Take 1 tablet (10 mg total) by mouth every 6 (six) hours as needed (Nausea or vomiting). 30 tablet 1   No current facility-administered medications on file prior to visit.    ROS:   Please see the history of present illness.     All other systems reviewed and are negative.  EKGs/Labs/Other Studies Reviewed:    The following studies were reviewed today:   EKG:  EKG is  ordered  today.  The ekg ordered today demonstrates   06/11/2022- NSR, LAD  04/01/2018-EF nl, RV fxn nl, mild LVH, GLS -17, no sig valve dx 07/24/2018- EF 55-60, GLS -20, no sig valve dx, trivial pericardial effusion 10/2018-05/20/2022 TTE GLS -12.4% 11/04/2018 GLS -14 06/22/2019 GLS -17% 10/27/2019 GLS -13.5% 03/23/2020 GLS -20.5% 11/09/2020 GLS -22 06/05/2021 GLS -22.5% 11/19/2021 GLS -18/9%, Grade II DD E/e' 17 05/20/2022  Recent Labs: 06/25/2022: BNP <2.5 10/02/2022: ALT 11; BUN 16; Creatinine 0.58; Hemoglobin 12.3; Platelet Count 265; Potassium 3.8; Sodium 140   Recent Lipid Panel    Component Value Date/Time   CHOL 157 05/29/2022 1127   CHOL 143 06/09/2017 1137   CHOL 161 09/16/2012 1230   TRIG 70 05/29/2022 1127   TRIG 57 01/18/2013 1318   TRIG 58 09/16/2012 1230   HDL 44 05/29/2022 1127   HDL 51 06/09/2017 1137   HDL 66 01/18/2013 1318   HDL 68 09/16/2012 1230   CHOLHDL 3.6 05/29/2022 1127   VLDL 14 05/29/2022 1127   LDLCALC 99 05/29/2022 1127   LDLCALC 81 06/09/2017 1137   LDLCALC 91 01/18/2013 1318   LDLCALC 81 09/16/2012 1230   LDLDIRECT 74 02/01/2016 1623     Risk Assessment/Calculations:     Physical Exam:    VS:   Vitals:   03/26/23 0909  BP: (!) 146/84  Pulse: 72  SpO2: 96%     Wt Readings from Last 3 Encounters:  03/19/23 114 lb 11.2 oz (52 kg)  12/25/22 116 lb 3.2 oz (52.7 kg)  11/06/22 124 lb (56.2 kg)     GEN:  sitting in a wheelchair. Well nourished, well developed in no acute distress HEENT: Normal NECK: No JVD CARDIAC: RRR, no murmurs, rubs, gallops RESPIRATORY:  Clear to auscultation without rales, wheezing or rhonchi  ABDOMEN: Soft, non-tender, non-distended MUSCULOSKELETAL:  No edema; No deformity  SKIN: Warm and dry NEUROLOGIC:  Alert and oriented x 3 PSYCHIATRIC:  Normal affect   ASSESSMENT:    Metastatic BC: R BC. No L sided XRT. on her2 q 6 weeks - doing very well  - prior TTE's had reduced strain that has improved.  Most recent is -104  with a normal LV function - space out TTE, q 6 months while on HER2 therapy - escalating BP management/cardioprotection - bsl Bnp < 2.5  DM2: A1c 7.6. worries about metformin with difficulty with diarrhea. Started farxiga 10 mg daily. Planned for PCP appt this year  Lipids: LDL 99, goal < 100 mg/dL. At goal. With Dm2 diagnosis, recommend statin therapy. Continue pravastatin 40 mg daily  HTN: Typically well controlled.  Stopped lisinopril 40 mg, and started losartan 50  mg daily and carvediolol 3.125 mg BID  PLAN:    In order of problems listed above:  Limited TTE for GLS planned in 6 months, in May Follow up 6 months after echo      Medication Adjustments/Labs and Tests Ordered: Current medicines are reviewed at length with the patient today.  Concerns regarding medicines are outlined above.  No orders of the defined types were placed in this encounter.  No orders of the defined types were placed in this encounter.   There are no Patient Instructions on file for this visit.   Signed, Sherri Fus, MD  03/26/2023 8:50 AM    [facility]

## 2023-03-26 NOTE — Patient Instructions (Addendum)
Medication Instructions:  None    *If you need a refill on your cardiac medications before your next appointment, please call your pharmacy*   Lab Work: None    If you have labs (blood work) drawn today and your tests are completely normal, you will receive your results only by: MyChart Message (if you have MyChart) OR A paper copy in the mail If you have any lab test that is abnormal or we need to change your treatment, we will call you to review the results.   Testing/Procedures: Echo will be scheduled at [address].  Your physician has requested that you have an echocardiogram. Echocardiography is a painless test that uses sound waves to create images of your heart. It provides your doctor with information about the size and shape of your heart and how well your heart's chambers and valves are working. This procedure takes approximately one hour. There are no restrictions for this procedure. Please do NOT wear cologne, perfume, aftershave, or lotions (deodorant is allowed). Please arrive 15 minutes prior to your appointment time.    Follow-Up: At [facility], you and your health needs are our priority.  As part of our continuing mission to provide you with exceptional heart care, we have created designated Provider Care Teams.  These Care Teams include your primary Cardiologist (physician) and Advanced Practice Providers (APPs -  Physician Assistants and Nurse Practitioners) who all work together to provide you with the care you need, when you need it.  We recommend signing up for the patient portal called "MyChart".  Sign up information is provided on this After Visit Summary.  MyChart is used to connect with patients for Virtual Visits (Telemedicine).  Patients are able to view lab/test results, encounter notes, upcoming appointments, etc.  Non-urgent messages can be sent to your provider as well.   To learn more about what you can do with MyChart, go to  [email or web address].    Your next appointment:   7 month(s):   The format for your next appointment:   In Person  Provider:   Maisie Fus, MD    Other Instructions

## 2023-04-07 ENCOUNTER — Ambulatory Visit (HOSPITAL_COMMUNITY)
Admission: RE | Admit: 2023-04-07 | Discharge: 2023-04-07 | Disposition: A | Discharge status: Home / Self Care | Payer: 59 | Source: Ambulatory Visit | Attending: Adult Health | Admitting: Adult Health

## 2023-04-07 DIAGNOSIS — I10 Essential (primary) hypertension: Secondary | ICD-10-CM | POA: Diagnosis not present

## 2023-04-07 DIAGNOSIS — Z170 Estrogen receptor positive status [ER+]: Secondary | ICD-10-CM | POA: Diagnosis not present

## 2023-04-07 DIAGNOSIS — C50411 Malignant neoplasm of upper-outer quadrant of right female breast: Secondary | ICD-10-CM | POA: Diagnosis not present

## 2023-04-07 DIAGNOSIS — E785 Hyperlipidemia, unspecified: Secondary | ICD-10-CM | POA: Insufficient documentation

## 2023-04-07 DIAGNOSIS — Z0189 Encounter for other specified special examinations: Secondary | ICD-10-CM

## 2023-04-07 DIAGNOSIS — C7951 Secondary malignant neoplasm of bone: Secondary | ICD-10-CM | POA: Diagnosis not present

## 2023-04-07 DIAGNOSIS — Z01818 Encounter for other preprocedural examination: Secondary | ICD-10-CM | POA: Diagnosis not present

## 2023-04-07 DIAGNOSIS — G809 Cerebral palsy, unspecified: Secondary | ICD-10-CM | POA: Diagnosis not present

## 2023-04-07 LAB — ECHOCARDIOGRAM COMPLETE
Area-P 1/2: 4.06 cm2
Calc EF: 65.6 %
S' Lateral: 2.4 cm
Single Plane A2C EF: 67.4 %
Single Plane A4C EF: 62.5 %

## 2023-04-07 NOTE — Progress Notes (Signed)
  Echocardiogram 2D Echocardiogram has been performed.  [name] 04/07/2023, 10:46 AM

## 2023-04-15 ENCOUNTER — Other Ambulatory Visit: Payer: Self-pay

## 2023-04-24 ENCOUNTER — Encounter: Payer: Self-pay | Admitting: Family Medicine

## 2023-04-30 ENCOUNTER — Inpatient Hospital Stay: Payer: 59

## 2023-04-30 ENCOUNTER — Encounter: Payer: Self-pay | Admitting: Adult Health

## 2023-04-30 ENCOUNTER — Inpatient Hospital Stay: Payer: 59 | Attending: Hematology and Oncology | Admitting: Adult Health

## 2023-04-30 VITALS — BP 121/82 | HR 83 | Temp 98.3°F | Resp 18 | Ht 62.0 in | Wt 120.3 lb

## 2023-04-30 DIAGNOSIS — C7951 Secondary malignant neoplasm of bone: Secondary | ICD-10-CM | POA: Insufficient documentation

## 2023-04-30 DIAGNOSIS — C50411 Malignant neoplasm of upper-outer quadrant of right female breast: Secondary | ICD-10-CM | POA: Diagnosis not present

## 2023-04-30 DIAGNOSIS — Z1731 Human epidermal growth factor receptor 2 positive status: Secondary | ICD-10-CM | POA: Diagnosis not present

## 2023-04-30 DIAGNOSIS — Z170 Estrogen receptor positive status [ER+]: Secondary | ICD-10-CM | POA: Diagnosis not present

## 2023-04-30 DIAGNOSIS — Z1722 Progesterone receptor negative status: Secondary | ICD-10-CM | POA: Diagnosis not present

## 2023-04-30 DIAGNOSIS — Z5112 Encounter for antineoplastic immunotherapy: Secondary | ICD-10-CM | POA: Insufficient documentation

## 2023-04-30 MED ORDER — ACETAMINOPHEN 325 MG PO TABS
650.0000 mg | ORAL_TABLET | Freq: Once | ORAL | Status: AC
  Administered 2023-04-30: 650 mg via ORAL
  Filled 2023-04-30: qty 2

## 2023-04-30 MED ORDER — SODIUM CHLORIDE 0.9 % IV SOLN: Freq: Once | INTRAVENOUS | Status: AC

## 2023-04-30 MED ORDER — TRASTUZUMAB-ANNS CHEMO 150 MG IV SOLR
6.0000 mg/kg | Freq: Once | INTRAVENOUS | Status: AC
  Administered 2023-04-30: 357 mg via INTRAVENOUS
  Filled 2023-04-30: qty 17

## 2023-04-30 MED ORDER — DIPHENHYDRAMINE HCL 25 MG PO CAPS
50.0000 mg | ORAL_CAPSULE | Freq: Once | ORAL | Status: AC
  Administered 2023-04-30: 50 mg via ORAL
  Filled 2023-04-30: qty 2

## 2023-04-30 NOTE — Progress Notes (Signed)
[facility] Cancer Follow up:    Sherri Ip, DO [address] [place] [address]   DIAGNOSIS:  Cancer Staging  Malignant neoplasm of upper-outer quadrant of right breast in female, estrogen receptor positive (HCC) Staging form: Breast, AJCC 8th Edition - Clinical: Stage IIA (cT2, cN0, cM0, G1, ER+, PR-, HER2+) - Unsigned Method of lymph node assessment: Clinical Histologic grading system: 3 grade system Laterality: Right Tumor size (mm): 21 - Pathologic stage from 05/11/2018: Stage IIIB (pT2, pN3, cM0, G3, ER+, PR-, HER2+) - Signed by Serena Croissant, MD on 05/11/2018 Neoadjuvant therapy: No Multigene prognostic tests performed: None Histologic grading system: 3 grade system   SUMMARY OF ONCOLOGIC HISTORY: Oncology History  Malignant neoplasm of upper-outer quadrant of right breast in female, estrogen receptor positive (HCC)  03/18/2018 Initial Diagnosis   Screening detected bilateral breast abnormalities.  Right breast mass UOQ 2.1 cm at 10 o'clock position: Grade 1 ILC ER 70%, PR 0%, HER-2 +3+ by IHC, Ki-67 5%, suspected lymph node in the axilla could not be biopsied; left breast calcifications by ultrasound not visible but 5 cm cystic/tubular structure which on biopsy was fibrocystic change with ADHl; T2N0 stage IIa clinical stage   05/01/2018 Surgery   Bilateral mastectomies: Left mastectomy: DCIS intermediate grade 0.9 cm margins negative Tis NX stage 0; right mastectomy: ILC, grade 3, 3.5 cm, with LCIS, perineural invasion present, margins negative, 15/22 lymph nodes positive with extracapsular extension, ER 90%, PR 0%, HER-2 +(IHC 3+), Ki-67 5%, T2N3 Stage 3B    05/11/2018 Cancer Staging   Staging form: Breast, AJCC 8th Edition - Pathologic stage from 05/11/2018: Stage IIIB (pT2, pN3, cM0, G3, ER+, PR-, HER2+) - Signed by Serena Croissant, MD on 05/11/2018   05/2018 -  Anti-estrogen oral therapy   Tamoxifen daily   06/03/2018 Imaging   Bone scan:  Increased tracer uptake in the lumbar spine at L1, L3 and L4, right fourth and seventh ribs, inferior right scapula and distal sternum, T7 and T11 as well.   CT CAP showed widespread bone metastases with possible pathologic fracture at L3, peripheral right upper lobe nodules nonspecific   06/10/2018 -  Chemotherapy   Taxol Kingenti and Perjeta    10/27/2018 PET scan   Bone metastases less metabolically active.  Couple of lymph nodes with low-level metabolic activity noted.   07/10/2022 - 07/10/2022 Chemotherapy   Patient is on Treatment Plan : BREAST Trastuzumab IV (8/6) or SQ (600) D1 q21d     08/21/2022 -  Chemotherapy   Patient is on Treatment Plan : BREAST MAINTENANCE Trastuzumab IV (6) or SQ (600) D1 q42d x 13 cycles     Malignant neoplasm metastatic to bone (HCC)  06/04/2018 Initial Diagnosis   Malignant neoplasm metastatic to bone (HCC)   07/10/2022 - 07/10/2022 Chemotherapy   Patient is on Treatment Plan : BREAST Trastuzumab IV (8/6) or SQ (600) D1 q21d       CURRENT THERAPY: Herceptin/tamoxifen  INTERVAL HISTORY:  Discussed the use of AI scribe software for clinical note transcription with the patient, who gave verbal consent to proceed.  [name] 57 y.o. female  with metastatic breast cancer, is currently on Herceptin and Tamoxifen. The most recent echocardiogram, conducted in November 2024, showed a well-preserved ejection fraction. The latest CT scan, performed in August 2024, showed no evidence of new or progressive disease.  The patient reports no new health issues and is tolerating the current medications well. The patient's ejection fraction remains stable,  and there are no indications for early scans.  Patient Active Problem List   Diagnosis Date Noted   Goals of care, counseling/discussion 09/14/2018   Port-A-Cath in place 06/17/2018   Malignant neoplasm metastatic to bone (HCC) 06/04/2018   Breast cancer, stage 2, right (HCC) 05/01/2018   Malignant neoplasm of  upper-outer quadrant of right breast in female, estrogen receptor positive (HCC) 03/23/2018   Pre-diabetes 12/24/2016   Healthcare maintenance 12/26/2014   Headache 12/26/2014   Frequency of micturition 09/03/2013   Need for Tdap vaccination 09/03/2013   Pain in limb 04/12/2013   Wart viral 04/12/2013   HLD (hyperlipidemia) 09/16/2012   Vitamin D deficiency 09/16/2012   Hyperglycemia 09/16/2012   HTN (hypertension) 09/16/2012   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  MEDICAL HISTORY: Past Medical History:  Diagnosis Date   Arthritis    Cerebral palsy (HCC)    Complication of anesthesia 12/09/2013   Respiratory instability with 10 mg Propofol.Poor gag reflex.   Constipation - functional    Dysphagia 2003   2O to GERD/HH, ?difficulty with large food bolus due to neuromuscular weakness   Epigastric pain MAY 2012 ABD [place] NL GBLIVPAN   2o to GERD V. NON-ULCER DYSPEPSIA   GERD (gastroesophageal reflux disease) 2007   BPE NL ESO, REFLUX   Hiatal hernia    HIATAL HERNIA 11/23/2008   Qualifier: Diagnosis of  By: Diana Eves     HTN (hypertension)    Hyperlipidemia     SURGICAL HISTORY: Past Surgical History:  Procedure Laterality Date   COLONOSCOPY  APR 2009 with proprofol   SLF: SIMPLE ADENOMAS   MASTECTOMY W/ SENTINEL NODE BIOPSY Bilateral 05/01/2018   Procedure: BILATERAL MASTECTOMIES WITH LEFT SENTINEL LYMPH NODE BIOPSIES AND RIGHT AXILLARY LYMPH NODE DISSECTION;  Surgeon: Ovidio Kin, MD;  Location: [facility] OR;  Service: General;  Laterality: Bilateral;   PORTACATH PLACEMENT N/A 05/01/2018   Procedure: INSERTION PORT-A-CATH WITH [place];  Surgeon: Ovidio Kin, MD;  Location: [facility] OR;  Service: General;  Laterality: N/A;   UPPER GASTROINTESTINAL ENDOSCOPY  MAY 2012 DIL 16 mm   SLF:  NO DEFINITE STRICTURE APPRECIATED   UPPER GASTROINTESTINAL ENDOSCOPY  FEB 2009-HYPOXIA REQUIRING NARCAN, & VERSED, D50V4   SLF: NL ESO, FUNDIC GLAND POLYPS   UPPER GASTROINTESTINAL ENDOSCOPY  w/ DIL 2003, 2004, 2006    SOCIAL HISTORY: Social History   Socioeconomic History   Marital status: Single    Spouse name: Not on file   Number of children: Not on file   Years of education: Not on file   Highest education level: Not on file  Occupational History   Not on file  Tobacco Use   Smoking status: Never   Smokeless tobacco: Never  Substance and Sexual Activity   Alcohol use: No   Drug use: Yes    Types: PCP   Sexual activity: Not Currently    Birth control/protection: None  Other Topics Concern   Not on file  Social History Narrative   Not on file   Social Drivers of Health   Financial Resource Strain: Low Risk  (11/06/2022)   Overall Financial Resource Strain (CARDIA)    Difficulty of Paying Living Expenses: Not hard at all  Food Insecurity: No Food Insecurity (11/06/2022)   Hunger Vital Sign    Worried About  Running Out of Food in the Last Year: Never true    Ran Out of Food in the Last Year: Never true  Transportation Needs: No Transportation Needs (11/06/2022)   PRAPARE - Administrator, Civil Service (Medical): No    Lack of Transportation (Non-Medical): No  Physical Activity: Insufficiently Active (11/06/2022)   Exercise Vital Sign    Days of Exercise per Week: 3 days    Minutes of Exercise per Session: 30 min  Stress: No Stress Concern Present (11/06/2022)   [employer] of Occupational Health - Occupational Stress Questionnaire    Feeling of Stress : Only a little  Social Connections: Moderately Isolated (11/06/2022)   Social Connection and Isolation Panel [NHANES]    Frequency of Communication with Friends and Family: More than three times a week    Frequency of Social Gatherings with Friends and Family: More than three times a week    Attends  Religious Services: 1 to 4 times per year    Active Member of [employer] or Organizations: No    Attends Banker Meetings: Never    Marital Status: Never married  Intimate Partner Violence: Not At Risk (11/06/2022)   Humiliation, Afraid, Rape, and Kick questionnaire    Fear of Current or Ex-Partner: No    Emotionally Abused: No    Physically Abused: No    Sexually Abused: No    FAMILY HISTORY: Family History  Problem Relation Age of Onset   Hypertension Mother    Diabetes Mother    Colon cancer Mother        LATE 50S   Hypertension Brother    Diabetes Brother    Diabetes Maternal Grandmother    Diabetes Maternal Grandfather    Hypertension Maternal Grandfather     Review of Systems  Constitutional:  Negative for appetite change, chills, fatigue, fever and unexpected weight change.  HENT:   Negative for hearing loss, lump/mass and trouble swallowing.   Eyes:  Negative for eye problems and icterus.  Respiratory:  Negative for chest tightness, cough and shortness of breath.   Cardiovascular:  Negative for chest pain, leg swelling and palpitations.  Gastrointestinal:  Negative for abdominal distention, abdominal pain, constipation, diarrhea, nausea and vomiting.  Endocrine: Negative for hot flashes.  Genitourinary:  Negative for difficulty urinating.   Musculoskeletal:  Negative for arthralgias.  Skin:  Negative for itching and rash.  Neurological:  Negative for dizziness, extremity weakness, headaches and numbness.  Hematological:  Negative for adenopathy. Does not bruise/bleed easily.  Psychiatric/Behavioral:  Negative for depression. The patient is not nervous/anxious.       PHYSICAL EXAMINATION   Onc Performance Status - 04/30/23 0900       KPS SCALE   KPS % SCORE Requires considerable assistance, and frequent medical care             Vitals:   04/30/23 0900  BP: 121/82  Pulse: 83  Resp: 18  Temp: 98.3 F (36.8 C)  SpO2: 95%    Physical  Exam Constitutional:      General: She is not in acute distress.    Appearance: Normal appearance. She is not toxic-appearing.     Comments: Examined in wheelchair  HENT:     Head: Normocephalic and atraumatic.     Mouth/Throat:     Mouth: Mucous membranes are moist.     Pharynx: Oropharynx is clear. No oropharyngeal exudate or posterior oropharyngeal erythema.  Eyes:     General: No scleral  icterus. Cardiovascular:     Rate and Rhythm: Normal rate and regular rhythm.     Pulses: Normal pulses.     Heart sounds: Normal heart sounds.  Pulmonary:     Effort: Pulmonary effort is normal.     Breath sounds: Normal breath sounds.  Abdominal:     General: Abdomen is flat. Bowel sounds are normal. There is no distension.     Palpations: Abdomen is soft.     Tenderness: There is no abdominal tenderness.  Musculoskeletal:        General: No swelling.     Cervical back: Neck supple.  Lymphadenopathy:     Cervical: No cervical adenopathy.  Skin:    General: Skin is warm and dry.     Findings: No rash.  Neurological:     General: No focal deficit present.     Mental Status: She is alert.     Comments: H/o cerebral palsy, neuro is at patient's baseline   Psychiatric:        Mood and Affect: Mood normal.        Behavior: Behavior normal.     Comments: H/o cerebral palsy, behavior and mood are at patient's baseline        ASSESSMENT and THERAPY PLAN:   Malignant neoplasm of upper-outer quadrant of right breast in female, estrogen receptor positive (HCC) 05/01/18: Bilateral mastectomies: Left mastectomy: DCIS intermediate grade 0.9 cm margins negative Tis NX stage 0; right mastectomy: ILC, grade 3, 3.5 cm, with LCIS, perineural invasion present, margins negative, 15/22 lymph nodes positive with extracapsular extension, ER 90%, PR 0%, HER-2 +(IHC 3+), Ki-67 5%, T2N3M1 Stage IV   06/03/2018 bone scan: Increased tracer uptake in the lumbar spine at L1, L3 and L4, right fourth and  seventh ribs, inferior right scapula and distal sternum, T7 and T11 as well.   CT CAP showed widespread bone metastases with possible pathologic fracture at L3, peripheral right upper lobe nodules nonspecific Patient has cerebral palsy and her sister is the power of attorney for healthcare Treatment plan: Palliative treatment with Taxol x12 cycles completed 08/26/2018, Kingenti, Perjeta ------------------------------------------------------------------------------------------------------------------------ PET CT scan: 10/27/2018: Widespread bone metastatic disease demonstrates reduced with sclerosis and low metabolic activity with SUV of 3-4.  Left level 4 lymph node 0.5 cm and left deep parotid lymph node SUV 4.1 will need follow-up.  Uterine leiomyomas. PET/CT 11/11/2019: Continued mild hypermetabolic activity left supraclavicular lymph node 0.7 cm, old sclerotic bone lesions are stable and not hypermetabolic.  Uterine fibroids   CT CAP 05/27/2022: Similarly treated bone metastases.  Pulmonary nodules felt to be similar CT CAP 12/23/2022: Stable findings without any new or progressive disease.  Stable treated bone metastases.  Stable lung nodules favored to be benign.  Uterine fibroid Current treatment: Herceptin (biosimilar) maintenance along with Tamoxifen  Tamoxifen toxicities: Denies any hot flashes or myalgias. Herceptin toxicities: None   Metastatic Breast Cancer Stable on Herceptin and Tamoxifen. No new or progressive disease on most recent CT scan (12/23/2022). Ejection fraction preserved on most recent echo (04/07/2023). No new symptoms reported. -Continue Herceptin and Tamoxifen as scheduled, patient is tolerating well. -Next CT scan due in February 2025. -Herceptin infusions to continue every six weeks, with provider visits every other infusion.   All questions were answered. The patient knows to call the clinic with any problems, questions or concerns. We can certainly see the patient  much sooner if necessary.  Total encounter time:20 minutes*in face-to-face visit time, chart review, lab review, care coordination,  order entry, and documentation of the encounter time.    Lillard Anes, NP 04/30/23 11:04 AM Medical Oncology and Hematology [facility] [address] [place], [place] [address] Tel. [phone]    Fax. [phone]  *Total Encounter Time as defined by the Centers for Medicare and Medicaid Services includes, in addition to the face-to-face time of a patient visit (documented in the note above) non-face-to-face time: obtaining and reviewing outside history, ordering and reviewing medications, tests or procedures, care coordination (communications with other health care professionals or caregivers) and documentation in the medical record.

## 2023-04-30 NOTE — Assessment & Plan Note (Addendum)
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   CT CAP showed widespread bone metastases with possible pathologic fracture at L3, peripheral right upper lobe nodules nonspecific Patient has cerebral palsy and her sister is the power of attorney for healthcare Treatment plan: Palliative treatment with Taxol x12 cycles completed 08/26/2018, Kingenti, Perjeta ------------------------------------------------------------------------------------------------------------------------ PET CT scan: 10/27/2018: Widespread bone metastatic disease demonstrates reduced with sclerosis and low metabolic activity with SUV of 3-4.  Left level 4 lymph node 0.5 cm and left deep parotid lymph node SUV 4.1 will need follow-up.  Uterine leiomyomas. PET/CT 11/11/2019: Continued mild hypermetabolic activity left supraclavicular lymph node 0.7 cm, old sclerotic bone lesions are stable and not hypermetabolic.  Uterine fibroids   CT CAP 05/27/2022: Similarly treated bone metastases.  Pulmonary nodules felt to be similar CT CAP 12/23/2022: Stable findings without any new or progressive disease.  Stable treated bone metastases.  Stable lung nodules favored to be benign.  Uterine fibroid Current treatment: Herceptin (biosimilar) maintenance along with Tamoxifen  Tamoxifen toxicities: Denies any hot flashes or myalgias. Herceptin toxicities: None   Metastatic Breast Cancer Stable on Herceptin and Tamoxifen. No new or progressive disease on most recent CT scan (12/23/2022). Ejection  fraction preserved on most recent echo (04/07/2023). No new symptoms reported. -Continue Herceptin and Tamoxifen as scheduled, patient is tolerating well. -Next CT scan due in February 2025. -Herceptin infusions to continue every six weeks, with provider visits every other infusion.

## 2023-05-27 ENCOUNTER — Other Ambulatory Visit: Payer: Self-pay | Admitting: Family Medicine

## 2023-05-27 ENCOUNTER — Other Ambulatory Visit: Payer: Self-pay | Admitting: Internal Medicine

## 2023-05-27 NOTE — Telephone Encounter (Signed)
 This is managed by her cardiologist. I'll cc as FYI, pt was just seen in Nov so I suspect she still wants her on this.

## 2023-05-29 ENCOUNTER — Other Ambulatory Visit: Payer: Self-pay | Admitting: Hematology and Oncology

## 2023-06-03 ENCOUNTER — Other Ambulatory Visit: Payer: Self-pay | Admitting: Hematology and Oncology

## 2023-06-11 ENCOUNTER — Inpatient Hospital Stay: Payer: 59 | Attending: Hematology and Oncology

## 2023-06-11 VITALS — BP 132/76 | HR 86 | Temp 98.0°F | Resp 16 | Wt 118.0 lb

## 2023-06-11 DIAGNOSIS — Z1722 Progesterone receptor negative status: Secondary | ICD-10-CM | POA: Insufficient documentation

## 2023-06-11 DIAGNOSIS — C50411 Malignant neoplasm of upper-outer quadrant of right female breast: Secondary | ICD-10-CM | POA: Diagnosis not present

## 2023-06-11 DIAGNOSIS — C7951 Secondary malignant neoplasm of bone: Secondary | ICD-10-CM | POA: Insufficient documentation

## 2023-06-11 DIAGNOSIS — Z1731 Human epidermal growth factor receptor 2 positive status: Secondary | ICD-10-CM | POA: Insufficient documentation

## 2023-06-11 DIAGNOSIS — Z79810 Long term (current) use of selective estrogen receptor modulators (SERMs): Secondary | ICD-10-CM | POA: Diagnosis not present

## 2023-06-11 DIAGNOSIS — Z170 Estrogen receptor positive status [ER+]: Secondary | ICD-10-CM | POA: Insufficient documentation

## 2023-06-11 DIAGNOSIS — Z5112 Encounter for antineoplastic immunotherapy: Secondary | ICD-10-CM | POA: Insufficient documentation

## 2023-06-11 MED ORDER — ACETAMINOPHEN 325 MG PO TABS
650.0000 mg | ORAL_TABLET | Freq: Once | ORAL | Status: AC
Start: 2023-06-11 — End: 2023-06-11
  Administered 2023-06-11: 650 mg via ORAL
  Filled 2023-06-11: qty 2

## 2023-06-11 MED ORDER — TRASTUZUMAB-ANNS CHEMO 150 MG IV SOLR
6.0000 mg/kg | Freq: Once | INTRAVENOUS | Status: AC
  Administered 2023-06-11: 357 mg via INTRAVENOUS
  Filled 2023-06-11: qty 17

## 2023-06-11 MED ORDER — SODIUM CHLORIDE 0.9 % IV SOLN: Freq: Once | INTRAVENOUS | Status: AC

## 2023-06-11 MED ORDER — DIPHENHYDRAMINE HCL 25 MG PO CAPS
50.0000 mg | ORAL_CAPSULE | Freq: Once | ORAL | Status: AC
Start: 2023-06-11 — End: 2023-06-11
  Administered 2023-06-11: 50 mg via ORAL
  Filled 2023-06-11: qty 2

## 2023-06-11 NOTE — Patient Instructions (Signed)

## 2023-06-14 ENCOUNTER — Other Ambulatory Visit: Payer: Self-pay | Admitting: Internal Medicine

## 2023-06-16 ENCOUNTER — Other Ambulatory Visit: Payer: Self-pay | Admitting: Family Medicine

## 2023-06-16 NOTE — Telephone Encounter (Signed)
Copied from CRM [id number]. Topic: Clinical - Medication Refill >> Jun 16, 2023  9:57 AM Carlatta H wrote: Most Recent Primary Care Visit:  Provider: [name]  Department: [facility]  Visit Type: OFFICE VISIT  Date: 02/10/2023  Medication: carvedilol (COREG) 3.125 MG tablet [[id number]]  Has the patient contacted their pharmacy? Yes (Agent: If no, request that the patient contact the pharmacy for the refill. If patient does not wish to contact the pharmacy document the reason why and proceed with request.) (Agent: If yes, when and what did the pharmacy advise?) Advised of contact doctor   Is this the correct pharmacy for this prescription? Yes If no, delete pharmacy and type the correct one.  This is the patient's preferred pharmacy:   [id number] - [place], [place] - [address] AT [facility] [address] [place] [address] Phone: [phone] Fax: [phone]   Has the prescription been filled recently? No  Is the patient out of the medication? Yes  Has the patient been seen for an appointment in the last year OR does the patient have an upcoming appointment? Yes  Can we respond through MyChart? Yes  Agent: Please be advised that Rx refills may take up to 3 business days. We ask that you follow-up with your pharmacy.

## 2023-06-23 NOTE — Patient Instructions (Addendum)
Plan for fasting labs at your physical in 6 months.

## 2023-06-24 ENCOUNTER — Ambulatory Visit (INDEPENDENT_AMBULATORY_CARE_PROVIDER_SITE_OTHER): Payer: 59 | Admitting: Family Medicine

## 2023-06-24 ENCOUNTER — Encounter: Payer: Self-pay | Admitting: Family Medicine

## 2023-06-24 VITALS — BP 138/82 | HR 95 | Temp 98.3°F | Ht 62.0 in

## 2023-06-24 DIAGNOSIS — G809 Cerebral palsy, unspecified: Secondary | ICD-10-CM

## 2023-06-24 DIAGNOSIS — E1159 Type 2 diabetes mellitus with other circulatory complications: Secondary | ICD-10-CM | POA: Diagnosis not present

## 2023-06-24 DIAGNOSIS — Z170 Estrogen receptor positive status [ER+]: Secondary | ICD-10-CM

## 2023-06-24 DIAGNOSIS — E1169 Type 2 diabetes mellitus with other specified complication: Secondary | ICD-10-CM

## 2023-06-24 DIAGNOSIS — Z7984 Long term (current) use of oral hypoglycemic drugs: Secondary | ICD-10-CM

## 2023-06-24 DIAGNOSIS — E119 Type 2 diabetes mellitus without complications: Secondary | ICD-10-CM

## 2023-06-24 DIAGNOSIS — E785 Hyperlipidemia, unspecified: Secondary | ICD-10-CM | POA: Diagnosis not present

## 2023-06-24 DIAGNOSIS — C50411 Malignant neoplasm of upper-outer quadrant of right female breast: Secondary | ICD-10-CM | POA: Diagnosis not present

## 2023-06-24 DIAGNOSIS — I152 Hypertension secondary to endocrine disorders: Secondary | ICD-10-CM | POA: Diagnosis not present

## 2023-06-24 LAB — LIPID PANEL
Chol/HDL Ratio: 2.9 {ratio} (ref 0.0–4.4)
Cholesterol, Total: 165 mg/dL (ref 100–199)
HDL: 56 mg/dL (ref >39)
LDL Chol Calc (NIH): 94 mg/dL (ref 0–99)
Triglycerides: 82 mg/dL (ref 0–149)
VLDL Cholesterol Cal: 15 mg/dL (ref 5–40)

## 2023-06-24 LAB — BAYER DCA HB A1C WAIVED: HB A1C (BAYER DCA - WAIVED): 6.8 % — ABNORMAL HIGH (ref 4.8–5.6)

## 2023-06-24 LAB — CMP14+EGFR
ALT: 13 [IU]/L (ref 0–32)
AST: 17 [IU]/L (ref 0–40)
Albumin: 3.9 g/dL (ref 3.8–4.9)
Alkaline Phosphatase: 58 [IU]/L (ref 44–121)
BUN/Creatinine Ratio: 18 (ref 12–28)
BUN: 12 mg/dL (ref 8–27)
Bilirubin Total: <0.2 mg/dL (ref 0.0–1.2)
CO2: 21 mmol/L (ref 20–29)
Calcium: 9.2 mg/dL (ref 8.7–10.3)
Chloride: 111 mmol/L — ABNORMAL HIGH (ref 96–106)
Creatinine, Ser: 0.68 mg/dL (ref 0.57–1.00)
Globulin, Total: 3.2 g/dL (ref 1.5–4.5)
Glucose: 131 mg/dL — ABNORMAL HIGH (ref 70–99)
Potassium: 3.9 mmol/L (ref 3.5–5.2)
Sodium: 145 mmol/L — ABNORMAL HIGH (ref 134–144)
Total Protein: 7.1 g/dL (ref 6.0–8.5)
eGFR: 100 mL/min/{1.73_m2} (ref >59)

## 2023-06-24 MED ORDER — LOSARTAN POTASSIUM 50 MG PO TABS: 50.0000 mg | ORAL_TABLET | Freq: Every day | ORAL | 3 refills | Status: DC

## 2023-06-24 MED ORDER — DAPAGLIFLOZIN PROPANEDIOL 10 MG PO TABS
10.0000 mg | ORAL_TABLET | Freq: Every day | ORAL | 3 refills | Status: DC
Start: 2023-06-24 — End: 2024-01-21

## 2023-06-24 MED ORDER — SITAGLIPTIN PHOSPHATE 50 MG PO TABS
50.0000 mg | ORAL_TABLET | Freq: Every day | ORAL | 3 refills | Status: DC
Start: 2023-06-24 — End: 2024-01-21

## 2023-06-24 MED ORDER — PRAVASTATIN SODIUM 40 MG PO TABS: 40.0000 mg | ORAL_TABLET | Freq: Every day | ORAL | 3 refills | Status: DC

## 2023-06-24 MED ORDER — CARVEDILOL 3.125 MG PO TABS: 3.1250 mg | ORAL_TABLET | Freq: Two times a day (BID) | ORAL | 3 refills | Status: DC

## 2023-06-24 NOTE — Progress Notes (Signed)
Subjective: CC:DM PCP: [name], DO [name] is a 58 y.o. female presenting to clinic today for:  1. Type 2 Diabetes with hypertension, hyperlipidemia:  Patient is accompanied today visit by her sister.  They have been monitoring her blood sugars daily.  No hypoglycemic episodes reported.  She is compliant with Farxiga, Januvia, losartan and Pravachol.  Diabetes Health Maintenance Due  Topic Date Due   OPHTHALMOLOGY EXAM  Never done   HEMOGLOBIN A1C  08/10/2023   FOOT EXAM  10/09/2023    Last A1c:  Lab Results  Component Value Date   HGBA1C 6.6 (H) 02/10/2023    ROS: No dizziness, LOC, polyuria, polydipsia, unintended weight loss/gain, foot ulcerations, numbness or tingling in extremities, shortness of breath or chest pain.  No vaginitis or dysuria.  2.  Breast cancer Patient with history of double mastectomy.  Currently getting infusions.  Has imaging study coming up on the 24th of the month of the pelvis.  No complaints today.  Appetites been good.  Has excellent support by her family members and sister who is here today  ROS: Per HPI  Allergies  Allergen Reactions   Hydrocodone Nausea And Vomiting and Other (See Comments)    Vomiting and hallunications   Past Medical History:  Diagnosis Date   Arthritis    Cerebral palsy (HCC)    Complication of anesthesia 12/09/2013   Respiratory instability with 10 mg Propofol.Poor gag reflex.   Constipation - functional    Dysphagia 2003   2O to GERD/HH, ?difficulty with large food bolus due to neuromuscular weakness   Epigastric pain MAY 2012 ABD [place] NL GBLIVPAN   2o to GERD V. NON-ULCER DYSPEPSIA   GERD (gastroesophageal reflux disease) 2007   BPE NL ESO, REFLUX   Hiatal hernia    HIATAL HERNIA 11/23/2008   Qualifier: Diagnosis of  By: Diana Eves     HTN (hypertension)    Hyperlipidemia     Current Outpatient Medications:    Accu-Chek Softclix Lancets lancets, SMARTSIG:Topical 1-2 Times Daily,  Disp: , Rfl:    Blood Glucose Monitoring Suppl (ACCU-CHEK GUIDE) w/Device KIT, PLEASE SEE ATTACHED FOR DETAILED DIRECTIONS, Disp: , Rfl:    Blood Glucose Monitoring Suppl DEVI, Check BGs up to twice daily.  E11.9 Wants One TOuch May substitute to any manufacturer covered by patient's insurance., Disp: 1 each, Rfl: 0   carvedilol (COREG) 3.125 MG tablet, TAKE 1 TABLET BY MOUTH 2 TIMES DAILY., Disp: 180 tablet, Rfl: 2   dapagliflozin propanediol (FARXIGA) 10 MG TABS tablet, Take 1 tablet (10 mg total) by mouth daily before breakfast., Disp: 90 tablet, Rfl: 3   Glucose Blood (BLOOD GLUCOSE TEST STRIPS) STRP, Check BGs up to twice daily. E11.9 One touch. May substitute to any manufacturer covered by patient's insurance., Disp: 100 strip, Rfl: PRN   Lancet Device MISC, Check BGs daily as directed. E11.9. May substitute to any manufacturer covered by patient's insurance., Disp: 1 each, Rfl: 0   Lancets Misc. (ACCU-CHEK SOFTCLIX LANCET DEV) KIT, PLEASE SEE ATTACHED FOR DETAILED DIRECTIONS, Disp: , Rfl:    Lancets Misc. MISC, Check BGs up to twice daily. E11.9 May substitute to any manufacturer covered by patient's insurance., Disp: 100 each, Rfl: PRN   losartan (COZAAR) 50 MG tablet, Take 1 tablet (50 mg total) by mouth daily., Disp: 90 tablet, Rfl: 3   megestrol (MEGACE) 40 MG tablet, TAKE 1 TABLET (40 MG TOTAL) BY MOUTH 2 (TWO) TIMES DAILY. TAKE 3 TABLETS EVERY DAY,  Disp: 270 tablet, Rfl: 2   Multiple Vitamin (MULTI-VITAMIN) tablet, Take 1 tablet by mouth daily., Disp: , Rfl:    nystatin cream (MYCOSTATIN), Apply 1 Application topically 2 (two) times daily. To area between breast for rash x7-14 days, Disp: 30 g, Rfl: 0   pantoprazole (PROTONIX) 40 MG tablet, TAKE 1 TABLET BY MOUTH 30 MINUTES PRIOR TO BREAKFAST AND SUPPER,, Disp: 180 tablet, Rfl: 3   potassium chloride (KLOR-CON) 10 MEQ tablet, TAKE 1 TABLET BY MOUTH EVERY DAY, Disp: 90 tablet, Rfl: 1   pravastatin (PRAVACHOL) 40 MG tablet, Take 1 tablet  (40 mg total) by mouth daily., Disp: 90 tablet, Rfl: 3   sitaGLIPtin (JANUVIA) 50 MG tablet, Take 1 tablet (50 mg total) by mouth daily. Take with [name] daily for sugar., Disp: 90 tablet, Rfl: 3   tamoxifen (NOLVADEX) 20 MG tablet, TAKE 1 TABLET BY MOUTH EVERY DAY, Disp: 90 tablet, Rfl: 3 Social History   Socioeconomic History   Marital status: Single    Spouse name: Not on file   Number of children: Not on file   Years of education: Not on file   Highest education level: Not on file  Occupational History   Not on file  Tobacco Use   Smoking status: Never   Smokeless tobacco: Never  Substance and Sexual Activity   Alcohol use: No   Drug use: Yes    Types: PCP   Sexual activity: Not Currently    Birth control/protection: None  Other Topics Concern   Not on file  Social History Narrative   Not on file   Social Drivers of Health   Financial Resource Strain: Low Risk  (11/06/2022)   Overall Financial Resource Strain (CARDIA)    Difficulty of Paying Living Expenses: Not hard at all  Food Insecurity: No Food Insecurity (11/06/2022)   Hunger Vital Sign    Worried About Running Out of Food in the Last Year: Never true    Ran Out of Food in the Last Year: Never true  Transportation Needs: No Transportation Needs (11/06/2022)   PRAPARE - Administrator, Civil Service (Medical): No    Lack of Transportation (Non-Medical): No  Physical Activity: Insufficiently Active (11/06/2022)   Exercise Vital Sign    Days of Exercise per Week: 3 days    Minutes of Exercise per Session: 30 min  Stress: No Stress Concern Present (11/06/2022)   [employer] of Occupational Health - Occupational Stress Questionnaire    Feeling of Stress : Only a little  Social Connections: Moderately Isolated (11/06/2022)   Social Connection and Isolation Panel [NHANES]    Frequency of Communication with Friends and Family: More than three times a week    Frequency of Social Gatherings with Friends  and Family: More than three times a week    Attends Religious Services: 1 to 4 times per year    Active Member of [employer] or Organizations: No    Attends Banker Meetings: Never    Marital Status: Never married  Intimate Partner Violence: Not At Risk (11/06/2022)   Humiliation, Afraid, Rape, and Kick questionnaire    Fear of Current or Ex-Partner: No    Emotionally Abused: No    Physically Abused: No    Sexually Abused: No   Family History  Problem Relation Age of Onset   Hypertension Mother    Diabetes Mother    Colon cancer Mother        LATE 50S   Hypertension  Brother    Diabetes Brother    Diabetes Maternal Grandmother    Diabetes Maternal Grandfather    Hypertension Maternal Grandfather     Objective: Office vital signs reviewed. BP 138/82   Pulse 95   Temp 98.3 F (36.8 C)   Ht 5\' 2"  (1.575 m)   SpO2 95%   BMI 21.58 kg/m   Physical Examination:  General: Awake, alert, well nourished, No acute distress HEENT: sclera white, MMM Cardio: regular rate and rhythm, S1S2 heard, no murmurs appreciated Pulm: clear to auscultation bilaterally, no wheezes, rhonchi or rales; normal work of breathing on room air MSK: Arrives in wheelchair.  Hunched station.  Assessment/ Plan: 58 y.o. female   Diabetes mellitus treated with oral medication (HCC) - Plan: Microalbumin / creatinine urine ratio, Bayer DCA Hb A1c Waived, CMP14+EGFR, dapagliflozin propanediol (FARXIGA) 10 MG TABS tablet, sitaGLIPtin (JANUVIA) 50 MG tablet  Hypertension associated with diabetes (HCC) - Plan: CMP14+EGFR, carvedilol (COREG) 3.125 MG tablet, losartan (COZAAR) 50 MG tablet  Hyperlipidemia associated with type 2 diabetes mellitus (HCC) - Plan: CMP14+EGFR, Lipid Panel, pravastatin (PRAVACHOL) 40 MG tablet  Infantile cerebral palsy (HCC)  Malignant neoplasm of upper-outer quadrant of right breast in female, estrogen receptor positive (HCC)   Sugar under good control with A1c of 6.8 today.   No changes.  Refills have been sent.  Will see if we can get her diabetic eye exam results.  Pneumococcal vaccination administered  Blood pressure is controlled.  No changes.  Lipid, CMP collected today.  Pravachol renewed  Cerebral palsy chronic and stable.  She has excellent support.  No complaints today  Status post mastectomy bilaterally.  Continues to have surveillance and infusion treatment with next imaging study of the pelvis planned in 2 weeks.  [name], DO [facility] [phone]

## 2023-06-25 ENCOUNTER — Encounter: Payer: Self-pay | Admitting: Family Medicine

## 2023-06-25 ENCOUNTER — Other Ambulatory Visit: Payer: Self-pay

## 2023-06-25 LAB — MICROALBUMIN / CREATININE URINE RATIO
Creatinine, Urine: 40.3 mg/dL
Microalb/Creat Ratio: 1648 mg/g{creat} — ABNORMAL HIGH (ref 0–29)
Microalbumin, Urine: 664.1 ug/mL

## 2023-07-07 ENCOUNTER — Encounter (HOSPITAL_COMMUNITY): Payer: Self-pay

## 2023-07-07 ENCOUNTER — Ambulatory Visit (HOSPITAL_COMMUNITY)
Admission: RE | Admit: 2023-07-07 | Discharge: 2023-07-07 | Disposition: A | Discharge status: Home / Self Care | Payer: 59 | Source: Ambulatory Visit | Attending: Adult Health | Admitting: Adult Health

## 2023-07-07 DIAGNOSIS — C7951 Secondary malignant neoplasm of bone: Secondary | ICD-10-CM | POA: Insufficient documentation

## 2023-07-07 DIAGNOSIS — C50411 Malignant neoplasm of upper-outer quadrant of right female breast: Secondary | ICD-10-CM | POA: Insufficient documentation

## 2023-07-07 DIAGNOSIS — Z170 Estrogen receptor positive status [ER+]: Secondary | ICD-10-CM | POA: Insufficient documentation

## 2023-07-07 DIAGNOSIS — I700 Atherosclerosis of aorta: Secondary | ICD-10-CM | POA: Diagnosis not present

## 2023-07-07 DIAGNOSIS — C50911 Malignant neoplasm of unspecified site of right female breast: Secondary | ICD-10-CM | POA: Diagnosis not present

## 2023-07-07 DIAGNOSIS — N281 Cyst of kidney, acquired: Secondary | ICD-10-CM | POA: Diagnosis not present

## 2023-07-07 HISTORY — DX: Malignant (primary) neoplasm, unspecified: C80.1

## 2023-07-07 MED ORDER — HEPARIN SOD (PORK) LOCK FLUSH 100 UNIT/ML IV SOLN
500.0000 [IU] | Freq: Once | INTRAVENOUS | Status: AC
  Administered 2023-07-07: 500 [IU] via INTRAVENOUS

## 2023-07-07 MED ORDER — HEPARIN SOD (PORK) LOCK FLUSH 100 UNIT/ML IV SOLN
INTRAVENOUS | Status: AC
  Filled 2023-07-07: qty 5

## 2023-07-07 MED ORDER — IOHEXOL 300 MG/ML  SOLN
100.0000 mL | Freq: Once | INTRAMUSCULAR | Status: AC | PRN
  Administered 2023-07-07: 100 mL via INTRAVENOUS

## 2023-07-09 ENCOUNTER — Telehealth: Payer: Self-pay | Admitting: Hematology and Oncology

## 2023-07-09 NOTE — Telephone Encounter (Signed)
 Scheduled appointments per WQ. Talked with the patients sister Evette and she is aware of the made appointments for the patient.

## 2023-07-22 ENCOUNTER — Other Ambulatory Visit: Payer: Self-pay | Admitting: *Deleted

## 2023-07-22 DIAGNOSIS — Z170 Estrogen receptor positive status [ER+]: Secondary | ICD-10-CM

## 2023-07-23 ENCOUNTER — Inpatient Hospital Stay: Payer: 59 | Attending: Hematology and Oncology | Admitting: Hematology and Oncology

## 2023-07-23 ENCOUNTER — Inpatient Hospital Stay: Payer: 59

## 2023-07-23 VITALS — BP 128/62 | HR 89 | Temp 98.7°F | Resp 18 | Ht 62.0 in | Wt 123.5 lb

## 2023-07-23 DIAGNOSIS — Z1731 Human epidermal growth factor receptor 2 positive status: Secondary | ICD-10-CM | POA: Diagnosis not present

## 2023-07-23 DIAGNOSIS — Z5112 Encounter for antineoplastic immunotherapy: Secondary | ICD-10-CM | POA: Insufficient documentation

## 2023-07-23 DIAGNOSIS — C7951 Secondary malignant neoplasm of bone: Secondary | ICD-10-CM | POA: Diagnosis not present

## 2023-07-23 DIAGNOSIS — Z170 Estrogen receptor positive status [ER+]: Secondary | ICD-10-CM

## 2023-07-23 DIAGNOSIS — Z1722 Progesterone receptor negative status: Secondary | ICD-10-CM | POA: Diagnosis not present

## 2023-07-23 DIAGNOSIS — C50411 Malignant neoplasm of upper-outer quadrant of right female breast: Secondary | ICD-10-CM | POA: Diagnosis not present

## 2023-07-23 DIAGNOSIS — Z95828 Presence of other vascular implants and grafts: Secondary | ICD-10-CM

## 2023-07-23 LAB — CBC WITH DIFFERENTIAL (CANCER CENTER ONLY)
Abs Immature Granulocytes: 0.02 10*3/uL (ref 0.00–0.07)
Basophils Absolute: 0 10*3/uL (ref 0.0–0.1)
Basophils Relative: 0 %
Eosinophils Absolute: 0.1 10*3/uL (ref 0.0–0.5)
Eosinophils Relative: 1 %
HCT: 39.5 % (ref 36.0–46.0)
Hemoglobin: 13.2 g/dL (ref 12.0–15.0)
Immature Granulocytes: 0 %
Lymphocytes Relative: 25 %
Lymphs Abs: 2.1 10*3/uL (ref 0.7–4.0)
MCH: 30.8 pg (ref 26.0–34.0)
MCHC: 33.4 g/dL (ref 30.0–36.0)
MCV: 92.3 fL (ref 80.0–100.0)
Monocytes Absolute: 0.5 10*3/uL (ref 0.1–1.0)
Monocytes Relative: 6 %
Neutro Abs: 5.6 10*3/uL (ref 1.7–7.7)
Neutrophils Relative %: 68 %
Platelet Count: 298 10*3/uL (ref 150–400)
RBC: 4.28 MIL/uL (ref 3.87–5.11)
RDW: 13.4 % (ref 11.5–15.5)
WBC Count: 8.3 10*3/uL (ref 4.0–10.5)
nRBC: 0 % (ref 0.0–0.2)

## 2023-07-23 LAB — CMP (CANCER CENTER ONLY)
ALT: 13 U/L (ref 0–44)
AST: 13 U/L — ABNORMAL LOW (ref 15–41)
Albumin: 3.9 g/dL (ref 3.5–5.0)
Alkaline Phosphatase: 51 U/L (ref 38–126)
Anion gap: 6 (ref 5–15)
BUN: 17 mg/dL (ref 6–20)
CO2: 25 mmol/L (ref 22–32)
Calcium: 9 mg/dL (ref 8.9–10.3)
Chloride: 106 mmol/L (ref 98–111)
Creatinine: 0.62 mg/dL (ref 0.44–1.00)
GFR, Estimated: >60 mL/min (ref >60)
Glucose, Bld: 148 mg/dL — ABNORMAL HIGH (ref 70–99)
Potassium: 4.1 mmol/L (ref 3.5–5.1)
Sodium: 137 mmol/L (ref 135–145)
Total Bilirubin: 0.4 mg/dL (ref 0.0–1.2)
Total Protein: 7.6 g/dL (ref 6.5–8.1)

## 2023-07-23 MED ORDER — HEPARIN SOD (PORK) LOCK FLUSH 100 UNIT/ML IV SOLN
500.0000 [IU] | Freq: Once | INTRAVENOUS | Status: AC | PRN
  Administered 2023-07-23: 500 [IU]

## 2023-07-23 MED ORDER — TRASTUZUMAB-ANNS CHEMO 150 MG IV SOLR
6.0000 mg/kg | Freq: Once | INTRAVENOUS | Status: AC
  Administered 2023-07-23: 357 mg via INTRAVENOUS
  Filled 2023-07-23: qty 17

## 2023-07-23 MED ORDER — DIPHENHYDRAMINE HCL 25 MG PO CAPS
50.0000 mg | ORAL_CAPSULE | Freq: Once | ORAL | Status: AC
  Administered 2023-07-23: 50 mg via ORAL
  Filled 2023-07-23: qty 2

## 2023-07-23 MED ORDER — SODIUM CHLORIDE 0.9% FLUSH
10.0000 mL | INTRAVENOUS | Status: DC | PRN
  Administered 2023-07-23: 10 mL

## 2023-07-23 MED ORDER — ACETAMINOPHEN 325 MG PO TABS
650.0000 mg | ORAL_TABLET | Freq: Once | ORAL | Status: AC
  Administered 2023-07-23: 650 mg via ORAL
  Filled 2023-07-23: qty 2

## 2023-07-23 MED ORDER — SODIUM CHLORIDE 0.9 % IV SOLN: Freq: Once | INTRAVENOUS | Status: AC

## 2023-07-23 NOTE — Patient Instructions (Signed)
 [facility] - A DEPT OF [facility]  Discharge Instructions: Thank you for choosing [facility] to provide your oncology and hematology care.   If you have a lab appointment with the Cancer Center, please go directly to the Cancer Center and check in at the registration area.   Wear comfortable clothing and clothing appropriate for easy access to any Portacath or PICC line.   We strive to give you quality time with your provider. You may need to reschedule your appointment if you arrive late (15 or more minutes).  Arriving late affects you and other patients whose appointments are after yours.  Also, if you miss three or more appointments without notifying the office, you may be dismissed from the clinic at the provider's discretion.      For prescription refill requests, have your pharmacy contact our office and allow 72 hours for refills to be completed.    Today you received the following chemotherapy and/or immunotherapy agents: trastuzumab      To help prevent nausea and vomiting after your treatment, we encourage you to take your nausea medication as directed.  BELOW ARE SYMPTOMS THAT SHOULD BE REPORTED IMMEDIATELY: *FEVER GREATER THAN 100.4 F (38 C) OR HIGHER *CHILLS OR SWEATING *NAUSEA AND VOMITING THAT IS NOT CONTROLLED WITH YOUR NAUSEA MEDICATION *UNUSUAL SHORTNESS OF BREATH *UNUSUAL BRUISING OR BLEEDING *URINARY PROBLEMS (pain or burning when urinating, or frequent urination) *BOWEL PROBLEMS (unusual diarrhea, constipation, pain near the anus) TENDERNESS IN MOUTH AND THROAT WITH OR WITHOUT PRESENCE OF ULCERS (sore throat, sores in mouth, or a toothache) UNUSUAL RASH, SWELLING OR PAIN  UNUSUAL VAGINAL DISCHARGE OR ITCHING   Items with * indicate a potential emergency and should be followed up as soon as possible or go to the Emergency Department if any problems should occur.  Please show the CHEMOTHERAPY ALERT CARD or  IMMUNOTHERAPY ALERT CARD at check-in to the Emergency Department and triage nurse.  Should you have questions after your visit or need to cancel or reschedule your appointment, please contact [facility] - A DEPT OF [facility]  Dept: [phone]  and follow the prompts.  Office hours are 8:00 a.m. to 4:30 p.m. Monday - Friday. Please note that voicemails left after 4:00 p.m. may not be returned until the following business day.  We are closed weekends and major holidays. You have access to a nurse at all times for urgent questions. Please call the main number to the clinic Dept: [phone] and follow the prompts.   For any non-urgent questions, you may also contact your provider using MyChart. We now offer e-Visits for anyone 56 and older to request care online for non-urgent symptoms. For details visit [email or web address].   Also download the MyChart app! Go to the app store, search "MyChart", open the app, select [facility], and log in with your MyChart username and password.

## 2023-07-23 NOTE — Assessment & Plan Note (Signed)
 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   CT CAP showed widespread bone metastases with possible pathologic fracture at L3, peripheral right upper lobe nodules nonspecific Patient has cerebral palsy and her sister is the power of attorney for healthcare Treatment plan: Palliative treatment with Taxol x12 cycles completed 08/26/2018, Kingenti, Perjeta ------------------------------------------------------------------------------------------------------------------------ PET CT scan: 10/27/2018: Widespread bone metastatic disease demonstrates reduced with sclerosis and low metabolic activity with SUV of 3-4.  Left level 4 lymph node 0.5 cm and left deep parotid lymph node SUV 4.1 will need follow-up.  Uterine leiomyomas. PET/CT 11/11/2019: Continued mild hypermetabolic activity left supraclavicular lymph node 0.7 cm, old sclerotic bone lesions are stable and not hypermetabolic.  Uterine fibroids   CT CAP 05/27/2022: Similarly treated bone metastases.  Pulmonary nodules felt to be similar CT CAP 12/23/2022: Stable findings without any new or progressive disease.  Stable treated bone metastases.  Stable lung nodules favored to be benign.  Uterine fibroid CT CAP 07/17/2023: No interval change  Current treatment: Herceptin (biosimilar) maintenance along with Tamoxifen   Tamoxifen toxicities: Denies any hot flashes or myalgias. Herceptin toxicities: None  Continue Herceptin and Tamoxifen as scheduled, patient is tolerating well.

## 2023-07-23 NOTE — Progress Notes (Signed)
 Patient Care Team: [name], DO as PCP - General (Family Medicine) Maisie Fus, MD as PCP - Cardiology (Cardiology) Serena Croissant, MD as Consulting Physician (Hematology and Oncology) [facility]  DIAGNOSIS:  Encounter Diagnoses  Name Primary?   Malignant neoplasm metastatic to bone (HCC) Yes   Malignant neoplasm of upper-outer quadrant of right breast in female, estrogen receptor positive (HCC)     SUMMARY OF ONCOLOGIC HISTORY: Oncology History  Malignant neoplasm of upper-outer quadrant of right breast in female, estrogen receptor positive (HCC)  03/18/2018 Initial Diagnosis   Screening detected bilateral breast abnormalities.  Right breast mass UOQ 2.1 cm at 10 o'clock position: Grade 1 ILC ER 70%, PR 0%, HER-2 +3+ by IHC, Ki-67 5%, suspected lymph node in the axilla could not be biopsied; left breast calcifications by ultrasound not visible but 5 cm cystic/tubular structure which on biopsy was fibrocystic change with ADHl; T2N0 stage IIa clinical stage   05/01/2018 Surgery   Bilateral mastectomies: Left mastectomy: DCIS intermediate grade 0.9 cm margins negative Tis NX stage 0; right mastectomy: ILC, grade 3, 3.5 cm, with LCIS, perineural invasion present, margins negative, 15/22 lymph nodes positive with extracapsular extension, ER 90%, PR 0%, HER-2 +(IHC 3+), Ki-67 5%, T2N3 Stage 3B    05/11/2018 Cancer Staging   Staging form: Breast, AJCC 8th Edition - Pathologic stage from 05/11/2018: Stage IIIB (pT2, pN3, cM0, G3, ER+, PR-, HER2+) - Signed by Serena Croissant, MD on 05/11/2018   05/2018 -  Anti-estrogen oral therapy   Tamoxifen daily   06/03/2018 Imaging   Bone scan: Increased tracer uptake in the lumbar spine at L1, L3 and L4, right fourth and seventh ribs, inferior right scapula and distal sternum, T7 and T11 as well.   CT CAP showed widespread bone metastases with possible pathologic fracture at L3, peripheral right upper lobe nodules  nonspecific   06/10/2018 -  Chemotherapy   Taxol Kingenti and Perjeta    10/27/2018 PET scan   Bone metastases less metabolically active.  Couple of lymph nodes with low-level metabolic activity noted.   07/10/2022 - 07/10/2022 Chemotherapy   Patient is on Treatment Plan : BREAST Trastuzumab IV (8/6) or SQ (600) D1 q21d     08/21/2022 -  Chemotherapy   Patient is on Treatment Plan : BREAST MAINTENANCE Trastuzumab IV (6) or SQ (600) D1 q42d x 13 cycles     Malignant neoplasm metastatic to bone (HCC)  06/04/2018 Initial Diagnosis   Malignant neoplasm metastatic to bone (HCC)   07/10/2022 - 07/10/2022 Chemotherapy   Patient is on Treatment Plan : BREAST Trastuzumab IV (8/6) or SQ (600) D1 q21d       CHIEF COMPLIANT: Follow-up on Herceptin and recent scans  HISTORY OF PRESENT ILLNESS: History of Present Illness The patient presents for a routine follow-up after recent CT scans. She expresses initial concern due to the delay in scheduling the scans, but is reassured by the doctor that the results are excellent with no issues or concerns. The patient reports no new symptoms or health concerns. She discusses personal matters, including family and lifestyle, but does not mention any health-related issues.     ALLERGIES:  is allergic to hydrocodone.  MEDICATIONS:  Current Outpatient Medications  Medication Sig Dispense Refill   Accu-Chek Softclix Lancets lancets SMARTSIG:Topical 1-2 Times Daily     Blood Glucose Monitoring Suppl (ACCU-CHEK GUIDE) w/Device KIT PLEASE SEE ATTACHED FOR DETAILED DIRECTIONS     Blood Glucose Monitoring Suppl DEVI Check  BGs up to twice daily.  E11.9 Wants One TOuch May substitute to any manufacturer covered by patient's insurance. 1 each 0   carvedilol (COREG) 3.125 MG tablet Take 1 tablet (3.125 mg total) by mouth 2 (two) times daily. 180 tablet 3   dapagliflozin propanediol (FARXIGA) 10 MG TABS tablet Take 1 tablet (10 mg total) by mouth daily before breakfast.  90 tablet 3   Glucose Blood (BLOOD GLUCOSE TEST STRIPS) STRP Check BGs up to twice daily. E11.9 One touch. May substitute to any manufacturer covered by patient's insurance. 100 strip PRN   Lancet Device MISC Check BGs daily as directed. E11.9. May substitute to any manufacturer covered by patient's insurance. 1 each 0   Lancets Misc. (ACCU-CHEK SOFTCLIX LANCET DEV) KIT PLEASE SEE ATTACHED FOR DETAILED DIRECTIONS     Lancets Misc. MISC Check BGs up to twice daily. E11.9 May substitute to any manufacturer covered by patient's insurance. 100 each PRN   losartan (COZAAR) 50 MG tablet Take 1 tablet (50 mg total) by mouth daily. 90 tablet 3   megestrol (MEGACE) 40 MG tablet TAKE 1 TABLET (40 MG TOTAL) BY MOUTH 2 (TWO) TIMES DAILY. TAKE 3 TABLETS EVERY DAY 270 tablet 2   Multiple Vitamin (MULTI-VITAMIN) tablet Take 1 tablet by mouth daily.     nystatin cream (MYCOSTATIN) Apply 1 Application topically 2 (two) times daily. To area between breast for rash x7-14 days 30 g 0   pantoprazole (PROTONIX) 40 MG tablet TAKE 1 TABLET BY MOUTH 30 MINUTES PRIOR TO BREAKFAST AND SUPPER, 180 tablet 3   potassium chloride (KLOR-CON) 10 MEQ tablet TAKE 1 TABLET BY MOUTH EVERY DAY 90 tablet 1   pravastatin (PRAVACHOL) 40 MG tablet Take 1 tablet (40 mg total) by mouth daily. 90 tablet 3   sitaGLIPtin (JANUVIA) 50 MG tablet Take 1 tablet (50 mg total) by mouth daily. Take with [name] daily for sugar. 90 tablet 3   tamoxifen (NOLVADEX) 20 MG tablet TAKE 1 TABLET BY MOUTH EVERY DAY 90 tablet 3   No current facility-administered medications for this visit.   Facility-Administered Medications Ordered in Other Visits  Medication Dose Route Frequency Provider Last Rate Last Admin   heparin lock flush 100 unit/mL  500 Units Intracatheter Once PRN Serena Croissant, MD       sodium chloride flush (NS) 0.9 % injection 10 mL  10 mL Intracatheter PRN Serena Croissant, MD       trastuzumab-anns [facility]) 357 mg in sodium chloride 0.9 %  250 mL chemo infusion  6 mg/kg (Treatment Plan Recorded) Intravenous Once Serena Croissant, MD        PHYSICAL EXAMINATION: ECOG PERFORMANCE STATUS: 1 - Symptomatic but completely ambulatory  Vitals:   07/23/23 1146  BP: 128/62  Pulse: 89  Resp: 18  Temp: 98.7 F (37.1 C)  SpO2: 100%   Filed Weights   07/23/23 1146  Weight: 123 lb 8 oz (56 kg)    Physical Exam   (exam performed in the presence of a chaperone)  LABORATORY DATA:  I have reviewed the data as listed    Latest Ref Rng & Units 07/23/2023   11:08 AM 06/24/2023    9:23 AM 10/02/2022    8:13 AM  CMP  Glucose 70 - 99 mg/dL 308  657  846   BUN 6 - 20 mg/dL 17  12  16    Creatinine 0.44 - 1.00 mg/dL 9.62  9.52  8.41   Sodium 135 - 145 mmol/L 137  145  140   Potassium 3.5 - 5.1 mmol/L 4.1  3.9  3.8   Chloride 98 - 111 mmol/L 106  111  108   CO2 22 - 32 mmol/L 25  21  25    Calcium 8.9 - 10.3 mg/dL 9.0  9.2  8.9   Total Protein 6.5 - 8.1 g/dL 7.6  7.1  7.4   Total Bilirubin 0.0 - 1.2 mg/dL 0.4  <0.9  0.3   Alkaline Phos 38 - 126 U/L 51  58  50   AST 15 - 41 U/L 13  17  13    ALT 0 - 44 U/L 13  13  11      Lab Results  Component Value Date   WBC 8.3 07/23/2023   HGB 13.2 07/23/2023   HCT 39.5 07/23/2023   MCV 92.3 07/23/2023   PLT 298 07/23/2023   NEUTROABS 5.6 07/23/2023    ASSESSMENT & PLAN:  Malignant neoplasm of upper-outer quadrant of right breast in female, estrogen receptor positive (HCC) 05/01/18: Bilateral mastectomies: Left mastectomy: DCIS intermediate grade 0.9 cm margins negative Tis NX stage 0; right mastectomy: ILC, grade 3, 3.5 cm, with LCIS, perineural invasion present, margins negative, 15/22 lymph nodes positive with extracapsular extension, ER 90%, PR 0%, HER-2 +(IHC 3+), Ki-67 5%, T2N3M1 Stage IV   06/03/2018 bone scan: Increased tracer uptake in the lumbar spine at L1, L3 and L4, right fourth and seventh ribs, inferior right scapula and distal sternum, T7 and T11 as well.   CT CAP showed  widespread bone metastases with possible pathologic fracture at L3, peripheral right upper lobe nodules nonspecific Patient has cerebral palsy and her sister is the power of attorney for healthcare Treatment plan: Palliative treatment with Taxol x12 cycles completed 08/26/2018, Kingenti, Perjeta ------------------------------------------------------------------------------------------------------------------------ PET CT scan: 10/27/2018: Widespread bone metastatic disease demonstrates reduced with sclerosis and low metabolic activity with SUV of 3-4.  Left level 4 lymph node 0.5 cm and left deep parotid lymph node SUV 4.1 will need follow-up.  Uterine leiomyomas. PET/CT 11/11/2019: Continued mild hypermetabolic activity left supraclavicular lymph node 0.7 cm, old sclerotic bone lesions are stable and not hypermetabolic.  Uterine fibroids   CT CAP 05/27/2022: Similarly treated bone metastases.  Pulmonary nodules felt to be similar CT CAP 12/23/2022: Stable findings without any new or progressive disease.  Stable treated bone metastases.  Stable lung nodules favored to be benign.  Uterine fibroid CT CAP 07/17/2023: No interval change  Current treatment: Herceptin (biosimilar) maintenance along with Tamoxifen   Tamoxifen toxicities: Denies any hot flashes or myalgias. Herceptin toxicities: None  Continue Herceptin and Tamoxifen as scheduled, patient is tolerating well.  ------------------------------------- Assessment and Plan Assessment & Plan Malignant neoplasm of upper-outer quadrant of right breast, estrogen receptor positive CT scans indicate well-managed disease. - Continue follow-up every six weeks.  Malignant neoplasm metastatic to bone Recent imaging shows well-managed disease. - Continue follow-up every six weeks.      No orders of the defined types were placed in this encounter.  The patient has a good understanding of the overall plan. she agrees with it. she will call with any  problems that may develop before the next visit here. Total time spent: 30 mins including face to face time and time spent for planning, charting and co-ordination of care   Tamsen Meek, MD 07/23/23

## 2023-08-25 ENCOUNTER — Other Ambulatory Visit: Payer: Self-pay | Admitting: Hematology and Oncology

## 2023-09-03 ENCOUNTER — Inpatient Hospital Stay: Payer: 59 | Attending: Hematology and Oncology

## 2023-09-03 VITALS — BP 138/86 | HR 86 | Temp 98.0°F | Resp 16

## 2023-09-03 DIAGNOSIS — Z1731 Human epidermal growth factor receptor 2 positive status: Secondary | ICD-10-CM | POA: Diagnosis not present

## 2023-09-03 DIAGNOSIS — C50411 Malignant neoplasm of upper-outer quadrant of right female breast: Secondary | ICD-10-CM | POA: Diagnosis not present

## 2023-09-03 DIAGNOSIS — Z5112 Encounter for antineoplastic immunotherapy: Secondary | ICD-10-CM | POA: Insufficient documentation

## 2023-09-03 DIAGNOSIS — C7951 Secondary malignant neoplasm of bone: Secondary | ICD-10-CM | POA: Diagnosis not present

## 2023-09-03 DIAGNOSIS — Z170 Estrogen receptor positive status [ER+]: Secondary | ICD-10-CM | POA: Insufficient documentation

## 2023-09-03 DIAGNOSIS — Z1722 Progesterone receptor negative status: Secondary | ICD-10-CM | POA: Insufficient documentation

## 2023-09-03 MED ORDER — HEPARIN SOD (PORK) LOCK FLUSH 100 UNIT/ML IV SOLN
500.0000 [IU] | Freq: Once | INTRAVENOUS | Status: AC | PRN
  Administered 2023-09-03: 500 [IU]

## 2023-09-03 MED ORDER — SODIUM CHLORIDE 0.9 % IV SOLN: Freq: Once | INTRAVENOUS | Status: AC

## 2023-09-03 MED ORDER — ACETAMINOPHEN 325 MG PO TABS
650.0000 mg | ORAL_TABLET | Freq: Once | ORAL | Status: AC
  Administered 2023-09-03: 650 mg via ORAL
  Filled 2023-09-03: qty 2

## 2023-09-03 MED ORDER — DIPHENHYDRAMINE HCL 25 MG PO CAPS
50.0000 mg | ORAL_CAPSULE | Freq: Once | ORAL | Status: AC
  Administered 2023-09-03: 50 mg via ORAL
  Filled 2023-09-03: qty 2

## 2023-09-03 MED ORDER — TRASTUZUMAB-ANNS CHEMO 150 MG IV SOLR
6.0000 mg/kg | Freq: Once | INTRAVENOUS | Status: AC
  Administered 2023-09-03: 357 mg via INTRAVENOUS
  Filled 2023-09-03: qty 17

## 2023-09-03 MED ORDER — SODIUM CHLORIDE 0.9% FLUSH
10.0000 mL | INTRAVENOUS | Status: DC | PRN
  Administered 2023-09-03: 10 mL

## 2023-09-03 NOTE — Patient Instructions (Signed)

## 2023-09-24 ENCOUNTER — Ambulatory Visit (HOSPITAL_COMMUNITY): Payer: 59 | Attending: Cardiology

## 2023-09-24 DIAGNOSIS — R9431 Abnormal electrocardiogram [ECG] [EKG]: Secondary | ICD-10-CM

## 2023-09-24 LAB — ECHOCARDIOGRAM LIMITED
Area-P 1/2: 3.16 cm2
S' Lateral: 2.7 cm

## 2023-09-27 ENCOUNTER — Ambulatory Visit: Payer: Self-pay | Admitting: Cardiology

## 2023-09-27 NOTE — Progress Notes (Signed)
 Please let patient know the echo is essentially normal with minor abnormality. Mild diastolic dysfunction.  Diastolic function means the heart has to relax to receive the blood so it can pump the blood out. If relaxation is impaired, then the blood coming to the heart is restricted and can cause dyspnea and reduced functional capacity and fluid build up. Exercise, weight loss, control of BP, salt restriction will improve this.

## 2023-10-14 NOTE — Assessment & Plan Note (Signed)
 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   CT CAP showed widespread bone metastases with possible pathologic fracture at L3, peripheral right upper lobe nodules nonspecific Patient has cerebral palsy and her sister is the power of attorney for healthcare Treatment plan: Palliative treatment with Taxol x12 cycles completed 08/26/2018, Kingenti, Perjeta ------------------------------------------------------------------------------------------------------------------------ PET CT scan: 10/27/2018: Widespread bone metastatic disease demonstrates reduced with sclerosis and low metabolic activity with SUV of 3-4.  Left level 4 lymph node 0.5 cm and left deep parotid lymph node SUV 4.1 will need follow-up.  Uterine leiomyomas. PET/CT 11/11/2019: Continued mild hypermetabolic activity left supraclavicular lymph node 0.7 cm, old sclerotic bone lesions are stable and not hypermetabolic.  Uterine fibroids   CT CAP 05/27/2022: Similarly treated bone metastases.  Pulmonary nodules felt to be similar CT CAP 12/23/2022: Stable findings without any new or progressive disease.  Stable treated bone metastases.  Stable lung nodules favored to be benign.  Uterine fibroid CT CAP 07/17/2023: No interval change  Current treatment: Herceptin (biosimilar) maintenance along with Tamoxifen   Tamoxifen toxicities: Denies any hot flashes or myalgias. Herceptin toxicities: None  Continue Herceptin and Tamoxifen as scheduled, patient is tolerating well.

## 2023-10-15 ENCOUNTER — Inpatient Hospital Stay: Payer: 59 | Attending: Hematology and Oncology

## 2023-10-15 ENCOUNTER — Inpatient Hospital Stay (HOSPITAL_BASED_OUTPATIENT_CLINIC_OR_DEPARTMENT_OTHER): Payer: 59 | Admitting: Hematology and Oncology

## 2023-10-15 VITALS — BP 142/85 | HR 76 | Temp 98.8°F | Resp 18 | Ht 62.0 in | Wt 123.0 lb

## 2023-10-15 DIAGNOSIS — G809 Cerebral palsy, unspecified: Secondary | ICD-10-CM | POA: Insufficient documentation

## 2023-10-15 DIAGNOSIS — C50411 Malignant neoplasm of upper-outer quadrant of right female breast: Secondary | ICD-10-CM | POA: Diagnosis not present

## 2023-10-15 DIAGNOSIS — C7951 Secondary malignant neoplasm of bone: Secondary | ICD-10-CM | POA: Insufficient documentation

## 2023-10-15 DIAGNOSIS — Z1731 Human epidermal growth factor receptor 2 positive status: Secondary | ICD-10-CM | POA: Diagnosis not present

## 2023-10-15 DIAGNOSIS — R918 Other nonspecific abnormal finding of lung field: Secondary | ICD-10-CM | POA: Insufficient documentation

## 2023-10-15 DIAGNOSIS — Z5112 Encounter for antineoplastic immunotherapy: Secondary | ICD-10-CM | POA: Diagnosis not present

## 2023-10-15 DIAGNOSIS — Z170 Estrogen receptor positive status [ER+]: Secondary | ICD-10-CM

## 2023-10-15 DIAGNOSIS — Z1722 Progesterone receptor negative status: Secondary | ICD-10-CM | POA: Diagnosis not present

## 2023-10-15 DIAGNOSIS — Z9013 Acquired absence of bilateral breasts and nipples: Secondary | ICD-10-CM | POA: Insufficient documentation

## 2023-10-15 DIAGNOSIS — Z79810 Long term (current) use of selective estrogen receptor modulators (SERMs): Secondary | ICD-10-CM | POA: Insufficient documentation

## 2023-10-15 DIAGNOSIS — D259 Leiomyoma of uterus, unspecified: Secondary | ICD-10-CM | POA: Insufficient documentation

## 2023-10-15 MED ORDER — DIPHENHYDRAMINE HCL 25 MG PO CAPS
50.0000 mg | ORAL_CAPSULE | Freq: Once | ORAL | Status: AC
  Administered 2023-10-15: 50 mg via ORAL
  Filled 2023-10-15: qty 2

## 2023-10-15 MED ORDER — SODIUM CHLORIDE 0.9 % IV SOLN: Freq: Once | INTRAVENOUS | Status: AC

## 2023-10-15 MED ORDER — ACETAMINOPHEN 325 MG PO TABS
650.0000 mg | ORAL_TABLET | Freq: Once | ORAL | Status: AC
  Administered 2023-10-15: 650 mg via ORAL
  Filled 2023-10-15: qty 2

## 2023-10-15 MED ORDER — HEPARIN SOD (PORK) LOCK FLUSH 100 UNIT/ML IV SOLN
500.0000 [IU] | Freq: Once | INTRAVENOUS | Status: DC | PRN
Start: 2023-10-15 — End: 2023-10-15

## 2023-10-15 MED ORDER — TRASTUZUMAB-ANNS CHEMO 150 MG IV SOLR
6.0000 mg/kg | Freq: Once | INTRAVENOUS | Status: AC
  Administered 2023-10-15: 357 mg via INTRAVENOUS
  Filled 2023-10-15: qty 17

## 2023-10-15 MED ORDER — SODIUM CHLORIDE 0.9% FLUSH: 10.0000 mL | INTRAVENOUS | Status: DC | PRN

## 2023-10-15 NOTE — Patient Instructions (Signed)

## 2023-10-15 NOTE — Progress Notes (Signed)
 Patient Care Team: Eliodoro Guerin, DO as PCP - General (Family Medicine) Bridgette Campus, MD (Inactive) as PCP - Cardiology (Cardiology) Cameron Cea, MD as Consulting Physician (Hematology and Oncology) [facility]  DIAGNOSIS:  Encounter Diagnosis  Name Primary?   Malignant neoplasm of upper-outer quadrant of right breast in female, estrogen receptor positive (HCC) Yes    SUMMARY OF ONCOLOGIC HISTORY: Oncology History  Malignant neoplasm of upper-outer quadrant of right breast in female, estrogen receptor positive (HCC)  03/18/2018 Initial Diagnosis   Screening detected bilateral breast abnormalities.  Right breast mass UOQ 2.1 cm at 10 o'clock position: Grade 1 ILC ER 70%, PR 0%, HER-2 +3+ by IHC, Ki-67 5%, suspected lymph node in the axilla could not be biopsied; left breast calcifications by ultrasound not visible but 5 cm cystic/tubular structure which on biopsy was fibrocystic change with ADHl; T2N0 stage IIa clinical stage   05/01/2018 Surgery   Bilateral mastectomies: Left mastectomy: DCIS intermediate grade 0.9 cm margins negative Tis NX stage 0; right mastectomy: ILC, grade 3, 3.5 cm, with LCIS, perineural invasion present, margins negative, 15/22 lymph nodes positive with extracapsular extension, ER 90%, PR 0%, HER-2 +(IHC 3+), Ki-67 5%, T2N3 Stage 3B    05/11/2018 Cancer Staging   Staging form: Breast, AJCC 8th Edition - Pathologic stage from 05/11/2018: Stage IIIB (pT2, pN3, cM0, G3, ER+, PR-, HER2+) - Signed by Cameron Cea, MD on 05/11/2018   05/2018 -  Anti-estrogen oral therapy   Tamoxifen  daily   06/03/2018 Imaging   Bone scan: Increased tracer uptake in the lumbar spine at L1, L3 and L4, right fourth and seventh ribs, inferior right scapula and distal sternum, T7 and T11 as well.   CT CAP showed widespread bone metastases with possible pathologic fracture at L3, peripheral right upper lobe nodules nonspecific   06/10/2018 -  Chemotherapy    Taxol  Kingenti and Perjeta     10/27/2018 PET scan   Bone metastases less metabolically active.  Couple of lymph nodes with low-level metabolic activity noted.   07/10/2022 - 07/10/2022 Chemotherapy   Patient is on Treatment Plan : BREAST Trastuzumab  IV (8/6) or SQ (600) D1 q21d     08/21/2022 -  Chemotherapy   Patient is on Treatment Plan : BREAST MAINTENANCE Trastuzumab  IV (6) or SQ (600) D1 q42d x 13 cycles     Malignant neoplasm metastatic to bone (HCC)  06/04/2018 Initial Diagnosis   Malignant neoplasm metastatic to bone (HCC)   07/10/2022 - 07/10/2022 Chemotherapy   Patient is on Treatment Plan : BREAST Trastuzumab  IV (8/6) or SQ (600) D1 q21d       CHIEF COMPLIANT: Follow-up on Herceptin  and tamoxifen   HISTORY OF PRESENT ILLNESS:  History of Present Illness BLIMI GODBY "Renette" is a 58 year old female with metastatic breast cancer who presents for routine follow-up in hematology and medical oncology. She is accompanied by her daughter.  She is on a six-week treatment schedule for estrogen receptor positive breast cancer in the upper-outer quadrant of the right breast. She tolerates the treatment well without significant side effects. No new symptoms or complications are present.  Her last scan was on July 07, 2023, with the next scan scheduled in approximately three months. A recent echocardiogram was normal, although she experiences occasional palpitations.     ALLERGIES:  is allergic to hydrocodone .  MEDICATIONS:  Current Outpatient Medications  Medication Sig Dispense Refill   Accu-Chek Softclix Lancets lancets SMARTSIG:Topical 1-2 Times Daily  Blood Glucose Monitoring Suppl (ACCU-CHEK GUIDE) w/Device KIT PLEASE SEE ATTACHED FOR DETAILED DIRECTIONS     Blood Glucose Monitoring Suppl DEVI Check BGs up to twice daily.  E11.9 Wants One TOuch May substitute to any manufacturer covered by patient's insurance. 1 each 0   carvedilol  (COREG ) 3.125 MG tablet Take 1 tablet  (3.125 mg total) by mouth 2 (two) times daily. 180 tablet 3   dapagliflozin  propanediol (FARXIGA ) 10 MG TABS tablet Take 1 tablet (10 mg total) by mouth daily before breakfast. 90 tablet 3   Glucose Blood (BLOOD GLUCOSE TEST STRIPS) STRP Check BGs up to twice daily. E11.9 One touch. May substitute to any manufacturer covered by patient's insurance. 100 strip PRN   Lancet Device MISC Check BGs daily as directed. E11.9. May substitute to any manufacturer covered by patient's insurance. 1 each 0   Lancets Misc. (ACCU-CHEK SOFTCLIX LANCET DEV) KIT PLEASE SEE ATTACHED FOR DETAILED DIRECTIONS     Lancets Misc. MISC Check BGs up to twice daily. E11.9 May substitute to any manufacturer covered by patient's insurance. 100 each PRN   losartan  (COZAAR ) 50 MG tablet Take 1 tablet (50 mg total) by mouth daily. 90 tablet 3   megestrol  (MEGACE ) 40 MG tablet TAKE 1 TABLET (40 MG TOTAL) BY MOUTH 2 (TWO) TIMES DAILY. TAKE 3 TABLETS EVERY DAY 270 tablet 2   Multiple Vitamin (MULTI-VITAMIN) tablet Take 1 tablet by mouth daily.     nystatin  cream (MYCOSTATIN ) Apply 1 Application topically 2 (two) times daily. To area between breast for rash x7-14 days 30 g 0   pantoprazole  (PROTONIX ) 40 MG tablet TAKE 1 TABLET BY MOUTH 30 MINUTES PRIOR TO BREAKFAST AND SUPPER, 180 tablet 3   potassium chloride  (KLOR-CON ) 10 MEQ tablet TAKE 1 TABLET BY MOUTH EVERY DAY 90 tablet 1   pravastatin  (PRAVACHOL ) 40 MG tablet Take 1 tablet (40 mg total) by mouth daily. 90 tablet 3   sitaGLIPtin  (JANUVIA ) 50 MG tablet Take 1 tablet (50 mg total) by mouth daily. Take with Farxiga  daily for sugar. 90 tablet 3   tamoxifen  (NOLVADEX ) 20 MG tablet TAKE 1 TABLET BY MOUTH EVERY DAY 90 tablet 3   No current facility-administered medications for this visit.    PHYSICAL EXAMINATION: ECOG PERFORMANCE STATUS: 1 - Symptomatic but completely ambulatory  There were no vitals filed for this visit. There were no vitals filed for this visit.  Physical  Exam   (exam performed in the presence of a chaperone)  LABORATORY DATA:  I have reviewed the data as listed    Latest Ref Rng & Units 07/23/2023   11:08 AM 06/24/2023    9:23 AM 10/02/2022    8:13 AM  CMP  Glucose 70 - 99 mg/dL 782  956  213   BUN 6 - 20 mg/dL 17  12  16    Creatinine 0.44 - 1.00 mg/dL 0.86  5.78  4.69   Sodium 135 - 145 mmol/L 137  145  140   Potassium 3.5 - 5.1 mmol/L 4.1  3.9  3.8   Chloride 98 - 111 mmol/L 106  111  108   CO2 22 - 32 mmol/L 25  21  25    Calcium 8.9 - 10.3 mg/dL 9.0  9.2  8.9   Total Protein 6.5 - 8.1 g/dL 7.6  7.1  7.4   Total Bilirubin 0.0 - 1.2 mg/dL 0.4  <6.2  0.3   Alkaline Phos 38 - 126 U/L 51  58  50   AST  15 - 41 U/L 13  17  13    ALT 0 - 44 U/L 13  13  11      Lab Results  Component Value Date   WBC 8.3 07/23/2023   HGB 13.2 07/23/2023   HCT 39.5 07/23/2023   MCV 92.3 07/23/2023   PLT 298 07/23/2023   NEUTROABS 5.6 07/23/2023    ASSESSMENT & PLAN:  Malignant neoplasm of upper-outer quadrant of right breast in female, estrogen receptor positive (HCC) 05/01/18: Bilateral mastectomies: Left mastectomy: DCIS intermediate grade 0.9 cm margins negative Tis NX stage 0; right mastectomy: ILC, grade 3, 3.5 cm, with LCIS, perineural invasion present, margins negative, 15/22 lymph nodes positive with extracapsular extension, ER 90%, PR 0%, HER-2 +(IHC 3+), Ki-67 5%, T2N3M1 Stage IV   06/03/2018 bone scan: Increased tracer uptake in the lumbar spine at L1, L3 and L4, right fourth and seventh ribs, inferior right scapula and distal sternum, T7 and T11 as well.   CT CAP showed widespread bone metastases with possible pathologic fracture at L3, peripheral right upper lobe nodules nonspecific Patient has cerebral palsy and her sister is the power of attorney for healthcare Treatment plan: Palliative treatment with Taxol  x12 cycles completed 08/26/2018, Kingenti,  Perjeta  ------------------------------------------------------------------------------------------------------------------------ PET CT scan: 10/27/2018: Widespread bone metastatic disease demonstrates reduced with sclerosis and low metabolic activity with SUV of 3-4.  Left level 4 lymph node 0.5 cm and left deep parotid lymph node SUV 4.1 will need follow-up.  Uterine leiomyomas. PET/CT 11/11/2019: Continued mild hypermetabolic activity left supraclavicular lymph node 0.7 cm, old sclerotic bone lesions are stable and not hypermetabolic.  Uterine fibroids   CT CAP 05/27/2022: Similarly treated bone metastases.  Pulmonary nodules felt to be similar CT CAP 12/23/2022: Stable findings without any new or progressive disease.  Stable treated bone metastases.  Stable lung nodules favored to be benign.  Uterine fibroid CT CAP 07/17/2023: No interval change   Current treatment: Herceptin  (biosimilar) maintenance along with Tamoxifen    Tamoxifen  toxicities: Denies any hot flashes or myalgias. Herceptin  toxicities: None   Continue Herceptin  and Tamoxifen  as scheduled, patient is tolerating well.  Plan to perform scans in 3 months Assessment & Plan Malignant neoplasm of upper-outer quadrant of right breast, estrogen receptor positive Undergoing treatment every six weeks. Treatment well-tolerated. Next scan planned for August. - Schedule follow-up scan for August 20th. - Ensure follow-up appointments every six weeks.      No orders of the defined types were placed in this encounter.  The patient has a good understanding of the overall plan. she agrees with it. she will call with any problems that may develop before the next visit here. Total time spent: 30 mins including face to face time and time spent for planning, charting and co-ordination of care   Margert Sheerer, MD 10/15/23

## 2023-11-12 ENCOUNTER — Other Ambulatory Visit: Payer: Self-pay

## 2023-11-20 ENCOUNTER — Other Ambulatory Visit: Payer: Self-pay | Admitting: Hematology and Oncology

## 2023-11-20 DIAGNOSIS — K219 Gastro-esophageal reflux disease without esophagitis: Secondary | ICD-10-CM

## 2023-11-26 ENCOUNTER — Inpatient Hospital Stay: Attending: Hematology and Oncology

## 2023-11-26 VITALS — BP 146/96 | HR 82 | Temp 98.2°F | Resp 16

## 2023-11-26 DIAGNOSIS — Z1731 Human epidermal growth factor receptor 2 positive status: Secondary | ICD-10-CM | POA: Diagnosis not present

## 2023-11-26 DIAGNOSIS — C7951 Secondary malignant neoplasm of bone: Secondary | ICD-10-CM | POA: Insufficient documentation

## 2023-11-26 DIAGNOSIS — Z170 Estrogen receptor positive status [ER+]: Secondary | ICD-10-CM | POA: Insufficient documentation

## 2023-11-26 DIAGNOSIS — C50411 Malignant neoplasm of upper-outer quadrant of right female breast: Secondary | ICD-10-CM | POA: Insufficient documentation

## 2023-11-26 DIAGNOSIS — Z1722 Progesterone receptor negative status: Secondary | ICD-10-CM | POA: Insufficient documentation

## 2023-11-26 DIAGNOSIS — Z5112 Encounter for antineoplastic immunotherapy: Secondary | ICD-10-CM | POA: Insufficient documentation

## 2023-11-26 MED ORDER — SODIUM CHLORIDE 0.9% FLUSH: 10.0000 mL | INTRAVENOUS | Status: DC | PRN

## 2023-11-26 MED ORDER — ACETAMINOPHEN 325 MG PO TABS
650.0000 mg | ORAL_TABLET | Freq: Once | ORAL | Status: AC
  Administered 2023-11-26: 650 mg via ORAL
  Filled 2023-11-26: qty 2

## 2023-11-26 MED ORDER — TRASTUZUMAB-ANNS CHEMO 150 MG IV SOLR
6.0000 mg/kg | Freq: Once | INTRAVENOUS | Status: AC
  Administered 2023-11-26: 357 mg via INTRAVENOUS
  Filled 2023-11-26: qty 17

## 2023-11-26 MED ORDER — SODIUM CHLORIDE 0.9 % IV SOLN
Freq: Once | INTRAVENOUS | Status: AC
Start: 2023-11-26 — End: 2023-11-26

## 2023-11-26 MED ORDER — DIPHENHYDRAMINE HCL 25 MG PO CAPS
50.0000 mg | ORAL_CAPSULE | Freq: Once | ORAL | Status: AC
  Administered 2023-11-26: 50 mg via ORAL
  Filled 2023-11-26: qty 2

## 2023-11-26 MED ORDER — HEPARIN SOD (PORK) LOCK FLUSH 100 UNIT/ML IV SOLN
500.0000 [IU] | Freq: Once | INTRAVENOUS | Status: DC | PRN
Start: 2023-11-26 — End: 2023-11-26

## 2023-11-26 NOTE — Patient Instructions (Signed)
 [facility] - A DEPT OF [facility]  Discharge Instructions: Thank you for choosing [facility] to provide your oncology and hematology care.   If you have a lab appointment with the Cancer Center, please go directly to the Cancer Center and check in at the registration area.   Wear comfortable clothing and clothing appropriate for easy access to any Portacath or PICC line.   We strive to give you quality time with your provider. You may need to reschedule your appointment if you arrive late (15 or more minutes).  Arriving late affects you and other patients whose appointments are after yours.  Also, if you miss three or more appointments without notifying the office, you may be dismissed from the clinic at the provider's discretion.      For prescription refill requests, have your pharmacy contact our office and allow 72 hours for refills to be completed.    Today you received the following chemotherapy and/or immunotherapy agents kanjinti      To help prevent nausea and vomiting after your treatment, we encourage you to take your nausea medication as directed.  BELOW ARE SYMPTOMS THAT SHOULD BE REPORTED IMMEDIATELY: *FEVER GREATER THAN 100.4 F (38 C) OR HIGHER *CHILLS OR SWEATING *NAUSEA AND VOMITING THAT IS NOT CONTROLLED WITH YOUR NAUSEA MEDICATION *UNUSUAL SHORTNESS OF BREATH *UNUSUAL BRUISING OR BLEEDING *URINARY PROBLEMS (pain or burning when urinating, or frequent urination) *BOWEL PROBLEMS (unusual diarrhea, constipation, pain near the anus) TENDERNESS IN MOUTH AND THROAT WITH OR WITHOUT PRESENCE OF ULCERS (sore throat, sores in mouth, or a toothache) UNUSUAL RASH, SWELLING OR PAIN  UNUSUAL VAGINAL DISCHARGE OR ITCHING   Items with * indicate a potential emergency and should be followed up as soon as possible or go to the Emergency Department if any problems should occur.  Please show the CHEMOTHERAPY ALERT CARD or IMMUNOTHERAPY  ALERT CARD at check-in to the Emergency Department and triage nurse.  Should you have questions after your visit or need to cancel or reschedule your appointment, please contact [facility] - A DEPT OF [facility]  Dept: [phone]  and follow the prompts.  Office hours are 8:00 a.m. to 4:30 p.m. Monday - Friday. Please note that voicemails left after 4:00 p.m. may not be returned until the following business day.  We are closed weekends and major holidays. You have access to a nurse at all times for urgent questions. Please call the main number to the clinic Dept: [phone] and follow the prompts.   For any non-urgent questions, you may also contact your provider using MyChart. We now offer e-Visits for anyone 81 and older to request care online for non-urgent symptoms. For details visit [email or web address].   Also download the MyChart app! Go to the app store, search "MyChart", open the app, select [facility], and log in with your MyChart username and password.

## 2023-12-02 ENCOUNTER — Other Ambulatory Visit: Payer: Self-pay | Admitting: Family Medicine

## 2023-12-02 DIAGNOSIS — E119 Type 2 diabetes mellitus without complications: Secondary | ICD-10-CM

## 2023-12-31 ENCOUNTER — Ambulatory Visit (HOSPITAL_COMMUNITY)
Admission: RE | Admit: 2023-12-31 | Discharge: 2023-12-31 | Disposition: A | Discharge status: Home / Self Care | Source: Ambulatory Visit | Attending: Hematology and Oncology | Admitting: Hematology and Oncology

## 2023-12-31 DIAGNOSIS — Z170 Estrogen receptor positive status [ER+]: Secondary | ICD-10-CM | POA: Diagnosis not present

## 2023-12-31 DIAGNOSIS — C7951 Secondary malignant neoplasm of bone: Secondary | ICD-10-CM | POA: Diagnosis not present

## 2023-12-31 DIAGNOSIS — C50411 Malignant neoplasm of upper-outer quadrant of right female breast: Secondary | ICD-10-CM | POA: Insufficient documentation

## 2023-12-31 DIAGNOSIS — R7303 Prediabetes: Secondary | ICD-10-CM | POA: Insufficient documentation

## 2023-12-31 DIAGNOSIS — I700 Atherosclerosis of aorta: Secondary | ICD-10-CM | POA: Diagnosis not present

## 2023-12-31 LAB — POCT I-STAT CREATININE
Creatinine, Ser: 0.5 mg/dL (ref 0.44–1.00)
Creatinine, Ser: <0.2 mg/dL — ABNORMAL LOW (ref 0.44–1.00)

## 2023-12-31 MED ORDER — HEPARIN SOD (PORK) LOCK FLUSH 100 UNIT/ML IV SOLN
INTRAVENOUS | Status: AC
  Filled 2023-12-31: qty 5

## 2023-12-31 MED ORDER — HEPARIN SOD (PORK) LOCK FLUSH 100 UNIT/ML IV SOLN
500.0000 [IU] | Freq: Once | INTRAVENOUS | Status: AC
  Administered 2023-12-31: 500 [IU] via INTRAVENOUS

## 2023-12-31 MED ORDER — IOHEXOL 300 MG/ML  SOLN
100.0000 mL | Freq: Once | INTRAMUSCULAR | Status: AC | PRN
  Administered 2023-12-31: 100 mL via INTRAVENOUS

## 2024-01-06 NOTE — Assessment & Plan Note (Signed)
 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   CT CAP showed widespread bone metastases with possible pathologic fracture at L3, peripheral right upper lobe nodules nonspecific Patient has cerebral palsy and her sister is the power of attorney for healthcare Treatment plan: Palliative treatment with Taxol  x12 cycles completed 08/26/2018, Kingenti, Perjeta  ------------------------------------------------------------------------------------------------------------------------ PET CT scan: 10/27/2018: Widespread bone metastatic disease demonstrates reduced with sclerosis and low metabolic activity with SUV of 3-4.  Left level 4 lymph node 0.5 cm and left deep parotid lymph node SUV 4.1 will need follow-up.  Uterine leiomyomas. PET/CT 11/11/2019: Continued mild hypermetabolic activity left supraclavicular lymph node 0.7 cm, old sclerotic bone lesions are stable and not hypermetabolic.  Uterine fibroids   CT CAP 05/27/2022: Similarly treated bone metastases.  Pulmonary nodules felt to be similar CT CAP 12/23/2022: Stable findings without any new or progressive disease.  Stable treated bone metastases.  Stable lung nodules favored to be benign.  Uterine fibroid CT CAP 07/17/2023: No interval change   Current treatment: Herceptin  (biosimilar) maintenance along with Tamoxifen    Tamoxifen  toxicities: Denies any hot flashes or myalgias. Herceptin  toxicities: None   Continue Herceptin  and Tamoxifen  as scheduled, patient is tolerating well.  CT CAP 12/31/2023:  Pending

## 2024-01-07 ENCOUNTER — Inpatient Hospital Stay: Attending: Hematology and Oncology | Admitting: Hematology and Oncology

## 2024-01-07 ENCOUNTER — Ambulatory Visit: Admitting: Hematology and Oncology

## 2024-01-07 ENCOUNTER — Other Ambulatory Visit: Payer: Self-pay | Admitting: *Deleted

## 2024-01-07 ENCOUNTER — Inpatient Hospital Stay: Attending: Hematology and Oncology

## 2024-01-07 VITALS — BP 132/78 | HR 89 | Temp 99.1°F | Resp 16 | Wt 156.5 lb

## 2024-01-07 DIAGNOSIS — C7951 Secondary malignant neoplasm of bone: Secondary | ICD-10-CM | POA: Diagnosis not present

## 2024-01-07 DIAGNOSIS — C50411 Malignant neoplasm of upper-outer quadrant of right female breast: Secondary | ICD-10-CM | POA: Diagnosis not present

## 2024-01-07 DIAGNOSIS — Z5112 Encounter for antineoplastic immunotherapy: Secondary | ICD-10-CM | POA: Insufficient documentation

## 2024-01-07 DIAGNOSIS — Z1731 Human epidermal growth factor receptor 2 positive status: Secondary | ICD-10-CM | POA: Diagnosis not present

## 2024-01-07 DIAGNOSIS — G809 Cerebral palsy, unspecified: Secondary | ICD-10-CM | POA: Diagnosis not present

## 2024-01-07 DIAGNOSIS — Z170 Estrogen receptor positive status [ER+]: Secondary | ICD-10-CM | POA: Diagnosis not present

## 2024-01-07 DIAGNOSIS — Z5181 Encounter for therapeutic drug level monitoring: Secondary | ICD-10-CM

## 2024-01-07 DIAGNOSIS — Z1722 Progesterone receptor negative status: Secondary | ICD-10-CM | POA: Insufficient documentation

## 2024-01-07 DIAGNOSIS — Z79810 Long term (current) use of selective estrogen receptor modulators (SERMs): Secondary | ICD-10-CM | POA: Diagnosis not present

## 2024-01-07 MED ORDER — DIPHENHYDRAMINE HCL 25 MG PO CAPS
50.0000 mg | ORAL_CAPSULE | Freq: Once | ORAL | Status: AC
Start: 2024-01-07 — End: 2024-01-07
  Administered 2024-01-07: 50 mg via ORAL
  Filled 2024-01-07: qty 2

## 2024-01-07 MED ORDER — ACETAMINOPHEN 325 MG PO TABS
650.0000 mg | ORAL_TABLET | Freq: Once | ORAL | Status: AC
Start: 2024-01-07 — End: 2024-01-07
  Administered 2024-01-07: 650 mg via ORAL
  Filled 2024-01-07: qty 2

## 2024-01-07 MED ORDER — TRASTUZUMAB-ANNS CHEMO 150 MG IV SOLR
315.0000 mg | Freq: Once | INTRAVENOUS | Status: AC
  Administered 2024-01-07: 315 mg via INTRAVENOUS
  Filled 2024-01-07: qty 15

## 2024-01-07 MED ORDER — SODIUM CHLORIDE 0.9 % IV SOLN: Freq: Once | INTRAVENOUS | Status: AC

## 2024-01-07 MED ORDER — TRASTUZUMAB-ANNS CHEMO 150 MG IV SOLR: 6.0000 mg/kg | Freq: Once | INTRAVENOUS | Status: DC

## 2024-01-07 MED ORDER — SODIUM CHLORIDE 0.9% FLUSH: 10.0000 mL | INTRAVENOUS | Status: DC | PRN

## 2024-01-07 NOTE — Progress Notes (Addendum)
 Okay to reduce Kanjinti  dose today and for future treatments as well to account for weight loss per Dr. Gudena. Dose rounded per EPIC rounding.  Harlene Nasuti, PharmD Oncology Infusion Pharmacist 01/07/2024 11:19 AM

## 2024-01-07 NOTE — Progress Notes (Signed)
 Patient Care Team: Jolinda Norene HERO, DO as PCP - General (Family Medicine) Alvan Ronal BRAVO, MD (Inactive) as PCP - Cardiology (Cardiology) Odean Potts, MD as Consulting Physician (Hematology and Oncology) [facility]  DIAGNOSIS:  Encounter Diagnosis  Name Primary?   Malignant neoplasm of upper-outer quadrant of right breast in female, estrogen receptor positive (HCC) Yes    SUMMARY OF ONCOLOGIC HISTORY: Oncology History  Malignant neoplasm of upper-outer quadrant of right breast in female, estrogen receptor positive (HCC)  03/18/2018 Initial Diagnosis   Screening detected bilateral breast abnormalities.  Right breast mass UOQ 2.1 cm at 10 o'clock position: Grade 1 ILC ER 70%, PR 0%, HER-2 +3+ by IHC, Ki-67 5%, suspected lymph node in the axilla could not be biopsied; left breast calcifications by ultrasound not visible but 5 cm cystic/tubular structure which on biopsy was fibrocystic change with ADHl; T2N0 stage IIa clinical stage   05/01/2018 Surgery   Bilateral mastectomies: Left mastectomy: DCIS intermediate grade 0.9 cm margins negative Tis NX stage 0; right mastectomy: ILC, grade 3, 3.5 cm, with LCIS, perineural invasion present, margins negative, 15/22 lymph nodes positive with extracapsular extension, ER 90%, PR 0%, HER-2 +(IHC 3+), Ki-67 5%, T2N3 Stage 3B    05/11/2018 Cancer Staging   Staging form: Breast, AJCC 8th Edition - Pathologic stage from 05/11/2018: Stage IIIB (pT2, pN3, cM0, G3, ER+, PR-, HER2+) - Signed by Odean Potts, MD on 05/11/2018   05/2018 -  Anti-estrogen oral therapy   Tamoxifen  daily   06/03/2018 Imaging   Bone scan: Increased tracer uptake in the lumbar spine at L1, L3 and L4, right fourth and seventh ribs, inferior right scapula and distal sternum, T7 and T11 as well.   CT CAP showed widespread bone metastases with possible pathologic fracture at L3, peripheral right upper lobe nodules nonspecific   06/10/2018 -  Chemotherapy    Taxol  Kingenti and Perjeta     10/27/2018 PET scan   Bone metastases less metabolically active.  Couple of lymph nodes with low-level metabolic activity noted.   07/10/2022 - 07/10/2022 Chemotherapy   Patient is on Treatment Plan : BREAST Trastuzumab  IV (8/6) or SQ (600) D1 q21d     08/21/2022 -  Chemotherapy   Patient is on Treatment Plan : BREAST MAINTENANCE Trastuzumab  IV (6) or SQ (600) D1 q42d x 13 cycles     Malignant neoplasm metastatic to bone (HCC)  06/04/2018 Initial Diagnosis   Malignant neoplasm metastatic to bone (HCC)   07/10/2022 - 07/10/2022 Chemotherapy   Patient is on Treatment Plan : BREAST Trastuzumab  IV (8/6) or SQ (600) D1 q21d       CHIEF COMPLIANT: Herceptin  maintenance  HISTORY OF PRESENT ILLNESS:   History of Present Illness [name] is a 58 year old female who presents for follow-up of a CT scan.  She is currently on Herceptin  maintenance therapy every 6 weeks.  She is tolerating it extremely well without any problems concerns.  She also takes antiestrogen therapy with tamoxifen .  She does not report any problems or concerns with that either.  She is awaiting the results of a recent CT scan, which has not yet been officially read due to a backlog.     ALLERGIES:  is allergic to hydrocodone .  MEDICATIONS:  Current Outpatient Medications  Medication Sig Dispense Refill   Accu-Chek Softclix Lancets lancets Check BGS up to twice daily Dx E11.9 200 each 3   Blood Glucose Monitoring Suppl (ACCU-CHEK GUIDE) w/Device KIT PLEASE SEE  ATTACHED FOR DETAILED DIRECTIONS     Blood Glucose Monitoring Suppl DEVI Check BGs up to twice daily.  E11.9 Wants One TOuch May substitute to any manufacturer covered by patient's insurance. 1 each 0   carvedilol  (COREG ) 3.125 MG tablet Take 1 tablet (3.125 mg total) by mouth 2 (two) times daily. 180 tablet 3   dapagliflozin  propanediol (FARXIGA ) 10 MG TABS tablet Take 1 tablet (10 mg total) by mouth daily before  breakfast. 90 tablet 3   glucose blood (ACCU-CHEK GUIDE TEST) test strip Check BGS up to twice daily Dx E11.9 200 strip 3   Lancet Device MISC Check BGs daily as directed. E11.9. May substitute to any manufacturer covered by patient's insurance. 1 each 0   Lancets Misc. (ACCU-CHEK SOFTCLIX LANCET DEV) KIT PLEASE SEE ATTACHED FOR DETAILED DIRECTIONS     Lancets Misc. MISC Check BGs up to twice daily. E11.9 May substitute to any manufacturer covered by patient's insurance. 100 each PRN   losartan  (COZAAR ) 50 MG tablet Take 1 tablet (50 mg total) by mouth daily. 90 tablet 3   megestrol  (MEGACE ) 40 MG tablet TAKE 1 TABLET (40 MG TOTAL) BY MOUTH 2 (TWO) TIMES DAILY. TAKE 3 TABLETS EVERY DAY 270 tablet 2   Multiple Vitamin (MULTI-VITAMIN) tablet Take 1 tablet by mouth daily.     nystatin  cream (MYCOSTATIN ) Apply 1 Application topically 2 (two) times daily. To area between breast for rash x7-14 days 30 g 0   pantoprazole  (PROTONIX ) 40 MG tablet TAKE 1 TABLET BY MOUTH 30 MINUTES PRIOR TO BREAKFAST AND SUPPER, 180 tablet 3   potassium chloride  (KLOR-CON ) 10 MEQ tablet TAKE 1 TABLET BY MOUTH EVERY DAY 90 tablet 1   pravastatin  (PRAVACHOL ) 40 MG tablet Take 1 tablet (40 mg total) by mouth daily. 90 tablet 3   sitaGLIPtin  (JANUVIA ) 50 MG tablet Take 1 tablet (50 mg total) by mouth daily. Take with Farxiga  daily for sugar. 90 tablet 3   tamoxifen  (NOLVADEX ) 20 MG tablet TAKE 1 TABLET BY MOUTH EVERY DAY 90 tablet 3   No current facility-administered medications for this visit.    PHYSICAL EXAMINATION: ECOG PERFORMANCE STATUS: 1 - Symptomatic but completely ambulatory  Vitals:   01/07/24 1012  BP: 132/78  Pulse: 89  Resp: 16  Temp: 99.1 F (37.3 C)  SpO2: 97%   Filed Weights   01/07/24 1012  Weight: 156 lb 8 oz (71 kg)      LABORATORY DATA:  I have reviewed the data as listed    Latest Ref Rng & Units 12/31/2023   12:12 PM 12/31/2023   12:01 PM 07/23/2023   11:08 AM  CMP  Glucose 70 - 99  mg/dL   851   BUN 6 - 20 mg/dL   17   Creatinine 9.55 - 1.00 mg/dL 9.49  <9.79  9.37   Sodium 135 - 145 mmol/L   137   Potassium 3.5 - 5.1 mmol/L   4.1   Chloride 98 - 111 mmol/L   106   CO2 22 - 32 mmol/L   25   Calcium 8.9 - 10.3 mg/dL   9.0   Total Protein 6.5 - 8.1 g/dL   7.6   Total Bilirubin 0.0 - 1.2 mg/dL   0.4   Alkaline Phos 38 - 126 U/L   51   AST 15 - 41 U/L   13   ALT 0 - 44 U/L   13     Lab Results  Component Value Date  WBC 8.3 07/23/2023   HGB 13.2 07/23/2023   HCT 39.5 07/23/2023   MCV 92.3 07/23/2023   PLT 298 07/23/2023   NEUTROABS 5.6 07/23/2023    ASSESSMENT & PLAN:  Malignant neoplasm of upper-outer quadrant of right breast in female, estrogen receptor positive (HCC) 05/01/18: Bilateral mastectomies: Left mastectomy: DCIS intermediate grade 0.9 cm margins negative Tis NX stage 0; right mastectomy: ILC, grade 3, 3.5 cm, with LCIS, perineural invasion present, margins negative, 15/22 lymph nodes positive with extracapsular extension, ER 90%, PR 0%, HER-2 +(IHC 3+), Ki-67 5%, T2N3M1 Stage IV   06/03/2018 bone scan: Increased tracer uptake in the lumbar spine at L1, L3 and L4, right fourth and seventh ribs, inferior right scapula and distal sternum, T7 and T11 as well.   CT CAP showed widespread bone metastases with possible pathologic fracture at L3, peripheral right upper lobe nodules nonspecific Patient has cerebral palsy and her sister is the power of attorney for healthcare Treatment plan: Palliative treatment with Taxol  x12 cycles completed 08/26/2018, Kingenti, Perjeta  ------------------------------------------------------------------------------------------------------------------------ PET CT scan: 10/27/2018: Widespread bone metastatic disease demonstrates reduced with sclerosis and low metabolic activity with SUV of 3-4.  Left level 4 lymph node 0.5 cm and left deep parotid lymph node SUV 4.1 will need follow-up.  Uterine leiomyomas. PET/CT 11/11/2019:  Continued mild hypermetabolic activity left supraclavicular lymph node 0.7 cm, old sclerotic bone lesions are stable and not hypermetabolic.  Uterine fibroids   CT CAP 05/27/2022: Similarly treated bone metastases.  Pulmonary nodules felt to be similar CT CAP 12/23/2022: Stable findings without any new or progressive disease.  Stable treated bone metastases.  Stable lung nodules favored to be benign.  Uterine fibroid CT CAP 07/17/2023: No interval change   Current treatment: Herceptin  (biosimilar) maintenance along with Tamoxifen    Tamoxifen  toxicities: Denies any hot flashes or myalgias. Herceptin  toxicities: None Echocardiograms every 6 months.  She follows with Dr. Ladona with cardiology.  Continue Herceptin  and Tamoxifen  as scheduled, patient is tolerating well.  CT CAP 12/31/2023: Pending.  To my review of the lung nodules appear to be stable.  Bone metastases also appear to be stable.  We will wait for the radiology read. ------------------------------------- Assessment and Plan Assessment & Plan Metastatic breast cancer with bone involvement CT scan indicated stable disease with unchanged nodule and improved lung inflammation. Awaiting radiology report for confirmation. - Continue current treatment plan. - Schedule echocardiogram every six months unless advised otherwise by cardiologist.      No orders of the defined types were placed in this encounter.  The patient has a good understanding of the overall plan. she agrees with it. she will call with any problems that may develop before the next visit here. Total time spent: 30 mins including face to face time and time spent for planning, charting and co-ordination of care   Naomi MARLA Chad, MD 01/07/24

## 2024-01-07 NOTE — Progress Notes (Signed)
 Per Dr. Gudena, echos are done every 6 months now. Last echo was 09/24/23 (55-60%).

## 2024-01-07 NOTE — Patient Instructions (Signed)
 [facility] - A DEPT OF [facility]  Discharge Instructions: Thank you for choosing [facility] to provide your oncology and hematology care.   If you have a lab appointment with the Cancer Center, please go directly to the Cancer Center and check in at the registration area.   Wear comfortable clothing and clothing appropriate for easy access to any Portacath or PICC line.   We strive to give you quality time with your provider. You may need to reschedule your appointment if you arrive late (15 or more minutes).  Arriving late affects you and other patients whose appointments are after yours.  Also, if you miss three or more appointments without notifying the office, you may be dismissed from the clinic at the provider's discretion.      For prescription refill requests, have your pharmacy contact our office and allow 72 hours for refills to be completed.    Today you received the following chemotherapy and/or immunotherapy agents: Trastuzumab -anns (Kanjinti )    To help prevent nausea and vomiting after your treatment, we encourage you to take your nausea medication as directed.  BELOW ARE SYMPTOMS THAT SHOULD BE REPORTED IMMEDIATELY: *FEVER GREATER THAN 100.4 F (38 C) OR HIGHER *CHILLS OR SWEATING *NAUSEA AND VOMITING THAT IS NOT CONTROLLED WITH YOUR NAUSEA MEDICATION *UNUSUAL SHORTNESS OF BREATH *UNUSUAL BRUISING OR BLEEDING *URINARY PROBLEMS (pain or burning when urinating, or frequent urination) *BOWEL PROBLEMS (unusual diarrhea, constipation, pain near the anus) TENDERNESS IN MOUTH AND THROAT WITH OR WITHOUT PRESENCE OF ULCERS (sore throat, sores in mouth, or a toothache) UNUSUAL RASH, SWELLING OR PAIN  UNUSUAL VAGINAL DISCHARGE OR ITCHING   Items with * indicate a potential emergency and should be followed up as soon as possible or go to the Emergency Department if any problems should occur.  Please show the CHEMOTHERAPY ALERT CARD  or IMMUNOTHERAPY ALERT CARD at check-in to the Emergency Department and triage nurse.  Should you have questions after your visit or need to cancel or reschedule your appointment, please contact [facility] - A DEPT OF [facility]  Dept: [phone]  and follow the prompts.  Office hours are 8:00 a.m. to 4:30 p.m. Monday - Friday. Please note that voicemails left after 4:00 p.m. may not be returned until the following business day.  We are closed weekends and major holidays. You have access to a nurse at all times for urgent questions. Please call the main number to the clinic Dept: [phone] and follow the prompts.   For any non-urgent questions, you may also contact your provider using MyChart. We now offer e-Visits for anyone 57 and older to request care online for non-urgent symptoms. For details visit [email or web address].   Also download the MyChart app! Go to the app store, search MyChart, open the app, select [facility], and log in with your MyChart username and password.

## 2024-01-08 ENCOUNTER — Other Ambulatory Visit: Payer: Self-pay

## 2024-01-09 ENCOUNTER — Other Ambulatory Visit: Payer: Self-pay

## 2024-01-12 ENCOUNTER — Ambulatory Visit
Admission: EM | Admit: 2024-01-12 | Discharge: 2024-01-12 | Disposition: A | Discharge status: Home / Self Care | Attending: Family Medicine | Admitting: Family Medicine

## 2024-01-12 DIAGNOSIS — W57XXXA Bitten or stung by nonvenomous insect and other nonvenomous arthropods, initial encounter: Secondary | ICD-10-CM

## 2024-01-12 DIAGNOSIS — L0291 Cutaneous abscess, unspecified: Secondary | ICD-10-CM

## 2024-01-12 DIAGNOSIS — S40861A Insect bite (nonvenomous) of right upper arm, initial encounter: Secondary | ICD-10-CM

## 2024-01-12 MED ORDER — CHLORHEXIDINE GLUCONATE 4 % EX SOLN: Freq: Every day | CUTANEOUS | 0 refills | Status: DC | PRN

## 2024-01-12 MED ORDER — BACITRACIN 500 UNIT/GM EX OINT: 1.0000 | TOPICAL_OINTMENT | Freq: Two times a day (BID) | CUTANEOUS | Status: DC

## 2024-01-12 MED ORDER — MUPIROCIN 2 % EX OINT
1.0000 | TOPICAL_OINTMENT | Freq: Two times a day (BID) | CUTANEOUS | 0 refills | Status: DC
Start: 2024-01-12 — End: 2024-02-18

## 2024-01-12 MED ORDER — DOXYCYCLINE HYCLATE 100 MG PO CAPS: 100.0000 mg | ORAL_CAPSULE | Freq: Two times a day (BID) | ORAL | 0 refills | Status: DC

## 2024-01-12 NOTE — ED Triage Notes (Signed)
 Per Care giver pt was bitten by something last Tuesday, and its now draining, has heat to the touch, and is causing pain in the arm.  has not tried any OTC meds.

## 2024-01-12 NOTE — Discharge Instructions (Signed)
 Clean the area once to twice daily with the Hibiclens  and apply the mupirocin  ointment and a nonstick dressing.  Keep it clean and covered at all times until it is fully healed.  Take the full course of antibiotics.  Follow-up for worsening or unresolving symptoms.

## 2024-01-12 NOTE — ED Notes (Signed)
 Unable to obtain pt's vitals.

## 2024-01-12 NOTE — ED Provider Notes (Signed)
 [facility]    CSN: [id number] Arrival date & time: 01/12/24  1420      History   Chief Complaint No chief complaint on file.   HPI [name] is a 58 y.o. female.   Patient presenting today with caregiver who provides most of the history for patient.  She states there was an insect bite about a week ago to the right upper arm, initially just itchy and swollen but now draining, more swollen, warm to the touch and significantly painful to patient.  They deny notice of fever, body aches, nausea, vomiting.  Has been trying to keep it covered since it has been draining but otherwise not trying anything over-the-counter for symptoms.  She does have a history of diabetes.    Past Medical History:  Diagnosis Date   Arthritis    Cerebral palsy (HCC)    Complication of anesthesia 12/09/2013   Respiratory instability with 10 mg Propofol .Poor gag reflex.   Constipation - functional    Dysphagia 2003   2O to GERD/HH, ?difficulty with large food bolus due to neuromuscular weakness   Epigastric pain MAY 2012 ABD US  NL GBLIVPAN   2o to GERD V. 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/2018   Pre-diabetes 12/24/2016   Healthcare maintenance 12/26/2014   Headache 12/26/2014   Frequency of micturition 09/03/2013   Need for Tdap vaccination 09/03/2013   Pain in limb 04/12/2013   Wart viral 04/12/2013   HLD  (hyperlipidemia) 09/16/2012   Vitamin D  deficiency 09/16/2012   Hyperglycemia 09/16/2012   HTN (hypertension) 09/16/2012   ADENOMATOUS COLONIC POLYP 11/21/2009   Disorder of teeth and supporting structures 07/11/2009   Infantile cerebral palsy (HCC) 11/23/2008   GERD 11/23/2008   Constipation 11/23/2008   Dysphagia 11/23/2008    Past Surgical History:  Procedure Laterality Date   COLONOSCOPY  APR 2009 with proprofol   SLF: SIMPLE ADENOMAS   MASTECTOMY W/ SENTINEL NODE BIOPSY Bilateral 05/01/2018   Procedure: BILATERAL MASTECTOMIES WITH LEFT SENTINEL LYMPH NODE BIOPSIES AND RIGHT AXILLARY LYMPH NODE DISSECTION;  Surgeon: Ethyl Lenis, MD;  Location: [facility] OR;  Service: General;  Laterality: Bilateral;   PORTACATH PLACEMENT N/A 05/01/2018   Procedure: INSERTION PORT-A-CATH WITH US ;  Surgeon: Ethyl Lenis, MD;  Location: [facility] OR;  Service: General;  Laterality: N/A;   UPPER GASTROINTESTINAL ENDOSCOPY  MAY 2012 DIL 16 mm   SLF: NO DEFINITE STRICTURE APPRECIATED   UPPER GASTROINTESTINAL ENDOSCOPY  FEB 2009-HYPOXIA REQUIRING NARCAN, & VERSED , D50V4   SLF: NL ESO, FUNDIC GLAND POLYPS   UPPER GASTROINTESTINAL ENDOSCOPY  w/ DIL 2003, 2004, 2006    OB History   No obstetric history on file.      Home Medications    Prior to Admission medications   Medication Sig Start Date End Date Taking? Authorizing Provider  chlorhexidine  (HIBICLENS ) 4 % external liquid Apply topically daily  as needed. 01/12/24  Yes Stuart Vernell Norris, PA-C  doxycycline  (VIBRAMYCIN ) 100 MG capsule Take 1 capsule (100 mg total) by mouth 2 (two) times daily. 01/12/24  Yes Stuart Vernell Norris, PA-C  mupirocin  ointment (BACTROBAN ) 2 % Apply 1 Application topically 2 (two) times daily. 01/12/24  Yes Stuart Vernell Norris, PA-C  Accu-Chek Softclix Lancets lancets Check BGS up to twice daily Dx E11.9 12/03/23   Jolinda Potter M, DO  Blood Glucose Monitoring Suppl (ACCU-CHEK GUIDE) w/Device KIT PLEASE SEE ATTACHED FOR  DETAILED DIRECTIONS 10/21/22   [provider]  Blood Glucose Monitoring Suppl DEVI Check BGs up to twice daily.  E11.9 Wants One TOuch May substitute to any manufacturer covered by patient's insurance. 10/09/22   Jolinda Potter HERO, DO  carvedilol  (COREG ) 3.125 MG tablet Take 1 tablet (3.125 mg total) by mouth 2 (two) times daily. 06/24/23   Jolinda Potter HERO, DO  dapagliflozin  propanediol (FARXIGA ) 10 MG TABS tablet Take 1 tablet (10 mg total) by mouth daily before breakfast. 06/24/23   Jolinda Potter M, DO  glucose blood (ACCU-CHEK GUIDE TEST) test strip Check BGS up to twice daily Dx E11.9 12/03/23   Jolinda Potter HERO, DO  Lancet Device MISC Check BGs daily as directed. E11.9. May substitute to any manufacturer covered by patient's insurance. 10/09/22   Jolinda Potter HERO, DO  Lancets Misc. (ACCU-CHEK SOFTCLIX LANCET DEV) KIT PLEASE SEE ATTACHED FOR DETAILED DIRECTIONS 10/21/22   [provider]  Lancets Misc. MISC Check BGs up to twice daily. E11.9 May substitute to any manufacturer covered by patient's insurance. 10/09/22   Jolinda Potter HERO, DO  losartan  (COZAAR ) 50 MG tablet Take 1 tablet (50 mg total) by mouth daily. 06/24/23   Jolinda Potter HERO, DO  megestrol  (MEGACE ) 40 MG tablet TAKE 1 TABLET (40 MG TOTAL) BY MOUTH 2 (TWO) TIMES DAILY. TAKE 3 TABLETS EVERY DAY 06/03/23   Gudena, Vinay, MD  Multiple Vitamin (MULTI-VITAMIN) tablet Take 1 tablet by mouth daily.    [provider]  nystatin  cream (MYCOSTATIN ) Apply 1 Application topically 2 (two) times daily. To area between breast for rash x7-14 days 10/09/22   Jolinda Potter HERO, DO  pantoprazole  (PROTONIX ) 40 MG tablet TAKE 1 TABLET BY MOUTH 30 MINUTES PRIOR TO BREAKFAST AND SUPPER, 11/20/23   Gudena, Vinay, MD  potassium chloride  (KLOR-CON ) 10 MEQ tablet TAKE 1 TABLET BY MOUTH EVERY DAY 08/25/23   Odean Potts, MD  pravastatin  (PRAVACHOL ) 40 MG tablet Take 1 tablet (40 mg total) by mouth daily. 06/24/23    Jolinda Potter HERO, DO  sitaGLIPtin  (JANUVIA ) 50 MG tablet Take 1 tablet (50 mg total) by mouth daily. Take with Farxiga  daily for sugar. 06/24/23   Jolinda Potter HERO, DO  tamoxifen  (NOLVADEX ) 20 MG tablet TAKE 1 TABLET BY MOUTH EVERY DAY 05/29/23   Odean Potts, MD  prochlorperazine  (COMPAZINE ) 10 MG tablet Take 1 tablet (10 mg total) by mouth every 6 (six) hours as needed (Nausea or vomiting). 05/11/18 06/04/18  Odean Potts, MD    Family History Family History  Problem Relation Age of Onset   Hypertension Mother    Diabetes Mother    Colon cancer Mother        LATE 50S   Hypertension Brother    Diabetes Brother    Diabetes Maternal Grandmother    Diabetes Maternal Grandfather    Hypertension Maternal Grandfather     Social History Social History   Tobacco Use   Smoking status: Never   Smokeless tobacco:  Never  Substance Use Topics   Alcohol use: No   Drug use: Yes    Types: PCP     Allergies   Hydrocodone    Review of Systems Review of Systems Per HPI  Physical Exam Triage Vital Signs ED Triage Vitals  Encounter Vitals Group     BP --      Girls Systolic BP Percentile --      Girls Diastolic BP Percentile --      Boys Systolic BP Percentile --      Boys Diastolic BP Percentile --      Pulse --      Resp --      Temp 01/12/24 1544 98.2 F (36.8 C)     Temp Source 01/12/24 1544 Temporal     SpO2 --      Weight --      Height --      Head Circumference --      Peak Flow --      Pain Score 01/12/24 1530 8     Pain Loc --      Pain Education --      Exclude from Growth Chart --    No data found.  Updated Vital Signs Temp 98.2 F (36.8 C) (Temporal)   Visual Acuity Right Eye Distance:   Left Eye Distance:   Bilateral Distance:    Right Eye Near:   Left Eye Near:    Bilateral Near:     Physical Exam Vitals and nursing note reviewed.  Constitutional:      Appearance: Normal appearance. She is not ill-appearing.  HENT:     Head:  Atraumatic.  Eyes:     Extraocular Movements: Extraocular movements intact.     Conjunctiva/sclera: Conjunctivae normal.  Cardiovascular:     Rate and Rhythm: Normal rate.  Pulmonary:     Effort: Pulmonary effort is normal.  Musculoskeletal:     Cervical back: Normal range of motion and neck supple.     Comments: At baseline  Skin:    General: Skin is warm.     Comments: Draining pustular abscess to the right upper arm, tender to palpation  Neurological:     Mental Status: She is alert.     Comments: At baseline  Psychiatric:        Behavior: Behavior normal.      UC Treatments / Results  Labs (all labs ordered are listed, but only abnormal results are displayed) Labs Reviewed - No data to display  EKG   Radiology No results found.  Procedures Procedures (including critical care time)  Medications Ordered in UC Medications  bacitracin  ointment 1 Application (has no administration in time range)    Initial Impression / Assessment and Plan / UC Course  I have reviewed the triage vital signs and the nursing notes.  Pertinent labs & imaging results that were available during my care of the patient were reviewed by me and considered in my medical decision making (see chart for details).     Wound cleaned and dressed prior to discharge, will treat with doxycycline , Hibiclens , mupirocin , good home wound care, warm compresses.  Return for worsening symptoms.  Final Clinical Impressions(s) / UC Diagnoses   Final diagnoses:  Abscess  Insect bite of right upper arm, initial encounter     Discharge Instructions      Clean the area once to twice daily with the Hibiclens  and apply the mupirocin  ointment and a nonstick dressing.  Keep it  clean and covered at all times until it is fully healed.  Take the full course of antibiotics.  Follow-up for worsening or unresolving symptoms.    ED Prescriptions     Medication Sig Dispense Auth. Provider   doxycycline   (VIBRAMYCIN ) 100 MG capsule Take 1 capsule (100 mg total) by mouth 2 (two) times daily. 14 capsule Stuart Vernell Norris, PA-C   chlorhexidine  (HIBICLENS ) 4 % external liquid Apply topically daily as needed. 236 mL Stuart Vernell Norris, PA-C   mupirocin  ointment (BACTROBAN ) 2 % Apply 1 Application topically 2 (two) times daily. 60 g Stuart Vernell Norris, [place]      PDMP not reviewed this encounter.   Stuart Vernell Norris, PA-C 01/12/24 [phone]

## 2024-01-21 ENCOUNTER — Ambulatory Visit: Payer: 59 | Admitting: Family Medicine

## 2024-01-21 ENCOUNTER — Encounter: Payer: Self-pay | Admitting: Family Medicine

## 2024-01-21 VITALS — BP 142/82 | HR 75 | Temp 96.5°F | Ht 61.0 in | Wt 156.0 lb

## 2024-01-21 DIAGNOSIS — E1159 Type 2 diabetes mellitus with other circulatory complications: Secondary | ICD-10-CM | POA: Diagnosis not present

## 2024-01-21 DIAGNOSIS — Z0001 Encounter for general adult medical examination with abnormal findings: Secondary | ICD-10-CM | POA: Diagnosis not present

## 2024-01-21 DIAGNOSIS — Z7984 Long term (current) use of oral hypoglycemic drugs: Secondary | ICD-10-CM | POA: Diagnosis not present

## 2024-01-21 DIAGNOSIS — I152 Hypertension secondary to endocrine disorders: Secondary | ICD-10-CM | POA: Diagnosis not present

## 2024-01-21 DIAGNOSIS — E1169 Type 2 diabetes mellitus with other specified complication: Secondary | ICD-10-CM | POA: Diagnosis not present

## 2024-01-21 DIAGNOSIS — C7951 Secondary malignant neoplasm of bone: Secondary | ICD-10-CM | POA: Diagnosis not present

## 2024-01-21 DIAGNOSIS — E1121 Type 2 diabetes mellitus with diabetic nephropathy: Secondary | ICD-10-CM

## 2024-01-21 DIAGNOSIS — G809 Cerebral palsy, unspecified: Secondary | ICD-10-CM

## 2024-01-21 DIAGNOSIS — Z23 Encounter for immunization: Secondary | ICD-10-CM

## 2024-01-21 DIAGNOSIS — Z170 Estrogen receptor positive status [ER+]: Secondary | ICD-10-CM

## 2024-01-21 DIAGNOSIS — C50411 Malignant neoplasm of upper-outer quadrant of right female breast: Secondary | ICD-10-CM | POA: Diagnosis not present

## 2024-01-21 DIAGNOSIS — Z6379 Other stressful life events affecting family and household: Secondary | ICD-10-CM

## 2024-01-21 DIAGNOSIS — E785 Hyperlipidemia, unspecified: Secondary | ICD-10-CM

## 2024-01-21 DIAGNOSIS — Z0000 Encounter for general adult medical examination without abnormal findings: Secondary | ICD-10-CM

## 2024-01-21 DIAGNOSIS — E559 Vitamin D deficiency, unspecified: Secondary | ICD-10-CM

## 2024-01-21 DIAGNOSIS — E119 Type 2 diabetes mellitus without complications: Secondary | ICD-10-CM | POA: Insufficient documentation

## 2024-01-21 LAB — BAYER DCA HB A1C WAIVED: HB A1C (BAYER DCA - WAIVED): 6.8 % — ABNORMAL HIGH (ref 4.8–5.6)

## 2024-01-21 MED ORDER — LOSARTAN POTASSIUM 50 MG PO TABS: 50.0000 mg | ORAL_TABLET | Freq: Every day | ORAL | 3 refills | Status: AC

## 2024-01-21 MED ORDER — SITAGLIPTIN PHOSPHATE 50 MG PO TABS: 50.0000 mg | ORAL_TABLET | Freq: Every day | ORAL | 3 refills | Status: AC

## 2024-01-21 MED ORDER — PRAVASTATIN SODIUM 40 MG PO TABS: 40.0000 mg | ORAL_TABLET | Freq: Every day | ORAL | 3 refills | Status: AC

## 2024-01-21 MED ORDER — CARVEDILOL 3.125 MG PO TABS: 3.1250 mg | ORAL_TABLET | Freq: Two times a day (BID) | ORAL | 3 refills | Status: AC

## 2024-01-21 MED ORDER — DAPAGLIFLOZIN PROPANEDIOL 10 MG PO TABS: 10.0000 mg | ORAL_TABLET | Freq: Every day | ORAL | 3 refills | Status: AC

## 2024-01-21 NOTE — Progress Notes (Signed)
 [name] is a 58 y.o. female who is brought to the office by her sister, Ivette today.  She presents to office today for annual physical exam examination.    Discussed the use of AI scribe software for clinical note transcription with the patient, who gave verbal consent to proceed.  History of Present Illness   [name] Gazzola is a 58 year old female who presents for follow-up after treatment for a major infection.  She was bitten by an unknown insect, resulting in a circular red lesion with a black dot in the middle on her right arm. The lesion worsened over time, becoming painful and eventually oozing. She was treated with doxycycline , which she completed without any gastrointestinal side effects. The infection has since resolved.  She experiences occasional chest pain which seems to be positional. No issues with vision, hearing, swallowing, urination, or bowel movements. No new skin lesions, and her caregiver regularly checks her skin due to her diabetes.  She has fibroids that cause pain but do not bleed. She has not experienced any abnormal vaginal bleeding or blood in her stool. No anxiety or depression, although she has been feeling down due to concerns about her nephew's legal troubles.      Occupation: disable, Marital status: single, Substance use: none Health Maintenance Due  Topic Date Due   OPHTHALMOLOGY EXAM  Never done   Zoster Vaccines- Shingrix (1 of 2) Never done   FOOT EXAM  10/09/2023   Medicare Annual Wellness (AWV)  11/06/2023   HEMOGLOBIN A1C  12/22/2023   Refills needed today: all  Immunization History  Administered Date(s) Administered   Influenza Inj Mdck Quad Pf 03/04/2017   Influenza, Seasonal, Injecte, Preservative Fre 02/10/2023, 01/21/2024   Influenza,inj,Quad PF,6+ Mos 04/12/2013, 02/06/2015, 02/01/2016, 02/03/2019, 02/10/2020, 02/21/2021, 03/06/2022   Moderna Sars-Covid-2 Vaccination 08/13/2019, 09/23/2019   PFIZER(Purple Top)SARS-COV-2  Vaccination 02/10/2020   PNEUMOCOCCAL CONJUGATE-20 01/21/2024   Tdap 09/03/2013, 01/21/2024   Past Medical History:  Diagnosis Date   Arthritis    Cerebral palsy (HCC)    Complication of anesthesia 12/09/2013   Respiratory instability with 10 mg Propofol .Poor gag reflex.   Constipation - functional    Dysphagia 2003   2O to GERD/HH, ?difficulty with large food bolus due to neuromuscular weakness   Epigastric pain MAY 2012 ABD US  NL GBLIVPAN   2o to GERD V. 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    Social History   Socioeconomic History   Marital status: Single    Spouse name: Not on file   Number of children: Not on file   Years of education: Not on file   Highest education level: 12th grade  Occupational History   Not on file  Tobacco Use   Smoking status: Never   Smokeless tobacco: Never  Substance and Sexual Activity   Alcohol use: No   Drug use: Yes    Types: PCP   Sexual activity: Not Currently    Birth control/protection: None  Other Topics Concern   Not on file  Social History Narrative   Not on file   Social Drivers of Health   Financial Resource Strain: Low Risk  (01/20/2024)   Overall Financial Resource Strain (CARDIA)  Difficulty of Paying Living Expenses: Not very hard  Food Insecurity: No Food Insecurity (01/20/2024)   Hunger Vital Sign    Worried About Running Out of Food in the Last Year: Never true    Ran Out of Food in the Last Year: Never true  Transportation Needs: No Transportation Needs (01/20/2024)   PRAPARE - Administrator, Civil Service (Medical): No    Lack of Transportation (Non-Medical): No  Physical Activity: Inactive (01/20/2024)   Exercise Vital Sign    Days of Exercise per Week: 0 days    Minutes of Exercise per  Session: Not on file  Stress: No Stress Concern Present (01/20/2024)   [employer] of Occupational Health - Occupational Stress Questionnaire    Feeling of Stress: Only a little  Social Connections: Moderately Integrated (01/20/2024)   Social Connection and Isolation Panel    Frequency of Communication with Friends and Family: More than three times a week    Frequency of Social Gatherings with Friends and Family: Three times a week    Attends Religious Services: More than 4 times per year    Active Member of Clubs or Organizations: Yes    Attends Banker Meetings: More than 4 times per year    Marital Status: Never married  Intimate Partner Violence: Not At Risk (11/06/2022)   Humiliation, Afraid, Rape, and Kick questionnaire    Fear of Current or Ex-Partner: No    Emotionally Abused: No    Physically Abused: No    Sexually Abused: No   Past Surgical History:  Procedure Laterality Date   COLONOSCOPY  APR 2009 with proprofol   SLF: SIMPLE ADENOMAS   MASTECTOMY W/ SENTINEL NODE BIOPSY Bilateral 05/01/2018   Procedure: BILATERAL MASTECTOMIES WITH LEFT SENTINEL LYMPH NODE BIOPSIES AND RIGHT AXILLARY LYMPH NODE DISSECTION;  Surgeon: Ethyl Lenis, MD;  Location: [facility];  Service: General;  Laterality: Bilateral;   PORTACATH PLACEMENT N/A 05/01/2018   Procedure: INSERTION PORT-A-CATH WITH US ;  Surgeon: Ethyl Lenis, MD;  Location: [facility];  Service: General;  Laterality: N/A;   UPPER GASTROINTESTINAL ENDOSCOPY  MAY 2012 DIL 16 mm   SLF: NO DEFINITE STRICTURE APPRECIATED   UPPER GASTROINTESTINAL ENDOSCOPY  FEB 2009-HYPOXIA REQUIRING NARCAN, & VERSED , D50V4   SLF: NL ESO, FUNDIC GLAND POLYPS   UPPER GASTROINTESTINAL ENDOSCOPY  w/ DIL 2003, 2004, 2006   Family History  Problem Relation Age of Onset   Hypertension Mother    Diabetes Mother    Colon cancer Mother        LATE 50S   Hypertension Brother    Diabetes Brother    Diabetes Maternal Grandmother    Diabetes Maternal  Grandfather    Hypertension Maternal Grandfather     Current Outpatient Medications:    Accu-Chek Softclix Lancets lancets, Check BGS up to twice daily Dx E11.9, Disp: 200 each, Rfl: 3   glucose blood (ACCU-CHEK GUIDE TEST) test strip, Check BGS up to twice daily Dx E11.9, Disp: 200 strip, Rfl: 3   Lancets Misc. MISC, Check BGs up to twice daily. E11.9 May substitute to any manufacturer covered by patient's insurance., Disp: 100 each, Rfl: PRN   megestrol  (MEGACE ) 40 MG tablet, TAKE 1 TABLET (40 MG TOTAL) BY MOUTH 2 (TWO) TIMES DAILY. TAKE 3 TABLETS EVERY DAY, Disp: 270 tablet, Rfl: 2   Multiple Vitamin (MULTI-VITAMIN) tablet, Take 1 tablet by mouth daily., Disp: , Rfl:    mupirocin  ointment (BACTROBAN ) 2 %, Apply 1 Application topically 2 (  two) times daily., Disp: 60 g, Rfl: 0   nystatin  cream (MYCOSTATIN ), Apply 1 Application topically 2 (two) times daily. To area between breast for rash x7-14 days, Disp: 30 g, Rfl: 0   pantoprazole  (PROTONIX ) 40 MG tablet, TAKE 1 TABLET BY MOUTH 30 MINUTES PRIOR TO BREAKFAST AND SUPPER,, Disp: 180 tablet, Rfl: 3   potassium chloride  (KLOR-CON ) 10 MEQ tablet, TAKE 1 TABLET BY MOUTH EVERY DAY, Disp: 90 tablet, Rfl: 1   tamoxifen  (NOLVADEX ) 20 MG tablet, TAKE 1 TABLET BY MOUTH EVERY DAY, Disp: 90 tablet, Rfl: 3   carvedilol  (COREG ) 3.125 MG tablet, Take 1 tablet (3.125 mg total) by mouth 2 (two) times daily., Disp: 180 tablet, Rfl: 3   chlorhexidine  (HIBICLENS ) 4 % external liquid, Apply topically daily as needed. (Patient not taking: Reported on 01/21/2024), Disp: 236 mL, Rfl: 0   dapagliflozin  propanediol (FARXIGA ) 10 MG TABS tablet, Take 1 tablet (10 mg total) by mouth daily before breakfast., Disp: 90 tablet, Rfl: 3   losartan  (COZAAR ) 50 MG tablet, Take 1 tablet (50 mg total) by mouth daily., Disp: 90 tablet, Rfl: 3   pravastatin  (PRAVACHOL ) 40 MG tablet, Take 1 tablet (40 mg total) by mouth daily., Disp: 90 tablet, Rfl: 3   sitaGLIPtin  (JANUVIA ) 50 MG  tablet, Take 1 tablet (50 mg total) by mouth daily. Take with Farxiga  daily for sugar., Disp: 90 tablet, Rfl: 3  Allergies  Allergen Reactions   Hydrocodone  Nausea And Vomiting and Other (See Comments)    Vomiting and hallunications     ROS: Review of Systems Pertinent items noted in HPI and remainder of comprehensive ROS otherwise negative.    Physical exam BP (!) 142/82   Pulse 75   Temp (!) 96.5 F (35.8 C)   Ht 5' 1 (1.549 m)   Wt 156 lb (70.8 kg)   SpO2 96%   BMI 29.48 kg/m  General appearance: alert, cooperative, appears stated age, and no distress Head: Normocephalic, without obvious abnormality, atraumatic Eyes: sclera white, PERRL Ears: normal TM's and external ear canals both ears Nose: Nares normal. Septum midline. Mucosa normal. No drainage or sinus tenderness. Throat: MMM, dentition poor Neck: no adenopathy, no carotid bruit, supple, symmetrical, trachea midline, and thyroid  not enlarged, symmetric, no tenderness/mass/nodules Back: scoliotic deformity. Hunched station Lungs: clear to auscultation bilaterally Breasts: surgically absent Heart: regular rate and rhythm, S1, S2 normal, no murmur, click, rub or gallop Abdomen: soft, ND, mild generalized TTP Extremities: poor tone. Plantar flexion bilaterally.  Pulses: 2+ and symmetric Skin: healing insect bite on right posterior upper arm Lymph nodes: Cervical, supraclavicular, and axillary nodes normal. Neurologic: speech difficulty but responds to questions appropriately.      01/21/2024   11:24 AM 06/24/2023    9:19 AM 11/06/2022    9:59 AM  Depression screen PHQ 2/9  Decreased Interest 0 0 1  Down, Depressed, Hopeless 0 0 0  PHQ - 2 Score 0 0 1  Altered sleeping 0 0   Tired, decreased energy 0 0   Change in appetite 0 0   Feeling bad or failure about yourself  0 0   Trouble concentrating 0 0   Moving slowly or fidgety/restless 0 0   Suicidal thoughts 0 0   PHQ-9 Score 0 0   Difficult doing work/chores  Not difficult at all Not difficult at all       01/21/2024   11:24 AM 06/24/2023    9:19 AM  GAD 7 : Generalized Anxiety Score  Nervous,  Anxious, on Edge 0 0  Control/stop worrying 0 0  Worry too much - different things 0 0  Trouble relaxing 0 0  Restless 0 0  Easily annoyed or irritable 0 0  Afraid - awful might happen 0 0  Total GAD 7 Score 0 0  Anxiety Difficulty Not difficult at all Not difficult at all     Assessment/ Plan: Wonda JONELLE Mulch here for annual physical exam.   Annual physical exam  Stressful life event affecting family  Diabetes mellitus treated with oral medication (HCC) - Plan: Microalbumin / creatinine urine ratio, Bayer DCA Hb A1c Waived, CMP14+EGFR, dapagliflozin  propanediol (FARXIGA ) 10 MG TABS tablet, sitaGLIPtin  (JANUVIA ) 50 MG tablet  Hypertension associated with diabetes (HCC) - Plan: CMP14+EGFR, carvedilol  (COREG ) 3.125 MG tablet, losartan  (COZAAR ) 50 MG tablet  Microalbuminuric diabetic nephropathy (HCC)  Hyperlipidemia associated with type 2 diabetes mellitus (HCC) - Plan: CMP14+EGFR, TSH, pravastatin  (PRAVACHOL ) 40 MG tablet  Infantile cerebral palsy (HCC) - Plan: CMP14+EGFR  Malignant neoplasm of upper-outer quadrant of right breast in female, estrogen receptor positive (HCC) - Plan: CMP14+EGFR, CBC with Differential  Malignant neoplasm metastatic to bone (HCC) - Plan: CMP14+EGFR, CBC with Differential, VITAMIN D  25 Hydroxy (Vit-D Deficiency, Fractures)  Vitamin D  deficiency - Plan: CMP14+EGFR, VITAMIN D  25 Hydroxy (Vit-D Deficiency, Fractures)  Immunization due - Plan: Tdap vaccine greater than or equal to 7yo IM, Pneumococcal conjugate vaccine 20-valent (Prevnar 20)  Encounter for immunization - Plan: Flu vaccine trivalent PF, 6mos and older(Flulaval,Afluria,Fluarix,Fluzone) Assessment and Plan    Symptomatic uterine fibroids Fibroids causing pain, not large enough for surgery. continue to follow up with OB.GYN  Type 2 diabetes mellitus  with nephropathy, HTN, HLD No new symptoms. - Ensure refills for diabetes medications and supplies. - DM foot exam today  Cerebral palsy - well cared for. no wounds or concerns  Adult Wellness Visit - Administer flu, pneumonia, and tetanus vaccines. - continue ongoing follow up with oncology for breast cancer.     Check vit d level.  Counseled on healthy lifestyle choices, including diet (rich in fruits, vegetables and lean meats and low in salt and simple carbohydrates) and exercise (at least 30 minutes of moderate physical activity daily).  Patient to follow up 36m for check up DM  Naleah Kofoed M. Jolinda, DO

## 2024-01-22 ENCOUNTER — Encounter: Payer: Self-pay | Admitting: Hematology and Oncology

## 2024-01-22 ENCOUNTER — Other Ambulatory Visit: Payer: Self-pay

## 2024-01-22 LAB — CBC WITH DIFFERENTIAL/PLATELET
Basophils Absolute: 0.1 x10E3/uL (ref 0.0–0.2)
Basos: 1 %
EOS (ABSOLUTE): 0.1 x10E3/uL (ref 0.0–0.4)
Eos: 1 %
Hematocrit: 40.5 % (ref 34.0–46.6)
Hemoglobin: 12.7 g/dL (ref 11.1–15.9)
Immature Grans (Abs): 0 x10E3/uL (ref 0.0–0.1)
Immature Granulocytes: 0 %
Lymphocytes Absolute: 2.3 x10E3/uL (ref 0.7–3.1)
Lymphs: 29 %
MCH: 29.5 pg (ref 26.6–33.0)
MCHC: 31.4 g/dL — ABNORMAL LOW (ref 31.5–35.7)
MCV: 94 fL (ref 79–97)
Monocytes Absolute: 0.5 x10E3/uL (ref 0.1–0.9)
Monocytes: 7 %
Neutrophils Absolute: 5.1 x10E3/uL (ref 1.4–7.0)
Neutrophils: 62 %
Platelets: 292 x10E3/uL (ref 150–450)
RBC: 4.31 x10E6/uL (ref 3.77–5.28)
RDW: 12.6 % (ref 11.7–15.4)
WBC: 8.1 x10E3/uL (ref 3.4–10.8)

## 2024-01-22 LAB — VITAMIN D 25 HYDROXY (VIT D DEFICIENCY, FRACTURES): Vit D, 25-Hydroxy: 13.5 ng/mL — AB (ref 30.0–100.0)

## 2024-01-22 LAB — CMP14+EGFR
ALT: 10 IU/L (ref 0–32)
AST: 14 IU/L (ref 0–40)
Albumin: 3.7 g/dL — AB (ref 3.8–4.9)
Alkaline Phosphatase: 70 IU/L (ref 44–121)
BUN/Creatinine Ratio: 21 (ref 12–28)
BUN: 13 mg/dL (ref 8–27)
Bilirubin Total: 0.3 mg/dL (ref 0.0–1.2)
CO2: 18 mmol/L — AB (ref 20–29)
Calcium: 8.8 mg/dL (ref 8.7–10.3)
Chloride: 108 mmol/L — AB (ref 96–106)
Creatinine, Ser: 0.62 mg/dL (ref 0.57–1.00)
Globulin, Total: 3.2 g/dL (ref 1.5–4.5)
Glucose: 107 mg/dL — AB (ref 70–99)
Potassium: 3.6 mmol/L (ref 3.5–5.2)
Sodium: 142 mmol/L (ref 134–144)
Total Protein: 6.9 g/dL (ref 6.0–8.5)
eGFR: 102 mL/min/1.73 (ref >59)

## 2024-01-22 LAB — TSH: TSH: 0.757 u[IU]/mL (ref 0.450–4.500)

## 2024-01-26 ENCOUNTER — Ambulatory Visit: Payer: Self-pay | Admitting: Family Medicine

## 2024-01-26 DIAGNOSIS — E559 Vitamin D deficiency, unspecified: Secondary | ICD-10-CM

## 2024-01-26 MED ORDER — VITAMIN D (ERGOCALCIFEROL) 1.25 MG (50000 UNIT) PO CAPS: 50000.0000 [IU] | ORAL_CAPSULE | ORAL | 3 refills | Status: AC

## 2024-02-16 ENCOUNTER — Other Ambulatory Visit: Payer: Self-pay | Admitting: Hematology and Oncology

## 2024-02-18 ENCOUNTER — Inpatient Hospital Stay: Attending: Hematology and Oncology | Admitting: Hematology and Oncology

## 2024-02-18 ENCOUNTER — Inpatient Hospital Stay

## 2024-02-18 VITALS — BP 118/80 | HR 91 | Temp 98.0°F | Resp 18 | Ht 61.0 in | Wt 122.7 lb

## 2024-02-18 DIAGNOSIS — Z5112 Encounter for antineoplastic immunotherapy: Secondary | ICD-10-CM | POA: Insufficient documentation

## 2024-02-18 DIAGNOSIS — Z95828 Presence of other vascular implants and grafts: Secondary | ICD-10-CM

## 2024-02-18 DIAGNOSIS — C50411 Malignant neoplasm of upper-outer quadrant of right female breast: Secondary | ICD-10-CM | POA: Diagnosis not present

## 2024-02-18 DIAGNOSIS — C7951 Secondary malignant neoplasm of bone: Secondary | ICD-10-CM | POA: Diagnosis not present

## 2024-02-18 DIAGNOSIS — Z1731 Human epidermal growth factor receptor 2 positive status: Secondary | ICD-10-CM | POA: Insufficient documentation

## 2024-02-18 DIAGNOSIS — Z170 Estrogen receptor positive status [ER+]: Secondary | ICD-10-CM | POA: Insufficient documentation

## 2024-02-18 DIAGNOSIS — Z1722 Progesterone receptor negative status: Secondary | ICD-10-CM | POA: Insufficient documentation

## 2024-02-18 MED ORDER — ACETAMINOPHEN 325 MG PO TABS
650.0000 mg | ORAL_TABLET | Freq: Once | ORAL | Status: AC
  Administered 2024-02-18: 650 mg via ORAL
  Filled 2024-02-18: qty 2

## 2024-02-18 MED ORDER — DIPHENHYDRAMINE HCL 25 MG PO CAPS
50.0000 mg | ORAL_CAPSULE | Freq: Once | ORAL | Status: AC
  Administered 2024-02-18: 50 mg via ORAL
  Filled 2024-02-18: qty 2

## 2024-02-18 MED ORDER — SODIUM CHLORIDE 0.9 % IV SOLN: Freq: Once | INTRAVENOUS | Status: AC

## 2024-02-18 MED ORDER — TRASTUZUMAB-ANNS CHEMO 150 MG IV SOLR
315.0000 mg | Freq: Once | INTRAVENOUS | Status: AC
  Administered 2024-02-18: 315 mg via INTRAVENOUS
  Filled 2024-02-18: qty 15

## 2024-02-18 MED ORDER — SODIUM CHLORIDE 0.9% FLUSH: 10.0000 mL | INTRAVENOUS | Status: DC | PRN

## 2024-02-18 NOTE — Assessment & Plan Note (Signed)
 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   CT CAP showed widespread bone metastases with possible pathologic fracture at L3, peripheral right upper lobe nodules nonspecific Patient has cerebral palsy and her sister is the power of attorney for healthcare Treatment plan: Palliative treatment with Taxol  x12 cycles completed 08/26/2018, Kingenti, Perjeta  ------------------------------------------------------------------------------------------------------------------------ PET CT scan: 10/27/2018: Widespread bone metastatic disease demonstrates reduced with sclerosis and low metabolic activity with SUV of 3-4.  Left level 4 lymph node 0.5 cm and left deep parotid lymph node SUV 4.1 will need follow-up.  Uterine leiomyomas. PET/CT 11/11/2019: Continued mild hypermetabolic activity left supraclavicular lymph node 0.7 cm, old sclerotic bone lesions are stable and not hypermetabolic.  Uterine fibroids   CT CAP 05/27/2022: Similarly treated bone metastases.  Pulmonary nodules felt to be similar CT CAP 12/23/2022: Stable findings without any new or progressive disease.  Stable treated bone metastases.  Stable lung nodules favored to be benign.  Uterine fibroid CT CAP 07/17/2023: No interval change   Current treatment: Herceptin  (biosimilar) maintenance along with Tamoxifen    Tamoxifen  toxicities: Denies any hot flashes or myalgias. Herceptin  toxicities: None Echocardiograms every 6 months.  She follows with Dr. Ladona with cardiology.   Continue Herceptin   and Tamoxifen  as scheduled, patient is tolerating well.  CT CAP 12/31/2023: Stable RUL nodules, treated bone mets

## 2024-02-18 NOTE — Progress Notes (Signed)
 Patient Care Team: Jolinda Norene HERO, DO as PCP - General (Family Medicine) Alvan Ronal BRAVO, MD (Inactive) as PCP - Cardiology (Cardiology) Odean Potts, MD as Consulting Physician (Hematology and Oncology) [facility]  DIAGNOSIS:  Encounter Diagnosis  Name Primary?   Malignant neoplasm of upper-outer quadrant of right breast in female, estrogen receptor positive (HCC) Yes    SUMMARY OF ONCOLOGIC HISTORY: Oncology History  Malignant neoplasm of upper-outer quadrant of right breast in female, estrogen receptor positive (HCC)  03/18/2018 Initial Diagnosis   Screening detected bilateral breast abnormalities.  Right breast mass UOQ 2.1 cm at 10 o'clock position: Grade 1 ILC ER 70%, PR 0%, HER-2 +3+ by IHC, Ki-67 5%, suspected lymph node in the axilla could not be biopsied; left breast calcifications by ultrasound not visible but 5 cm cystic/tubular structure which on biopsy was fibrocystic change with ADHl; T2N0 stage IIa clinical stage   05/01/2018 Surgery   Bilateral mastectomies: Left mastectomy: DCIS intermediate grade 0.9 cm margins negative Tis NX stage 0; right mastectomy: ILC, grade 3, 3.5 cm, with LCIS, perineural invasion present, margins negative, 15/22 lymph nodes positive with extracapsular extension, ER 90%, PR 0%, HER-2 +(IHC 3+), Ki-67 5%, T2N3 Stage 3B    05/11/2018 Cancer Staging   Staging form: Breast, AJCC 8th Edition - Pathologic stage from 05/11/2018: Stage IIIB (pT2, pN3, cM0, G3, ER+, PR-, HER2+) - Signed by Odean Potts, MD on 05/11/2018   05/2018 -  Anti-estrogen oral therapy   Tamoxifen  daily   06/03/2018 Imaging   Bone scan: Increased tracer uptake in the lumbar spine at L1, L3 and L4, right fourth and seventh ribs, inferior right scapula and distal sternum, T7 and T11 as well.   CT CAP showed widespread bone metastases with possible pathologic fracture at L3, peripheral right upper lobe nodules nonspecific   06/10/2018 -  Chemotherapy    Taxol  Kingenti and Perjeta     10/27/2018 PET scan   Bone metastases less metabolically active.  Couple of lymph nodes with low-level metabolic activity noted.   07/10/2022 - 07/10/2022 Chemotherapy   Patient is on Treatment Plan : BREAST Trastuzumab  IV (8/6) or SQ (600) D1 q21d     08/21/2022 -  Chemotherapy   Patient is on Treatment Plan : BREAST MAINTENANCE Trastuzumab  IV (6) or SQ (600) D1 q42d x 13 cycles     Malignant neoplasm metastatic to bone (HCC)  06/04/2018 Initial Diagnosis   Malignant neoplasm metastatic to bone (HCC)   07/10/2022 - 07/10/2022 Chemotherapy   Patient is on Treatment Plan : BREAST Trastuzumab  IV (8/6) or SQ (600) D1 q21d       CHIEF COMPLIANT: Follow-up on Herceptin  injection  HISTORY OF PRESENT ILLNESS:   History of Present Illness [name] is a 58 year old female with a history of cancer and fibroid who presents with pelvic pain.  She is here for her Herceptin  treatment.  She experiences pelvic pain without any associated spotting or bleeding. There is a known fibroid and a cyst on her right ovary. The pain has been present for some time, and she sometimes uses Tylenol  or Advil  for relief, although she is often reluctant to take medication.     ALLERGIES:  is allergic to hydrocodone .  MEDICATIONS:  Current Outpatient Medications  Medication Sig Dispense Refill   Accu-Chek Softclix Lancets lancets Check BGS up to twice daily Dx E11.9 200 each 3   carvedilol  (COREG ) 3.125 MG tablet Take 1 tablet (3.125 mg total) by  mouth 2 (two) times daily. 180 tablet 3   dapagliflozin  propanediol (FARXIGA ) 10 MG TABS tablet Take 1 tablet (10 mg total) by mouth daily before breakfast. 90 tablet 3   glucose blood (ACCU-CHEK GUIDE TEST) test strip Check BGS up to twice daily Dx E11.9 200 strip 3   Lancets Misc. MISC Check BGs up to twice daily. E11.9 May substitute to any manufacturer covered by patient's insurance. 100 each PRN   losartan  (COZAAR ) 50 MG  tablet Take 1 tablet (50 mg total) by mouth daily. 90 tablet 3   megestrol  (MEGACE ) 40 MG tablet TAKE 1 TABLET (40 MG TOTAL) BY MOUTH 2 (TWO) TIMES DAILY. TAKE 3 TABLETS EVERY DAY 270 tablet 2   Multiple Vitamin (MULTI-VITAMIN) tablet Take 1 tablet by mouth daily.     pantoprazole  (PROTONIX ) 40 MG tablet TAKE 1 TABLET BY MOUTH 30 MINUTES PRIOR TO BREAKFAST AND SUPPER, 180 tablet 3   potassium chloride  (KLOR-CON ) 10 MEQ tablet TAKE 1 TABLET BY MOUTH EVERY DAY 90 tablet 1   pravastatin  (PRAVACHOL ) 40 MG tablet Take 1 tablet (40 mg total) by mouth daily. 90 tablet 3   sitaGLIPtin  (JANUVIA ) 50 MG tablet Take 1 tablet (50 mg total) by mouth daily. Take with Farxiga  daily for sugar. 90 tablet 3   tamoxifen  (NOLVADEX ) 20 MG tablet TAKE 1 TABLET BY MOUTH EVERY DAY 90 tablet 3   Vitamin D , Ergocalciferol , (DRISDOL ) 1.25 MG (50000 UNIT) CAPS capsule Take 1 capsule (50,000 Units total) by mouth every 7 (seven) days. 12 capsule 3   No current facility-administered medications for this visit.    PHYSICAL EXAMINATION: ECOG PERFORMANCE STATUS: 1 - Symptomatic but completely ambulatory  Vitals:   02/18/24 0916  BP: 118/80  Pulse: 91  Resp: 18  Temp: 98 F (36.7 C)  SpO2: 99%   Filed Weights   02/18/24 0916  Weight: 122 lb 11.2 oz (55.7 kg)      LABORATORY DATA:  I have reviewed the data as listed    Latest Ref Rng & Units 01/21/2024   11:56 AM 12/31/2023   12:12 PM 12/31/2023   12:01 PM  CMP  Glucose 70 - 99 mg/dL 892     BUN 8 - 27 mg/dL 13     Creatinine 9.42 - 1.00 mg/dL 9.37  9.49  <9.79   Sodium 134 - 144 mmol/L 142     Potassium 3.5 - 5.2 mmol/L 3.6     Chloride 96 - 106 mmol/L 108     CO2 20 - 29 mmol/L 18     Calcium 8.7 - 10.3 mg/dL 8.8     Total Protein 6.0 - 8.5 g/dL 6.9     Total Bilirubin 0.0 - 1.2 mg/dL 0.3     Alkaline Phos 44 - 121 IU/L 70     AST 0 - 40 IU/L 14     ALT 0 - 32 IU/L 10       Lab Results  Component Value Date   WBC 8.1 01/21/2024   HGB 12.7  01/21/2024   HCT 40.5 01/21/2024   MCV 94 01/21/2024   PLT 292 01/21/2024   NEUTROABS 5.1 01/21/2024    ASSESSMENT & PLAN:  Malignant neoplasm of upper-outer quadrant of right breast in female, estrogen receptor positive (HCC) 05/01/18: Bilateral mastectomies: Left mastectomy: 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   CT CAP showed widespread bone metastases with possible pathologic fracture at L3, peripheral right upper lobe nodules nonspecific Patient has cerebral palsy and her sister is the power of attorney for healthcare Treatment plan: Palliative treatment with Taxol  x12 cycles completed 08/26/2018, Kingenti, Perjeta  ------------------------------------------------------------------------------------------------------------------------ PET CT scan: 10/27/2018: Widespread bone metastatic disease demonstrates reduced with sclerosis and low metabolic activity with SUV of 3-4.  Left level 4 lymph node 0.5 cm and left deep parotid lymph node SUV 4.1 will need follow-up.  Uterine leiomyomas. PET/CT 11/11/2019: Continued mild hypermetabolic activity left supraclavicular lymph node 0.7 cm, old sclerotic bone lesions are stable and not hypermetabolic.  Uterine fibroids   CT CAP 05/27/2022: Similarly treated bone metastases.  Pulmonary nodules felt to be similar CT CAP 12/23/2022: Stable findings without any new or progressive disease.  Stable treated bone metastases.  Stable lung nodules favored to be benign.  Uterine fibroid CT CAP 07/17/2023: No interval change   Current treatment: Herceptin   (biosimilar) maintenance along with Tamoxifen    Tamoxifen  toxicities: Denies any hot flashes or myalgias. Herceptin  toxicities: None Echocardiograms every 6 months.  She follows with Dr. Ladona with cardiology.    Pelvic pain: Patient plans to see GYN for that.  Could be related to fibroids or the ovarian cyst. Continue Herceptin  and Tamoxifen  as scheduled, patient is tolerating well.  CT CAP 12/31/2023: Stable RUL nodules, treated bone mets ------------------------------------- Assessment and Plan Assessment & Plan Malignant neoplasm of upper-outer quadrant of right breast, ER+ with bone metastases  Right ovarian cyst Persistent pain likely due to cyst, no acute rupture suspected. - Use acetaminophen  or ibuprofen  for pain management as needed. - Schedule appointment with OB/GYN for further evaluation.  Uterine fibroid Pain associated with fibroid, no spotting or bleeding. - Schedule appointment with OB/GYN for further evaluation.      No orders of the defined types were placed in this encounter.  The patient has a good understanding of the overall plan. she agrees with it. she will call with any problems that may develop before the next visit here.  I personally spent a total of 30 minutes in the care of the patient today including preparing to see the patient, getting/reviewing separately obtained history, performing a medically appropriate exam/evaluation, counseling and educating, placing orders, referring and communicating with other health care professionals, documenting clinical information in the EHR, independently interpreting results, communicating results, and coordinating care.   Viinay K Rylei Masella, MD 02/18/24

## 2024-02-24 ENCOUNTER — Ambulatory Visit

## 2024-02-24 ENCOUNTER — Encounter: Payer: Self-pay | Admitting: Family Medicine

## 2024-02-24 DIAGNOSIS — H5213 Myopia, bilateral: Secondary | ICD-10-CM | POA: Diagnosis not present

## 2024-02-24 DIAGNOSIS — E119 Type 2 diabetes mellitus without complications: Secondary | ICD-10-CM | POA: Diagnosis not present

## 2024-02-24 LAB — OPHTHALMOLOGY REPORT-SCANNED

## 2024-03-01 ENCOUNTER — Ambulatory Visit (INDEPENDENT_AMBULATORY_CARE_PROVIDER_SITE_OTHER)

## 2024-03-01 VITALS — BP 118/80 | HR 91 | Ht 61.0 in | Wt 122.0 lb

## 2024-03-01 DIAGNOSIS — Z0000 Encounter for general adult medical examination without abnormal findings: Secondary | ICD-10-CM

## 2024-03-01 NOTE — Progress Notes (Signed)
 Subjective:   [name] is a 58 y.o. who presents for a Medicare Wellness preventive visit.  As a reminder, Annual Wellness Visits don't include a physical exam, and some assessments may be limited, especially if this visit is performed virtually. We may recommend an in-person follow-up visit with your provider if needed.  Visit Complete: Virtual I connected with  [name] on 03/01/24 by a audio enabled telemedicine application and verified that I am speaking with the correct person using two identifiers.  Patient Location: Home  Provider Location: Office/Clinic  I discussed the limitations of evaluation and management by telemedicine. The patient expressed understanding and agreed to proceed.  Vital Signs: Because this visit was a virtual/telehealth visit, some criteria may be missing or patient reported. Any vitals not documented were not able to be obtained and vitals that have been documented are patient reported.  VideoDeclined- This patient declined Librarian, academic. Therefore the visit was completed with audio only.  Persons Participating in Visit: Patient assisted by Eladio Mulch, pt's sister.  AWV Questionnaire: No: Patient Medicare AWV questionnaire was not completed prior to this visit.  Cardiac Risk Factors include: advanced age (>54men, >87 women)     Objective:    There were no vitals filed for this visit. There is no height or weight on file to calculate BMI.     03/01/2024   12:53 PM 02/18/2024    9:15 AM 01/07/2024   11:51 AM 12/25/2022    8:29 AM 12/04/2022   10:09 AM 11/06/2022   10:00 AM 07/10/2022    8:39 AM  Advanced Directives  Does Patient Have a Medical Advance Directive? No No No No No No No  Does patient want to make changes to medical advance directive?  No - Patient declined No - Patient declined No - Patient declined   No - Patient declined  Would patient like information on creating a medical advance directive?  No - Patient declined No - Patient declined  No - Patient declined No - Patient declined No - Patient declined No - Patient declined    Current Medications (verified) Outpatient Encounter Medications as of 03/01/2024  Medication Sig   Accu-Chek Softclix Lancets lancets Check BGS up to twice daily Dx E11.9   carvedilol  (COREG ) 3.125 MG tablet Take 1 tablet (3.125 mg total) by mouth 2 (two) times daily.   dapagliflozin  propanediol (FARXIGA ) 10 MG TABS tablet Take 1 tablet (10 mg total) by mouth daily before breakfast.   glucose blood (ACCU-CHEK GUIDE TEST) test strip Check BGS up to twice daily Dx E11.9   Lancets Misc. MISC Check BGs up to twice daily. E11.9 May substitute to any manufacturer covered by patient's insurance.   losartan  (COZAAR ) 50 MG tablet Take 1 tablet (50 mg total) by mouth daily.   megestrol  (MEGACE ) 40 MG tablet TAKE 1 TABLET (40 MG TOTAL) BY MOUTH 2 (TWO) TIMES DAILY. TAKE 3 TABLETS EVERY DAY   Multiple Vitamin (MULTI-VITAMIN) tablet Take 1 tablet by mouth daily.   pantoprazole  (PROTONIX ) 40 MG tablet TAKE 1 TABLET BY MOUTH 30 MINUTES PRIOR TO BREAKFAST AND SUPPER,   potassium chloride  (KLOR-CON ) 10 MEQ tablet TAKE 1 TABLET BY MOUTH EVERY DAY   pravastatin  (PRAVACHOL ) 40 MG tablet Take 1 tablet (40 mg total) by mouth daily.   sitaGLIPtin  (JANUVIA ) 50 MG tablet Take 1 tablet (50 mg total) by mouth daily. Take with Farxiga  daily for sugar.   tamoxifen  (NOLVADEX ) 20 MG tablet TAKE 1  TABLET BY MOUTH EVERY DAY   Vitamin D , Ergocalciferol , (DRISDOL ) 1.25 MG (50000 UNIT) CAPS capsule Take 1 capsule (50,000 Units total) by mouth every 7 (seven) days.   [DISCONTINUED] prochlorperazine  (COMPAZINE ) 10 MG tablet Take 1 tablet (10 mg total) by mouth every 6 (six) hours as needed (Nausea or vomiting).   No facility-administered encounter medications on file as of 03/01/2024.    Allergies (verified) Hydrocodone    History: Past Medical History:  Diagnosis Date   Arthritis     Cerebral palsy (HCC)    Complication of anesthesia 12/09/2013   Respiratory instability with 10 mg Propofol .Poor gag reflex.   Constipation - functional    Dysphagia 2003   2O to GERD/HH, ?difficulty with large food bolus due to neuromuscular weakness   Epigastric pain MAY 2012 ABD US  NL GBLIVPAN   2o to GERD V. 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    Past Surgical History:  Procedure Laterality Date   BREAST SURGERY  04/2019   Breast Cancer   COLONOSCOPY  APR 2009 with proprofol   SLF: SIMPLE ADENOMAS   MASTECTOMY W/ SENTINEL NODE BIOPSY Bilateral 05/01/2018   Procedure: BILATERAL MASTECTOMIES WITH LEFT SENTINEL LYMPH NODE BIOPSIES AND RIGHT AXILLARY LYMPH NODE DISSECTION;  Surgeon: Ethyl Lenis, MD;  Location: [facility] OR;  Service: General;  Laterality: Bilateral;   PORTACATH PLACEMENT N/A 05/01/2018   Procedure: INSERTION PORT-A-CATH WITH US ;  Surgeon: Ethyl Lenis, MD;  Location: [facility] OR;  Service: General;  Laterality: N/A;   UPPER GASTROINTESTINAL ENDOSCOPY  MAY 2012 DIL 16 mm   SLF: NO DEFINITE STRICTURE APPRECIATED   UPPER GASTROINTESTINAL ENDOSCOPY  FEB 2009-HYPOXIA REQUIRING NARCAN, & VERSED , D50V4   SLF: NL ESO, FUNDIC GLAND POLYPS   UPPER GASTROINTESTINAL ENDOSCOPY  w/ DIL 2003, 2004, 2006   Family History  Problem Relation Age of Onset   Hypertension Mother    Diabetes Mother    Colon cancer Mother        LATE 40S   Cancer Mother    Stroke Mother    Hypertension Brother    Diabetes Brother    Drug abuse Brother    Diabetes Maternal Grandmother    Diabetes Maternal Grandfather    Hypertension Maternal Grandfather    Heart disease Maternal Grandfather    Stroke Maternal Grandfather    Cancer Paternal Grandmother    Kidney disease Sister    Kidney disease  Maternal Aunt    Kidney disease Maternal Uncle    Social History   Socioeconomic History   Marital status: Single    Spouse name: Not on file   Number of children: Not on file   Years of education: Not on file   Highest education level: 12th grade  Occupational History   Not on file  Tobacco Use   Smoking status: Never   Smokeless tobacco: Never  Substance and Sexual Activity   Alcohol use: No   Drug use: Yes    Types: PCP   Sexual activity: Not Currently    Birth control/protection: None  Other Topics Concern   Not on file  Social History Narrative   Not on file   Social Drivers of Health   Financial Resource Strain: Low Risk  (03/01/2024)  Overall Financial Resource Strain (CARDIA)    Difficulty of Paying Living Expenses: Not very hard  Food Insecurity: No Food Insecurity (03/01/2024)   Hunger Vital Sign    Worried About Running Out of Food in the Last Year: Never true    Ran Out of Food in the Last Year: Never true  Transportation Needs: No Transportation Needs (03/01/2024)   PRAPARE - Administrator, Civil Service (Medical): No    Lack of Transportation (Non-Medical): No  Physical Activity: Inactive (03/01/2024)   Exercise Vital Sign    Days of Exercise per Week: 0 days    Minutes of Exercise per Session: 0 min  Stress: No Stress Concern Present (03/01/2024)   [employer] of Occupational Health - Occupational Stress Questionnaire    Feeling of Stress: Only a little  Social Connections: Moderately Integrated (03/01/2024)   Social Connection and Isolation Panel    Frequency of Communication with Friends and Family: More than three times a week    Frequency of Social Gatherings with Friends and Family: Three times a week    Attends Religious Services: More than 4 times per year    Active Member of Clubs or Organizations: Yes    Attends Engineer, structural: More than 4 times per year    Marital Status: Never married    Tobacco  Counseling Counseling given: Yes    Clinical Intake:  Pre-visit preparation completed: Yes  Pain : No/denies pain     Nutritional Risks: None Diabetes: Yes  Lab Results  Component Value Date   HGBA1C 6.8 (H) 01/21/2024   HGBA1C 6.8 (H) 06/24/2023   HGBA1C 6.6 (H) 02/10/2023     How often do you need to have someone help you when you read instructions, pamphlets, or other written materials from your doctor or pharmacy?: 5 - Always (pt's sister/in house aid)  Interpreter Needed?: No  Information entered by :: alia t/cma   Activities of Daily Living     03/01/2024   12:50 PM  In your present state of health, do you have any difficulty performing the following activities:  Hearing? 0  Vision? 0  Difficulty concentrating or making decisions? 0  Walking or climbing stairs? 1  Dressing or bathing? 0  Doing errands, shopping? 1  Comment pt's sister  Preparing Food and eating ? N  Comment pt's sister/in house aid  Using the Toilet? N  Comment pt's sister/in house aid  In the past six months, have you accidently leaked urine? Y  Do you have problems with loss of bowel control? N  Managing your Medications? Y  Comment pt's sister/in house aid  Managing your Finances? Y  Comment pt's sister/in house aid  Housekeeping or managing your Housekeeping? Y  Comment pt's sister/in house aid    Patient Care Team: Jolinda Norene HERO, DO as PCP - General (Family Medicine) Alvan Ronal BRAVO, MD (Inactive) as PCP - Cardiology (Cardiology) Odean Potts, MD as Consulting Physician (Hematology and Oncology) [facility]  I have updated your Care Teams any recent Medical Services you may have received from other providers in the past year.     Assessment:   This is a routine wellness examination for Naudia.  Hearing/Vision screen Hearing Screening - Comments:: Pt denies hearing dif Vision Screening - Comments:: Pt wear glasses/pt goes to [facility]  Ophthalmology/last 02/24/24   Goals Addressed             This Visit's Progress  DIET - INCREASE WATER INTAKE   On track      Depression Screen     03/01/2024   12:53 PM 01/21/2024   11:24 AM 06/24/2023    9:19 AM 11/06/2022    9:59 AM 02/09/2018    8:06 AM 12/25/2017    5:10 PM 10/07/2017    8:10 AM  PHQ 2/9 Scores  PHQ - 2 Score 0 0 0 1 0 0 0  PHQ- 9 Score 0 0 0        Fall Risk     03/01/2024   12:47 PM 01/21/2024   11:24 AM 06/24/2023    9:19 AM 02/10/2023    8:45 AM 11/06/2022    9:57 AM  Fall Risk   Falls in the past year? 0 Exclusion - non ambulatory 0 0 0  Number falls in past yr: 0  0 0 0  Injury with Fall? 0  0 0 0  Risk for fall due to : No Fall Risks  No Fall Risks No Fall Risks No Fall Risks  Follow up Falls evaluation completed  Education provided Education provided Falls prevention discussed    MEDICARE RISK AT HOME:  Medicare Risk at Home Any stairs in or around the home?: Yes If so, are there any without handrails?: Yes Home free of loose throw rugs in walkways, pet beds, electrical cords, etc?: Yes Adequate lighting in your home to reduce risk of falls?: Yes Life alert?: No Use of a cane, walker or w/c?: Yes Grab bars in the bathroom?: No Shower chair or bench in shower?: Yes Elevated toilet seat or a handicapped toilet?: No  TIMED UP AND GO:  Was the test performed?  no  Cognitive Function: 6CIT completed        03/01/2024   12:59 PM 11/06/2022   10:00 AM  6CIT Screen  What Year? 0 points 0 points  What month? 0 points 0 points  What time? 0 points 0 points  Count back from 20 0 points 0 points  Months in reverse 4 points 0 points  Repeat phrase 0 points 0 points  Total Score 4 points 0 points    Immunizations Immunization History  Administered Date(s) Administered   Influenza Inj Mdck Quad Pf 03/04/2017   Influenza, Seasonal, Injecte, Preservative Fre 02/10/2023, 01/21/2024   Influenza,inj,Quad PF,6+ Mos 04/12/2013,  02/06/2015, 02/01/2016, 02/03/2019, 02/10/2020, 02/21/2021, 03/06/2022   Moderna Sars-Covid-2 Vaccination 08/13/2019, 09/23/2019   PFIZER(Purple Top)SARS-COV-2 Vaccination 02/10/2020   PNEUMOCOCCAL CONJUGATE-20 01/21/2024   Tdap 09/03/2013, 01/21/2024    Screening Tests Health Maintenance  Topic Date Due   Zoster Vaccines- Shingrix (1 of 2) Never done   FOOT EXAM  10/09/2023   Medicare Annual Wellness (AWV)  11/06/2023   COVID-19 Vaccine (4 - 2025-26 season) 01/12/2024   Cervical Cancer Screening (HPV/Pap Cotest)  06/23/2024 (Originally 06/19/2022)   Diabetic kidney evaluation - Urine ACR  06/23/2024   HEMOGLOBIN A1C  07/20/2024   Colonoscopy  10/31/2024   Diabetic kidney evaluation - eGFR measurement  01/20/2025   OPHTHALMOLOGY EXAM  02/23/2025   DTaP/Tdap/Td (3 - Td or Tdap) 01/20/2034   Pneumococcal Vaccine: 50+ Years  Completed   Influenza Vaccine  Completed   Hepatitis C Screening  Completed   HIV Screening  Completed   Hepatitis B Vaccines 19-59 Average Risk  Aged Out   HPV VACCINES  Aged Out   Meningococcal B Vaccine  Aged Out   Mammogram  Discontinued    Health Maintenance Items Addressed: See  Nurse Notes at the end of this note  Additional Screening:  Vision Screening: Recommended annual ophthalmology exams for early detection of glaucoma and other disorders of the eye. Is the patient up to date with their annual eye exam?  Yes  Who is the provider or what is the name of the office in which the patient attends annual eye exams? [facility] in [place], [place]  Dental Screening: Recommended annual dental exams for proper oral hygiene  Community Resource Referral / Chronic Care Management: CRR required this visit?  No   CCM required this visit?  No   Plan:    I have personally reviewed and noted the following in the patient's chart:   Medical and social history Use of alcohol, tobacco or illicit drugs  Current medications and supplements  including opioid prescriptions. Patient is not currently taking opioid prescriptions. Functional ability and status Nutritional status Physical activity Advanced directives List of other physicians Hospitalizations, surgeries, and ER visits in previous 12 months Vitals Screenings to include cognitive, depression, and falls Referrals and appointments  In addition, I have reviewed and discussed with patient certain preventive protocols, quality metrics, and best practice recommendations. A written personalized care plan for preventive services as well as general preventive health recommendations were provided to patient.   Ozie Ned, CMA   03/01/2024   After Visit Summary: (MyChart) Due to this being a telephonic visit, the after visit summary with patients personalized plan was offered to patient via MyChart   Notes: PCP Follow Up Recommendations: pt is aware and due the following: foot exam--per pt pcp had already done it at last visit, covid/shingles vaccine

## 2024-03-01 NOTE — Patient Instructions (Signed)
 [name],  Thank you for taking the time for your Medicare Wellness Visit. I appreciate your continued commitment to your health goals. Please review the care plan we discussed, and feel free to reach out if I can assist you further.  Medicare recommends these wellness visits once per year to help you and your care team stay ahead of potential health issues. These visits are designed to focus on prevention, allowing your provider to concentrate on managing your acute and chronic conditions during your regular appointments.  Please note that Annual Wellness Visits do not include a physical exam. Some assessments may be limited, especially if the visit was conducted virtually. If needed, we may recommend a separate in-person follow-up with your provider.  Ongoing Care Seeing your primary care provider every 3 to 6 months helps us  monitor your health and provide consistent, personalized care.   Referrals If a referral was made during today's visit and you haven't received any updates within two weeks, please contact the referred provider directly to check on the status.  Recommended Screenings:  Health Maintenance  Topic Date Due   Zoster (Shingles) Vaccine (1 of 2) Never done   Complete foot exam   10/09/2023   Medicare Annual Wellness Visit  11/06/2023   COVID-19 Vaccine (4 - 2025-26 season) 01/12/2024   Pap with HPV screening  06/23/2024*   Yearly kidney health urinalysis for diabetes  06/23/2024   Hemoglobin A1C  07/20/2024   Colon Cancer Screening  10/31/2024   Yearly kidney function blood test for diabetes  01/20/2025   Eye exam for diabetics  02/23/2025   DTaP/Tdap/Td vaccine (3 - Td or Tdap) 01/20/2034   Pneumococcal Vaccine for age over 25  Completed   Flu Shot  Completed   Hepatitis C Screening  Completed   HIV Screening  Completed   Hepatitis B Vaccine  Aged Out   HPV Vaccine  Aged Out   Meningitis B Vaccine  Aged Out   Breast Cancer Screening  Discontinued  *Topic was  postponed. The date shown is not the original due date.       03/01/2024   12:53 PM  Advanced Directives  Does Patient Have a Medical Advance Directive? No  Would patient like information on creating a medical advance directive? No - Patient declined   Advance Care Planning is important because it: Ensures you receive medical care that aligns with your values, goals, and preferences. Provides guidance to your family and loved ones, reducing the emotional burden of decision-making during critical moments.  Vision: Annual vision screenings are recommended for early detection of glaucoma, cataracts, and diabetic retinopathy. These exams can also reveal signs of chronic conditions such as diabetes and high blood pressure.  Dental: Annual dental screenings help detect early signs of oral cancer, gum disease, and other conditions linked to overall health, including heart disease and diabetes.  Please see the attached documents for additional preventive care recommendations.

## 2024-03-10 ENCOUNTER — Other Ambulatory Visit: Payer: Self-pay

## 2024-03-26 ENCOUNTER — Ambulatory Visit (HOSPITAL_COMMUNITY)
Admission: RE | Admit: 2024-03-26 | Discharge: 2024-03-26 | Disposition: A | Discharge status: Home / Self Care | Source: Ambulatory Visit | Attending: Hematology and Oncology | Admitting: Hematology and Oncology

## 2024-03-26 DIAGNOSIS — Z79899 Other long term (current) drug therapy: Secondary | ICD-10-CM

## 2024-03-26 DIAGNOSIS — Z5181 Encounter for therapeutic drug level monitoring: Secondary | ICD-10-CM | POA: Diagnosis present

## 2024-03-26 DIAGNOSIS — Z7960 Long term (current) use of unspecified immunomodulators and immunosuppressants: Secondary | ICD-10-CM | POA: Insufficient documentation

## 2024-03-26 DIAGNOSIS — I119 Hypertensive heart disease without heart failure: Secondary | ICD-10-CM | POA: Insufficient documentation

## 2024-03-26 LAB — ECHOCARDIOGRAM COMPLETE
AR max vel: 2.54 cm2
AV Area VTI: 2.74 cm2
AV Area mean vel: 2.54 cm2
AV Mean grad: 3 mmHg
AV Peak grad: 5.7 mmHg
Ao pk vel: 1.19 m/s
Area-P 1/2: 2.11 cm2
S' Lateral: 2.1 cm

## 2024-03-31 ENCOUNTER — Ambulatory Visit: Admitting: Hematology and Oncology

## 2024-03-31 ENCOUNTER — Inpatient Hospital Stay: Attending: Hematology and Oncology

## 2024-03-31 VITALS — BP 155/95 | HR 91 | Temp 98.1°F | Resp 17

## 2024-03-31 DIAGNOSIS — Z5112 Encounter for antineoplastic immunotherapy: Secondary | ICD-10-CM | POA: Diagnosis present

## 2024-03-31 DIAGNOSIS — Z170 Estrogen receptor positive status [ER+]: Secondary | ICD-10-CM | POA: Diagnosis not present

## 2024-03-31 DIAGNOSIS — Z1731 Human epidermal growth factor receptor 2 positive status: Secondary | ICD-10-CM | POA: Insufficient documentation

## 2024-03-31 DIAGNOSIS — Z79811 Long term (current) use of aromatase inhibitors: Secondary | ICD-10-CM | POA: Diagnosis not present

## 2024-03-31 DIAGNOSIS — C7951 Secondary malignant neoplasm of bone: Secondary | ICD-10-CM | POA: Insufficient documentation

## 2024-03-31 DIAGNOSIS — C50411 Malignant neoplasm of upper-outer quadrant of right female breast: Secondary | ICD-10-CM | POA: Diagnosis not present

## 2024-03-31 DIAGNOSIS — Z1722 Progesterone receptor negative status: Secondary | ICD-10-CM | POA: Diagnosis not present

## 2024-03-31 MED ORDER — TRASTUZUMAB-ANNS CHEMO 150 MG IV SOLR
315.0000 mg | Freq: Once | INTRAVENOUS | Status: AC
  Administered 2024-03-31: 315 mg via INTRAVENOUS
  Filled 2024-03-31: qty 15

## 2024-03-31 MED ORDER — DIPHENHYDRAMINE HCL 25 MG PO CAPS
50.0000 mg | ORAL_CAPSULE | Freq: Once | ORAL | Status: AC
  Administered 2024-03-31: 50 mg via ORAL
  Filled 2024-03-31: qty 2

## 2024-03-31 MED ORDER — ACETAMINOPHEN 325 MG PO TABS
650.0000 mg | ORAL_TABLET | Freq: Once | ORAL | Status: AC
  Administered 2024-03-31: 650 mg via ORAL
  Filled 2024-03-31: qty 2

## 2024-03-31 MED ORDER — SODIUM CHLORIDE 0.9 % IV SOLN: Freq: Once | INTRAVENOUS | Status: AC

## 2024-03-31 NOTE — Patient Instructions (Signed)
 [facility] - A DEPT OF [facility]  Discharge Instructions: Thank you for choosing [facility] to provide your oncology and hematology care.   If you have a lab appointment with the Cancer Center, please go directly to the Cancer Center and check in at the registration area.   Wear comfortable clothing and clothing appropriate for easy access to any Portacath or PICC line.   We strive to give you quality time with your provider. You may need to reschedule your appointment if you arrive late (15 or more minutes).  Arriving late affects you and other patients whose appointments are after yours.  Also, if you miss three or more appointments without notifying the office, you may be dismissed from the clinic at the provider's discretion.      For prescription refill requests, have your pharmacy contact our office and allow 72 hours for refills to be completed.    Today you received the following chemotherapy and/or immunotherapy agents: Trastuzumab -anns (Kanjinti )    To help prevent nausea and vomiting after your treatment, we encourage you to take your nausea medication as directed.  BELOW ARE SYMPTOMS THAT SHOULD BE REPORTED IMMEDIATELY: *FEVER GREATER THAN 100.4 F (38 C) OR HIGHER *CHILLS OR SWEATING *NAUSEA AND VOMITING THAT IS NOT CONTROLLED WITH YOUR NAUSEA MEDICATION *UNUSUAL SHORTNESS OF BREATH *UNUSUAL BRUISING OR BLEEDING *URINARY PROBLEMS (pain or burning when urinating, or frequent urination) *BOWEL PROBLEMS (unusual diarrhea, constipation, pain near the anus) TENDERNESS IN MOUTH AND THROAT WITH OR WITHOUT PRESENCE OF ULCERS (sore throat, sores in mouth, or a toothache) UNUSUAL RASH, SWELLING OR PAIN  UNUSUAL VAGINAL DISCHARGE OR ITCHING   Items with * indicate a potential emergency and should be followed up as soon as possible or go to the Emergency Department if any problems should occur.  Please show the CHEMOTHERAPY ALERT CARD  or IMMUNOTHERAPY ALERT CARD at check-in to the Emergency Department and triage nurse.  Should you have questions after your visit or need to cancel or reschedule your appointment, please contact [facility] - A DEPT OF [facility]  Dept: [phone]  and follow the prompts.  Office hours are 8:00 a.m. to 4:30 p.m. Monday - Friday. Please note that voicemails left after 4:00 p.m. may not be returned until the following business day.  We are closed weekends and major holidays. You have access to a nurse at all times for urgent questions. Please call the main number to the clinic Dept: [phone] and follow the prompts.   For any non-urgent questions, you may also contact your provider using MyChart. We now offer e-Visits for anyone 57 and older to request care online for non-urgent symptoms. For details visit [email or web address].   Also download the MyChart app! Go to the app store, search MyChart, open the app, select [facility], and log in with your MyChart username and password.

## 2024-04-23 ENCOUNTER — Ambulatory Visit
Admission: EM | Admit: 2024-04-23 | Discharge: 2024-04-23 | Disposition: A | Discharge status: Home / Self Care | Attending: Family Medicine | Admitting: Family Medicine

## 2024-04-23 DIAGNOSIS — K148 Other diseases of tongue: Secondary | ICD-10-CM | POA: Diagnosis not present

## 2024-04-23 MED ORDER — NYSTATIN 100000 UNIT/ML MT SUSP: 500000.0000 [IU] | Freq: Four times a day (QID) | OROMUCOSAL | 0 refills | Status: AC

## 2024-04-23 MED ORDER — LIDOCAINE VISCOUS HCL 2 % MT SOLN: 10.0000 mL | OROMUCOSAL | 0 refills | Status: AC | PRN

## 2024-04-23 NOTE — ED Triage Notes (Signed)
 Per sister, pts tongue is hurting on the right side x 1 week  Pt states she cannot eat or drink due to tongue pain

## 2024-04-23 NOTE — ED Provider Notes (Signed)
 [facility]    CSN: [id number] Arrival date & time: 04/23/24  1010      History   Chief Complaint No chief complaint on file.   HPI [name] is a 58 y.o. female.   Patient presenting today with about a week of right sided tongue pain and now a noticeable lesion to the right side of the tongue.  Denies difficulty breathing or swallowing, fevers, chills, new foods or medications.  So far not trying anything over-the-counter for symptoms.    Past Medical History:  Diagnosis Date   Arthritis    Cerebral palsy (HCC)    Complication of anesthesia 12/09/2013   Respiratory instability with 10 mg Propofol .Poor gag reflex.   Constipation - functional    Dysphagia 2003   2O to GERD/HH, ?difficulty with large food bolus due to neuromuscular weakness   Epigastric pain MAY 2012 ABD US  NL GBLIVPAN   2o to GERD V. 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     Patient Active Problem List   Diagnosis Date Noted   Diabetes mellitus treated with oral medication (HCC) 01/21/2024   Microalbuminuric diabetic nephropathy (HCC) 01/21/2024   Goals of care, counseling/discussion 09/14/2018   Port-A-Cath in place 06/17/2018   Malignant neoplasm metastatic to bone (HCC) 06/04/2018   Breast cancer, stage 2, right (HCC) 05/01/2018   Malignant neoplasm of upper-outer quadrant of right breast in female, estrogen receptor positive (HCC) 03/23/2018   Frequency of micturition 09/03/2013   Pain in limb 04/12/2013   Wart viral 04/12/2013   Hyperlipidemia associated with type 2 diabetes mellitus (HCC) 09/16/2012   Vitamin D  deficiency 09/16/2012   Hypertension associated with diabetes (HCC) 09/16/2012   ADENOMATOUS COLONIC POLYP 11/21/2009   Disorder of teeth and supporting  structures 07/11/2009   Infantile cerebral palsy (HCC) 11/23/2008   GERD 11/23/2008   Constipation 11/23/2008   Dysphagia 11/23/2008    Past Surgical History:  Procedure Laterality Date   BREAST SURGERY  04/2019   Breast Cancer   COLONOSCOPY  APR 2009 with proprofol   SLF: SIMPLE ADENOMAS   MASTECTOMY W/ SENTINEL NODE BIOPSY Bilateral 05/01/2018   Procedure: BILATERAL MASTECTOMIES WITH LEFT SENTINEL LYMPH NODE BIOPSIES AND RIGHT AXILLARY LYMPH NODE DISSECTION;  Surgeon: Ethyl Lenis, MD;  Location: [facility] OR;  Service: General;  Laterality: Bilateral;   PORTACATH PLACEMENT N/A 05/01/2018   Procedure: INSERTION PORT-A-CATH WITH US ;  Surgeon: Ethyl Lenis, MD;  Location: [facility] OR;  Service: General;  Laterality: N/A;   UPPER GASTROINTESTINAL ENDOSCOPY  MAY 2012 DIL 16 mm   SLF: NO DEFINITE STRICTURE APPRECIATED   UPPER GASTROINTESTINAL ENDOSCOPY  FEB 2009-HYPOXIA REQUIRING NARCAN, & VERSED , D50V4   SLF: NL ESO, FUNDIC GLAND POLYPS   UPPER GASTROINTESTINAL ENDOSCOPY  w/ DIL 2003, 2004, 2006    OB History   No obstetric history on file.      Home Medications    Prior to Admission medications  Medication Sig Start Date End Date Taking? Authorizing Provider  lidocaine  (XYLOCAINE ) 2 % solution Use as directed 10 mLs in the mouth or throat every 3 (three) hours as needed. 04/23/24  Yes Stuart Vernell Norris, PA-C  nystatin  (MYCOSTATIN ) 100000 UNIT/ML suspension Take 5 mLs (500,000 Units total) by  mouth 4 (four) times daily. 04/23/24  Yes Stuart Vernell Norris, PA-C  Accu-Chek Softclix Lancets lancets Check BGS up to twice daily Dx E11.9 12/03/23   Jolinda Potter M, DO  carvedilol  (COREG ) 3.125 MG tablet Take 1 tablet (3.125 mg total) by mouth 2 (two) times daily. 01/21/24   Jolinda Potter HERO, DO  dapagliflozin  propanediol (FARXIGA ) 10 MG TABS tablet Take 1 tablet (10 mg total) by mouth daily before breakfast. 01/21/24   Jolinda Potter M, DO  glucose blood (ACCU-CHEK GUIDE TEST) test  strip Check BGS up to twice daily Dx E11.9 12/03/23   Jolinda Potter HERO, DO  Lancets Misc. MISC Check BGs up to twice daily. E11.9 May substitute to any manufacturer covered by patient's insurance. 10/09/22   Jolinda Potter HERO, DO  losartan  (COZAAR ) 50 MG tablet Take 1 tablet (50 mg total) by mouth daily. 01/21/24   Jolinda Potter HERO, DO  megestrol  (MEGACE ) 40 MG tablet TAKE 1 TABLET (40 MG TOTAL) BY MOUTH 2 (TWO) TIMES DAILY. TAKE 3 TABLETS EVERY DAY 06/03/23   Gudena, Vinay, MD  Multiple Vitamin (MULTI-VITAMIN) tablet Take 1 tablet by mouth daily.    [provider]  pantoprazole  (PROTONIX ) 40 MG tablet TAKE 1 TABLET BY MOUTH 30 MINUTES PRIOR TO BREAKFAST AND SUPPER, 11/20/23   Gudena, Vinay, MD  potassium chloride  (KLOR-CON ) 10 MEQ tablet TAKE 1 TABLET BY MOUTH EVERY DAY 02/17/24   Gudena, Vinay, MD  pravastatin  (PRAVACHOL ) 40 MG tablet Take 1 tablet (40 mg total) by mouth daily. 01/21/24   Jolinda Potter HERO, DO  sitaGLIPtin  (JANUVIA ) 50 MG tablet Take 1 tablet (50 mg total) by mouth daily. Take with Farxiga  daily for sugar. 01/21/24   Jolinda Potter HERO, DO  tamoxifen  (NOLVADEX ) 20 MG tablet TAKE 1 TABLET BY MOUTH EVERY DAY 05/29/23   Odean Potts, MD  Vitamin D , Ergocalciferol , (DRISDOL ) 1.25 MG (50000 UNIT) CAPS capsule Take 1 capsule (50,000 Units total) by mouth every 7 (seven) days. 01/26/24   Jolinda Potter HERO, DO  prochlorperazine  (COMPAZINE ) 10 MG tablet Take 1 tablet (10 mg total) by mouth every 6 (six) hours as needed (Nausea or vomiting). 05/11/18 06/04/18  Odean Potts, MD    Family History Family History  Problem Relation Age of Onset   Hypertension Mother    Diabetes Mother    Colon cancer Mother        LATE 63S   Cancer Mother    Stroke Mother    Hypertension Brother    Diabetes Brother    Drug abuse Brother    Diabetes Maternal Grandmother    Diabetes Maternal Grandfather    Hypertension Maternal Grandfather    Heart disease Maternal Grandfather    Stroke  Maternal Grandfather    Cancer Paternal Grandmother    Kidney disease Sister    Kidney disease Maternal Aunt    Kidney disease Maternal Uncle     Social History Social History[1]   Allergies   Hydrocodone    Review of Systems Review of Systems Per HPI  Physical Exam Triage Vital Signs ED Triage Vitals [04/23/24 1029]  Encounter Vitals Group     BP (!) 160/90     Girls Systolic BP Percentile      Girls Diastolic BP Percentile      Boys Systolic BP Percentile      Boys Diastolic BP Percentile      Pulse Rate 83     Resp 18     Temp 99.1 F (37.3 C)  Temp Source Oral     SpO2 95 %     Weight      Height      Head Circumference      Peak Flow      Pain Score 8     Pain Loc      Pain Education      Exclude from Growth Chart    No data found.  Updated Vital Signs BP (!) 160/90 (BP Location: Left Arm)   Pulse 83   Temp 99.1 F (37.3 C) (Oral)   Resp 18   SpO2 95%   Visual Acuity Right Eye Distance:   Left Eye Distance:   Bilateral Distance:    Right Eye Near:   Left Eye Near:    Bilateral Near:     Physical Exam Vitals and nursing note reviewed.  Constitutional:      Appearance: Normal appearance. She is not ill-appearing.  HENT:     Head: Atraumatic.     Mouth/Throat:     Mouth: Mucous membranes are moist.     Comments: Slight white coating across tongue, 1 cm circular erosion to the right lateral tongue Eyes:     Extraocular Movements: Extraocular movements intact.     Conjunctiva/sclera: Conjunctivae normal.  Neck:     Comments: Range of motion at baseline Cardiovascular:     Rate and Rhythm: Normal rate.  Pulmonary:     Effort: Pulmonary effort is normal.  Musculoskeletal:     Comments: Range of motion at baseline  Skin:    General: Skin is warm and dry.  Neurological:     Mental Status: She is alert. Mental status is at baseline.  Psychiatric:        Mood and Affect: Mood normal.        Thought Content: Thought content normal.         Judgment: Judgment normal.      UC Treatments / Results  Labs (all labs ordered are listed, but only abnormal results are displayed) Labs Reviewed - No data to display  EKG   Radiology No results found.  Procedures Procedures (including critical care time)  Medications Ordered in UC Medications - No data to display  Initial Impression / Assessment and Plan / UC Course  I have reviewed the triage vital signs and the nursing notes.  Pertinent labs & imaging results that were available during my care of the patient were reviewed by me and considered in my medical decision making (see chart for details).     Treat with nystatin  rinse in case yeast related irritation, viscous lidocaine  for pain relief throughout the day as needed, close dental follow-up if not resolving.  Final Clinical Impressions(s) / UC Diagnoses   Final diagnoses:  Tongue lesion     Discharge Instructions      Trial the statin mouth rinse to see if this helps resolve the tongue pain.  You may apply the lidocaine  solution to a cotton ball and set it on the lesion as needed for pain relief.  Follow-up with dentist if not resolving    ED Prescriptions     Medication Sig Dispense Auth. Provider   nystatin  (MYCOSTATIN ) 100000 UNIT/ML suspension Take 5 mLs (500,000 Units total) by mouth 4 (four) times daily. 60 mL Stuart Vernell Norris, PA-C   lidocaine  (XYLOCAINE ) 2 % solution Use as directed 10 mLs in the mouth or throat every 3 (three) hours as needed. 100 mL Stuart Vernell Norris, PA-C  PDMP not reviewed this encounter.    [1]  Social History Tobacco Use   Smoking status: Never   Smokeless tobacco: Never  Substance Use Topics   Alcohol use: No   Drug use: Yes    Types: PCP     Stuart Vernell Norris, PA-C 04/23/24 1208

## 2024-04-23 NOTE — Discharge Instructions (Signed)
 Trial the statin mouth rinse to see if this helps resolve the tongue pain.  You may apply the lidocaine  solution to a cotton ball and set it on the lesion as needed for pain relief.  Follow-up with dentist if not resolving

## 2024-05-12 ENCOUNTER — Inpatient Hospital Stay: Attending: Hematology and Oncology

## 2024-05-12 VITALS — BP 152/82 | HR 83 | Temp 98.4°F | Resp 18

## 2024-05-12 DIAGNOSIS — Z1731 Human epidermal growth factor receptor 2 positive status: Secondary | ICD-10-CM | POA: Diagnosis not present

## 2024-05-12 DIAGNOSIS — Z1722 Progesterone receptor negative status: Secondary | ICD-10-CM | POA: Insufficient documentation

## 2024-05-12 DIAGNOSIS — Z170 Estrogen receptor positive status [ER+]: Secondary | ICD-10-CM | POA: Insufficient documentation

## 2024-05-12 DIAGNOSIS — Z5112 Encounter for antineoplastic immunotherapy: Secondary | ICD-10-CM | POA: Insufficient documentation

## 2024-05-12 DIAGNOSIS — C50411 Malignant neoplasm of upper-outer quadrant of right female breast: Secondary | ICD-10-CM | POA: Insufficient documentation

## 2024-05-12 DIAGNOSIS — C7951 Secondary malignant neoplasm of bone: Secondary | ICD-10-CM | POA: Diagnosis not present

## 2024-05-12 MED ORDER — DIPHENHYDRAMINE HCL 25 MG PO CAPS
50.0000 mg | ORAL_CAPSULE | Freq: Once | ORAL | Status: AC
  Administered 2024-05-12: 50 mg via ORAL
  Filled 2024-05-12: qty 2

## 2024-05-12 MED ORDER — ACETAMINOPHEN 325 MG PO TABS
650.0000 mg | ORAL_TABLET | Freq: Once | ORAL | Status: AC
  Administered 2024-05-12: 650 mg via ORAL
  Filled 2024-05-12: qty 2

## 2024-05-12 MED ORDER — TRASTUZUMAB-ANNS CHEMO 150 MG IV SOLR
315.0000 mg | Freq: Once | INTRAVENOUS | Status: AC
  Administered 2024-05-12: 315 mg via INTRAVENOUS
  Filled 2024-05-12: qty 15

## 2024-05-12 MED ORDER — SODIUM CHLORIDE 0.9 % IV SOLN: Freq: Once | INTRAVENOUS | Status: AC

## 2024-05-12 NOTE — Patient Instructions (Signed)
 [facility] - A DEPT OF [facility]  Discharge Instructions: Thank you for choosing [facility] to provide your oncology and hematology care.   If you have a lab appointment with the Cancer Center, please go directly to the Cancer Center and check in at the registration area.   Wear comfortable clothing and clothing appropriate for easy access to any Portacath or PICC line.   We strive to give you quality time with your provider. You may need to reschedule your appointment if you arrive late (15 or more minutes).  Arriving late affects you and other patients whose appointments are after yours.  Also, if you miss three or more appointments without notifying the office, you may be dismissed from the clinic at the provider's discretion.      For prescription refill requests, have your pharmacy contact our office and allow 72 hours for refills to be completed.    Today you received the following chemotherapy and/or immunotherapy agents: Trastuzumab -anns (Kanjinti )    To help prevent nausea and vomiting after your treatment, we encourage you to take your nausea medication as directed.  BELOW ARE SYMPTOMS THAT SHOULD BE REPORTED IMMEDIATELY: *FEVER GREATER THAN 100.4 F (38 C) OR HIGHER *CHILLS OR SWEATING *NAUSEA AND VOMITING THAT IS NOT CONTROLLED WITH YOUR NAUSEA MEDICATION *UNUSUAL SHORTNESS OF BREATH *UNUSUAL BRUISING OR BLEEDING *URINARY PROBLEMS (pain or burning when urinating, or frequent urination) *BOWEL PROBLEMS (unusual diarrhea, constipation, pain near the anus) TENDERNESS IN MOUTH AND THROAT WITH OR WITHOUT PRESENCE OF ULCERS (sore throat, sores in mouth, or a toothache) UNUSUAL RASH, SWELLING OR PAIN  UNUSUAL VAGINAL DISCHARGE OR ITCHING   Items with * indicate a potential emergency and should be followed up as soon as possible or go to the Emergency Department if any problems should occur.  Please show the CHEMOTHERAPY ALERT CARD  or IMMUNOTHERAPY ALERT CARD at check-in to the Emergency Department and triage nurse.  Should you have questions after your visit or need to cancel or reschedule your appointment, please contact [facility] - A DEPT OF [facility]  Dept: [phone]  and follow the prompts.  Office hours are 8:00 a.m. to 4:30 p.m. Monday - Friday. Please note that voicemails left after 4:00 p.m. may not be returned until the following business day.  We are closed weekends and major holidays. You have access to a nurse at all times for urgent questions. Please call the main number to the clinic Dept: [phone] and follow the prompts.   For any non-urgent questions, you may also contact your provider using MyChart. We now offer e-Visits for anyone 57 and older to request care online for non-urgent symptoms. For details visit [email or web address].   Also download the MyChart app! Go to the app store, search MyChart, open the app, select [facility], and log in with your MyChart username and password.

## 2024-05-13 ENCOUNTER — Other Ambulatory Visit: Payer: Self-pay | Admitting: Hematology and Oncology

## 2024-05-16 ENCOUNTER — Other Ambulatory Visit: Payer: Self-pay | Admitting: Hematology and Oncology

## 2024-05-17 ENCOUNTER — Encounter: Payer: Self-pay | Admitting: Hematology and Oncology

## 2024-06-04 NOTE — Progress Notes (Signed)
 [name]                                          MRN: [id number]   06/04/2024   The [facility] Quality Team Specialist reviewed this patient medical record for the purposes of chart review for care gap closure. The following were reviewed: abstraction for care gap closure-glycemic status assessment.    [facility] Quality Team

## 2024-06-23 ENCOUNTER — Inpatient Hospital Stay: Attending: Hematology and Oncology | Admitting: Hematology and Oncology

## 2024-06-23 ENCOUNTER — Inpatient Hospital Stay

## 2024-07-20 ENCOUNTER — Ambulatory Visit: Payer: Self-pay | Admitting: Family Medicine

## 2025-01-24 ENCOUNTER — Encounter: Payer: Self-pay | Admitting: Family Medicine

## 2025-03-02 ENCOUNTER — Ambulatory Visit
